# Patient Record
Sex: Female | Born: 1940
Health system: Southern US, Community
[De-identification: ages and names within clinical notes are randomized; demographics above are authoritative.]

## PROBLEM LIST (undated history)

## (undated) DIAGNOSIS — E7800 Pure hypercholesterolemia, unspecified: Secondary | ICD-10-CM

## (undated) DIAGNOSIS — J449 Chronic obstructive pulmonary disease, unspecified: Secondary | ICD-10-CM

## (undated) DIAGNOSIS — T451X5A Adverse effect of antineoplastic and immunosuppressive drugs, initial encounter: Secondary | ICD-10-CM

## (undated) DIAGNOSIS — C539 Malignant neoplasm of cervix uteri, unspecified: Secondary | ICD-10-CM

## (undated) DIAGNOSIS — M1990 Unspecified osteoarthritis, unspecified site: Secondary | ICD-10-CM

## (undated) DIAGNOSIS — C259 Malignant neoplasm of pancreas, unspecified: Secondary | ICD-10-CM

## (undated) DIAGNOSIS — K439 Ventral hernia without obstruction or gangrene: Secondary | ICD-10-CM

## (undated) DIAGNOSIS — D6481 Anemia due to antineoplastic chemotherapy: Secondary | ICD-10-CM

## (undated) DIAGNOSIS — K219 Gastro-esophageal reflux disease without esophagitis: Secondary | ICD-10-CM

## (undated) DIAGNOSIS — I719 Aortic aneurysm of unspecified site, without rupture: Secondary | ICD-10-CM

## (undated) DIAGNOSIS — E538 Deficiency of other specified B group vitamins: Secondary | ICD-10-CM

## (undated) DIAGNOSIS — D649 Anemia, unspecified: Secondary | ICD-10-CM

## (undated) DIAGNOSIS — I10 Essential (primary) hypertension: Secondary | ICD-10-CM

## (undated) DIAGNOSIS — C7800 Secondary malignant neoplasm of unspecified lung: Secondary | ICD-10-CM

## (undated) DIAGNOSIS — Z7189 Other specified counseling: Secondary | ICD-10-CM

## (undated) HISTORY — DX: Malignant neoplasm of pancreas, unspecified: C25.9

## (undated) HISTORY — DX: Anemia due to antineoplastic chemotherapy: D64.81

## (undated) HISTORY — DX: Aortic aneurysm of unspecified site, without rupture: I71.9

## (undated) HISTORY — DX: Other specified counseling: Z71.89

## (undated) HISTORY — PX: THYROID SURGERY: SHX805

## (undated) HISTORY — DX: Deficiency of other specified B group vitamins: E53.8

## (undated) HISTORY — PX: CARDIAC CATHETERIZATION: SHX172

## (undated) HISTORY — DX: Ventral hernia without obstruction or gangrene: K43.9

## (undated) HISTORY — DX: Chronic obstructive pulmonary disease, unspecified: J44.9

## (undated) HISTORY — DX: Secondary malignant neoplasm of unspecified lung: C78.00

## (undated) HISTORY — DX: Adverse effect of antineoplastic and immunosuppressive drugs, initial encounter: T45.1X5A

## (undated) HISTORY — PX: SKIN CANCER EXCISION: SHX779

---
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
## 1961-02-21 DIAGNOSIS — C539 Malignant neoplasm of cervix uteri, unspecified: Secondary | ICD-10-CM

## 1961-02-21 HISTORY — DX: Malignant neoplasm of cervix uteri, unspecified: C53.9

## 1961-02-21 HISTORY — PX: VAGINAL HYSTERECTOMY: SUR661

## 1986-02-21 HISTORY — PX: BREAST CYST EXCISION: SHX579

## 2005-02-21 HISTORY — PX: BUNIONECTOMY: SHX129

## 2005-12-05 ENCOUNTER — Ambulatory Visit: Payer: Self-pay | Admitting: Cardiology

## 2007-02-22 HISTORY — PX: CATARACT EXTRACTION W/ INTRAOCULAR LENS  IMPLANT, BILATERAL: SHX1307

## 2008-08-21 DIAGNOSIS — C259 Malignant neoplasm of pancreas, unspecified: Secondary | ICD-10-CM

## 2008-08-21 DIAGNOSIS — C801 Malignant (primary) neoplasm, unspecified: Secondary | ICD-10-CM

## 2008-08-21 HISTORY — PX: CHOLECYSTECTOMY: SHX55

## 2008-08-21 HISTORY — PX: WHIPPLE PROCEDURE: SHX2667

## 2008-08-21 HISTORY — DX: Malignant neoplasm of pancreas, unspecified: C25.9

## 2008-10-28 ENCOUNTER — Ambulatory Visit (HOSPITAL_COMMUNITY): Admission: RE | Admit: 2008-10-28 | Discharge: 2008-10-28 | Payer: Self-pay | Admitting: Internal Medicine

## 2009-07-08 ENCOUNTER — Ambulatory Visit (HOSPITAL_COMMUNITY): Admission: RE | Admit: 2009-07-08 | Discharge: 2009-07-08 | Payer: Self-pay | Admitting: Internal Medicine

## 2009-12-03 ENCOUNTER — Ambulatory Visit (HOSPITAL_COMMUNITY): Admission: RE | Admit: 2009-12-03 | Discharge: 2009-12-03 | Payer: Self-pay | Admitting: Internal Medicine

## 2009-12-14 ENCOUNTER — Ambulatory Visit: Payer: Self-pay | Admitting: Thoracic Surgery

## 2009-12-24 ENCOUNTER — Inpatient Hospital Stay (HOSPITAL_COMMUNITY): Admission: RE | Admit: 2009-12-24 | Discharge: 2009-12-29 | Payer: Self-pay | Admitting: Thoracic Surgery

## 2009-12-24 ENCOUNTER — Encounter: Payer: Self-pay | Admitting: Thoracic Surgery

## 2009-12-24 ENCOUNTER — Ambulatory Visit: Payer: Self-pay | Admitting: Thoracic Surgery

## 2010-01-06 ENCOUNTER — Ambulatory Visit: Payer: Self-pay | Admitting: Thoracic Surgery

## 2010-01-06 ENCOUNTER — Encounter: Admission: RE | Admit: 2010-01-06 | Discharge: 2010-01-06 | Payer: Self-pay | Admitting: Thoracic Surgery

## 2010-01-27 ENCOUNTER — Ambulatory Visit: Payer: Self-pay | Admitting: Thoracic Surgery

## 2010-03-09 ENCOUNTER — Ambulatory Visit
Admission: RE | Admit: 2010-03-09 | Discharge: 2010-03-09 | Payer: Self-pay | Source: Home / Self Care | Attending: Thoracic Surgery | Admitting: Thoracic Surgery

## 2010-03-09 ENCOUNTER — Encounter
Admission: RE | Admit: 2010-03-09 | Discharge: 2010-03-09 | Payer: Self-pay | Source: Home / Self Care | Attending: Thoracic Surgery | Admitting: Thoracic Surgery

## 2010-03-13 ENCOUNTER — Encounter: Payer: Self-pay | Admitting: Thoracic Surgery

## 2010-03-14 ENCOUNTER — Encounter: Payer: Self-pay | Admitting: Thoracic Surgery

## 2010-05-04 LAB — BASIC METABOLIC PANEL
BUN: 16 mg/dL (ref 6–23)
BUN: 8 mg/dL (ref 6–23)
CO2: 27 mEq/L (ref 19–32)
CO2: 29 mEq/L (ref 19–32)
Chloride: 101 mEq/L (ref 96–112)
Chloride: 102 mEq/L (ref 96–112)
Chloride: 99 mEq/L (ref 96–112)
Creatinine, Ser: 0.82 mg/dL (ref 0.4–1.2)
Creatinine, Ser: 0.89 mg/dL (ref 0.4–1.2)
GFR calc Af Amer: >60 mL/min (ref >60)
Glucose, Bld: 113 mg/dL — ABNORMAL HIGH (ref 70–99)
Potassium: 3.6 mEq/L (ref 3.5–5.1)
Potassium: 3.6 mEq/L (ref 3.5–5.1)
Potassium: 4.2 mEq/L (ref 3.5–5.1)

## 2010-05-04 LAB — TYPE AND SCREEN
ABO/RH(D): A POS
Antibody Screen: NEGATIVE

## 2010-05-04 LAB — GLUCOSE, CAPILLARY
Glucose-Capillary: 119 mg/dL — ABNORMAL HIGH (ref 70–99)
Glucose-Capillary: 126 mg/dL — ABNORMAL HIGH (ref 70–99)
Glucose-Capillary: 132 mg/dL — ABNORMAL HIGH (ref 70–99)
Glucose-Capillary: 132 mg/dL — ABNORMAL HIGH (ref 70–99)
Glucose-Capillary: 132 mg/dL — ABNORMAL HIGH (ref 70–99)
Glucose-Capillary: 133 mg/dL — ABNORMAL HIGH (ref 70–99)
Glucose-Capillary: 144 mg/dL — ABNORMAL HIGH (ref 70–99)
Glucose-Capillary: 147 mg/dL — ABNORMAL HIGH (ref 70–99)
Glucose-Capillary: 163 mg/dL — ABNORMAL HIGH (ref 70–99)
Glucose-Capillary: 191 mg/dL — ABNORMAL HIGH (ref 70–99)
Glucose-Capillary: 195 mg/dL — ABNORMAL HIGH (ref 70–99)

## 2010-05-04 LAB — CBC
HCT: 26.8 % — ABNORMAL LOW (ref 36.0–46.0)
HCT: 28.1 % — ABNORMAL LOW (ref 36.0–46.0)
HCT: 30.2 % — ABNORMAL LOW (ref 36.0–46.0)
Hemoglobin: 10.2 g/dL — ABNORMAL LOW (ref 12.0–15.0)
Hemoglobin: 9.3 g/dL — ABNORMAL LOW (ref 12.0–15.0)
MCH: 29.7 pg (ref 26.0–34.0)
MCH: 29.8 pg (ref 26.0–34.0)
MCH: 29.8 pg (ref 26.0–34.0)
MCH: 30.8 pg (ref 26.0–34.0)
MCHC: 33.1 g/dL (ref 30.0–36.0)
MCHC: 34 g/dL (ref 30.0–36.0)
MCV: 88 fL (ref 78.0–100.0)
MCV: 89.3 fL (ref 78.0–100.0)
MCV: 89.8 fL (ref 78.0–100.0)
MCV: 90.1 fL (ref 78.0–100.0)
Platelets: 103 10*3/uL — ABNORMAL LOW (ref 150–400)
Platelets: 130 10*3/uL — ABNORMAL LOW (ref 150–400)
Platelets: 91 10*3/uL — ABNORMAL LOW (ref 150–400)
RBC: 3.12 MIL/uL — ABNORMAL LOW (ref 3.87–5.11)
RBC: 3.43 MIL/uL — ABNORMAL LOW (ref 3.87–5.11)
RDW: 13.5 % (ref 11.5–15.5)
RDW: 13.6 % (ref 11.5–15.5)
RDW: 14.1 % (ref 11.5–15.5)
RDW: 14.2 % (ref 11.5–15.5)
WBC: 11.5 10*3/uL — ABNORMAL HIGH (ref 4.0–10.5)
WBC: 12 10*3/uL — ABNORMAL HIGH (ref 4.0–10.5)
WBC: 8.7 10*3/uL (ref 4.0–10.5)

## 2010-05-04 LAB — COMPREHENSIVE METABOLIC PANEL
ALT: 11 U/L (ref 0–35)
AST: 17 U/L (ref 0–37)
Albumin: 3.9 g/dL (ref 3.5–5.2)
BUN: 19 mg/dL (ref 6–23)
Calcium: 8.8 mg/dL (ref 8.4–10.5)
Calcium: 9.3 mg/dL (ref 8.4–10.5)
Chloride: 109 mEq/L (ref 96–112)
Creatinine, Ser: 0.95 mg/dL (ref 0.4–1.2)
Creatinine, Ser: 1.21 mg/dL — ABNORMAL HIGH (ref 0.4–1.2)
GFR calc Af Amer: 53 mL/min — ABNORMAL LOW (ref >60)
GFR calc Af Amer: >60 mL/min (ref >60)
Sodium: 134 mEq/L — ABNORMAL LOW (ref 135–145)
Total Bilirubin: 0.5 mg/dL (ref 0.3–1.2)
Total Protein: 5.7 g/dL — ABNORMAL LOW (ref 6.0–8.3)

## 2010-05-04 LAB — BLOOD GAS, ARTERIAL
Bicarbonate: 24.8 mEq/L — ABNORMAL HIGH (ref 20.0–24.0)
Patient temperature: 98.6
TCO2: 25.9 mmol/L (ref 0–100)
pH, Arterial: 7.437 — ABNORMAL HIGH (ref 7.350–7.400)

## 2010-05-04 LAB — POCT I-STAT 3, ART BLOOD GAS (G3+)
Acid-base deficit: 1 mmol/L (ref 0.0–2.0)
Bicarbonate: 23.6 mEq/L (ref 20.0–24.0)
O2 Saturation: 88 %
TCO2: 25 mmol/L (ref 0–100)
pO2, Arterial: 56 mmHg — ABNORMAL LOW (ref 80.0–100.0)

## 2010-05-04 LAB — URINALYSIS, ROUTINE W REFLEX MICROSCOPIC
Glucose, UA: NEGATIVE mg/dL
Ketones, ur: NEGATIVE mg/dL
Protein, ur: NEGATIVE mg/dL
Urobilinogen, UA: 1 mg/dL (ref 0.0–1.0)

## 2010-05-04 LAB — MRSA PCR SCREENING: MRSA by PCR: NEGATIVE

## 2010-05-04 LAB — URINE MICROSCOPIC-ADD ON

## 2010-05-04 LAB — PROTIME-INR: INR: 1.03 (ref 0.00–1.49)

## 2010-05-04 LAB — SURGICAL PCR SCREEN: Staphylococcus aureus: POSITIVE — AB

## 2010-05-04 LAB — APTT: aPTT: 28 seconds (ref 24–37)

## 2010-05-06 LAB — GLUCOSE, CAPILLARY: Glucose-Capillary: 106 mg/dL — ABNORMAL HIGH (ref 70–99)

## 2010-05-26 ENCOUNTER — Ambulatory Visit (INDEPENDENT_AMBULATORY_CARE_PROVIDER_SITE_OTHER): Payer: Medicare Other | Admitting: Thoracic Surgery

## 2010-05-26 DIAGNOSIS — D381 Neoplasm of uncertain behavior of trachea, bronchus and lung: Secondary | ICD-10-CM

## 2010-05-27 NOTE — Letter (Signed)
May 26, 2010  Boris M. Ubaldo Glassing, M.D. [address] [place], [place] [address]  Re:  [name] [name], [name] [name]              DOB:  October 28, 1940  Dear Dr. Ubaldo Glassing,  I saw [name] back in the office today and reviewed her CT scan. There is no evidence of recurrence of her metastatic pancreatic cancer to her right lung, where we had removed 3 nodules; however, on the left side the left lower lobe nodule despite Xeloda and Gemzar chemotherapy has increased from 1 cm to 2.4 cm.  There is another small nodule in the left lower lobe that is still 8 mm that is unchanged.  I plan to proceed with a left VATS and wedge resection of the left lower lobe lesion and we will also take the stable nodule out at the time.  I explained the situation to her and she agrees to the surgery.  We will get pulmonary function tests on her prior to her surgery.  Her blood pressure is 148/83, pulse 74, respirations 18, sats were 96%.  Ines Bloomer, M.D. Electronically Signed  DPB/MEDQ  D:  05/26/2010  T:  05/27/2010  Job:  [id number]

## 2010-05-28 LAB — GLUCOSE, CAPILLARY: Glucose-Capillary: 115 mg/dL — ABNORMAL HIGH (ref 70–99)

## 2010-06-01 ENCOUNTER — Ambulatory Visit (HOSPITAL_COMMUNITY)
Admission: RE | Admit: 2010-06-01 | Discharge: 2010-06-01 | Disposition: A | Discharge status: Home / Self Care | Payer: Medicare Other | Source: Ambulatory Visit | Attending: Thoracic Surgery | Admitting: Thoracic Surgery

## 2010-06-01 ENCOUNTER — Other Ambulatory Visit: Payer: Self-pay | Admitting: Thoracic Surgery

## 2010-06-01 ENCOUNTER — Encounter (HOSPITAL_COMMUNITY)
Admission: RE | Admit: 2010-06-01 | Discharge: 2010-06-01 | Disposition: A | Discharge status: Home / Self Care | Payer: Medicare Other | Source: Ambulatory Visit | Attending: Thoracic Surgery | Admitting: Thoracic Surgery

## 2010-06-01 DIAGNOSIS — R911 Solitary pulmonary nodule: Secondary | ICD-10-CM

## 2010-06-01 DIAGNOSIS — J984 Other disorders of lung: Secondary | ICD-10-CM | POA: Insufficient documentation

## 2010-06-01 LAB — BLOOD GAS, ARTERIAL
Drawn by: 181601
FIO2: 0.21 %
O2 Saturation: 97.1 %
Patient temperature: 98.6
pH, Arterial: 7.423 — ABNORMAL HIGH (ref 7.350–7.400)
pO2, Arterial: 104 mmHg — ABNORMAL HIGH (ref 80.0–100.0)

## 2010-06-01 LAB — URINALYSIS, ROUTINE W REFLEX MICROSCOPIC
Bilirubin Urine: NEGATIVE
Glucose, UA: NEGATIVE mg/dL
Ketones, ur: NEGATIVE mg/dL
Protein, ur: NEGATIVE mg/dL

## 2010-06-01 LAB — COMPREHENSIVE METABOLIC PANEL
ALT: 15 U/L (ref 0–35)
AST: 22 U/L (ref 0–37)
Alkaline Phosphatase: 102 U/L (ref 39–117)
CO2: 22 mEq/L (ref 19–32)
Calcium: 9.4 mg/dL (ref 8.4–10.5)
Chloride: 109 mEq/L (ref 96–112)
GFR calc Af Amer: >60 mL/min (ref >60)
GFR calc non Af Amer: >60 mL/min (ref >60)
Potassium: 4.1 mEq/L (ref 3.5–5.1)
Sodium: 140 mEq/L (ref 135–145)

## 2010-06-01 LAB — TYPE AND SCREEN: Antibody Screen: NEGATIVE

## 2010-06-01 LAB — PROTIME-INR
INR: 1.07 (ref 0.00–1.49)
Prothrombin Time: 14.1 seconds (ref 11.6–15.2)

## 2010-06-01 LAB — CBC
MCH: 32.1 pg (ref 26.0–34.0)
MCHC: 33.4 g/dL (ref 30.0–36.0)
Platelets: 121 10*3/uL — ABNORMAL LOW (ref 150–400)
RDW: 12.9 % (ref 11.5–15.5)

## 2010-06-01 LAB — URINE MICROSCOPIC-ADD ON

## 2010-06-03 ENCOUNTER — Inpatient Hospital Stay (HOSPITAL_COMMUNITY)
Admission: RE | Admit: 2010-06-03 | Discharge: 2010-06-07 | DRG: 164 | Disposition: A | Discharge status: Home / Self Care | Payer: Medicare Other | Source: Ambulatory Visit | Attending: Thoracic Surgery | Admitting: Thoracic Surgery

## 2010-06-03 ENCOUNTER — Other Ambulatory Visit: Payer: Self-pay | Admitting: Thoracic Surgery

## 2010-06-03 ENCOUNTER — Inpatient Hospital Stay (HOSPITAL_COMMUNITY): Payer: Medicare Other

## 2010-06-03 DIAGNOSIS — C259 Malignant neoplasm of pancreas, unspecified: Secondary | ICD-10-CM | POA: Diagnosis present

## 2010-06-03 DIAGNOSIS — C7800 Secondary malignant neoplasm of unspecified lung: Principal | ICD-10-CM | POA: Diagnosis present

## 2010-06-03 DIAGNOSIS — J449 Chronic obstructive pulmonary disease, unspecified: Secondary | ICD-10-CM | POA: Diagnosis present

## 2010-06-03 DIAGNOSIS — I10 Essential (primary) hypertension: Secondary | ICD-10-CM | POA: Diagnosis present

## 2010-06-03 DIAGNOSIS — J4489 Other specified chronic obstructive pulmonary disease: Secondary | ICD-10-CM | POA: Diagnosis present

## 2010-06-03 DIAGNOSIS — E785 Hyperlipidemia, unspecified: Secondary | ICD-10-CM | POA: Diagnosis present

## 2010-06-03 DIAGNOSIS — Z87891 Personal history of nicotine dependence: Secondary | ICD-10-CM

## 2010-06-03 DIAGNOSIS — D381 Neoplasm of uncertain behavior of trachea, bronchus and lung: Secondary | ICD-10-CM

## 2010-06-03 DIAGNOSIS — Z7982 Long term (current) use of aspirin: Secondary | ICD-10-CM

## 2010-06-03 DIAGNOSIS — D696 Thrombocytopenia, unspecified: Secondary | ICD-10-CM | POA: Diagnosis present

## 2010-06-03 LAB — GLUCOSE, CAPILLARY: Glucose-Capillary: 161 mg/dL — ABNORMAL HIGH (ref 70–99)

## 2010-06-04 ENCOUNTER — Inpatient Hospital Stay (HOSPITAL_COMMUNITY): Payer: Medicare Other

## 2010-06-04 LAB — CBC
MCV: 95.6 fL (ref 78.0–100.0)
Platelets: 91 10*3/uL — ABNORMAL LOW (ref 150–400)
RBC: 3.16 MIL/uL — ABNORMAL LOW (ref 3.87–5.11)
WBC: 8.1 10*3/uL (ref 4.0–10.5)

## 2010-06-04 LAB — GLUCOSE, CAPILLARY
Glucose-Capillary: 152 mg/dL — ABNORMAL HIGH (ref 70–99)
Glucose-Capillary: 124 mg/dL — ABNORMAL HIGH (ref 70–99)
Glucose-Capillary: 194 mg/dL — ABNORMAL HIGH (ref 70–99)

## 2010-06-04 LAB — POCT I-STAT 3, ART BLOOD GAS (G3+)
Bicarbonate: 27.5 mEq/L — ABNORMAL HIGH (ref 20.0–24.0)
TCO2: 29 mmol/L (ref 0–100)
pCO2 arterial: 43.1 mmHg (ref 35.0–45.0)
pH, Arterial: 7.413 — ABNORMAL HIGH (ref 7.350–7.400)

## 2010-06-04 LAB — BASIC METABOLIC PANEL
BUN: 10 mg/dL (ref 6–23)
Chloride: 108 mEq/L (ref 96–112)
Potassium: 3.4 mEq/L — ABNORMAL LOW (ref 3.5–5.1)
Sodium: 137 mEq/L (ref 135–145)

## 2010-06-05 ENCOUNTER — Inpatient Hospital Stay (HOSPITAL_COMMUNITY): Payer: Medicare Other

## 2010-06-05 LAB — COMPREHENSIVE METABOLIC PANEL
CO2: 28 mEq/L (ref 19–32)
Calcium: 8.9 mg/dL (ref 8.4–10.5)
Chloride: 105 mEq/L (ref 96–112)
Creatinine, Ser: 0.97 mg/dL (ref 0.4–1.2)
GFR calc non Af Amer: 56 mL/min — ABNORMAL LOW (ref >60)
Glucose, Bld: 118 mg/dL — ABNORMAL HIGH (ref 70–99)
Total Bilirubin: 0.5 mg/dL (ref 0.3–1.2)

## 2010-06-05 LAB — CBC
HCT: 28.4 % — ABNORMAL LOW (ref 36.0–46.0)
Hemoglobin: 9.3 g/dL — ABNORMAL LOW (ref 12.0–15.0)
MCH: 31.6 pg (ref 26.0–34.0)
MCHC: 32.7 g/dL (ref 30.0–36.0)
MCV: 96.6 fL (ref 78.0–100.0)
RBC: 2.94 MIL/uL — ABNORMAL LOW (ref 3.87–5.11)

## 2010-06-05 LAB — GLUCOSE, CAPILLARY: Glucose-Capillary: 137 mg/dL — ABNORMAL HIGH (ref 70–99)

## 2010-06-06 ENCOUNTER — Inpatient Hospital Stay (HOSPITAL_COMMUNITY): Payer: Medicare Other

## 2010-06-06 LAB — GLUCOSE, CAPILLARY
Glucose-Capillary: 183 mg/dL — ABNORMAL HIGH (ref 70–99)
Glucose-Capillary: 153 mg/dL — ABNORMAL HIGH (ref 70–99)

## 2010-06-07 ENCOUNTER — Inpatient Hospital Stay (HOSPITAL_COMMUNITY): Payer: Medicare Other

## 2010-06-07 LAB — GLUCOSE, CAPILLARY
Glucose-Capillary: 107 mg/dL — ABNORMAL HIGH (ref 70–99)
Glucose-Capillary: 139 mg/dL — ABNORMAL HIGH (ref 70–99)

## 2010-06-09 NOTE — Discharge Summary (Signed)
NAME:  [name] [name], [name] [name] NO.:  [id number]  MEDICAL RECORD NO.:  [id number]           PATIENT TYPE:  I  LOCATION:  3303                         FACILITY:  [facility]  PHYSICIAN:  Ines Bloomer, M.D. DATE OF BIRTH:  [birth year]  DATE OF ADMISSION:  06/03/2010 DATE OF DISCHARGE:  06/07/2010                              DISCHARGE SUMMARY   FINAL DIAGNOSES: 1. Enlarging left lower lobe lesion. 2. History of metastatic adenocarcinoma from the pancreas.  SECONDARY DIAGNOSES: 1. History of adenocarcinoma of the right upper and right lower lobes     consistent with metastatic adenocarcinoma. 2. History of pancreatic cancer status post Whipple procedure done by     Dr. Eustace Moore approximately November 2010. 3. History of small abdominal aortic aneurysm. 4. Ventral hernia status post Whipple. 5. Hyperlipidemia. 6. Chronic obstructive pulmonary disease. 7. Hypertension. 8. History of tobacco abuse. 9. Status post right VATS with right upper lobe and right lower lobe     wedge resection.  IN-HOSPITAL OPERATIONS AND PROCEDURES:  Left video-assisted thoracoscopic surgery with left mini thoracotomy, wedge resection left lower lobe x4.  THE PATIENT'S HISTORY AND PHYSICAL AND HOSPITAL COURSE:  The patient is a 70 year old female who had a Whipple procedure done a year and a half ago and found to have multiple nodules in the lung with 8 to 10 mm in the right lower lobe and left lower lobe.  In October 2011, she underwent right VATS and wedge resection 3 nodules in the right side that turned out to be metastatic adenocarcinoma.  The patient was placed on chemotherapy by Dr. Derald Macleod including Xeloda.  Her CA-19 was normal. Unfortunately the left lower lobe lesion had progressed up to about 2 cm and there is another small stable lesion 6 to 8 mm in the lateral segment of the left lower lobe.  The patient had been seen and evaluated by Dr. Edwyna Shell and Dr. Edwyna Shell  discussed the patient undergoing a left VATS with resection of these nodules.  He discussed risks and benefits with the patient.  The patient acknowledged understanding and agreed to proceed.  Surgery was scheduled for June 03, 2010.  For further details of the patient's past medical history and physical exam, please see dictated H and P.  The patient was taken to the operating room on June 03, 2010, where she underwent left video-assisted thoracoscopic surgery with left mini thoracotomy, wedge resection left lower lobe x4.  This came back positive for adenocarcinoma consistent with metastatic features.  The patient tolerated this procedure well and was transferred to the intensive care unit in stable condition.  She was able to be extubated following surgery.  Postextubation she was noted to be alert and oriented x4.  Neuro intact.  The patient was noted to be hemodynamically stable postoperatively.  During the patient's postoperative course, daily chest x-rays were obtained.  The patient had no air leak from chest tube.  One chest tube was able to be discontinued postop day #2. Remaining chest tube placed to water seal.  This showed no air leaks. Chest x-ray showed tiny left apical pneumothorax.  Remaining chest tube  was discontinued postop day #3.  Following x-ray PA and lateral on June 07, 2010, showed stable tiny left apical pneumothorax with persistent atelectasis.  During this time, the patient was encouraged to use her incentive spirometer.  She is saturating greater than 90% on room air at rest and with ambulation into the low 80s.  It is felt due to desaturation with ambulation, the patient will require home oxygen. This was set up prior to discharge.  The patient will require 2 L oxygen with ambulation.  She is to continue using her incentive spirometer. Home health nurse was also set up.  During this time, the patient has remained afebrile.  She has remained in normal  sinus rhythm.  Blood pressure stable.  All incisions clean, dry, and intact and healing well. She is tolerating diet well.  No nausea, vomiting noted.  All incisions are clean, dry, and intact and healing well.  Most recent lab work shows sodium of 139, potassium 4.0, chloride of 105, bicarbonate 20, BUN 9, creatinine 0.97, glucose 118.  White blood cell count 8.4, hemoglobin 9.3, hematocrit 20.4, platelet count 92.  The patient is felt to be stable and ready for discharge to home today with oxygen.  FOLLOWUP APPOINTMENTS:  A followup appointment will be arranged with Dr. Edwyna Shell for 1 week.  Our office will contact the patient with this information.  She will need to obtain PA and lateral chest x-ray 45 minutes prior to this appointment.  ACTIVITY:  The patient instructed no driving until released to do so, no lifting over 10 pounds.  She is told to ambulate 3-4 times per day, progress as tolerated, and continue her breathing exercises.  INCISIONAL CARE:  The patient is told to shower washing her incisions using soap and water.  She is to contact the office if she develops any drainage or opening from any of her incision sites.  DIET:  The patient educated on diet to be low-fat, low-salt.  DISCHARGE MEDICATIONS: 1. Percocet 5/325 one to two tablets q.4-6 h. p.r.n. pain. 2. Norvasc 5 mg daily. 3. Enteric-coated aspirin 81 mg daily. 4. Losartan 50 mg daily. 5. Metoprolol XL 50 mg half tablet daily. 6. Prilosec 20 mg b.i.d. 7. Simvastatin 20 mg daily at night. 8. Stool softener daily p.r.n.     Sol Blazing, PA   ______________________________ Ines Bloomer, M.D.    KMD/MEDQ  D:  06/07/2010  T:  06/08/2010  Job:  [id number]  Electronically Signed by Cameron Proud PA on 06/08/2010 01:20:53 PM Electronically Signed by Jovita Gamma M.D. on 06/09/2010 02:15:02 PM

## 2010-06-09 NOTE — H&P (Signed)
NAME:  LOUKISHA, GUNNERSON NO.:  [id number]  MEDICAL RECORD NO.:  [id number]           PATIENT TYPE:  O  LOCATION:  XRAY                         FACILITY:  [facility]  PHYSICIAN:  Ines Bloomer, M.D. DATE OF BIRTH:  08/15/1940  DATE OF ADMISSION:  06/01/2010 DATE OF DISCHARGE:  06/01/2010                             HISTORY & PHYSICAL   CHIEF COMPLAINT:  Enlarging left lower lobe lesion.  HISTORY OF PRESENT ILLNESS:  This 70 year old Caucasian female had a Whipple procedure done in a year and a half ago, was found to have multiple nodules in the lung with 8-10 mm in the right lower lobe and left lower lobe nodule.  In October 2011, she underwent a right VATS and resection of 3 nodules in the right side that turned out to be metastatic adenocarcinoma.  She was placed on chemotherapy by Dr. Derald Macleod including Xeloda and unfortunately her CA-19 was normal. Unfortunately the left lower lobe lesion had progressed up to about 2 cm and there is another small stable lesion of 6-8 mm in the lateral segment of the left lower lobe.  She is being admitted for resection of the left lower lobe lesion.  PAST MEDICAL HISTORY:  Significant for abdominal wall hernia, small aortic aneurysm.  Her pulmonary function tests showed an FVC of 3.05 with a FEV-1 of 2.3 or 95% predicted and a corrected diffusion capacity of 48%.  Her medications include Prilosec, Toprol, Zocor, Norvasc, stool softener, losartan, aspirin.  She has no allergies.  She also has a dyslipidemia, chronic obstructive pulmonary disease, and hypertension.  FAMILY HISTORY:  Positive for pancreatic cancer.  Brother died of pancreatic cancer.  SOCIAL HISTORY:  She has no children.  She quit smoking in 1996.  Does not drink alcohol on a regular basis.  REVIEW OF SYSTEMS:  She is 5 feet 5 inches, 154 pounds, her weight has been stable.  CARDIAC:  There is no angina or atrial fibrillation. PULMONARY:  There is no  hemoptysis.  PULMONARY:  There is no hemoptysis or bronchitis.  GI:  See history of present illness.  GU:  No kidney disease, dysuria, or frequent urination.  VASCULAR:  No claudication, DVT, or TIAs.  NEUROLOGICAL:  No dizziness, headaches, blackouts, or seizures.  MUSCULOSKELETAL:  Arthritis.  PSYCHIATRIC:  No depression or nervousness.  ENT:  No change in eyesight or hearing.  HEMATOLOGICAL: No problems with bleeding, clotting disorders, or anemia.  PHYSICAL EXAMINATION:  GENERAL:  She is a well-developed Caucasian female in no acute distress. VITAL SIGNS:  Blood pressure is 148/83, pulse 74, respirations 20, sats were 96%. HEAD:  Atraumatic. EYES:  Pupils equal and reactive to light and accommodation. Extraocular movements normal. EARS:  Tympanic membranes are cracked. NOSE:  There is no septal deviation. THROAT:  Without lesion.  Uvula is in midline. NECK:  Supple without thyromegaly.  No supraclavicular or axillary adenopathy. CHEST:  Clear to auscultation and percussion. HEART:  Regular sinus rhythm.  No murmurs. ABDOMEN:  Soft.  There is no hepatosplenomegaly. EXTREMITIES:  Pulse is 2+.  There is no clubbing or edema. NEUROLOGICAL:  She is oriented x3.  Sensory and motor intact.  Cranial nerves intact.  IMPRESSION:  Status post Whipple disease versus pancreatic cancer, metastatic adenocarcinoma of the pancreas to the right status post resection, enlarging left lower lobe lesion, hypercholesterolemia, chronic obstructive pulmonary disease, hypertension.  PLAN:  Left VATS, resection of 2 left lower lobe nodules.     Ines Bloomer, M.D.     DPB/MEDQ  D:  06/01/2010  T:  06/02/2010  Job:  [id number]  Electronically Signed by Jovita Gamma M.D. on 06/09/2010 02:14:51 PM

## 2010-06-09 NOTE — Op Note (Signed)
  NAME:  [name] [name], [name] [name] NO.:  [id number]  MEDICAL RECORD NO.:  [id number]           PATIENT TYPE:  LOCATION:                                 FACILITY:  PHYSICIAN:  Ines Bloomer, M.D. DATE OF BIRTH:  05/31/1940  DATE OF PROCEDURE: DATE OF DISCHARGE:                              OPERATIVE REPORT   PREOPERATIVE DIAGNOSIS:  Enlarging left lower lobe lesion, history of metastatic adenocarcinoma from the pancreas.  POSTOPERATIVE DIAGNOSIS:  Enlarging left lower lobe lesion, history of metastatic adenocarcinoma from the pancreas.  OPERATION PERFORMED:  Left video-assisted thoracoscopic surgery, resection of left lower lobe lesion.  After general anesthesia, the patient was turned to the left lateral thoracotomy position.  Dual-lumen tube was inserted and the left lung was deflated.  Two trocar sites were made in the anterior and posterior axillary line at the seventh intercostal space and the midaxillary lineat the sixth intercostal space.  Two trocars were inserted inferiorly and a 0-degree scope was inserted.  The lesion was seen in the posterolateral segment of the left lower lobe, and we grasped it through the anterior trocar site with a lung clamp and then came in posteriorly with Covidien stapler and resected the lesion.  The patient had two other small lesions and one other small lesion in the lung and we palpated those and they appeared to be intrapulmonary lymph nodes but we resected both of those again grasping with [facility] lung clamp and then resected it with the Covidien 45 stapler.  The margin unfortunately came back positive or a questionable positive, so we took a secondary margin with Covidien stapler another 1.5 to 2 cm.  A 24 chest tube was brought through the anterior trocar site and sutured in place with 0 silk.  A 28 right-angle was brought through the posterior trocar sites and sutured in place with 0 silk.  A Marcaine block was then used in  the usual fashion.  A single On-Q was inserted in the usual fashion.  The chest was closed with two pericostal drilling through the seventh rib and passed around the sixth rib with #1 Vicryl in the muscle layer and Dermabond for the skin.  The patient was returned to recovery room in stable condition.     Ines Bloomer, M.D.     DPB/MEDQ  D:  06/03/2010  T:  06/03/2010  Job:  [id number]  Electronically Signed by Jovita Gamma M.D. on 06/09/2010 02:14:54 PM

## 2010-06-11 DIAGNOSIS — C3490 Malignant neoplasm of unspecified part of unspecified bronchus or lung: Secondary | ICD-10-CM | POA: Insufficient documentation

## 2010-06-14 ENCOUNTER — Other Ambulatory Visit: Payer: Self-pay | Admitting: Thoracic Surgery

## 2010-06-14 DIAGNOSIS — D381 Neoplasm of uncertain behavior of trachea, bronchus and lung: Secondary | ICD-10-CM

## 2010-06-15 ENCOUNTER — Ambulatory Visit (INDEPENDENT_AMBULATORY_CARE_PROVIDER_SITE_OTHER): Payer: Self-pay | Admitting: Thoracic Surgery

## 2010-06-15 ENCOUNTER — Ambulatory Visit
Admission: RE | Admit: 2010-06-15 | Discharge: 2010-06-15 | Disposition: A | Discharge status: Home / Self Care | Payer: Medicare Other | Source: Ambulatory Visit | Attending: Thoracic Surgery | Admitting: Thoracic Surgery

## 2010-06-15 DIAGNOSIS — C3490 Malignant neoplasm of unspecified part of unspecified bronchus or lung: Secondary | ICD-10-CM

## 2010-06-15 DIAGNOSIS — D381 Neoplasm of uncertain behavior of trachea, bronchus and lung: Secondary | ICD-10-CM

## 2010-06-16 NOTE — Assessment & Plan Note (Signed)
OFFICE VISIT  PARILEE, HALLY DOB:  1940/10/21                                        June 15, 2010 CHART #:  [id number]  The patient came today.  Her blood pressure is 150/83, pulse 70, respirations 18, sats were 93%.  She is doing well overall.  Her incisions are well healed, removed her chest tube sutures.  The pathology on the left side was questionable, lung cancer and they are doing some extra studies and I do not have the final path report and they want to compare this with their previous studies at [facility].  We told her that she can start driving next week and gradually increase her activities and I will see her back again in 3 weeks with a chest x-ray.  Ines Bloomer, M.D. Electronically Signed  DPB/MEDQ  D:  06/15/2010  T:  06/16/2010  Job:  [id number]

## 2010-07-05 ENCOUNTER — Other Ambulatory Visit: Payer: Self-pay | Admitting: Thoracic Surgery

## 2010-07-05 DIAGNOSIS — C3430 Malignant neoplasm of lower lobe, unspecified bronchus or lung: Secondary | ICD-10-CM

## 2010-07-06 ENCOUNTER — Ambulatory Visit (INDEPENDENT_AMBULATORY_CARE_PROVIDER_SITE_OTHER): Payer: Self-pay | Admitting: Thoracic Surgery

## 2010-07-06 ENCOUNTER — Ambulatory Visit
Admission: RE | Admit: 2010-07-06 | Discharge: 2010-07-06 | Disposition: A | Discharge status: Home / Self Care | Payer: Medicare Other | Source: Ambulatory Visit | Attending: Thoracic Surgery | Admitting: Thoracic Surgery

## 2010-07-06 DIAGNOSIS — C3490 Malignant neoplasm of unspecified part of unspecified bronchus or lung: Secondary | ICD-10-CM

## 2010-07-06 DIAGNOSIS — C3430 Malignant neoplasm of lower lobe, unspecified bronchus or lung: Secondary | ICD-10-CM

## 2010-07-06 NOTE — Letter (Signed)
December 15, 2009   Normand Sloop, MD  [address]  [place], [place]  [address]   Re:  [name] [name], [name] [name]              DOB:  1940/09/02   Dear Dr. Derald Macleod:   This 70 year old Caucasian female was referred here because of changes  on her CT scan.  She has a significant history of having pancreatic  cancer after developing jaundice and underwent Whipple procedure by Dr.  Eustace Moore approximately 1 year ago.  Recent CT scan showed  multiple small nodules in her lungs, largest being about 8 mm in the  right lower lobe and the left lower lobe showed some cavitation.  PET  scan was positive from these lesions, shows no evidence of any other  recurrence of her cancer.  She is referred here for possible biopsy  because of her positive PET scan.  Her CA19-9 is normal and her liver  function tests were also normal.  There is no other area of uptake other  than in her PET scan.  On November 26, 2009, there was also a question of  some small liver metastasis.   PAST MEDICAL HISTORY:  She has abdominal wall hernia secondary to her  surgery and a small abdominal aortic aneurysm.  These lesions are too  small to be biopsied by interventional radiology.   MEDICATIONS:  Prilosec 40 mg a day, Toprol 50 mg half tablet daily,  Zocor, and Norvasc 5 mg a day.   ALLERGIES:  She has no allergies.   She has dyslipidemia, hypercholesteremia, chronic obstructive pulmonary  disease, and hypertension.   FAMILY HISTORY:  Positive for pancreatic cancer.  Her brother died of  pancreatic cancer.   SOCIAL HISTORY:  She is married, has no children.  Comes in today with  her sister.  Quit smoking in 1996.  Does not drink alcohol.   REVIEW OF SYSTEMS:  She is 154 pounds.  She is 5 feet 5 inches.  GENERAL:  Her weight is stable.  CARDIAC:  No angina or atrial fibrillation.  PULMONARY:  No hemoptysis, bronchitis.  GI:  See history of present illness.  GU:  No kidney disease, dysuria or frequent  urination.  VASCULAR:  No claudication, DVT or TIAs.  NEUROLOGICAL:  No dizziness, headaches, blackouts.  MUSCULOSKELETAL:  No arthritis.  PSYCHIATRIC:  No depression or nervousness.  EYE/ENT:  No change in eyesight or hearing.  HEMATOLOGICAL:  No problems with bleeding, clotting disorders or anemia.   PHYSICAL EXAMINATION:  She is a well-developed Caucasian female in no  acute distress.  Her blood pressure is 190/95, pulse 74, respirations  18, saturations were 98%.  Head, Eyes, Ears, Nose and Throat:  Unremarkable.  Neck:  Supple without thyromegaly.  There is no  supraclavicular or axillary adenopathy.  Chest:  Clear to auscultation  and percussion.  Heart:  Regular sinus rhythm.  Abdomen:  There is a  ventral hernia and surgical scars.  Bowel sounds are normal.  Extremities:  Pulses are 2+.  There is no clubbing or edema.  Neurological:  She is oriented x3.  Sensory and motor intact.  Cranial  nerves are intact.   I feel that these lesions are very small and the only way to retain that  is with a VATS biopsy.  Plan to do a right VATS on her on November 11 at  [facility].  Discussed the risk and benefits of the procedure and she  agrees to  procedure.  We will inform you of her operative findings   Ines Bloomer, M.D.  Electronically Signed   DPB/MEDQ  D:  12/15/2009  T:  12/16/2009  Job:  [id number]

## 2010-07-06 NOTE — Assessment & Plan Note (Signed)
OFFICE VISIT   [name], [name] [name]  DOB:  01/22/1941                                        March 09, 2010  CHART #:  [id number]   The patient is undergoing chemotherapy for metastatic adenocarcinoma of  the colon.  Her VATS incisions are well healed.  Her chest x-ray is  stable.  Her blood pressure was 154/76, pulse 69, respirations 16, sats  were 97%.  I released to full activity.  She looks great.  We will be  happy to see her again if she has any future problems.   Ines Bloomer, M.D.  Electronically Signed   DPB/MEDQ  D:  03/09/2010  T:  03/10/2010  Job:  [id number]

## 2010-07-06 NOTE — Letter (Signed)
January 06, 2010   [name] [name]. Darovsky, M.D.  [address] [place], [place] [address]   Re:  [name], [name]              DOB:  14-Jan-1941   Dear Dr. Ubaldo Glassing:   The patient came to the office today.  Her blood pressure was 144/79,  pulse 80, respirations 18, saturations were 96%.  Her chest x-ray showed  resolution of her effusions.  Her incisions are well healed.  Removed  her chest tube sutures and told to gradually increase her activities.  I  will see her back in 3 weeks with a chest x-ray and she will see Dr.  Ubaldo Glassing in the near future.   Ines Bloomer, M.D.  Electronically Signed   DPB/MEDQ  D:  01/06/2010  T:  01/06/2010  Job:  [id number]   cc:   Kirstie Peri, MD

## 2010-07-07 NOTE — Assessment & Plan Note (Signed)
OFFICE VISIT  [name] [name], [name] [name] DOB:  1940/08/22                                        Jul 06, 2010 CHART #:  [id number]  The patient returns today.  Her blood pressure is 154/81, pulse 88, respirations 20 and sats were 98%.  We stopped her oxygen.  We will have that picked up.  Her incisions are well-healed.  Her chest x-ray showed further improvement.  We planned just a decrease in her effusion.  We planned to see her back again in 2 months with a chest x-ray and then put her on a regular followup.  Ines Bloomer, M.D. Electronically Signed  DPB/MEDQ  D:  07/06/2010  T:  07/07/2010  Job:  [id number]  cc:   Lebron Conners. Darovsky, M.D.

## 2010-09-07 ENCOUNTER — Other Ambulatory Visit: Payer: Self-pay | Admitting: Thoracic Surgery

## 2010-09-07 ENCOUNTER — Ambulatory Visit
Admission: RE | Admit: 2010-09-07 | Discharge: 2010-09-07 | Disposition: A | Discharge status: Home / Self Care | Payer: Medicare Other | Source: Ambulatory Visit | Attending: Thoracic Surgery | Admitting: Thoracic Surgery

## 2010-09-07 ENCOUNTER — Ambulatory Visit (INDEPENDENT_AMBULATORY_CARE_PROVIDER_SITE_OTHER): Payer: Medicare Other | Admitting: Thoracic Surgery

## 2010-09-07 DIAGNOSIS — C3430 Malignant neoplasm of lower lobe, unspecified bronchus or lung: Secondary | ICD-10-CM

## 2010-09-07 DIAGNOSIS — C3490 Malignant neoplasm of unspecified part of unspecified bronchus or lung: Secondary | ICD-10-CM

## 2010-09-07 NOTE — Letter (Signed)
September 07, 2010  Boris Judie Petit. Darovsky, MD [address] [place], [place]  [address].  Re:  [name] [name], [name] [name]              DOB:  [birth year]  The patient comes in today and her chest x-ray shows resolution of pleural effusion, just postoperative changes.  She is doing well overall and is back to full activities.  Apparently she will be seen in a couple of weeks and then hopefully will get a followup CT scan in 2-3 months. I will release her back CU, but we happy to see her at anytime.  Her blood pressure is 140/82, pulse 84, respirations 18, and sats were 98% on room air.  Ines Bloomer, M.D. Electronically Signed  DPB/MEDQ  D:  09/07/2010  T:  09/07/2010  Job:  [id number]

## 2010-12-29 ENCOUNTER — Other Ambulatory Visit (HOSPITAL_COMMUNITY): Payer: Self-pay | Admitting: Internal Medicine

## 2010-12-29 DIAGNOSIS — C259 Malignant neoplasm of pancreas, unspecified: Secondary | ICD-10-CM

## 2011-02-01 ENCOUNTER — Encounter (HOSPITAL_COMMUNITY): Payer: Self-pay

## 2011-02-01 ENCOUNTER — Encounter (HOSPITAL_COMMUNITY)
Admission: RE | Admit: 2011-02-01 | Discharge: 2011-02-01 | Disposition: A | Discharge status: Home / Self Care | Payer: Medicare Other | Source: Ambulatory Visit | Attending: Internal Medicine | Admitting: Internal Medicine

## 2011-02-01 DIAGNOSIS — C259 Malignant neoplasm of pancreas, unspecified: Secondary | ICD-10-CM | POA: Insufficient documentation

## 2011-02-01 DIAGNOSIS — J984 Other disorders of lung: Secondary | ICD-10-CM | POA: Insufficient documentation

## 2011-02-01 DIAGNOSIS — J9819 Other pulmonary collapse: Secondary | ICD-10-CM | POA: Insufficient documentation

## 2011-02-01 LAB — GLUCOSE, CAPILLARY: Glucose-Capillary: 111 mg/dL — ABNORMAL HIGH (ref 70–99)

## 2011-02-01 MED ORDER — FLUDEOXYGLUCOSE F - 18 (FDG) INJECTION
18.0000 | Freq: Once | INTRAVENOUS | Status: AC | PRN
  Administered 2011-02-01: 18 via INTRAVENOUS

## 2011-02-25 ENCOUNTER — Other Ambulatory Visit (HOSPITAL_COMMUNITY): Payer: Self-pay | Admitting: Internal Medicine

## 2011-02-25 DIAGNOSIS — C259 Malignant neoplasm of pancreas, unspecified: Secondary | ICD-10-CM

## 2011-02-25 DIAGNOSIS — C787 Secondary malignant neoplasm of liver and intrahepatic bile duct: Secondary | ICD-10-CM

## 2011-02-25 DIAGNOSIS — C3490 Malignant neoplasm of unspecified part of unspecified bronchus or lung: Secondary | ICD-10-CM

## 2011-03-01 ENCOUNTER — Ambulatory Visit
Admission: RE | Admit: 2011-03-01 | Discharge: 2011-03-01 | Disposition: A | Discharge status: Home / Self Care | Payer: Medicare Other | Source: Ambulatory Visit | Attending: Internal Medicine | Admitting: Internal Medicine

## 2011-03-01 DIAGNOSIS — C259 Malignant neoplasm of pancreas, unspecified: Secondary | ICD-10-CM

## 2011-03-01 DIAGNOSIS — C3490 Malignant neoplasm of unspecified part of unspecified bronchus or lung: Secondary | ICD-10-CM

## 2011-03-01 HISTORY — DX: Malignant neoplasm of cervix uteri, unspecified: C53.9

## 2011-03-01 HISTORY — DX: Essential (primary) hypertension: I10

## 2011-03-01 HISTORY — DX: Pure hypercholesterolemia, unspecified: E78.00

## 2011-03-03 ENCOUNTER — Other Ambulatory Visit (HOSPITAL_COMMUNITY): Payer: Self-pay | Admitting: Interventional Radiology

## 2011-03-03 DIAGNOSIS — K769 Liver disease, unspecified: Secondary | ICD-10-CM

## 2011-03-04 ENCOUNTER — Other Ambulatory Visit: Payer: Self-pay | Admitting: Radiology

## 2011-03-04 ENCOUNTER — Encounter (HOSPITAL_COMMUNITY): Payer: Self-pay

## 2011-03-08 ENCOUNTER — Ambulatory Visit (HOSPITAL_COMMUNITY)
Admission: RE | Admit: 2011-03-08 | Discharge: 2011-03-08 | Disposition: A | Discharge status: Home / Self Care | Payer: Medicare Other | Source: Ambulatory Visit | Attending: Interventional Radiology | Admitting: Interventional Radiology

## 2011-03-08 ENCOUNTER — Encounter (HOSPITAL_COMMUNITY): Payer: Self-pay

## 2011-03-08 DIAGNOSIS — I10 Essential (primary) hypertension: Secondary | ICD-10-CM | POA: Insufficient documentation

## 2011-03-08 DIAGNOSIS — Z7982 Long term (current) use of aspirin: Secondary | ICD-10-CM | POA: Insufficient documentation

## 2011-03-08 DIAGNOSIS — Z79899 Other long term (current) drug therapy: Secondary | ICD-10-CM | POA: Insufficient documentation

## 2011-03-08 DIAGNOSIS — Z90710 Acquired absence of both cervix and uterus: Secondary | ICD-10-CM | POA: Insufficient documentation

## 2011-03-08 DIAGNOSIS — Z8541 Personal history of malignant neoplasm of cervix uteri: Secondary | ICD-10-CM | POA: Insufficient documentation

## 2011-03-08 DIAGNOSIS — K7689 Other specified diseases of liver: Secondary | ICD-10-CM | POA: Insufficient documentation

## 2011-03-08 DIAGNOSIS — E7800 Pure hypercholesterolemia, unspecified: Secondary | ICD-10-CM | POA: Insufficient documentation

## 2011-03-08 DIAGNOSIS — Z8509 Personal history of malignant neoplasm of other digestive organs: Secondary | ICD-10-CM | POA: Insufficient documentation

## 2011-03-08 DIAGNOSIS — Z9089 Acquired absence of other organs: Secondary | ICD-10-CM | POA: Insufficient documentation

## 2011-03-08 DIAGNOSIS — K769 Liver disease, unspecified: Secondary | ICD-10-CM

## 2011-03-08 LAB — CBC
MCH: 30.2 pg (ref 26.0–34.0)
MCHC: 33.4 g/dL (ref 30.0–36.0)
MCV: 90.5 fL (ref 78.0–100.0)
Platelets: 106 10*3/uL — ABNORMAL LOW (ref 150–400)
RBC: 3.77 MIL/uL — ABNORMAL LOW (ref 3.87–5.11)
RDW: 13.7 % (ref 11.5–15.5)

## 2011-03-08 LAB — PROTIME-INR: Prothrombin Time: 14.5 seconds (ref 11.6–15.2)

## 2011-03-08 MED ORDER — FENTANYL CITRATE 0.05 MG/ML IJ SOLN
INTRAMUSCULAR | Status: AC
  Filled 2011-03-08: qty 6

## 2011-03-08 MED ORDER — MIDAZOLAM HCL 2 MG/2ML IJ SOLN
INTRAMUSCULAR | Status: AC
  Filled 2011-03-08: qty 6

## 2011-03-08 MED ORDER — FENTANYL CITRATE 0.05 MG/ML IJ SOLN
INTRAMUSCULAR | Status: AC | PRN
  Administered 2011-03-08: 100 ug via INTRAVENOUS
  Administered 2011-03-08: 50 ug via INTRAVENOUS

## 2011-03-08 MED ORDER — HEPARIN SOD (PORK) LOCK FLUSH 100 UNIT/ML IV SOLN
INTRAVENOUS | Status: AC
  Administered 2011-03-08: 500 [IU]
  Filled 2011-03-08: qty 5

## 2011-03-08 MED ORDER — HEPARIN SOD (PORK) LOCK FLUSH 100 UNIT/ML IV SOLN
500.0000 [IU] | INTRAVENOUS | Status: AC | PRN
  Administered 2011-03-08: 500 [IU]

## 2011-03-08 MED ORDER — SODIUM CHLORIDE 0.9 % IV SOLN
INTRAVENOUS | Status: DC
  Administered 2011-03-08: 500 mL via INTRAVENOUS

## 2011-03-08 MED ORDER — MIDAZOLAM HCL 5 MG/5ML IJ SOLN
INTRAMUSCULAR | Status: AC | PRN
  Administered 2011-03-08: 2 mg via INTRAVENOUS
  Administered 2011-03-08: 1 mg via INTRAVENOUS

## 2011-03-08 NOTE — Procedures (Signed)
Procedure:  CT guided liver biopsy Findings:  Inferior right lobe liver lesion core bx.  18G cores x 3 via 17G needle. Plan:  3 hr recovery

## 2011-03-08 NOTE — H&P (Signed)
[name] [name] is an 71 y.o. female.   Chief Complaint: Liver lesion HPI: Hx of Pancreatic ca; lung ca; cervical ca Scheduled for liver lesion biopsy today  Past Medical History  Diagnosis Date  . Cancer July 2010    Pancreatic adenocarcinoma  . Cervical cancer 1963  . Emphysema   . Hypertension   . Hypercholesterolemia     Past Surgical History  Procedure Date  . Vaginal hysterectomy 1963    Cervical Cancer  . Tonsillectomy 1943  . Whipple procedure July 2010    pancreatic adenocarcinoma  . Cholecystectomy July 2010    done w/ Whipple procedure  . Breast surgery 1988    Right breast abcess  . Bunionectomy 2007  . Eye surgery 2009    Bilateral cataract    History reviewed. No pertinent family history. Social History:  reports that she quit smoking about 15 years ago. Her smoking use included Cigarettes. She has a 30 pack-year smoking history. She has never used smokeless tobacco. She reports that she does not drink alcohol or use illicit drugs.  Allergies: No Known Allergies  Medications Prior to Admission  Medication Sig Dispense Refill  . aspirin 81 MG tablet Take 81 mg by mouth daily before breakfast.       . losartan (COZAAR) 100 MG tablet Take 100 mg by mouth daily before breakfast.       . metoprolol succinate (TOPROL-XL) 50 MG 24 hr tablet Take 25 mg by mouth daily before breakfast. Take with or immediately following a meal.      . naproxen sodium (ANAPROX) 220 MG tablet Take 220 mg by mouth 2 (two) times daily as needed. For pain.      Marland Kitchen omeprazole (PRILOSEC) 20 MG capsule Take 20 mg by mouth 2 (two) times daily.       . simvastatin (ZOCOR) 20 MG tablet Take 20 mg by mouth every evening.       Medications Prior to Admission  Medication Dose Route Frequency Provider Last Rate Last Dose  . 0.9 %  sodium chloride infusion   Intravenous Continuous Robet Leu, PA        No results found for this or any previous visit (from the past 48 hour(s)). No results  found.  Review of Systems  Constitutional: Negative for fever and chills.  Respiratory: Negative for cough.   Cardiovascular: Negative for chest pain.  Gastrointestinal: Negative for nausea, vomiting and abdominal pain.  Neurological: Negative for headaches.    There were no vitals taken for this visit. Physical Exam  Constitutional: She is oriented to person, place, and time. She appears well-developed and well-nourished.  HENT:  Head: Normocephalic and atraumatic.  Eyes: EOM are normal.  Neck: Normal range of motion. Neck supple.  Cardiovascular: Normal rate, regular rhythm and normal heart sounds.   No murmur heard. Respiratory: Effort normal and breath sounds normal. She has no wheezes.  GI: Soft. Bowel sounds are normal.  Musculoskeletal: Normal range of motion.  Neurological: She is alert and oriented to person, place, and time.  Skin: Skin is warm and dry.     Assessment/Plan Pt with hx of pancreatic ca; lung ca; cervical ca; now with liver lesions. Scheduled for liver lesion biopsy Aware of procedure benefits and risks and agreeable to proceed. Consent signed.  Seabron Iannello A 03/08/2011, 8:33 AM

## 2011-03-08 NOTE — ED Notes (Signed)
Samples being collected

## 2011-03-08 NOTE — Progress Notes (Signed)
Ambulated to BR with assist and voided without problem

## 2011-03-08 NOTE — H&P (Signed)
Agree 

## 2011-03-11 ENCOUNTER — Ambulatory Visit (HOSPITAL_COMMUNITY): Payer: Medicare Other

## 2011-03-11 ENCOUNTER — Other Ambulatory Visit (HOSPITAL_COMMUNITY): Payer: Medicare Other

## 2011-03-15 ENCOUNTER — Other Ambulatory Visit (HOSPITAL_COMMUNITY): Payer: Medicare Other

## 2011-03-17 ENCOUNTER — Encounter: Payer: Self-pay | Admitting: *Deleted

## 2011-03-17 DIAGNOSIS — I10 Essential (primary) hypertension: Secondary | ICD-10-CM | POA: Insufficient documentation

## 2011-03-17 DIAGNOSIS — J439 Emphysema, unspecified: Secondary | ICD-10-CM | POA: Insufficient documentation

## 2011-03-17 DIAGNOSIS — C539 Malignant neoplasm of cervix uteri, unspecified: Secondary | ICD-10-CM | POA: Insufficient documentation

## 2011-03-17 NOTE — Progress Notes (Signed)
New consult  Lung  Cancer for possible stereotatic radiotherapy  Hx Metastatic  Pancreatic cancer,Cervical Cancer age 71,   Chemotherapy regimen,Gemzar 08/2008-06/2009 Folfax  For 5 cycles begun 11/2009 Recently  Widowed, brother deceased pancreatic cancer,no children   Allergies: NKDA

## 2011-03-18 ENCOUNTER — Ambulatory Visit
Admission: RE | Admit: 2011-03-18 | Discharge: 2011-03-18 | Disposition: A | Discharge status: Home / Self Care | Payer: Medicare Other | Source: Ambulatory Visit | Attending: Radiation Oncology | Admitting: Radiation Oncology

## 2011-03-18 ENCOUNTER — Encounter: Payer: Self-pay | Admitting: Radiation Oncology

## 2011-03-18 VITALS — BP 188/78 | HR 60 | Temp 98.1°F | Resp 20 | Ht 65.0 in | Wt 158.1 lb

## 2011-03-18 DIAGNOSIS — C3490 Malignant neoplasm of unspecified part of unspecified bronchus or lung: Secondary | ICD-10-CM

## 2011-03-18 DIAGNOSIS — Z510 Encounter for antineoplastic radiation therapy: Secondary | ICD-10-CM | POA: Insufficient documentation

## 2011-03-18 DIAGNOSIS — Z79899 Other long term (current) drug therapy: Secondary | ICD-10-CM | POA: Insufficient documentation

## 2011-03-18 DIAGNOSIS — C3410 Malignant neoplasm of upper lobe, unspecified bronchus or lung: Secondary | ICD-10-CM | POA: Insufficient documentation

## 2011-03-18 DIAGNOSIS — Z7982 Long term (current) use of aspirin: Secondary | ICD-10-CM | POA: Insufficient documentation

## 2011-03-18 NOTE — Progress Notes (Signed)
Please see the Nurse Progress Note in the MD Initial Consult Encounter for this patient. 

## 2011-03-18 NOTE — Progress Notes (Signed)
Met with patient to discuss RO billing.  Rad Tx: [id number] SRS Robot  Attending Rad: Dr. Kathrynn Running  Dx: 162.9 Lung, NOs

## 2011-03-20 ENCOUNTER — Encounter: Payer: Self-pay | Admitting: Radiation Oncology

## 2011-03-20 NOTE — Progress Notes (Signed)
Radiation Oncology         [phone] ________________________________  Name: [name] [name] MRN: [id number]  Date: 03/20/2011  DOB: [birth year]       Follow-Up Visit Note  CC: [name] Peri, MD, MD  [name] Reading, MD  Diagnosis:   71 year old woman with a clinical stage IA non-small cell carcinoma left upper lobe lung-stage I  Narrative . . . . . . . . The patient returns today to coordinate further therapy. I met with her in consultation and the [facility] cancer [facility] he needn't a few weeks ago. At that time, we talked to her about the treatment of the PET positive left upper lung nodule suspected to represent a new early stage primary lung cancer. The patient also had a background history of pancreatic cancer and cervical cancer. She's also had a early stage lung cancer. At that time the dose speaking with her, she had a larger liver lesion which was of concern for potential malignancy. She underwent CT-guided biopsy of the liver which revealed benign tissue suggestive of a liver abscess. Accordingly, this did not require any further intervention at that time. She has kindly been referred back for treatment of the left upper lung nodule with stereotactic body radiotherapy.                         ALLERGIES:   has no known allergies.  Meds. . . . . . . . . . . . Current Outpatient Prescriptions  Medication Sig Dispense Refill  . hydrochlorothiazide (HYDRODIURIL) 25 MG tablet Take 25 mg by mouth daily.      Marland Kitchen losartan (COZAAR) 100 MG tablet Take 100 mg by mouth daily before breakfast.       . metoprolol succinate (TOPROL-XL) 50 MG 24 hr tablet Take 25 mg by mouth daily before breakfast. Take with or immediately following a meal.      . naproxen sodium (ANAPROX) 220 MG tablet Take 220 mg by mouth 2 (two) times daily as needed. For pain.      Marland Kitchen omeprazole (PRILOSEC) 20 MG capsule Take 20 mg by mouth 2 (two) times daily.       . simvastatin (ZOCOR) 20 MG tablet Take 20 mg by mouth  every evening.      Marland Kitchen aspirin 81 MG tablet Take 81 mg by mouth daily before breakfast.         Physical Findings. . .  height is 5\' 5"  (1.651 m) and weight is 158 lb 1.6 oz (71.714 kg). Her oral temperature is 98.1 F (36.7 C). Her blood pressure is 188/78 and her pulse is 60. Her respiration is 20 and oxygen saturation is 98%. .  The patient is in no acute distress today. She is alert and oriented. Head is normocephalic and atraumatic. Extraocular muscles are intact. Motor strength is 5/5 nipple discharge. Respiratory effort is unremarkable. Speech is fluent articulate. Gait is unremarkable. Mood is appropriate.  Lab Findings . . . . .  Lab Results  Component Value Date   WBC 6.7 03/08/2011   HGB 11.4* 03/08/2011   HCT 34.1* 03/08/2011   MCV 90.5 03/08/2011   PLT 106* 03/08/2011    X-Ray Findings. . . . Ct Biopsy  03/08/2011  *RADIOLOGY REPORT*  Clinical Data: History of pancreatic carcinoma.  New hypermetabolic lesion in the inferior right lobe of the liver.  The patient presents for biopsy for tissue confirmation of malignancy prior to potential percutaneous ablation  of the liver.  CT GUIDED CORE BIOPSY OF LIVER  Sedation:   3.0 mg IV Versed;  150 mcg IV Fentanyl  Total Moderate Sedation Time: 20 minutes.  Procedure:  The procedure risks, benefits, and alternatives were explained to the patient.  Questions regarding the procedure were encouraged and answered.  The patient understands and consents to the procedure.  The right abdominal wall was prepped with betadine in a sterile fashion, and a sterile drape was applied covering the operative field.  A sterile gown and sterile gloves were used for the procedure.  Local anesthesia was provided with 1% Lidocaine.  CT was performed in a supine position.  Under CT guidance, a 17 gauge needle was advanced to the level of a right hepatic lesion. After confirming needle position, coaxial core biopsy samples were obtained with an 18 gauge device.  Three  separate core samples were submitted in formalin.  Complications: None  Findings: Relatively low attenuation lesion corresponding to the increased metabolic activity by PET scan was localized in the inferior right lobe.  Solid core tissue samples were obtained.  IMPRESSION: CT guided  core biopsy performed of a lesion in the right lobe of the liver.  Original Report Authenticated By: Reola Calkins, M.D.   [facility]  03/07/2011  *RADIOLOGY REPORT*  NEW PATIENT OFFICE VISIT - LEVEL III [phone]):  03/01/2011  [name] [name], M.D. [facility] [address] [place], [place]  [address]  RE:  [name] [name] (DOB: [birth year])  Dear Jeb Levering:  Thank you for sending Mrs. [name] [name] for consultation regarding possible use of percutaneous ablation to treat a liver lesion.  The patient is a 71 year old female with a history of pancreatic adenocarcinoma diagnosed in July of 2010.  This was initially treated with a Whipple procedure.  The patient received Gemcitabine through May of 2011.  FOLFOX chemotherapy was begun October of 2011.  Metastatic pulmonary nodules of the right upper lobe and right lower lobe were resected by Dr. Dewayne Shorter on 12/24/09 with pathology demonstrating adenocarcinoma consistent with metastatic pancreatic adenocarcinoma.  She had additional resection on 06/03/10 of a left lower lobe nodule by Dr. Edwyna Shell which was thought to represent a primary lung adenocarcinoma.  PET scan on 02/01/11  demonstrated increased metabolic activity in a left upper lobe pulmonary nodule and in the right lobe of the liver.  The patient denies any current abdominal complaints.  Past Medical History:     1.    Cervical carcinoma at the age of 48.  Status post       hysterectomy. 2.    History of hypertension.  The patient is followed by Dr. Sherryll Burger. 3.    History of elevated cholesterol. 4.    History of gastroesophageal reflux. 5.    History of emphysema. 6.    Prior right breast abscess.   Medications:  Omeprazole 20 mg twice daily, Toprol 25 mg half pill daily, Zocor 20 mg daily, Losartan 100 mg daily, aspirin 81 mg daily.  Allergies:  No known drug allergies.  Social History:  The patient is widowed.  Her husband recently died on 02/03/01 after complications related to redo CABG.  She lives in [place] and has no children.  She is retired and previously worked at [employer].  She denies alcohol use.  She stopped smoking in 1998.  Family History:  Hypertension, diabetes and lung disease in multiple family members.  Review of Systems:  General:  5'5" in height.  160 pounds in weight.  Vision:  Prior bilateral cataract removal. Gastrointestinal:  No nausea, vomiting or diarrhea.  Genitourinary: No urinary symptoms.  Cardiovascular:  No chest pain or palpitations.  Musculoskeletal:  No muscle or joint pain. Neurologic:  No neurologic symptoms.  Respiratory:  No cough or shortness of breath.  Exam:  Vital signs:  Blood pressure 191/96, pulse 63, respirations 14, temperature 98.2, oxygen saturation 99 percent on room air. Chest:  Clear to auscultation bilaterally.  Heart:  Regular rate and rhythm.  Abdomen:  Soft and nontender.  No distention. Extremities:  No edema.  Labs:  BUN 9, creatinine 0.97, total bilirubin 0.5, alkaline phosphatase 85, AST 24, ALT 14, total protein 5.4, albumin 2.9, WBC 8.4, hemoglobin 11.3, hematocrit 33.8, platelet count 121.  Imaging:  Prior imaging was independently reviewed.  Assessment:  I met with Mrs. Vosler and her sister.  We reviewed imaging findings.  The recent PET scan demonstrates a focal area of hypermetabolic activity in the inferior right lobe of the liver. The corresponding unenhanced CT images demonstrate a fairly geographic area of low attenuation in this region.  Although this is concerning for metastatic disease given PET activity, this is an unusual appearing lesion and I have recommended percutaneous biopsy of the lesion prior to determining whether ablation  would be appropriate.  It does not appear that the patient has any other metastatic lesions in the liver.  Details of percutaneous thermal ablation were discussed with the patient including risks.  She was given additional literature to read.  If this is an isolated metastatic lesion in the liver, it would be amenable to percutaneous microwave ablation.  This would involve general anesthesia and overnight observation at [facility].  After discussion, the patient would like to proceed with biopsy of the lesion.  Given that it is very visible by unenhanced CT, I have recommended CT guidance.  This will be scheduled soon as an outpatient.  I will forward biopsy results to you.  If positive for metastatic malignancy, we will begin the scheduling process for percutaneous ablation of the liver.  Thank you again for sending Mrs. Ditter for consultation and allowing me to participate in her care.  Sincerely,  Irish Lack, M.D. Lavone NianKirstie [name], M.D.      Dewayne Shorter, M.D.  Original Report Authenticated By: Reola Calkins, M.D.    Impression . . . . . . . The patient has a solitary nodule of left upper lung which is positive on PET CT. She has a history of early stage lung cancer. Her case was reviewed with thoracic surgery. She is not felt to be an ideal candidate for surgical intervention at this time given previous lung surgeries. She also not felt to be a candidate for CT-guided biopsy given a potential risk for lung injury including pneumothorax. In this setting, she is a eligible for stereotactic body radiotherapy with curative intent.  Plan . . . . . . . . . . . . today, I spent time with the patient reviewing the findings and workup thus far. We also discussed the role of radiation therapy management of early stage non-small cell lung cancer. We discussed the details related to stereotactic body radiotherapy including the body positioning, the percentage and radiation delivery, and followup  care. The patient would like to proceed with stereotactic body radiotherapy today and provided informed written consent. We'll move towards the CT-based treatment planning today and proceed with radiation treatment during  the next week or so.  _____________________________________  Artist Pais. Kathrynn Running, M.D.

## 2011-03-20 NOTE — Progress Notes (Signed)
  Radiation Oncology         ([phone]) [phone] ________________________________  Name: RACHAL DVORSKY MRN: [id number]  Date: 03/20/2011  DOB: 04-26-1940  Stereotactic body radiotherapy SIMULATION AND TREATMENT PLANNING NOTE  DIAGNOSIS:  71 year old woman with clinical stage IA non-small cell carcinoma left upper lobe lung  NARRATIVE:  The patient was brought to the CT Simulation planning suite.  Identity was confirmed.  All relevant records and images related to the planned course of therapy were reviewed.  The patient freely provided informed written consent to proceed with treatment after reviewing the details related to the planned course of therapy. The consent form was witnessed and verified by the simulation staff.  Then, the patient was set-up in a stable reproducible  supine position for radiation therapy.  CT images were obtained.  Surface markings were placed.  The CT images were loaded into the planning software.  A 4-dimensional CT study set was obtained to account for respiratory excursion. Using cine motion of the CT study set as well as a maximum intensity projection (MIP) study set, the target and avoidance structures were contoured.  Treatment planning then occurred.  The radiation prescription was entered and confirmed.  A total of 1 complex treatment device was fabricated. This was a body fix immobilization below with a compression panel on the upper abdomen to help provide some stabilization of respiratory excursion. I have requested : 3D Simulation  I have requested a DVH of the following structures: Heart, lungs, chest wall, great vessels, trachea, esophagus, and target.   PLAN:  The patient will receive 54 Gy in 3 fractions.  ________________________________  Artist Pais Kathrynn Running, M.D.

## 2011-03-31 ENCOUNTER — Ambulatory Visit
Admission: RE | Admit: 2011-03-31 | Discharge: 2011-03-31 | Disposition: A | Discharge status: Home / Self Care | Payer: Medicare Other | Source: Ambulatory Visit | Attending: Radiation Oncology | Admitting: Radiation Oncology

## 2011-03-31 DIAGNOSIS — C3490 Malignant neoplasm of unspecified part of unspecified bronchus or lung: Secondary | ICD-10-CM

## 2011-04-04 ENCOUNTER — Ambulatory Visit
Admission: RE | Admit: 2011-04-04 | Discharge: 2011-04-04 | Disposition: A | Discharge status: Home / Self Care | Payer: Medicare Other | Source: Ambulatory Visit | Attending: Radiation Oncology | Admitting: Radiation Oncology

## 2011-04-04 DIAGNOSIS — C3490 Malignant neoplasm of unspecified part of unspecified bronchus or lung: Secondary | ICD-10-CM

## 2011-04-07 ENCOUNTER — Ambulatory Visit
Admission: RE | Admit: 2011-04-07 | Discharge: 2011-04-07 | Disposition: A | Discharge status: Home / Self Care | Payer: Medicare Other | Source: Ambulatory Visit | Attending: Radiation Oncology | Admitting: Radiation Oncology

## 2011-04-07 ENCOUNTER — Encounter: Payer: Self-pay | Admitting: Radiation Oncology

## 2011-04-07 ENCOUNTER — Ambulatory Visit: Payer: Medicare Other

## 2011-04-07 VITALS — BP 133/77 | HR 64 | Resp 18 | Wt 160.9 lb

## 2011-04-07 DIAGNOSIS — C3490 Malignant neoplasm of unspecified part of unspecified bronchus or lung: Secondary | ICD-10-CM

## 2011-04-07 NOTE — Progress Notes (Signed)
  Radiation Oncology         [phone] ________________________________  Name: [name] [name] MRN: [id number]  Date: 04/07/2011  DOB: [birth year]  Weekly Radiation Therapy Management  Current Dose: 54 Gy     Planned Dose:  54 Gy  Narrative . . . . . . . . The patient presents for routine under treatment assessment.                                                      The patient is without complaint.                                 Set-up films were reviewed.                                 The chart was checked. Physical Findings. . . Weight essentially stable.  No significant changes. Impression . . . . . . . The patient is  tolerating radiation. Plan . . . . . . . . . . . Marland Kitchen complete treatment as planned.  ________________________________  Artist Pais. Kathrynn Running, M.D.

## 2011-04-07 NOTE — Progress Notes (Signed)
Patient presents to the clinic today accompanied by a family member for an under treat visit with Dr. Kathrynn Running. Today is this patient's final treatment. Provided patient with an appointment card to return in one month for follow up. Encouraged patient to contact our staff with needs. Patient is alert and oriented to person, place, and time. No distress noted. Steady gait noted. Pleasant affect noted. Patient denies pain at this time. Patient denies nausea, vomiting, headache, or diarrhea. Reported all findings to Dr. Kathrynn Running.

## 2011-04-08 ENCOUNTER — Ambulatory Visit: Payer: Medicare Other

## 2011-04-12 ENCOUNTER — Encounter: Payer: Self-pay | Admitting: Radiation Oncology

## 2011-04-12 NOTE — Progress Notes (Signed)
   Department of Radiation Oncology  Phone:  [phone] Fax:        [phone]   ________________________________  Stereotactic Body Radiotherapy Treatment Procedure Note   Name: [name] [name] MRN: [id number]  Date: 04/12/2011 DOB: March 31, 1940   SPECIAL TREATMENT PROCEDURE   NARRATIVE: [name] [name] was brought to the TrueBeam stereotactic radiation treatment machine and placed supine on the CT couch. The patient was set up for stereotactic body radiotherapy on the body fix pillow.   SIMULATION VERIFICATION: The patient underwent cone beam CT imaging. These were carefully aligned and repeated to confirm treatment position.   SPECIAL TREATMENT PROCEDURE: [name] [name] received stereotactic body radiotherapy Left upper lung target using 2 Rapid Arc VMAT Beams to a prescription dose of 18 Gy for her 2nd fraction.   STEREOTACTIC TREATMENT MANAGEMENT: Following delivery, the patient was evaluated clinically. The patient tolerated  treatment without significant acute effects, and was discharged to home in stable condition.   PLAN: Continue treatment as planned.  ________________________________  Artist Pais. Kathrynn Running, M.D.

## 2011-04-12 NOTE — Progress Notes (Signed)
   Department of Radiation Oncology  Phone:  [phone] Fax:        [phone]  Stereotactic Body Radiotherapy Treatment Procedure Note   Name: [name] [name]       MRN: [id number]  Date: 04/07/2011                  DOB: 04-28-1940   SPECIAL TREATMENT PROCEDURE   NARRATIVE: [name] [name] was brought to the TrueBeam stereotactic radiation treatment machine and placed supine on the CT couch. The patient was set up for stereotactic body radiotherapy on the body fix pillow.   SIMULATION VERIFICATION: The patient underwent cone beam CT imaging. These were carefully aligned and repeated to confirm treatment position.   SPECIAL TREATMENT PROCEDURE: [name] [name] received stereotactic body radiotherapy Left upper lung target using 2 Rapid Arc VMAT Beams to a prescription dose of 18 Gy.   STEREOTACTIC TREATMENT MANAGEMENT: Following delivery, the patient was evaluated clinically. The patient tolerated treatment without significant acute effects, and was discharged to home in stable condition.   PLAN: Complete treatment as planned.  ________________________________  Artist Pais. Kathrynn Running, M.D.

## 2011-04-12 NOTE — Progress Notes (Signed)
  Radiation Oncology         [phone] ________________________________  Name: DEEANNE [name] MRN: [id number]  Date: 04/12/2011  DOB: Jul 22, 1940  End of Treatment Note  Diagnosis:   71 year old woman with clinical stage IA non-small cell carcinoma left upper lobe lung  Indication for treatment:  Curative       Radiation treatment dates:   2/7, 11, and 14/2013  Site/dose:   54 gray in 3 fractions of 18 gray   Beams/energy:    stereotactic body radiotherapy was delivered using rapid ARC with 6 megavolt photons delivered in the flattening filter free beam mode. The 80% isodose line was prescribed the patient was set up daily with cone beam CT imaging.  Narrative: The patient tolerated radiation treatment relatively well.   She experienced no acute toxicities during radiation.  Plan: The patient has completed radiation treatment. The patient will return to radiation oncology clinic for routine followup in one month. I advised them to call or return sooner if they have any questions or concerns related to their recovery or treatment. ________________________________  Artist Pais. Kathrynn Running, M.D.

## 2011-04-12 NOTE — Progress Notes (Signed)
  Radiation Oncology         ([phone]) [phone] ________________________________  Stereotactic Body Radiotherapy Treatment Procedure Note  Name: [name] [name] MRN: [id number]  Date: 04/12/2011  DOB: November 12, 1940  SPECIAL TREATMENT PROCEDURE  NARRATIVE:  [name] [name] was brought to the TrueBeam stereotactic radiation treatment machine and placed supine on the CT couch. The patient was set up for stereotactic body radiotherapy on the body fix pillow.  SIMULATION VERIFICATION:  The patient underwent cone beam CT imaging.  These were carefully aligned and repeated to confirm treatment position.  SPECIAL TREATMENT PROCEDURE: [name] [name] received stereotactic body radiotherapy Left upper lung target using 2 Rapid Arc VMAT Beams to a prescription dose of 18 Gy.    STEREOTACTIC TREATMENT MANAGEMENT:  Following delivery, the patient was evaluated clinically. The patient tolerated treatment without significant acute effects, and was discharged to home in stable condition.    PLAN: Continue treatment as planned.  ________________________________  Artist Pais. Kathrynn Running, M.D.

## 2011-05-05 ENCOUNTER — Ambulatory Visit: Payer: Medicare Other | Admitting: Radiation Oncology

## 2011-07-06 ENCOUNTER — Encounter: Payer: Medicare Other | Admitting: Internal Medicine

## 2011-07-12 NOTE — Progress Notes (Signed)
Encounter addended by: Wasif Simonich Bruner Starlee Corralejo, RN on: 07/12/2011 11:08 AM<BR>     Documentation filed: Charges VN

## 2011-07-19 ENCOUNTER — Encounter: Payer: Medicare Other | Admitting: Internal Medicine

## 2011-07-19 DIAGNOSIS — C3490 Malignant neoplasm of unspecified part of unspecified bronchus or lung: Secondary | ICD-10-CM

## 2011-07-19 DIAGNOSIS — C259 Malignant neoplasm of pancreas, unspecified: Secondary | ICD-10-CM

## 2011-08-24 DIAGNOSIS — Z452 Encounter for adjustment and management of vascular access device: Secondary | ICD-10-CM

## 2011-08-24 DIAGNOSIS — C259 Malignant neoplasm of pancreas, unspecified: Secondary | ICD-10-CM

## 2011-09-08 ENCOUNTER — Encounter: Payer: Medicare Other | Admitting: Internal Medicine

## 2011-09-08 DIAGNOSIS — G893 Neoplasm related pain (acute) (chronic): Secondary | ICD-10-CM

## 2011-09-08 DIAGNOSIS — C259 Malignant neoplasm of pancreas, unspecified: Secondary | ICD-10-CM

## 2011-09-08 DIAGNOSIS — C3490 Malignant neoplasm of unspecified part of unspecified bronchus or lung: Secondary | ICD-10-CM

## 2011-09-13 ENCOUNTER — Encounter: Payer: Medicare Other | Admitting: Internal Medicine

## 2011-09-13 DIAGNOSIS — R079 Chest pain, unspecified: Secondary | ICD-10-CM

## 2011-09-13 DIAGNOSIS — C259 Malignant neoplasm of pancreas, unspecified: Secondary | ICD-10-CM

## 2011-09-13 DIAGNOSIS — C3490 Malignant neoplasm of unspecified part of unspecified bronchus or lung: Secondary | ICD-10-CM

## 2011-10-05 ENCOUNTER — Encounter: Payer: Medicare Other | Admitting: Internal Medicine

## 2011-10-05 ENCOUNTER — Other Ambulatory Visit (HOSPITAL_COMMUNITY): Payer: Self-pay | Admitting: Internal Medicine

## 2011-10-05 DIAGNOSIS — C3490 Malignant neoplasm of unspecified part of unspecified bronchus or lung: Secondary | ICD-10-CM

## 2011-10-05 DIAGNOSIS — C259 Malignant neoplasm of pancreas, unspecified: Secondary | ICD-10-CM

## 2011-11-16 DIAGNOSIS — C259 Malignant neoplasm of pancreas, unspecified: Secondary | ICD-10-CM

## 2011-11-16 DIAGNOSIS — Z452 Encounter for adjustment and management of vascular access device: Secondary | ICD-10-CM

## 2011-12-28 DIAGNOSIS — C259 Malignant neoplasm of pancreas, unspecified: Secondary | ICD-10-CM

## 2011-12-28 DIAGNOSIS — C3430 Malignant neoplasm of lower lobe, unspecified bronchus or lung: Secondary | ICD-10-CM

## 2011-12-28 DIAGNOSIS — Z452 Encounter for adjustment and management of vascular access device: Secondary | ICD-10-CM

## 2012-02-06 ENCOUNTER — Encounter (HOSPITAL_COMMUNITY)
Admission: RE | Admit: 2012-02-06 | Discharge: 2012-02-06 | Disposition: A | Discharge status: Home / Self Care | Payer: Medicare Other | Source: Ambulatory Visit | Attending: Internal Medicine | Admitting: Internal Medicine

## 2012-02-06 DIAGNOSIS — N289 Disorder of kidney and ureter, unspecified: Secondary | ICD-10-CM | POA: Insufficient documentation

## 2012-02-06 DIAGNOSIS — I517 Cardiomegaly: Secondary | ICD-10-CM | POA: Insufficient documentation

## 2012-02-06 DIAGNOSIS — D1779 Benign lipomatous neoplasm of other sites: Secondary | ICD-10-CM | POA: Insufficient documentation

## 2012-02-06 DIAGNOSIS — C7800 Secondary malignant neoplasm of unspecified lung: Secondary | ICD-10-CM | POA: Insufficient documentation

## 2012-02-06 DIAGNOSIS — Z4789 Encounter for other orthopedic aftercare: Secondary | ICD-10-CM | POA: Insufficient documentation

## 2012-02-06 DIAGNOSIS — J438 Other emphysema: Secondary | ICD-10-CM | POA: Insufficient documentation

## 2012-02-06 DIAGNOSIS — I2510 Atherosclerotic heart disease of native coronary artery without angina pectoris: Secondary | ICD-10-CM | POA: Insufficient documentation

## 2012-02-06 DIAGNOSIS — C3490 Malignant neoplasm of unspecified part of unspecified bronchus or lung: Secondary | ICD-10-CM

## 2012-02-06 DIAGNOSIS — I714 Abdominal aortic aneurysm, without rupture, unspecified: Secondary | ICD-10-CM | POA: Insufficient documentation

## 2012-02-06 DIAGNOSIS — C259 Malignant neoplasm of pancreas, unspecified: Secondary | ICD-10-CM | POA: Insufficient documentation

## 2012-02-06 DIAGNOSIS — R918 Other nonspecific abnormal finding of lung field: Secondary | ICD-10-CM | POA: Insufficient documentation

## 2012-02-06 DIAGNOSIS — Z90710 Acquired absence of both cervix and uterus: Secondary | ICD-10-CM | POA: Insufficient documentation

## 2012-02-06 DIAGNOSIS — M899 Disorder of bone, unspecified: Secondary | ICD-10-CM | POA: Insufficient documentation

## 2012-02-06 MED ORDER — FLUDEOXYGLUCOSE F - 18 (FDG) INJECTION
19.6000 | Freq: Once | INTRAVENOUS | Status: AC | PRN
  Administered 2012-02-06: 19.6 via INTRAVENOUS

## 2012-02-07 DIAGNOSIS — K219 Gastro-esophageal reflux disease without esophagitis: Secondary | ICD-10-CM

## 2012-02-07 DIAGNOSIS — C3490 Malignant neoplasm of unspecified part of unspecified bronchus or lung: Secondary | ICD-10-CM

## 2012-02-07 DIAGNOSIS — C259 Malignant neoplasm of pancreas, unspecified: Secondary | ICD-10-CM

## 2012-02-07 DIAGNOSIS — I10 Essential (primary) hypertension: Secondary | ICD-10-CM

## 2012-04-03 DIAGNOSIS — C259 Malignant neoplasm of pancreas, unspecified: Secondary | ICD-10-CM

## 2012-04-03 DIAGNOSIS — Z452 Encounter for adjustment and management of vascular access device: Secondary | ICD-10-CM

## 2012-06-05 ENCOUNTER — Encounter: Payer: Medicare Other | Admitting: Internal Medicine

## 2012-06-05 DIAGNOSIS — C3490 Malignant neoplasm of unspecified part of unspecified bronchus or lung: Secondary | ICD-10-CM

## 2012-06-05 DIAGNOSIS — C259 Malignant neoplasm of pancreas, unspecified: Secondary | ICD-10-CM

## 2012-06-05 DIAGNOSIS — R918 Other nonspecific abnormal finding of lung field: Secondary | ICD-10-CM

## 2012-06-25 DIAGNOSIS — C3490 Malignant neoplasm of unspecified part of unspecified bronchus or lung: Secondary | ICD-10-CM

## 2012-06-25 DIAGNOSIS — J329 Chronic sinusitis, unspecified: Secondary | ICD-10-CM

## 2012-06-25 DIAGNOSIS — R42 Dizziness and giddiness: Secondary | ICD-10-CM

## 2012-07-17 DIAGNOSIS — C259 Malignant neoplasm of pancreas, unspecified: Secondary | ICD-10-CM

## 2012-07-17 DIAGNOSIS — Z452 Encounter for adjustment and management of vascular access device: Secondary | ICD-10-CM

## 2012-08-28 DIAGNOSIS — Z452 Encounter for adjustment and management of vascular access device: Secondary | ICD-10-CM

## 2012-08-28 DIAGNOSIS — C3490 Malignant neoplasm of unspecified part of unspecified bronchus or lung: Secondary | ICD-10-CM

## 2012-08-30 DIAGNOSIS — C259 Malignant neoplasm of pancreas, unspecified: Secondary | ICD-10-CM

## 2012-08-30 DIAGNOSIS — C3490 Malignant neoplasm of unspecified part of unspecified bronchus or lung: Secondary | ICD-10-CM

## 2012-09-03 DIAGNOSIS — C3490 Malignant neoplasm of unspecified part of unspecified bronchus or lung: Secondary | ICD-10-CM

## 2012-10-11 DIAGNOSIS — C259 Malignant neoplasm of pancreas, unspecified: Secondary | ICD-10-CM

## 2012-10-23 ENCOUNTER — Other Ambulatory Visit (HOSPITAL_COMMUNITY): Payer: Self-pay | Admitting: Hematology and Oncology

## 2012-10-23 DIAGNOSIS — C3490 Malignant neoplasm of unspecified part of unspecified bronchus or lung: Secondary | ICD-10-CM

## 2012-10-23 DIAGNOSIS — C259 Malignant neoplasm of pancreas, unspecified: Secondary | ICD-10-CM

## 2012-10-23 DIAGNOSIS — R42 Dizziness and giddiness: Secondary | ICD-10-CM

## 2012-10-30 ENCOUNTER — Encounter (HOSPITAL_COMMUNITY)
Admission: RE | Admit: 2012-10-30 | Discharge: 2012-10-30 | Disposition: A | Discharge status: Home / Self Care | Payer: Medicare Other | Source: Ambulatory Visit | Attending: Hematology and Oncology | Admitting: Hematology and Oncology

## 2012-10-30 DIAGNOSIS — C3490 Malignant neoplasm of unspecified part of unspecified bronchus or lung: Secondary | ICD-10-CM | POA: Insufficient documentation

## 2012-10-30 MED ORDER — FLUDEOXYGLUCOSE F - 18 (FDG) INJECTION
16.0000 | Freq: Once | INTRAVENOUS | Status: AC | PRN
  Administered 2012-10-30: 16 via INTRAVENOUS

## 2012-11-13 DIAGNOSIS — C7800 Secondary malignant neoplasm of unspecified lung: Secondary | ICD-10-CM

## 2012-11-13 DIAGNOSIS — C259 Malignant neoplasm of pancreas, unspecified: Secondary | ICD-10-CM

## 2012-11-21 ENCOUNTER — Other Ambulatory Visit (HOSPITAL_COMMUNITY): Payer: Self-pay | Admitting: Interventional Radiology

## 2012-11-21 DIAGNOSIS — C259 Malignant neoplasm of pancreas, unspecified: Secondary | ICD-10-CM

## 2012-11-21 DIAGNOSIS — D696 Thrombocytopenia, unspecified: Secondary | ICD-10-CM

## 2012-11-22 ENCOUNTER — Ambulatory Visit (HOSPITAL_COMMUNITY)
Admission: RE | Admit: 2012-11-22 | Discharge: 2012-11-22 | Disposition: A | Discharge status: Home / Self Care | Payer: Medicare Other | Source: Ambulatory Visit | Attending: Interventional Radiology | Admitting: Interventional Radiology

## 2012-11-22 DIAGNOSIS — C3490 Malignant neoplasm of unspecified part of unspecified bronchus or lung: Secondary | ICD-10-CM | POA: Insufficient documentation

## 2012-11-22 DIAGNOSIS — Z538 Procedure and treatment not carried out for other reasons: Secondary | ICD-10-CM | POA: Insufficient documentation

## 2012-11-22 MED ORDER — ALTEPLASE 100 MG IV SOLR
2.0000 mg | Freq: Once | INTRAVENOUS | Status: DC
  Filled 2012-11-22: qty 2

## 2012-11-22 MED ORDER — SODIUM CHLORIDE 0.9 % IJ SOLN
10.0000 mL | INTRAMUSCULAR | Status: DC | PRN
  Administered 2012-11-22: 30 mL

## 2012-11-22 MED ORDER — SODIUM CHLORIDE 0.9 % IV SOLN
5.0000 mg | Freq: Once | INTRAVENOUS | Status: AC
  Administered 2012-11-22: 5 mg via INTRAVENOUS
  Filled 2012-11-22: qty 5

## 2012-11-22 MED ORDER — HEPARIN SOD (PORK) LOCK FLUSH 100 UNIT/ML IV SOLN
500.0000 [IU] | Freq: Once | INTRAVENOUS | Status: DC
  Filled 2012-11-22: qty 5

## 2012-11-22 MED ORDER — HEPARIN SOD (PORK) LOCK FLUSH 100 UNIT/ML IV SOLN
INTRAVENOUS | Status: AC
  Filled 2012-11-22: qty 5

## 2012-11-22 NOTE — Progress Notes (Signed)
DR HOSS NOTIFIED OF 2 IV THERAPY NURSES HERE AND PAC WITH GOOD BLOOD RETURN AND FLUSHES EASILY PER IV THERAPY NURSES; ALSO NOTIFIED OF SITE BEING RED AND NO C/O TENDERNESS AT SITE; NO NEW ORDERS

## 2012-11-22 NOTE — Progress Notes (Signed)
PAC FLUSHED WITH NSS AND HEPARIN 100U/ML X AND HUBER NEEDLE D/CED INTACT SITE PINK

## 2012-11-22 NOTE — Progress Notes (Signed)
NOTIFIED DR Fredia Sorrow THAT CLIENT HERE FOR TPA INFUSION AND SITE RED AND NO C/O OF TENDERNESS; NO NEW ORDERES NOTED

## 2012-11-27 DIAGNOSIS — C259 Malignant neoplasm of pancreas, unspecified: Secondary | ICD-10-CM

## 2012-12-10 DIAGNOSIS — Z5111 Encounter for antineoplastic chemotherapy: Secondary | ICD-10-CM

## 2012-12-10 DIAGNOSIS — C259 Malignant neoplasm of pancreas, unspecified: Secondary | ICD-10-CM

## 2012-12-17 DIAGNOSIS — R110 Nausea: Secondary | ICD-10-CM

## 2012-12-17 DIAGNOSIS — Z23 Encounter for immunization: Secondary | ICD-10-CM

## 2012-12-17 DIAGNOSIS — C259 Malignant neoplasm of pancreas, unspecified: Secondary | ICD-10-CM

## 2012-12-17 DIAGNOSIS — R911 Solitary pulmonary nodule: Secondary | ICD-10-CM

## 2012-12-17 DIAGNOSIS — C7800 Secondary malignant neoplasm of unspecified lung: Secondary | ICD-10-CM

## 2012-12-24 DIAGNOSIS — C259 Malignant neoplasm of pancreas, unspecified: Secondary | ICD-10-CM

## 2012-12-24 DIAGNOSIS — Z5111 Encounter for antineoplastic chemotherapy: Secondary | ICD-10-CM

## 2013-01-07 DIAGNOSIS — T451X5A Adverse effect of antineoplastic and immunosuppressive drugs, initial encounter: Secondary | ICD-10-CM

## 2013-01-07 DIAGNOSIS — C7800 Secondary malignant neoplasm of unspecified lung: Secondary | ICD-10-CM

## 2013-01-07 DIAGNOSIS — D6959 Other secondary thrombocytopenia: Secondary | ICD-10-CM

## 2013-01-07 DIAGNOSIS — C801 Malignant (primary) neoplasm, unspecified: Secondary | ICD-10-CM

## 2013-01-07 DIAGNOSIS — E876 Hypokalemia: Secondary | ICD-10-CM

## 2013-01-07 DIAGNOSIS — C7889 Secondary malignant neoplasm of other digestive organs: Secondary | ICD-10-CM

## 2013-01-07 DIAGNOSIS — D6481 Anemia due to antineoplastic chemotherapy: Secondary | ICD-10-CM

## 2013-01-14 DIAGNOSIS — Z5111 Encounter for antineoplastic chemotherapy: Secondary | ICD-10-CM

## 2013-01-14 DIAGNOSIS — C259 Malignant neoplasm of pancreas, unspecified: Secondary | ICD-10-CM

## 2013-01-28 DIAGNOSIS — D72819 Decreased white blood cell count, unspecified: Secondary | ICD-10-CM

## 2013-01-28 DIAGNOSIS — C259 Malignant neoplasm of pancreas, unspecified: Secondary | ICD-10-CM

## 2013-01-28 DIAGNOSIS — C3490 Malignant neoplasm of unspecified part of unspecified bronchus or lung: Secondary | ICD-10-CM

## 2013-01-28 DIAGNOSIS — D696 Thrombocytopenia, unspecified: Secondary | ICD-10-CM

## 2013-01-28 DIAGNOSIS — Z5111 Encounter for antineoplastic chemotherapy: Secondary | ICD-10-CM

## 2013-01-29 DIAGNOSIS — D702 Other drug-induced agranulocytosis: Secondary | ICD-10-CM

## 2013-01-29 DIAGNOSIS — T451X5A Adverse effect of antineoplastic and immunosuppressive drugs, initial encounter: Secondary | ICD-10-CM

## 2013-01-30 DIAGNOSIS — D709 Neutropenia, unspecified: Secondary | ICD-10-CM

## 2013-01-31 DIAGNOSIS — D709 Neutropenia, unspecified: Secondary | ICD-10-CM

## 2013-06-20 ENCOUNTER — Other Ambulatory Visit (HOSPITAL_COMMUNITY): Payer: Self-pay | Admitting: Internal Medicine

## 2013-06-20 DIAGNOSIS — C259 Malignant neoplasm of pancreas, unspecified: Secondary | ICD-10-CM

## 2013-07-04 ENCOUNTER — Other Ambulatory Visit (HOSPITAL_COMMUNITY): Payer: Self-pay | Admitting: Internal Medicine

## 2013-07-04 DIAGNOSIS — C3490 Malignant neoplasm of unspecified part of unspecified bronchus or lung: Secondary | ICD-10-CM

## 2013-07-04 DIAGNOSIS — C259 Malignant neoplasm of pancreas, unspecified: Secondary | ICD-10-CM

## 2013-07-05 ENCOUNTER — Encounter (HOSPITAL_COMMUNITY): Payer: Self-pay

## 2013-07-05 ENCOUNTER — Ambulatory Visit (HOSPITAL_COMMUNITY)
Admission: RE | Admit: 2013-07-05 | Discharge: 2013-07-05 | Disposition: A | Discharge status: Home / Self Care | Payer: Medicare Other | Source: Ambulatory Visit | Attending: Internal Medicine | Admitting: Internal Medicine

## 2013-07-05 DIAGNOSIS — C3490 Malignant neoplasm of unspecified part of unspecified bronchus or lung: Secondary | ICD-10-CM

## 2013-07-05 DIAGNOSIS — C259 Malignant neoplasm of pancreas, unspecified: Secondary | ICD-10-CM | POA: Insufficient documentation

## 2013-07-05 DIAGNOSIS — I700 Atherosclerosis of aorta: Secondary | ICD-10-CM | POA: Insufficient documentation

## 2013-07-05 DIAGNOSIS — K439 Ventral hernia without obstruction or gangrene: Secondary | ICD-10-CM | POA: Insufficient documentation

## 2013-07-05 LAB — GLUCOSE, CAPILLARY: Glucose-Capillary: 84 mg/dL (ref 70–99)

## 2013-07-05 MED ORDER — FLUDEOXYGLUCOSE F - 18 (FDG) INJECTION
11.0000 | Freq: Once | INTRAVENOUS | Status: AC | PRN
  Administered 2013-07-05: 11 via INTRAVENOUS

## 2014-01-09 ENCOUNTER — Other Ambulatory Visit (HOSPITAL_COMMUNITY): Payer: Self-pay | Admitting: Internal Medicine

## 2014-01-09 DIAGNOSIS — Z8507 Personal history of malignant neoplasm of pancreas: Secondary | ICD-10-CM

## 2014-03-03 ENCOUNTER — Ambulatory Visit (HOSPITAL_COMMUNITY): Payer: Medicare Other

## 2014-03-31 DIAGNOSIS — Z95828 Presence of other vascular implants and grafts: Secondary | ICD-10-CM | POA: Diagnosis not present

## 2014-04-03 ENCOUNTER — Ambulatory Visit (HOSPITAL_COMMUNITY)
Admission: RE | Admit: 2014-04-03 | Discharge: 2014-04-03 | Disposition: A | Discharge status: Home / Self Care | Payer: Medicare Other | Source: Ambulatory Visit | Attending: Internal Medicine | Admitting: Internal Medicine

## 2014-04-03 DIAGNOSIS — I517 Cardiomegaly: Secondary | ICD-10-CM | POA: Insufficient documentation

## 2014-04-03 DIAGNOSIS — I77811 Abdominal aortic ectasia: Secondary | ICD-10-CM | POA: Diagnosis not present

## 2014-04-03 DIAGNOSIS — J432 Centrilobular emphysema: Secondary | ICD-10-CM | POA: Diagnosis not present

## 2014-04-03 DIAGNOSIS — Z8507 Personal history of malignant neoplasm of pancreas: Secondary | ICD-10-CM | POA: Diagnosis not present

## 2014-04-03 DIAGNOSIS — K439 Ventral hernia without obstruction or gangrene: Secondary | ICD-10-CM | POA: Insufficient documentation

## 2014-04-03 DIAGNOSIS — C3490 Malignant neoplasm of unspecified part of unspecified bronchus or lung: Secondary | ICD-10-CM | POA: Diagnosis not present

## 2014-04-03 DIAGNOSIS — E041 Nontoxic single thyroid nodule: Secondary | ICD-10-CM | POA: Insufficient documentation

## 2014-04-03 DIAGNOSIS — R938 Abnormal findings on diagnostic imaging of other specified body structures: Secondary | ICD-10-CM | POA: Insufficient documentation

## 2014-04-03 DIAGNOSIS — Z90411 Acquired partial absence of pancreas: Secondary | ICD-10-CM | POA: Diagnosis not present

## 2014-04-03 DIAGNOSIS — C259 Malignant neoplasm of pancreas, unspecified: Secondary | ICD-10-CM | POA: Diagnosis not present

## 2014-04-03 LAB — GLUCOSE, CAPILLARY: GLUCOSE-CAPILLARY: 106 mg/dL — AB (ref 70–99)

## 2014-04-03 MED ORDER — FLUDEOXYGLUCOSE F - 18 (FDG) INJECTION
7.0000 | Freq: Once | INTRAVENOUS | Status: AC | PRN
  Administered 2014-04-03: 7 via INTRAVENOUS

## 2014-04-04 DIAGNOSIS — N182 Chronic kidney disease, stage 2 (mild): Secondary | ICD-10-CM | POA: Diagnosis not present

## 2014-04-04 DIAGNOSIS — Z95828 Presence of other vascular implants and grafts: Secondary | ICD-10-CM | POA: Diagnosis not present

## 2014-04-04 DIAGNOSIS — C3490 Malignant neoplasm of unspecified part of unspecified bronchus or lung: Secondary | ICD-10-CM | POA: Diagnosis not present

## 2014-04-04 DIAGNOSIS — K903 Pancreatic steatorrhea: Secondary | ICD-10-CM | POA: Diagnosis not present

## 2014-04-04 DIAGNOSIS — D631 Anemia in chronic kidney disease: Secondary | ICD-10-CM | POA: Diagnosis not present

## 2014-04-04 DIAGNOSIS — C258 Malignant neoplasm of overlapping sites of pancreas: Secondary | ICD-10-CM | POA: Diagnosis not present

## 2014-04-04 DIAGNOSIS — R978 Other abnormal tumor markers: Secondary | ICD-10-CM | POA: Diagnosis not present

## 2014-05-12 DIAGNOSIS — Z95828 Presence of other vascular implants and grafts: Secondary | ICD-10-CM | POA: Diagnosis not present

## 2014-05-14 DIAGNOSIS — E780 Pure hypercholesterolemia: Secondary | ICD-10-CM | POA: Diagnosis not present

## 2014-05-14 DIAGNOSIS — Z1389 Encounter for screening for other disorder: Secondary | ICD-10-CM | POA: Diagnosis not present

## 2014-05-14 DIAGNOSIS — R5383 Other fatigue: Secondary | ICD-10-CM | POA: Diagnosis not present

## 2014-05-14 DIAGNOSIS — Z1211 Encounter for screening for malignant neoplasm of colon: Secondary | ICD-10-CM | POA: Diagnosis not present

## 2014-05-14 DIAGNOSIS — Z418 Encounter for other procedures for purposes other than remedying health state: Secondary | ICD-10-CM | POA: Diagnosis not present

## 2014-05-14 DIAGNOSIS — Z6824 Body mass index (BMI) 24.0-24.9, adult: Secondary | ICD-10-CM | POA: Diagnosis not present

## 2014-05-14 DIAGNOSIS — Z0000 Encounter for general adult medical examination without abnormal findings: Secondary | ICD-10-CM | POA: Diagnosis not present

## 2014-05-14 DIAGNOSIS — Z87891 Personal history of nicotine dependence: Secondary | ICD-10-CM | POA: Diagnosis not present

## 2014-06-10 DIAGNOSIS — W19XXXA Unspecified fall, initial encounter: Secondary | ICD-10-CM | POA: Diagnosis not present

## 2014-06-10 DIAGNOSIS — Z418 Encounter for other procedures for purposes other than remedying health state: Secondary | ICD-10-CM | POA: Diagnosis not present

## 2014-06-10 DIAGNOSIS — I10 Essential (primary) hypertension: Secondary | ICD-10-CM | POA: Diagnosis not present

## 2014-06-10 DIAGNOSIS — Z6824 Body mass index (BMI) 24.0-24.9, adult: Secondary | ICD-10-CM | POA: Diagnosis not present

## 2014-06-10 DIAGNOSIS — S51812A Laceration without foreign body of left forearm, initial encounter: Secondary | ICD-10-CM | POA: Diagnosis not present

## 2014-06-17 DIAGNOSIS — Z87891 Personal history of nicotine dependence: Secondary | ICD-10-CM | POA: Diagnosis not present

## 2014-06-17 DIAGNOSIS — L03119 Cellulitis of unspecified part of limb: Secondary | ICD-10-CM | POA: Diagnosis not present

## 2014-06-17 DIAGNOSIS — Z6824 Body mass index (BMI) 24.0-24.9, adult: Secondary | ICD-10-CM | POA: Diagnosis not present

## 2014-06-17 DIAGNOSIS — I10 Essential (primary) hypertension: Secondary | ICD-10-CM | POA: Diagnosis not present

## 2014-06-25 DIAGNOSIS — L570 Actinic keratosis: Secondary | ICD-10-CM | POA: Diagnosis not present

## 2014-06-25 DIAGNOSIS — L821 Other seborrheic keratosis: Secondary | ICD-10-CM | POA: Diagnosis not present

## 2014-07-02 DIAGNOSIS — C3490 Malignant neoplasm of unspecified part of unspecified bronchus or lung: Secondary | ICD-10-CM | POA: Diagnosis not present

## 2014-07-02 DIAGNOSIS — D631 Anemia in chronic kidney disease: Secondary | ICD-10-CM | POA: Diagnosis not present

## 2014-07-02 DIAGNOSIS — I714 Abdominal aortic aneurysm, without rupture: Secondary | ICD-10-CM | POA: Diagnosis not present

## 2014-07-02 DIAGNOSIS — C7800 Secondary malignant neoplasm of unspecified lung: Secondary | ICD-10-CM | POA: Diagnosis not present

## 2014-07-02 DIAGNOSIS — K903 Pancreatic steatorrhea: Secondary | ICD-10-CM | POA: Diagnosis not present

## 2014-07-02 DIAGNOSIS — N182 Chronic kidney disease, stage 2 (mild): Secondary | ICD-10-CM | POA: Diagnosis not present

## 2014-07-02 DIAGNOSIS — C258 Malignant neoplasm of overlapping sites of pancreas: Secondary | ICD-10-CM | POA: Diagnosis not present

## 2014-07-02 DIAGNOSIS — I700 Atherosclerosis of aorta: Secondary | ICD-10-CM | POA: Diagnosis not present

## 2014-07-04 ENCOUNTER — Other Ambulatory Visit (HOSPITAL_COMMUNITY): Payer: Self-pay | Admitting: Internal Medicine

## 2014-07-04 DIAGNOSIS — D638 Anemia in other chronic diseases classified elsewhere: Secondary | ICD-10-CM | POA: Diagnosis not present

## 2014-07-04 DIAGNOSIS — C7800 Secondary malignant neoplasm of unspecified lung: Principal | ICD-10-CM

## 2014-07-04 DIAGNOSIS — Z95828 Presence of other vascular implants and grafts: Secondary | ICD-10-CM | POA: Diagnosis not present

## 2014-07-04 DIAGNOSIS — C3490 Malignant neoplasm of unspecified part of unspecified bronchus or lung: Secondary | ICD-10-CM | POA: Diagnosis not present

## 2014-07-04 DIAGNOSIS — C259 Malignant neoplasm of pancreas, unspecified: Secondary | ICD-10-CM | POA: Diagnosis not present

## 2014-07-08 DIAGNOSIS — E2839 Other primary ovarian failure: Secondary | ICD-10-CM | POA: Diagnosis not present

## 2014-07-15 ENCOUNTER — Ambulatory Visit (HOSPITAL_COMMUNITY)
Admission: RE | Admit: 2014-07-15 | Discharge: 2014-07-15 | Disposition: A | Discharge status: Home / Self Care | Payer: Medicare Other | Source: Ambulatory Visit | Attending: Internal Medicine | Admitting: Internal Medicine

## 2014-07-15 DIAGNOSIS — I714 Abdominal aortic aneurysm, without rupture: Secondary | ICD-10-CM | POA: Insufficient documentation

## 2014-07-15 DIAGNOSIS — J439 Emphysema, unspecified: Secondary | ICD-10-CM | POA: Diagnosis not present

## 2014-07-15 DIAGNOSIS — I700 Atherosclerosis of aorta: Secondary | ICD-10-CM | POA: Insufficient documentation

## 2014-07-15 DIAGNOSIS — C7800 Secondary malignant neoplasm of unspecified lung: Secondary | ICD-10-CM | POA: Insufficient documentation

## 2014-07-15 DIAGNOSIS — C259 Malignant neoplasm of pancreas, unspecified: Secondary | ICD-10-CM | POA: Insufficient documentation

## 2014-07-15 LAB — GLUCOSE, CAPILLARY: GLUCOSE-CAPILLARY: 94 mg/dL (ref 65–99)

## 2014-07-15 MED ORDER — FLUDEOXYGLUCOSE F - 18 (FDG) INJECTION
7.9900 | Freq: Once | INTRAVENOUS | Status: AC | PRN
  Administered 2014-07-15: 7.99 via INTRAVENOUS

## 2014-07-23 DIAGNOSIS — C3490 Malignant neoplasm of unspecified part of unspecified bronchus or lung: Secondary | ICD-10-CM | POA: Diagnosis not present

## 2014-07-23 DIAGNOSIS — R978 Other abnormal tumor markers: Secondary | ICD-10-CM | POA: Diagnosis not present

## 2014-07-23 DIAGNOSIS — C7800 Secondary malignant neoplasm of unspecified lung: Secondary | ICD-10-CM | POA: Diagnosis not present

## 2014-07-23 DIAGNOSIS — C259 Malignant neoplasm of pancreas, unspecified: Secondary | ICD-10-CM | POA: Diagnosis not present

## 2014-07-23 DIAGNOSIS — D519 Vitamin B12 deficiency anemia, unspecified: Secondary | ICD-10-CM | POA: Diagnosis not present

## 2014-07-23 DIAGNOSIS — N189 Chronic kidney disease, unspecified: Secondary | ICD-10-CM | POA: Diagnosis not present

## 2014-07-23 DIAGNOSIS — C250 Malignant neoplasm of head of pancreas: Secondary | ICD-10-CM | POA: Diagnosis not present

## 2014-07-23 DIAGNOSIS — D631 Anemia in chronic kidney disease: Secondary | ICD-10-CM | POA: Diagnosis not present

## 2014-07-30 DIAGNOSIS — C259 Malignant neoplasm of pancreas, unspecified: Secondary | ICD-10-CM | POA: Diagnosis not present

## 2014-07-30 DIAGNOSIS — R112 Nausea with vomiting, unspecified: Secondary | ICD-10-CM | POA: Diagnosis not present

## 2014-07-30 DIAGNOSIS — D701 Agranulocytosis secondary to cancer chemotherapy: Secondary | ICD-10-CM | POA: Diagnosis not present

## 2014-07-30 DIAGNOSIS — C7800 Secondary malignant neoplasm of unspecified lung: Secondary | ICD-10-CM | POA: Diagnosis not present

## 2014-08-13 DIAGNOSIS — D701 Agranulocytosis secondary to cancer chemotherapy: Secondary | ICD-10-CM | POA: Diagnosis not present

## 2014-08-13 DIAGNOSIS — R112 Nausea with vomiting, unspecified: Secondary | ICD-10-CM | POA: Diagnosis not present

## 2014-08-13 DIAGNOSIS — T451X5A Adverse effect of antineoplastic and immunosuppressive drugs, initial encounter: Secondary | ICD-10-CM | POA: Diagnosis not present

## 2014-08-13 DIAGNOSIS — N182 Chronic kidney disease, stage 2 (mild): Secondary | ICD-10-CM | POA: Diagnosis not present

## 2014-08-13 DIAGNOSIS — C7800 Secondary malignant neoplasm of unspecified lung: Secondary | ICD-10-CM | POA: Diagnosis not present

## 2014-08-13 DIAGNOSIS — R978 Other abnormal tumor markers: Secondary | ICD-10-CM | POA: Diagnosis not present

## 2014-08-13 DIAGNOSIS — C259 Malignant neoplasm of pancreas, unspecified: Secondary | ICD-10-CM | POA: Diagnosis not present

## 2014-08-13 DIAGNOSIS — D519 Vitamin B12 deficiency anemia, unspecified: Secondary | ICD-10-CM | POA: Diagnosis not present

## 2014-08-14 DIAGNOSIS — D701 Agranulocytosis secondary to cancer chemotherapy: Secondary | ICD-10-CM | POA: Diagnosis not present

## 2014-08-14 DIAGNOSIS — C259 Malignant neoplasm of pancreas, unspecified: Secondary | ICD-10-CM | POA: Diagnosis not present

## 2014-08-14 DIAGNOSIS — C7800 Secondary malignant neoplasm of unspecified lung: Secondary | ICD-10-CM | POA: Diagnosis not present

## 2014-08-27 DIAGNOSIS — D701 Agranulocytosis secondary to cancer chemotherapy: Secondary | ICD-10-CM | POA: Diagnosis not present

## 2014-08-27 DIAGNOSIS — C3492 Malignant neoplasm of unspecified part of left bronchus or lung: Secondary | ICD-10-CM | POA: Diagnosis not present

## 2014-08-27 DIAGNOSIS — C3490 Malignant neoplasm of unspecified part of unspecified bronchus or lung: Secondary | ICD-10-CM | POA: Diagnosis not present

## 2014-08-27 DIAGNOSIS — C7800 Secondary malignant neoplasm of unspecified lung: Secondary | ICD-10-CM | POA: Diagnosis not present

## 2014-08-27 DIAGNOSIS — C259 Malignant neoplasm of pancreas, unspecified: Secondary | ICD-10-CM | POA: Diagnosis not present

## 2014-08-27 DIAGNOSIS — R112 Nausea with vomiting, unspecified: Secondary | ICD-10-CM | POA: Diagnosis not present

## 2014-09-10 DIAGNOSIS — R112 Nausea with vomiting, unspecified: Secondary | ICD-10-CM | POA: Diagnosis not present

## 2014-09-10 DIAGNOSIS — C3492 Malignant neoplasm of unspecified part of left bronchus or lung: Secondary | ICD-10-CM | POA: Diagnosis not present

## 2014-09-10 DIAGNOSIS — C7800 Secondary malignant neoplasm of unspecified lung: Secondary | ICD-10-CM | POA: Diagnosis not present

## 2014-09-10 DIAGNOSIS — C259 Malignant neoplasm of pancreas, unspecified: Secondary | ICD-10-CM | POA: Diagnosis not present

## 2014-09-10 DIAGNOSIS — C250 Malignant neoplasm of head of pancreas: Secondary | ICD-10-CM | POA: Diagnosis not present

## 2014-09-11 DIAGNOSIS — C259 Malignant neoplasm of pancreas, unspecified: Secondary | ICD-10-CM | POA: Diagnosis not present

## 2014-09-11 DIAGNOSIS — D701 Agranulocytosis secondary to cancer chemotherapy: Secondary | ICD-10-CM | POA: Diagnosis not present

## 2014-09-11 DIAGNOSIS — C7800 Secondary malignant neoplasm of unspecified lung: Secondary | ICD-10-CM | POA: Diagnosis not present

## 2014-09-17 DIAGNOSIS — C259 Malignant neoplasm of pancreas, unspecified: Secondary | ICD-10-CM | POA: Diagnosis not present

## 2014-09-17 DIAGNOSIS — C7800 Secondary malignant neoplasm of unspecified lung: Secondary | ICD-10-CM | POA: Diagnosis not present

## 2014-09-17 DIAGNOSIS — I10 Essential (primary) hypertension: Secondary | ICD-10-CM | POA: Diagnosis not present

## 2014-09-17 DIAGNOSIS — C3492 Malignant neoplasm of unspecified part of left bronchus or lung: Secondary | ICD-10-CM | POA: Diagnosis not present

## 2014-09-17 DIAGNOSIS — E538 Deficiency of other specified B group vitamins: Secondary | ICD-10-CM | POA: Diagnosis not present

## 2014-09-17 DIAGNOSIS — R5383 Other fatigue: Secondary | ICD-10-CM | POA: Diagnosis not present

## 2014-09-17 DIAGNOSIS — T451X5A Adverse effect of antineoplastic and immunosuppressive drugs, initial encounter: Secondary | ICD-10-CM | POA: Diagnosis not present

## 2014-09-17 DIAGNOSIS — D701 Agranulocytosis secondary to cancer chemotherapy: Secondary | ICD-10-CM | POA: Diagnosis not present

## 2014-09-24 DIAGNOSIS — R0602 Shortness of breath: Secondary | ICD-10-CM | POA: Diagnosis not present

## 2014-09-24 DIAGNOSIS — C3492 Malignant neoplasm of unspecified part of left bronchus or lung: Secondary | ICD-10-CM | POA: Diagnosis not present

## 2014-09-24 DIAGNOSIS — C3490 Malignant neoplasm of unspecified part of unspecified bronchus or lung: Secondary | ICD-10-CM | POA: Diagnosis not present

## 2014-09-24 DIAGNOSIS — D638 Anemia in other chronic diseases classified elsewhere: Secondary | ICD-10-CM | POA: Diagnosis not present

## 2014-09-24 DIAGNOSIS — C7800 Secondary malignant neoplasm of unspecified lung: Secondary | ICD-10-CM | POA: Diagnosis not present

## 2014-09-24 DIAGNOSIS — C259 Malignant neoplasm of pancreas, unspecified: Secondary | ICD-10-CM | POA: Diagnosis not present

## 2014-09-24 DIAGNOSIS — R600 Localized edema: Secondary | ICD-10-CM | POA: Diagnosis not present

## 2014-09-24 DIAGNOSIS — Z95828 Presence of other vascular implants and grafts: Secondary | ICD-10-CM | POA: Diagnosis not present

## 2014-09-25 DIAGNOSIS — C3492 Malignant neoplasm of unspecified part of left bronchus or lung: Secondary | ICD-10-CM | POA: Diagnosis not present

## 2014-09-25 DIAGNOSIS — C259 Malignant neoplasm of pancreas, unspecified: Secondary | ICD-10-CM | POA: Diagnosis not present

## 2014-09-25 DIAGNOSIS — C7800 Secondary malignant neoplasm of unspecified lung: Secondary | ICD-10-CM | POA: Diagnosis not present

## 2014-09-25 DIAGNOSIS — R600 Localized edema: Secondary | ICD-10-CM | POA: Diagnosis not present

## 2014-09-25 DIAGNOSIS — Z95828 Presence of other vascular implants and grafts: Secondary | ICD-10-CM | POA: Diagnosis not present

## 2014-09-25 DIAGNOSIS — S2231XA Fracture of one rib, right side, initial encounter for closed fracture: Secondary | ICD-10-CM | POA: Diagnosis not present

## 2014-09-25 DIAGNOSIS — R0602 Shortness of breath: Secondary | ICD-10-CM | POA: Diagnosis not present

## 2014-10-08 DIAGNOSIS — C3492 Malignant neoplasm of unspecified part of left bronchus or lung: Secondary | ICD-10-CM | POA: Diagnosis not present

## 2014-10-08 DIAGNOSIS — C7800 Secondary malignant neoplasm of unspecified lung: Secondary | ICD-10-CM | POA: Diagnosis not present

## 2014-10-08 DIAGNOSIS — C259 Malignant neoplasm of pancreas, unspecified: Secondary | ICD-10-CM | POA: Diagnosis not present

## 2014-10-08 DIAGNOSIS — N182 Chronic kidney disease, stage 2 (mild): Secondary | ICD-10-CM | POA: Diagnosis not present

## 2014-10-08 DIAGNOSIS — Z95828 Presence of other vascular implants and grafts: Secondary | ICD-10-CM | POA: Diagnosis not present

## 2014-10-22 DIAGNOSIS — D519 Vitamin B12 deficiency anemia, unspecified: Secondary | ICD-10-CM | POA: Diagnosis not present

## 2014-10-22 DIAGNOSIS — E538 Deficiency of other specified B group vitamins: Secondary | ICD-10-CM | POA: Diagnosis not present

## 2014-10-22 DIAGNOSIS — N182 Chronic kidney disease, stage 2 (mild): Secondary | ICD-10-CM | POA: Diagnosis not present

## 2014-10-22 DIAGNOSIS — C259 Malignant neoplasm of pancreas, unspecified: Secondary | ICD-10-CM | POA: Diagnosis not present

## 2014-11-13 DIAGNOSIS — C259 Malignant neoplasm of pancreas, unspecified: Secondary | ICD-10-CM | POA: Diagnosis not present

## 2014-11-13 DIAGNOSIS — C7800 Secondary malignant neoplasm of unspecified lung: Secondary | ICD-10-CM | POA: Diagnosis not present

## 2014-11-13 DIAGNOSIS — C3492 Malignant neoplasm of unspecified part of left bronchus or lung: Secondary | ICD-10-CM | POA: Diagnosis not present

## 2014-11-19 DIAGNOSIS — C7800 Secondary malignant neoplasm of unspecified lung: Secondary | ICD-10-CM | POA: Diagnosis not present

## 2014-11-19 DIAGNOSIS — Z95828 Presence of other vascular implants and grafts: Secondary | ICD-10-CM | POA: Diagnosis not present

## 2014-11-19 DIAGNOSIS — D630 Anemia in neoplastic disease: Secondary | ICD-10-CM | POA: Diagnosis not present

## 2014-11-19 DIAGNOSIS — L049 Acute lymphadenitis, unspecified: Secondary | ICD-10-CM | POA: Diagnosis not present

## 2014-11-19 DIAGNOSIS — C3492 Malignant neoplasm of unspecified part of left bronchus or lung: Secondary | ICD-10-CM | POA: Diagnosis not present

## 2014-11-19 DIAGNOSIS — E538 Deficiency of other specified B group vitamins: Secondary | ICD-10-CM | POA: Diagnosis not present

## 2014-11-19 DIAGNOSIS — C259 Malignant neoplasm of pancreas, unspecified: Secondary | ICD-10-CM | POA: Diagnosis not present

## 2014-11-23 DIAGNOSIS — H8110 Benign paroxysmal vertigo, unspecified ear: Secondary | ICD-10-CM | POA: Diagnosis not present

## 2014-11-23 DIAGNOSIS — Z87891 Personal history of nicotine dependence: Secondary | ICD-10-CM | POA: Diagnosis not present

## 2014-11-23 DIAGNOSIS — Z7982 Long term (current) use of aspirin: Secondary | ICD-10-CM | POA: Diagnosis not present

## 2014-11-23 DIAGNOSIS — I10 Essential (primary) hypertension: Secondary | ICD-10-CM | POA: Diagnosis not present

## 2014-11-23 DIAGNOSIS — R112 Nausea with vomiting, unspecified: Secondary | ICD-10-CM | POA: Diagnosis not present

## 2014-11-23 DIAGNOSIS — R51 Headache: Secondary | ICD-10-CM | POA: Diagnosis not present

## 2014-11-23 DIAGNOSIS — R197 Diarrhea, unspecified: Secondary | ICD-10-CM | POA: Diagnosis not present

## 2014-11-23 DIAGNOSIS — R42 Dizziness and giddiness: Secondary | ICD-10-CM | POA: Diagnosis not present

## 2014-11-23 DIAGNOSIS — Z79899 Other long term (current) drug therapy: Secondary | ICD-10-CM | POA: Diagnosis not present

## 2014-11-23 DIAGNOSIS — J439 Emphysema, unspecified: Secondary | ICD-10-CM | POA: Diagnosis not present

## 2014-11-27 DIAGNOSIS — C7800 Secondary malignant neoplasm of unspecified lung: Secondary | ICD-10-CM | POA: Diagnosis not present

## 2014-11-27 DIAGNOSIS — R269 Unspecified abnormalities of gait and mobility: Secondary | ICD-10-CM | POA: Diagnosis not present

## 2014-11-27 DIAGNOSIS — C259 Malignant neoplasm of pancreas, unspecified: Secondary | ICD-10-CM | POA: Diagnosis not present

## 2014-11-27 DIAGNOSIS — C3490 Malignant neoplasm of unspecified part of unspecified bronchus or lung: Secondary | ICD-10-CM | POA: Diagnosis not present

## 2014-11-27 DIAGNOSIS — R0602 Shortness of breath: Secondary | ICD-10-CM | POA: Diagnosis not present

## 2014-11-27 DIAGNOSIS — Z95828 Presence of other vascular implants and grafts: Secondary | ICD-10-CM | POA: Diagnosis not present

## 2014-11-27 DIAGNOSIS — C3492 Malignant neoplasm of unspecified part of left bronchus or lung: Secondary | ICD-10-CM | POA: Diagnosis not present

## 2014-11-27 DIAGNOSIS — G44209 Tension-type headache, unspecified, not intractable: Secondary | ICD-10-CM | POA: Diagnosis not present

## 2014-11-27 DIAGNOSIS — G3281 Cerebellar ataxia in diseases classified elsewhere: Secondary | ICD-10-CM | POA: Diagnosis not present

## 2014-11-28 DIAGNOSIS — G08 Intracranial and intraspinal phlebitis and thrombophlebitis: Secondary | ICD-10-CM | POA: Diagnosis not present

## 2014-11-28 DIAGNOSIS — G3281 Cerebellar ataxia in diseases classified elsewhere: Secondary | ICD-10-CM | POA: Diagnosis not present

## 2014-11-28 DIAGNOSIS — C3490 Malignant neoplasm of unspecified part of unspecified bronchus or lung: Secondary | ICD-10-CM | POA: Diagnosis not present

## 2014-11-28 DIAGNOSIS — R51 Headache: Secondary | ICD-10-CM | POA: Diagnosis not present

## 2014-11-28 DIAGNOSIS — Z8507 Personal history of malignant neoplasm of pancreas: Secondary | ICD-10-CM | POA: Diagnosis not present

## 2014-11-28 DIAGNOSIS — I82C11 Acute embolism and thrombosis of right internal jugular vein: Secondary | ICD-10-CM | POA: Diagnosis not present

## 2014-12-22 DIAGNOSIS — C259 Malignant neoplasm of pancreas, unspecified: Secondary | ICD-10-CM | POA: Diagnosis not present

## 2014-12-22 DIAGNOSIS — K439 Ventral hernia without obstruction or gangrene: Secondary | ICD-10-CM | POA: Diagnosis not present

## 2014-12-22 DIAGNOSIS — E538 Deficiency of other specified B group vitamins: Secondary | ICD-10-CM | POA: Diagnosis not present

## 2014-12-22 DIAGNOSIS — D630 Anemia in neoplastic disease: Secondary | ICD-10-CM | POA: Diagnosis not present

## 2014-12-22 DIAGNOSIS — C7800 Secondary malignant neoplasm of unspecified lung: Secondary | ICD-10-CM | POA: Diagnosis not present

## 2014-12-22 DIAGNOSIS — Z9889 Other specified postprocedural states: Secondary | ICD-10-CM | POA: Diagnosis not present

## 2014-12-22 DIAGNOSIS — C3492 Malignant neoplasm of unspecified part of left bronchus or lung: Secondary | ICD-10-CM | POA: Diagnosis not present

## 2014-12-22 DIAGNOSIS — K769 Liver disease, unspecified: Secondary | ICD-10-CM | POA: Diagnosis not present

## 2014-12-22 DIAGNOSIS — I2510 Atherosclerotic heart disease of native coronary artery without angina pectoris: Secondary | ICD-10-CM | POA: Diagnosis not present

## 2014-12-22 DIAGNOSIS — I714 Abdominal aortic aneurysm, without rupture: Secondary | ICD-10-CM | POA: Diagnosis not present

## 2014-12-22 DIAGNOSIS — I82C11 Acute embolism and thrombosis of right internal jugular vein: Secondary | ICD-10-CM | POA: Diagnosis not present

## 2014-12-22 DIAGNOSIS — Z95828 Presence of other vascular implants and grafts: Secondary | ICD-10-CM | POA: Diagnosis not present

## 2014-12-24 DIAGNOSIS — Z5181 Encounter for therapeutic drug level monitoring: Secondary | ICD-10-CM | POA: Diagnosis not present

## 2014-12-24 DIAGNOSIS — E538 Deficiency of other specified B group vitamins: Secondary | ICD-10-CM | POA: Diagnosis not present

## 2014-12-24 DIAGNOSIS — R978 Other abnormal tumor markers: Secondary | ICD-10-CM | POA: Diagnosis not present

## 2014-12-24 DIAGNOSIS — R0602 Shortness of breath: Secondary | ICD-10-CM | POA: Diagnosis not present

## 2014-12-24 DIAGNOSIS — I82C11 Acute embolism and thrombosis of right internal jugular vein: Secondary | ICD-10-CM | POA: Diagnosis not present

## 2014-12-24 DIAGNOSIS — C259 Malignant neoplasm of pancreas, unspecified: Secondary | ICD-10-CM | POA: Diagnosis not present

## 2014-12-24 DIAGNOSIS — C3492 Malignant neoplasm of unspecified part of left bronchus or lung: Secondary | ICD-10-CM | POA: Diagnosis not present

## 2014-12-24 DIAGNOSIS — C7800 Secondary malignant neoplasm of unspecified lung: Secondary | ICD-10-CM | POA: Diagnosis not present

## 2014-12-24 DIAGNOSIS — D519 Vitamin B12 deficiency anemia, unspecified: Secondary | ICD-10-CM | POA: Diagnosis not present

## 2014-12-24 DIAGNOSIS — N182 Chronic kidney disease, stage 2 (mild): Secondary | ICD-10-CM | POA: Diagnosis not present

## 2014-12-24 DIAGNOSIS — G08 Intracranial and intraspinal phlebitis and thrombophlebitis: Secondary | ICD-10-CM | POA: Diagnosis not present

## 2014-12-24 DIAGNOSIS — Z7901 Long term (current) use of anticoagulants: Secondary | ICD-10-CM | POA: Diagnosis not present

## 2014-12-24 DIAGNOSIS — R5383 Other fatigue: Secondary | ICD-10-CM | POA: Diagnosis not present

## 2014-12-24 DIAGNOSIS — Z95828 Presence of other vascular implants and grafts: Secondary | ICD-10-CM | POA: Diagnosis not present

## 2015-01-27 DIAGNOSIS — Z23 Encounter for immunization: Secondary | ICD-10-CM | POA: Diagnosis not present

## 2015-02-11 DIAGNOSIS — Z5181 Encounter for therapeutic drug level monitoring: Secondary | ICD-10-CM | POA: Diagnosis not present

## 2015-02-11 DIAGNOSIS — Z95828 Presence of other vascular implants and grafts: Secondary | ICD-10-CM | POA: Diagnosis not present

## 2015-02-11 DIAGNOSIS — Z7901 Long term (current) use of anticoagulants: Secondary | ICD-10-CM | POA: Diagnosis not present

## 2015-02-11 DIAGNOSIS — R978 Other abnormal tumor markers: Secondary | ICD-10-CM | POA: Diagnosis not present

## 2015-02-11 DIAGNOSIS — G08 Intracranial and intraspinal phlebitis and thrombophlebitis: Secondary | ICD-10-CM | POA: Diagnosis not present

## 2015-02-11 DIAGNOSIS — C7800 Secondary malignant neoplasm of unspecified lung: Secondary | ICD-10-CM | POA: Diagnosis not present

## 2015-02-11 DIAGNOSIS — C259 Malignant neoplasm of pancreas, unspecified: Secondary | ICD-10-CM | POA: Diagnosis not present

## 2015-02-11 DIAGNOSIS — C3492 Malignant neoplasm of unspecified part of left bronchus or lung: Secondary | ICD-10-CM | POA: Diagnosis not present

## 2015-02-19 DIAGNOSIS — H43391 Other vitreous opacities, right eye: Secondary | ICD-10-CM | POA: Diagnosis not present

## 2015-02-19 DIAGNOSIS — Z961 Presence of intraocular lens: Secondary | ICD-10-CM | POA: Diagnosis not present

## 2015-02-24 DIAGNOSIS — Z7901 Long term (current) use of anticoagulants: Secondary | ICD-10-CM | POA: Diagnosis not present

## 2015-02-24 DIAGNOSIS — I82C21 Chronic embolism and thrombosis of right internal jugular vein: Secondary | ICD-10-CM | POA: Diagnosis not present

## 2015-02-24 DIAGNOSIS — C7800 Secondary malignant neoplasm of unspecified lung: Secondary | ICD-10-CM | POA: Diagnosis not present

## 2015-02-24 DIAGNOSIS — I714 Abdominal aortic aneurysm, without rupture: Secondary | ICD-10-CM | POA: Diagnosis not present

## 2015-02-24 DIAGNOSIS — C259 Malignant neoplasm of pancreas, unspecified: Secondary | ICD-10-CM | POA: Diagnosis not present

## 2015-02-24 DIAGNOSIS — Z95828 Presence of other vascular implants and grafts: Secondary | ICD-10-CM | POA: Diagnosis not present

## 2015-02-25 DIAGNOSIS — C7800 Secondary malignant neoplasm of unspecified lung: Secondary | ICD-10-CM | POA: Diagnosis not present

## 2015-02-25 DIAGNOSIS — C3492 Malignant neoplasm of unspecified part of left bronchus or lung: Secondary | ICD-10-CM | POA: Diagnosis not present

## 2015-02-25 DIAGNOSIS — R978 Other abnormal tumor markers: Secondary | ICD-10-CM | POA: Diagnosis not present

## 2015-02-25 DIAGNOSIS — C259 Malignant neoplasm of pancreas, unspecified: Secondary | ICD-10-CM | POA: Diagnosis not present

## 2015-02-25 DIAGNOSIS — R1013 Epigastric pain: Secondary | ICD-10-CM | POA: Diagnosis not present

## 2015-02-27 DIAGNOSIS — Z6823 Body mass index (BMI) 23.0-23.9, adult: Secondary | ICD-10-CM | POA: Diagnosis not present

## 2015-02-27 DIAGNOSIS — J0100 Acute maxillary sinusitis, unspecified: Secondary | ICD-10-CM | POA: Diagnosis not present

## 2015-02-27 DIAGNOSIS — C3490 Malignant neoplasm of unspecified part of unspecified bronchus or lung: Secondary | ICD-10-CM | POA: Diagnosis not present

## 2015-02-27 DIAGNOSIS — I10 Essential (primary) hypertension: Secondary | ICD-10-CM | POA: Diagnosis not present

## 2015-03-31 DIAGNOSIS — C259 Malignant neoplasm of pancreas, unspecified: Secondary | ICD-10-CM | POA: Diagnosis not present

## 2015-03-31 DIAGNOSIS — C7800 Secondary malignant neoplasm of unspecified lung: Secondary | ICD-10-CM | POA: Diagnosis not present

## 2015-04-03 DIAGNOSIS — Z95828 Presence of other vascular implants and grafts: Secondary | ICD-10-CM | POA: Diagnosis not present

## 2015-04-03 DIAGNOSIS — R978 Other abnormal tumor markers: Secondary | ICD-10-CM | POA: Diagnosis not present

## 2015-04-03 DIAGNOSIS — C3492 Malignant neoplasm of unspecified part of left bronchus or lung: Secondary | ICD-10-CM | POA: Diagnosis not present

## 2015-04-03 DIAGNOSIS — Z7901 Long term (current) use of anticoagulants: Secondary | ICD-10-CM | POA: Diagnosis not present

## 2015-04-03 DIAGNOSIS — Z5181 Encounter for therapeutic drug level monitoring: Secondary | ICD-10-CM | POA: Diagnosis not present

## 2015-04-03 DIAGNOSIS — C259 Malignant neoplasm of pancreas, unspecified: Secondary | ICD-10-CM | POA: Diagnosis not present

## 2015-04-03 DIAGNOSIS — C7800 Secondary malignant neoplasm of unspecified lung: Secondary | ICD-10-CM | POA: Diagnosis not present

## 2015-04-06 ENCOUNTER — Emergency Department (HOSPITAL_COMMUNITY): Payer: Medicare Other

## 2015-04-06 ENCOUNTER — Inpatient Hospital Stay (HOSPITAL_COMMUNITY)
Admission: EM | Admit: 2015-04-06 | Discharge: 2015-04-28 | DRG: 164 | Disposition: A | Discharge status: Home / Self Care | Payer: Medicare Other | Attending: Cardiothoracic Surgery | Admitting: Cardiothoracic Surgery

## 2015-04-06 ENCOUNTER — Inpatient Hospital Stay (HOSPITAL_COMMUNITY): Payer: Medicare Other

## 2015-04-06 DIAGNOSIS — Z8541 Personal history of malignant neoplasm of cervix uteri: Secondary | ICD-10-CM | POA: Diagnosis not present

## 2015-04-06 DIAGNOSIS — Z888 Allergy status to other drugs, medicaments and biological substances status: Secondary | ICD-10-CM

## 2015-04-06 DIAGNOSIS — Z79899 Other long term (current) drug therapy: Secondary | ICD-10-CM | POA: Diagnosis not present

## 2015-04-06 DIAGNOSIS — Z9842 Cataract extraction status, left eye: Secondary | ICD-10-CM

## 2015-04-06 DIAGNOSIS — Z8041 Family history of malignant neoplasm of ovary: Secondary | ICD-10-CM

## 2015-04-06 DIAGNOSIS — I719 Aortic aneurysm of unspecified site, without rupture: Secondary | ICD-10-CM | POA: Diagnosis present

## 2015-04-06 DIAGNOSIS — Z8059 Family history of malignant neoplasm of other urinary tract organ: Secondary | ICD-10-CM | POA: Diagnosis not present

## 2015-04-06 DIAGNOSIS — J95812 Postprocedural air leak: Secondary | ICD-10-CM | POA: Diagnosis not present

## 2015-04-06 DIAGNOSIS — Z800 Family history of malignant neoplasm of digestive organs: Secondary | ICD-10-CM

## 2015-04-06 DIAGNOSIS — C384 Malignant neoplasm of pleura: Secondary | ICD-10-CM | POA: Diagnosis not present

## 2015-04-06 DIAGNOSIS — C7802 Secondary malignant neoplasm of left lung: Secondary | ICD-10-CM | POA: Diagnosis present

## 2015-04-06 DIAGNOSIS — I4891 Unspecified atrial fibrillation: Secondary | ICD-10-CM | POA: Diagnosis not present

## 2015-04-06 DIAGNOSIS — R918 Other nonspecific abnormal finding of lung field: Secondary | ICD-10-CM | POA: Diagnosis not present

## 2015-04-06 DIAGNOSIS — C3490 Malignant neoplasm of unspecified part of unspecified bronchus or lung: Secondary | ICD-10-CM | POA: Diagnosis not present

## 2015-04-06 DIAGNOSIS — J9811 Atelectasis: Secondary | ICD-10-CM | POA: Diagnosis not present

## 2015-04-06 DIAGNOSIS — R112 Nausea with vomiting, unspecified: Secondary | ICD-10-CM | POA: Diagnosis not present

## 2015-04-06 DIAGNOSIS — E7800 Pure hypercholesterolemia, unspecified: Secondary | ICD-10-CM | POA: Diagnosis present

## 2015-04-06 DIAGNOSIS — J910 Malignant pleural effusion: Secondary | ICD-10-CM | POA: Diagnosis not present

## 2015-04-06 DIAGNOSIS — Z8507 Personal history of malignant neoplasm of pancreas: Secondary | ICD-10-CM

## 2015-04-06 DIAGNOSIS — Z4682 Encounter for fitting and adjustment of non-vascular catheter: Secondary | ICD-10-CM | POA: Diagnosis not present

## 2015-04-06 DIAGNOSIS — Z9841 Cataract extraction status, right eye: Secondary | ICD-10-CM | POA: Diagnosis not present

## 2015-04-06 DIAGNOSIS — R05 Cough: Secondary | ICD-10-CM | POA: Diagnosis not present

## 2015-04-06 DIAGNOSIS — J9383 Other pneumothorax: Principal | ICD-10-CM | POA: Diagnosis present

## 2015-04-06 DIAGNOSIS — Z886 Allergy status to analgesic agent status: Secondary | ICD-10-CM | POA: Diagnosis not present

## 2015-04-06 DIAGNOSIS — D649 Anemia, unspecified: Secondary | ICD-10-CM | POA: Diagnosis present

## 2015-04-06 DIAGNOSIS — K219 Gastro-esophageal reflux disease without esophagitis: Secondary | ICD-10-CM | POA: Diagnosis not present

## 2015-04-06 DIAGNOSIS — I471 Supraventricular tachycardia: Secondary | ICD-10-CM | POA: Diagnosis not present

## 2015-04-06 DIAGNOSIS — C7801 Secondary malignant neoplasm of right lung: Secondary | ICD-10-CM | POA: Diagnosis present

## 2015-04-06 DIAGNOSIS — Z9689 Presence of other specified functional implants: Secondary | ICD-10-CM

## 2015-04-06 DIAGNOSIS — J939 Pneumothorax, unspecified: Secondary | ICD-10-CM | POA: Diagnosis not present

## 2015-04-06 DIAGNOSIS — D696 Thrombocytopenia, unspecified: Secondary | ICD-10-CM | POA: Diagnosis present

## 2015-04-06 DIAGNOSIS — I10 Essential (primary) hypertension: Secondary | ICD-10-CM | POA: Diagnosis present

## 2015-04-06 DIAGNOSIS — J449 Chronic obstructive pulmonary disease, unspecified: Secondary | ICD-10-CM | POA: Diagnosis present

## 2015-04-06 DIAGNOSIS — Z923 Personal history of irradiation: Secondary | ICD-10-CM

## 2015-04-06 DIAGNOSIS — J942 Hemothorax: Secondary | ICD-10-CM | POA: Diagnosis not present

## 2015-04-06 DIAGNOSIS — M8448XA Pathological fracture, other site, initial encounter for fracture: Secondary | ICD-10-CM | POA: Diagnosis not present

## 2015-04-06 DIAGNOSIS — R079 Chest pain, unspecified: Secondary | ICD-10-CM | POA: Diagnosis not present

## 2015-04-06 DIAGNOSIS — J90 Pleural effusion, not elsewhere classified: Secondary | ICD-10-CM | POA: Diagnosis not present

## 2015-04-06 DIAGNOSIS — D6859 Other primary thrombophilia: Secondary | ICD-10-CM | POA: Diagnosis present

## 2015-04-06 DIAGNOSIS — Z90710 Acquired absence of both cervix and uterus: Secondary | ICD-10-CM | POA: Diagnosis not present

## 2015-04-06 DIAGNOSIS — Z7982 Long term (current) use of aspirin: Secondary | ICD-10-CM

## 2015-04-06 DIAGNOSIS — R0600 Dyspnea, unspecified: Secondary | ICD-10-CM

## 2015-04-06 DIAGNOSIS — Z978 Presence of other specified devices: Secondary | ICD-10-CM | POA: Diagnosis not present

## 2015-04-06 DIAGNOSIS — Z7901 Long term (current) use of anticoagulants: Secondary | ICD-10-CM | POA: Diagnosis not present

## 2015-04-06 DIAGNOSIS — J9382 Other air leak: Secondary | ICD-10-CM | POA: Diagnosis not present

## 2015-04-06 DIAGNOSIS — R0602 Shortness of breath: Secondary | ICD-10-CM | POA: Diagnosis not present

## 2015-04-06 DIAGNOSIS — J439 Emphysema, unspecified: Secondary | ICD-10-CM

## 2015-04-06 DIAGNOSIS — J9311 Primary spontaneous pneumothorax: Secondary | ICD-10-CM | POA: Diagnosis not present

## 2015-04-06 DIAGNOSIS — Z87891 Personal history of nicotine dependence: Secondary | ICD-10-CM | POA: Diagnosis not present

## 2015-04-06 DIAGNOSIS — C7800 Secondary malignant neoplasm of unspecified lung: Secondary | ICD-10-CM | POA: Diagnosis not present

## 2015-04-06 LAB — CBC WITH DIFFERENTIAL/PLATELET
BASOS ABS: 0 10*3/uL (ref 0.0–0.1)
Basophils Relative: 0 %
EOS ABS: 0.1 10*3/uL (ref 0.0–0.7)
EOS PCT: 1 %
HCT: 33.8 % — ABNORMAL LOW (ref 36.0–46.0)
Hemoglobin: 11.3 g/dL — ABNORMAL LOW (ref 12.0–15.0)
Lymphocytes Relative: 24 %
Lymphs Abs: 1.9 10*3/uL (ref 0.7–4.0)
MCH: 31.2 pg (ref 26.0–34.0)
MCHC: 33.4 g/dL (ref 30.0–36.0)
MCV: 93.4 fL (ref 78.0–100.0)
MONO ABS: 0.8 10*3/uL (ref 0.1–1.0)
Monocytes Relative: 10 %
Neutro Abs: 5.1 10*3/uL (ref 1.7–7.7)
Neutrophils Relative %: 65 %
PLATELETS: 122 10*3/uL — AB (ref 150–400)
RBC: 3.62 MIL/uL — AB (ref 3.87–5.11)
RDW: 13.4 % (ref 11.5–15.5)
WBC: 7.9 10*3/uL (ref 4.0–10.5)

## 2015-04-06 LAB — BASIC METABOLIC PANEL
ANION GAP: 12 (ref 5–15)
BUN: 10 mg/dL (ref 6–20)
CALCIUM: 9.7 mg/dL (ref 8.9–10.3)
CO2: 23 mmol/L (ref 22–32)
Chloride: 106 mmol/L (ref 101–111)
Creatinine, Ser: 1.13 mg/dL — ABNORMAL HIGH (ref 0.44–1.00)
GFR calc Af Amer: 53 mL/min — ABNORMAL LOW (ref >60)
GFR, EST NON AFRICAN AMERICAN: 46 mL/min — AB (ref >60)
GLUCOSE: 110 mg/dL — AB (ref 65–99)
POTASSIUM: 3.6 mmol/L (ref 3.5–5.1)
SODIUM: 141 mmol/L (ref 135–145)

## 2015-04-06 LAB — PROTIME-INR
INR: 1.11 (ref 0.00–1.49)
PROTHROMBIN TIME: 14.5 s (ref 11.6–15.2)

## 2015-04-06 MED ORDER — ACETAMINOPHEN 325 MG PO TABS
650.0000 mg | ORAL_TABLET | Freq: Four times a day (QID) | ORAL | Status: DC | PRN
  Administered 2015-04-09 – 2015-04-18 (×9): 650 mg via ORAL
  Filled 2015-04-06 (×9): qty 2

## 2015-04-06 MED ORDER — ONDANSETRON HCL 4 MG PO TABS
4.0000 mg | ORAL_TABLET | Freq: Four times a day (QID) | ORAL | Status: DC | PRN
  Administered 2015-04-12: 4 mg via ORAL
  Filled 2015-04-06 (×2): qty 1

## 2015-04-06 MED ORDER — METOPROLOL TARTRATE 25 MG PO TABS
25.0000 mg | ORAL_TABLET | Freq: Every morning | ORAL | Status: DC
  Administered 2015-04-07 – 2015-04-28 (×19): 25 mg via ORAL
  Filled 2015-04-06 (×21): qty 1

## 2015-04-06 MED ORDER — ASPIRIN EC 81 MG PO TBEC
81.0000 mg | DELAYED_RELEASE_TABLET | Freq: Every day | ORAL | Status: DC
Start: 2015-04-07 — End: 2015-04-09
  Filled 2015-04-06 (×2): qty 1

## 2015-04-06 MED ORDER — ALBUTEROL SULFATE (2.5 MG/3ML) 0.083% IN NEBU: 2.5000 mg | INHALATION_SOLUTION | RESPIRATORY_TRACT | Status: DC | PRN

## 2015-04-06 MED ORDER — HYDROMORPHONE HCL 1 MG/ML IJ SOLN: 1.0000 mg | INTRAMUSCULAR | Status: DC | PRN

## 2015-04-06 MED ORDER — ACETAMINOPHEN 650 MG RE SUPP: 650.0000 mg | Freq: Four times a day (QID) | RECTAL | Status: DC | PRN

## 2015-04-06 MED ORDER — ENOXAPARIN SODIUM 40 MG/0.4ML ~~LOC~~ SOLN: 40.0000 mg | SUBCUTANEOUS | Status: DC

## 2015-04-06 MED ORDER — DOCUSATE SODIUM 100 MG PO CAPS
100.0000 mg | ORAL_CAPSULE | Freq: Two times a day (BID) | ORAL | Status: DC
  Administered 2015-04-07 – 2015-04-16 (×12): 100 mg via ORAL
  Filled 2015-04-06 (×23): qty 1

## 2015-04-06 MED ORDER — DEXTROSE-NACL 5-0.45 % IV SOLN
INTRAVENOUS | Status: DC
  Administered 2015-04-06 – 2015-04-19 (×5): via INTRAVENOUS

## 2015-04-06 MED ORDER — ONDANSETRON HCL 4 MG/2ML IJ SOLN
4.0000 mg | Freq: Four times a day (QID) | INTRAMUSCULAR | Status: DC | PRN
  Administered 2015-04-15: 4 mg via INTRAVENOUS
  Filled 2015-04-06: qty 2

## 2015-04-06 MED ORDER — GUAIFENESIN ER 600 MG PO TB12
600.0000 mg | ORAL_TABLET | Freq: Two times a day (BID) | ORAL | Status: DC
  Administered 2015-04-07 – 2015-04-19 (×24): 600 mg via ORAL
  Filled 2015-04-06 (×26): qty 1

## 2015-04-06 MED ORDER — ALUM & MAG HYDROXIDE-SIMETH 200-200-20 MG/5ML PO SUSP
30.0000 mL | Freq: Four times a day (QID) | ORAL | Status: DC | PRN
  Administered 2015-04-07 – 2015-04-20 (×3): 30 mL via ORAL
  Filled 2015-04-06 (×3): qty 30

## 2015-04-06 MED ORDER — PANTOPRAZOLE SODIUM 40 MG PO TBEC
40.0000 mg | DELAYED_RELEASE_TABLET | Freq: Every day | ORAL | Status: DC
  Administered 2015-04-07: 40 mg via ORAL
  Filled 2015-04-06: qty 1

## 2015-04-06 MED ORDER — HYDROCODONE-ACETAMINOPHEN 5-325 MG PO TABS
1.0000 | ORAL_TABLET | ORAL | Status: DC | PRN
  Administered 2015-04-07: 1 via ORAL
  Administered 2015-04-07 – 2015-04-12 (×2): 2 via ORAL
  Filled 2015-04-06: qty 1
  Filled 2015-04-06: qty 2
  Filled 2015-04-06: qty 1
  Filled 2015-04-06: qty 2

## 2015-04-06 MED ORDER — HYDROCHLOROTHIAZIDE 12.5 MG PO CAPS
12.5000 mg | ORAL_CAPSULE | Freq: Every day | ORAL | Status: DC
  Administered 2015-04-07 – 2015-04-19 (×13): 12.5 mg via ORAL
  Filled 2015-04-06 (×14): qty 1

## 2015-04-06 MED ORDER — FENTANYL CITRATE (PF) 100 MCG/2ML IJ SOLN
50.0000 ug | Freq: Once | INTRAMUSCULAR | Status: AC
  Administered 2015-04-06: 50 ug via INTRAVENOUS
  Filled 2015-04-06: qty 2

## 2015-04-06 MED ORDER — ENOXAPARIN SODIUM 40 MG/0.4ML ~~LOC~~ SOLN
40.0000 mg | SUBCUTANEOUS | Status: DC
  Administered 2015-04-07 – 2015-04-08 (×2): 40 mg via SUBCUTANEOUS
  Filled 2015-04-06 (×2): qty 0.4

## 2015-04-06 MED ORDER — LIDOCAINE-EPINEPHRINE (PF) 2 %-1:200000 IJ SOLN
20.0000 mL | Freq: Once | INTRAMUSCULAR | Status: AC
  Administered 2015-04-06: 20 mL
  Filled 2015-04-06: qty 20

## 2015-04-06 MED ORDER — MIDAZOLAM HCL 2 MG/2ML IJ SOLN
2.0000 mg | Freq: Once | INTRAMUSCULAR | Status: AC
  Administered 2015-04-06: 2 mg via INTRAVENOUS
  Filled 2015-04-06: qty 2

## 2015-04-06 MED ORDER — LOSARTAN POTASSIUM 50 MG PO TABS
100.0000 mg | ORAL_TABLET | Freq: Every day | ORAL | Status: DC
  Administered 2015-04-07 – 2015-04-14 (×8): 100 mg via ORAL
  Filled 2015-04-06 (×9): qty 2

## 2015-04-06 MED ORDER — SORBITOL 70 % SOLN
30.0000 mL | Freq: Every day | Status: DC | PRN
  Filled 2015-04-06: qty 30

## 2015-04-06 MED ORDER — MAGNESIUM HYDROXIDE 400 MG/5ML PO SUSP: 30.0000 mL | Freq: Every day | ORAL | Status: DC | PRN

## 2015-04-06 MED ORDER — VITAMIN D 1000 UNITS PO TABS
1000.0000 [IU] | ORAL_TABLET | Freq: Every day | ORAL | Status: DC
  Administered 2015-04-07 – 2015-04-28 (×21): 1000 [IU] via ORAL
  Filled 2015-04-06 (×21): qty 1

## 2015-04-06 MED ORDER — PANCRELIPASE (LIP-PROT-AMYL) 12000-38000 UNITS PO CPEP
36000.0000 [IU] | ORAL_CAPSULE | Freq: Every day | ORAL | Status: DC
  Administered 2015-04-07 – 2015-04-28 (×19): 36000 [IU] via ORAL
  Filled 2015-04-06 (×22): qty 3

## 2015-04-06 NOTE — ED Notes (Signed)
Pt coming from [facility] with c/o CP, and shortness of breath. Pt had CTA done today, found pneumothorax on the left side. Pt needs chest tube placed. Pt on Lovenox for 4 months. Dr. Lawson Fiscal made aware. Dr. Tomi Bamberger accepting physician. VSS, Pt A&Ox4, respirations equal and unlabored, skin warm and dry

## 2015-04-06 NOTE — Op Note (Signed)
Procedure-placement of left 20 French chest tube for spontaneous pneumothorax  Surgeon-Levonne Carreras Prescott Gum M.D.  Anesthesia-local 1% lidocaine with IV conscious monitored sedation  Pre-and postoperative diagnosis 60% spontaneous left pneumothorax, history of metastatic pancreatic cancer to the lung  Procedure After informed consent and a proper timeout the left chest was prepped and draped as a sterile field. One percent lidocaine was infiltrated in the anterior axillary line at the fifth interspace. A small incision was made. Lidocaine was infiltrated further to the intercostal space. Using a small hemostat the left pleural space was entered and immediately air under pressure exited. A 20 French chest tube was positioned and directed to the apex, connected to a Pleur-evac underwater seal system, and secured to the skin with a silk suture. Sterile dressing was applied. Portable chest x-ray is pending.

## 2015-04-06 NOTE — ED Provider Notes (Signed)
CSN: [id number]     Arrival date & time 04/06/15  2122 History   First MD Initiated Contact with Patient 04/06/15 2129     Chief Complaint  Patient presents with  . Chest Pain  . Shortness of Breath     (Consider location/radiation/quality/duration/timing/severity/associated sxs/prior Treatment) HPI Comments: Patient transferred from outside hospital with hydropneumothorax. Patient with shortness of breath that onset this morning. She has a history of pancreatic cancer status post resection with metastases to her lungs. He saw her PCP today for shortness of breath that onset this morning. X-ray showed a left-sided pneumothorax. She then had a CT angiogram of her chest which showed hydropneumothorax with gas component. She was transferred for chest tube placement since she is anticoagulated and is known to the thoracic surgeon here. She does not wear oxygen at home but is comfortable on 2 L. She denies any chest pain, abdominal pain, nausea or vomiting. No fever or cough. No leg pain or leg swelling.  Patient is a 75 y.o. female presenting with shortness of breath. The history is provided by the patient and the EMS personnel.  Shortness of Breath Associated symptoms: no abdominal pain, no chest pain, no fever and no headaches     Past Medical History  Diagnosis Date  . Cancer [facility]) July 2010    Pancreatic adenocarcinoma  . Cervical cancer ([place]) 1963  . Emphysema   . Hypertension   . Hypercholesterolemia   . COPD (chronic obstructive pulmonary disease) ([place])   . Abdominal wall hernia   . Aortic aneurysm ([facility])     small aortic aneurysm  . Cataract 2010    b/l surgery   Past Surgical History  Procedure Laterality Date  . Vaginal hysterectomy  1963    Cervical Cancer  . Tonsillectomy  1943  . Whipple procedure  July 2010    pancreatic adenocarcinoma  . Cholecystectomy  July 2010    done w/ Whipple procedure  . Breast surgery  1988    Right breast abcess  . Bunionectomy  2007   . Eye surgery  2009    Bilateral cataract   Family History  Problem Relation Age of Onset  . Pancreatic cancer Brother   . Colon cancer Sister 69    12/2006 dx surgery and chemotherapy  . Ovarian cancer Sister 25    Ovarian ca,  surgery and chemotherapy   Social History  Substance Use Topics  . Smoking status: Former Smoker -- 1.00 packs/day for 30 years    Types: Cigarettes    Quit date: 02/29/1996  . Smokeless tobacco: Never Used  . Alcohol Use: No   OB History    No data available     Review of Systems  Constitutional: Negative for fever, appetite change and fatigue.  HENT: Negative for congestion.   Eyes: Negative for visual disturbance.  Respiratory: Positive for shortness of breath.   Cardiovascular: Negative for chest pain and leg swelling.  Gastrointestinal: Negative for nausea and abdominal pain.  Genitourinary: Negative for dysuria, hematuria, vaginal bleeding and vaginal discharge.  Musculoskeletal: Negative for myalgias and arthralgias.  Neurological: Negative for dizziness, light-headedness and headaches.  A complete 10 system review of systems was obtained and all systems are negative except as noted in the HPI and PMH.      Allergies  Oxaliplatin and Aspirin  Home Medications   Prior to Admission medications   Medication Sig Start Date End Date Taking? Authorizing Provider  acetaminophen (TYLENOL) 325 MG tablet Take 650  mg by mouth every 6 (six) hours as needed for moderate pain.    Yes Historical Provider, MD  enoxaparin (LOVENOX) 100 MG/ML injection Inject 100 mg into the skin daily. 02/26/15 05/27/15 Yes Historical Provider, MD  hydrochlorothiazide (HYDRODIURIL) 25 MG tablet Take 12.5 mg by mouth daily.    Yes Historical Provider, MD  lipase/protease/amylase (CREON) 36000 UNITS CPEP capsule Take 36,000 Units by mouth 3 (three) times daily with meals.   Yes Historical Provider, MD  losartan (COZAAR) 100 MG tablet Take 100 mg by mouth daily before  breakfast.    Yes Historical Provider, MD  metoprolol succinate (TOPROL-XL) 50 MG 24 hr tablet Take 25 mg by mouth daily before breakfast. Take with or immediately following a meal.   Yes Historical Provider, MD  omeprazole (PRILOSEC) 20 MG capsule Take 20 mg by mouth daily.    Yes Historical Provider, MD  amoxicillin-clavulanate (AUGMENTIN) 875-125 MG tablet Take 1 tablet by mouth 2 (two) times daily. 02/27/15   Historical Provider, MD   BP 147/55 mmHg  Pulse 61  Temp(Src) 98.1 F (36.7 C) (Oral)  Resp 18  Ht '5\' 4"'  (1.626 m)  Wt 134 lb 9.6 oz (61.054 kg)  BMI 23.09 kg/m2  SpO2 99% Physical Exam  Constitutional: She is oriented to person, place, and time. She appears well-developed and well-nourished. No distress.  HENT:  Head: Normocephalic and atraumatic.  Mouth/Throat: Oropharynx is clear and moist. No oropharyngeal exudate.  Eyes: Conjunctivae and EOM are normal. Pupils are equal, round, and reactive to light.  Neck: Normal range of motion. Neck supple.  No meningismus.  Cardiovascular: Normal rate, regular rhythm, normal heart sounds and intact distal pulses.   No murmur heard. Pulmonary/Chest: Effort normal. She exhibits no tenderness.  Decreased breath sounds on left  Abdominal: Soft. There is no tenderness. There is no rebound and no guarding.  Musculoskeletal: Normal range of motion. She exhibits no edema or tenderness.  Neurological: She is alert and oriented to person, place, and time. No cranial nerve deficit. She exhibits normal muscle tone. Coordination normal.  No ataxia on finger to nose bilaterally. No pronator drift. 5/5 strength throughout. CN 2-12 intact.Equal grip strength. Sensation intact.   Skin: Skin is warm.  Psychiatric: She has a normal mood and affect. Her behavior is normal.  Nursing note and vitals reviewed.   ED Course  Procedures (including critical care time) Labs Review Labs Reviewed  CBC WITH DIFFERENTIAL/PLATELET - Abnormal; Notable for the  following:    RBC 3.62 (*)    Hemoglobin 11.3 (*)    HCT 33.8 (*)    Platelets 122 (*)    All other components within normal limits  BASIC METABOLIC PANEL - Abnormal; Notable for the following:    Glucose, Bld 110 (*)    Creatinine, Ser 1.13 (*)    GFR calc non Af Amer 46 (*)    GFR calc Af Amer 53 (*)    All other components within normal limits  BASIC METABOLIC PANEL - Abnormal; Notable for the following:    Glucose, Bld 131 (*)    Creatinine, Ser 1.15 (*)    GFR calc non Af Amer 45 (*)    GFR calc Af Amer 52 (*)    All other components within normal limits  CBC - Abnormal; Notable for the following:    RBC 3.64 (*)    Hemoglobin 10.7 (*)    HCT 33.7 (*)    Platelets 101 (*)    All other components  within normal limits  PROTIME-INR    Imaging Review Portable Chest 1 View  04/07/2015  CLINICAL DATA:  Chest tubes in place. EXAM: PORTABLE CHEST 1 VIEW COMPARISON:  April 06, 2015. FINDINGS: Stable cardiomediastinal silhouette. Right internal jugular Port-A-Cath is unchanged in position. Left-sided chest tube is unchanged in position. Minimal left apical pneumothorax is noted. Postsurgical changes are noted in the right upper lobe. Stable left basilar opacity is noted concerning for atelectasis or infiltrate with a minimal associated pleural effusion. Atherosclerosis of thoracic aorta is noted. IMPRESSION: Left-sided chest tube is noted with no change seen involving minimal left apical pneumothorax. Stable left basilar opacity is noted concerning for atelectasis or infiltrate with minimal associated pleural effusion. Electronically Signed   By: Marijo Conception, M.D.   On: 04/07/2015 08:19   Dg Chest Port 1 View  04/06/2015  CLINICAL DATA:  75 year old female status post chest tube placement EXAM: PORTABLE CHEST 1 VIEW COMPARISON:  Prior chest x-ray obtained earlier today at 17:42 p.m. FINDINGS: Interval placement of a left-sided chest tube with near-total interval resolution of the  left pneumothorax. There is a tiny residual left apical pneumothorax. Stable cardiac and mediastinal contours. Aortic atherosclerosis. Right IJ approach single-lumen power injectable port catheter. Catheter tip overlies the mid SVC. Cavitary pulmonary nodule in the lingula better demonstrated on recent CT imaging. Background of emphysema with scattered bronchitic change and pleural parenchymal scarring. No acute osseous abnormality. IMPRESSION: 1. Near-total interval resolution of pneumothorax following chest tube placement. 2. Tiny residual left apical pneumothorax. Electronically Signed   By: Jacqulynn Cadet M.D.   On: 04/06/2015 23:24   I have personally reviewed and evaluated these images and lab results as part of my medical decision-making.   EKG Interpretation None      MDM   Final diagnoses:  Chest tube in place  Pneumothorax   Transfer from OSH with pneumothorax for thoracic surgery evaluation, previous VATS.  She is breathing well on 2L, no distress. Denies chest pain. Decreased breath sounds on L.  Dr. Prescott Gum met patient on arrival and inserted L sided chest tube without complication.  She will be admitted to thoracic surgery service for chest tube management and observation.    Ezequiel Essex, MD 04/07/15 [phone]

## 2015-04-06 NOTE — Consult Note (Addendum)
[address]       [place] [address]             [phone]        [name] [name] Medical Record #[id number] Date of Birth: October 05, 1940   Referring: Dr. Manuella Ghazi , Dr. Woodroe Chen Primary Care: Monico Blitz, MD  Chief Complaint:    Chief Complaint  Patient presents with  . Chest Pain  . Shortness of Breath   patient examined, most recent CT scan of chest and chest x-ray personally reviewed  History of Present Illness:     Very nice 75 year old Caucasian female reformed smoker referred from outside hospital with a 50-70 percent left spontaneous pneumothorax. No previous history of pneumothorax. No recent history of trauma or vigorous coughing. The patient has a history of metastatic pancreatic cancer with several pulmonary metastases bilaterally on her last PET scan performed last year. She is status post right VATS for resection of pulmonary metastases 6 years ago by Dr. Arlyce Dice and status post left VATS for resection of pulmonary metastases 5 years ago by Dr. Arlyce Dice. 3 years ago the patient had stereotactic body radiation therapy to a left upper lobe metastatic nodule. The patient has not had any chemotherapy for over 6 months.  The patient has a known hypercoagulable state and has had intracerebral thrombosis for which she is on Lovenox 100 mg once daily.  The patient was transferred to this facility for thoracic surgical evaluation and therapy of a large spontaneous pneumothorax. She is symptomatic with shortness of breath on any activity. The symptoms started when she woke up this morning.   Current Activity/ Functional Status: Despite having metastatic pancreatic cancer the patient's functional status is excellent. She lives alone and denies difficulty with her her ADLs.   Zubrod Score: At the time of surgery this patient's most appropriate activity status/level should be described as: '[]'$     0    Normal activity, no symptoms '[]'$     1    Restricted in  physical strenuous activity but ambulatory, able to do out light work '[x]'$     2    Ambulatory and capable of self care, unable to do work activities, up and about                 more than 50%  Of the time                            '[]'$     3    Only limited self care, in bed greater than 50% of waking hours '[]'$     4    Completely disabled, no self care, confined to bed or chair '[]'$     5    Moribund  Past Medical History  Diagnosis Date  . Cancer July 2010    Pancreatic adenocarcinoma  . Cervical cancer 1963  . Emphysema   . Hypertension   . Hypercholesterolemia   . COPD (chronic obstructive pulmonary disease)   . Abdominal wall hernia   . Aortic aneurysm     small aortic aneurysm  . Cataract 2010    b/l surgery    Past Surgical History  Procedure Laterality Date  . Vaginal hysterectomy  1963    Cervical Cancer  . Tonsillectomy  1943  . Whipple procedure  July 2010    pancreatic adenocarcinoma  . Cholecystectomy  July 2010    done w/ Whipple procedure  .  Breast surgery  1988    Right breast abcess  . Bunionectomy  2007  . Eye surgery  2009    Bilateral cataract    History  Smoking status  . Former Smoker -- 1.00 packs/day for 30 years  . Types: Cigarettes  . Quit date: 02/29/1996  Smokeless tobacco  . Never Used   History  Alcohol Use No    Social History   Social History  . Marital Status: Widowed    Spouse Name: N/A  . Number of Children: 0  . Years of Education: N/A   Occupational History  .     Social History Main Topics  . Smoking status: Former Smoker -- 1.00 packs/day for 30 years    Types: Cigarettes    Quit date: 02/29/1996  . Smokeless tobacco: Never Used  . Alcohol Use: No  . Drug Use: No  . Sexual Activity: No   Other Topics Concern  . Not on file   Social History Narrative  . No narrative on file    Allergies  Allergen Reactions  . Oxaliplatin Anaphylaxis  . Aspirin Other (See Comments)    Burns stomach    Current  Facility-Administered Medications  Medication Dose Route Frequency Provider Last Rate Last Dose  . acetaminophen (TYLENOL) tablet 650 mg  650 mg Oral Q6H PRN Ivin Poot, MD       Or  . acetaminophen (TYLENOL) suppository 650 mg  650 mg Rectal Q6H PRN Ivin Poot, MD      . albuterol (PROVENTIL) (2.5 MG/3ML) 0.083% nebulizer solution 2.5 mg  2.5 mg Nebulization Q2H PRN Ivin Poot, MD      . alum & mag hydroxide-simeth (MAALOX/MYLANTA) 200-200-20 MG/5ML suspension 30 mL  30 mL Oral Q6H PRN Ivin Poot, MD      . Derrill Memo ON 04/07/2015] aspirin EC tablet 81 mg  81 mg Oral Daily Ivin Poot, MD      . Derrill Memo ON 04/07/2015] cholecalciferol (VITAMIN D) tablet 1,000 Units  1,000 Units Oral Daily Ivin Poot, MD      . dextrose 5 %-0.45 % sodium chloride infusion   Intravenous Continuous Ivin Poot, MD      . docusate sodium (COLACE) capsule 100 mg  100 mg Oral BID Ivin Poot, MD      . Derrill Memo ON 04/07/2015] enoxaparin (LOVENOX) injection 40 mg  40 mg Subcutaneous Q24H Ivin Poot, MD      . guaiFENesin [facility]) 12 hr tablet 600 mg  600 mg Oral BID Ivin Poot, MD      . Derrill Memo ON 04/07/2015] hydrochlorothiazide (MICROZIDE) capsule 12.5 mg  12.5 mg Oral Daily Ivin Poot, MD      . HYDROcodone-acetaminophen (NORCO/VICODIN) 5-325 MG per tablet 1-2 tablet  1-2 tablet Oral Q4H PRN Ivin Poot, MD      . HYDROmorphone (DILAUDID) injection 1 mg  1 mg Intravenous Q3H PRN Ivin Poot, MD      . Derrill Memo ON 04/07/2015] lipase/protease/amylase (CREON) capsule 36,000 Units  36,000 Units Oral q morning - 10a Ivin Poot, MD      . Derrill Memo ON 04/07/2015] losartan (COZAAR) tablet 100 mg  100 mg Oral Daily Ivin Poot, MD      . magnesium hydroxide (MILK OF MAGNESIA) suspension 30 mL  30 mL Oral Daily PRN Ivin Poot, MD      . Derrill Memo ON 04/07/2015] metoprolol tartrate (LOPRESSOR) tablet 25 mg  25  mg Oral q morning - 10a Ivin Poot, MD      . ondansetron  [facility]) tablet 4 mg  4 mg Oral Q6H PRN Ivin Poot, MD       Or  . ondansetron [facility]) injection 4 mg  4 mg Intravenous Q6H PRN Ivin Poot, MD      . Derrill Memo ON 04/07/2015] pantoprazole (PROTONIX) EC tablet 40 mg  40 mg Oral Daily Ivin Poot, MD      . sorbitol 70 % solution 30 mL  30 mL Oral Daily PRN Ivin Poot, MD       Current Outpatient Prescriptions  Medication Sig Dispense Refill  . acetaminophen (TYLENOL) 325 MG tablet Take 650 mg by mouth every 6 (six) hours as needed.    . hydrochlorothiazide (HYDRODIURIL) 25 MG tablet Take 12.5 mg by mouth daily.     Marland Kitchen losartan (COZAAR) 100 MG tablet Take 100 mg by mouth daily before breakfast.     . metoprolol succinate (TOPROL-XL) 50 MG 24 hr tablet Take 25 mg by mouth daily before breakfast. Take with or immediately following a meal.    . omeprazole (PRILOSEC) 20 MG capsule Take 20 mg by mouth daily.     Marland Kitchen aspirin 81 MG tablet Take 81 mg by mouth daily before breakfast.     . naproxen sodium (ANAPROX) 220 MG tablet Take 220 mg by mouth 2 (two) times daily as needed. For pain.    . simvastatin (ZOCOR) 20 MG tablet Take 20 mg by mouth every evening.       (Not in a hospital admission)  Family History  Problem Relation Age of Onset  . Pancreatic cancer Brother   . Colon cancer Sister 68    12/2006 dx surgery and chemotherapy  . Ovarian cancer Sister 77    Ovarian ca,  surgery and chemotherapy     Review of Systems:       Cardiac Review of Systems: Y or N  Chest Pain [  yes left side  ]  Resting SOB [   no] Exertional SOB  [place]  ]  Orthopnea [no  ]   Pedal Edema [ no  ]    Palpitations [no  ] Syncope  [ no ]   Presyncope [no   ]  General Review of Systems: [Y] = yes [  ]=no Constitional: recent weight change [  ]; anorexia [  ]; fatigue [  ]; nausea [  ]; night sweats [  ]; fever [  ]; or chills [  ]                                                               Dental: poor dentition[  ]; Last Dentist visit: Every 6  months  Eye : blurred vision [  ]; diplopia [   ]; vision changes [  ];  Amaurosis fugax[  ]; Resp: cough [  ];  wheezing[  ];  hemoptysis[  ]; shortness of breath[yes  ]; paroxysmal nocturnal dyspnea[  ]; dyspnea on exertion[yes  ]; or orthopnea[  ];  GI:  gallstones[  ], vomiting[  ];  dysphagia[  ]; melena[  ];  hematochezia [  ]; heartburn[  ];   Hx of  Colonoscopy[  ];  GU: kidney stones [  ]; hematuria[  ];   dysuria [  ];  nocturia[  ];  history of     obstruction [  ]; urinary frequency [  ]             Skin: rash, swelling[  ];, hair loss[  ];  peripheral edema[  ];  or itching[  ]; Musculosketetal: myalgias[  ];  joint swelling[  ];  joint erythema[  ];  joint pain[  ];  back pain[  ];  Heme/Lymph: bruising[  ];  bleeding[  ];  anemia[  ];  Neuro: TIA[  ];  headaches[ occasional ];  stroke[  ];  vertigo[  ];  seizures[  ];   paresthesias[  ];  difficulty walking[  ];  Psych:depression[  ]; anxiety[  ];  Endocrine: diabetes[  ];  thyroid dysfunction[  ];  Immunizations: Flu [  ]; Pneumococcal[  ];  Other:  Physical Exam:   Heart rate 80 Blood pressure 155/95 Oxygen saturation 98% on 2 L nasal cannula      General: Very pleasant and attractive 88 -year-old Caucasian female slightly anxious HEENT: Normocephalic pupils equal , dentition adequate Neck: Supple without JVD, adenopathy, or bruit. No crepitus. Chest: Diminished breath sounds on the left, no rhonchi, no tenderness             or deformity. Surgical scars on both sides of thorax. Cardiovascular: Regular rate and rhythm, no murmur, no gallop, peripheral pulses             palpable in all extremities Abdomen:  Soft, nontender, no palpable mass or organomegaly Extremities: Warm, well-perfused, no clubbing cyanosis, mild pedal edema  without  tenderness, mild venous stasis changes of the legs             Rectal/GU: Deferred Neuro: Grossly non--focal and symmetrical throughout Skin: Clean and dry without rash or  ulceration     Diagnostic Studies & Laboratory data:     Recent Radiology Findings:   Chest x-ray shows left 60% pneumothorax  I have independently reviewed the above radiologic studies.  Recent Lab Findings: Lab Results  Component Value Date   WBC 7.9 04/06/2015   HGB 11.3* 04/06/2015   HCT 33.8* 04/06/2015   PLT 122* 04/06/2015   GLUCOSE 110* 04/06/2015   ALT 14 06/05/2010   AST 24 06/05/2010   NA 141 04/06/2015   K 3.6 04/06/2015   CL 106 04/06/2015   CREATININE 1.13* 04/06/2015   BUN 10 04/06/2015   CO2 23 04/06/2015   INR 1.11 04/06/2015      Assessment / Plan:     Left spontaneous pneumothorax, symptomatic Plan placement of left chest tube and admission for chest tube drainage. Plan for chest tube insertion and plan for admission discussed with patient and family and they understand and agree.      '@ME1'$ @ 04/06/2015 10:48 PM

## 2015-04-07 ENCOUNTER — Inpatient Hospital Stay (HOSPITAL_COMMUNITY): Payer: Medicare Other

## 2015-04-07 ENCOUNTER — Encounter (HOSPITAL_COMMUNITY): Payer: Self-pay | Admitting: *Deleted

## 2015-04-07 LAB — CBC
HCT: 33.7 % — ABNORMAL LOW (ref 36.0–46.0)
Hemoglobin: 10.7 g/dL — ABNORMAL LOW (ref 12.0–15.0)
MCH: 29.4 pg (ref 26.0–34.0)
MCHC: 31.8 g/dL (ref 30.0–36.0)
MCV: 92.6 fL (ref 78.0–100.0)
Platelets: 101 10*3/uL — ABNORMAL LOW (ref 150–400)
RBC: 3.64 MIL/uL — ABNORMAL LOW (ref 3.87–5.11)
RDW: 13.5 % (ref 11.5–15.5)
WBC: 7.2 10*3/uL (ref 4.0–10.5)

## 2015-04-07 LAB — BASIC METABOLIC PANEL
Anion gap: 11 (ref 5–15)
BUN: 11 mg/dL (ref 6–20)
CO2: 23 mmol/L (ref 22–32)
Calcium: 9.3 mg/dL (ref 8.9–10.3)
Chloride: 105 mmol/L (ref 101–111)
Creatinine, Ser: 1.15 mg/dL — ABNORMAL HIGH (ref 0.44–1.00)
GFR calc Af Amer: 52 mL/min — ABNORMAL LOW (ref >60)
GFR calc non Af Amer: 45 mL/min — ABNORMAL LOW (ref >60)
Glucose, Bld: 131 mg/dL — ABNORMAL HIGH (ref 65–99)
Potassium: 4 mmol/L (ref 3.5–5.1)
Sodium: 139 mmol/L (ref 135–145)

## 2015-04-07 MED ORDER — FENTANYL CITRATE (PF) 100 MCG/2ML IJ SOLN
25.0000 ug | INTRAMUSCULAR | Status: DC | PRN
Start: 2015-04-07 — End: 2015-04-20
  Administered 2015-04-12 (×2): 25 ug via INTRAVENOUS
  Filled 2015-04-07 (×2): qty 2

## 2015-04-07 MED ORDER — ENSURE ENLIVE PO LIQD
237.0000 mL | Freq: Two times a day (BID) | ORAL | Status: DC
  Administered 2015-04-07 – 2015-04-28 (×26): 237 mL via ORAL

## 2015-04-07 MED ORDER — BOOST / RESOURCE BREEZE PO LIQD
237.0000 mL | Freq: Three times a day (TID) | ORAL | Status: DC
  Administered 2015-04-07 – 2015-04-08 (×2): 1 via ORAL
  Administered 2015-04-08: 0.9875 via ORAL
  Administered 2015-04-09: 1 via ORAL
  Administered 2015-04-09: 237 mL via ORAL
  Administered 2015-04-09 – 2015-04-28 (×33): 1 via ORAL

## 2015-04-07 NOTE — Progress Notes (Signed)
Utilization review completed. Rabon Scholle, RN, BSN. 

## 2015-04-07 NOTE — Progress Notes (Addendum)
      [address]       [place] [address]             [phone]            Subjective: Patient with nausea and vomited water after taking pain pill on an empty stomach. Feels better after some crackers.  Objective: Vital signs in last 24 hours: Temp:  [98.1 F (36.7 C)-98.5 F (36.9 C)] 98.1 F (36.7 C) (02/14 0610) Pulse Rate:  [61-63] 61 (02/14 0610) Cardiac Rhythm:  [-] Normal sinus rhythm (02/14 0100) Resp:  [18-20] 18 (02/14 0610) BP: (154-164)/(67-68) 154/68 mmHg (02/14 0610) SpO2:  [99 %-100 %] 99 % (02/14 0610) FiO2 (%):  [0 %] 0 % (02/13 2205) Weight:  [134 lb 9.6 oz (61.054 kg)] 134 lb 9.6 oz (61.054 kg) (02/14 0500)       Intake/Output from previous day: 02/13 0701 - 02/14 0700 In: -  Out: 400 [Urine:400]   Physical Exam:  Cardiovascular: RRR Pulmonary: Coarse on left Abdomen: Soft, non tender, bowel sounds present. Chest Tube: to suction, + 2 air leak  Lab Results: CBC: Recent Labs  04/06/15 2136 04/07/15 0514  WBC 7.9 7.2  HGB 11.3* 10.7*  HCT 33.8* 33.7*  PLT 122* 101*   BMET:  Recent Labs  04/06/15 2136 04/07/15 0514  NA 141 139  K 3.6 4.0  CL 106 105  CO2 23 23  GLUCOSE 110* 131*  BUN 10 11  CREATININE 1.13* 1.15*  CALCIUM 9.7 9.3    PT/INR:  Recent Labs  04/06/15 2136  LABPROT 14.5  INR 1.11   ABG:  INR: Will add last result for INR, ABG once components are confirmed Will add last 4 CBG results once components are confirmed  Assessment/Plan:  1. CV - SR in the 60's, On Cozaar 100 mg daily and Lopressor 25 mg daily (with parameters). 2.  Pulmonary - Chest tube with scant output. Chest tube is to suction. There is a +2 air leak. CXR this am appears stable. Chest tube to suction for now. 3. Anemia-H and H 10.7 and 33.7 4. Thrombocytopenia-platelets 101,000  ZIMMERMAN,DONIELLE MPA-C 04/07/2015,7:49 AM  patient examined and medical record reviewed,agree with above note. Will probably instill  talc into chest tube when air leak resolves before pulling tube Tharon Aquas Trigt III 04/07/2015

## 2015-04-07 NOTE — Progress Notes (Signed)
Patient requesting medication for indigestion, given Maalox as ordered as needed. Will continue to monitor patient.Shine Mikes, Bettina Gavia RN

## 2015-04-08 ENCOUNTER — Inpatient Hospital Stay (HOSPITAL_COMMUNITY): Payer: Medicare Other

## 2015-04-08 MED ORDER — ENOXAPARIN SODIUM 100 MG/ML ~~LOC~~ SOLN
90.0000 mg | SUBCUTANEOUS | Status: AC
  Administered 2015-04-09 – 2015-04-19 (×11): 90 mg via SUBCUTANEOUS
  Filled 2015-04-08 (×10): qty 1

## 2015-04-08 MED ORDER — NON FORMULARY: 20.0000 mg | Freq: Every morning | Status: DC

## 2015-04-08 MED ORDER — OMEPRAZOLE 20 MG PO CPDR
20.0000 mg | DELAYED_RELEASE_CAPSULE | Freq: Every day | ORAL | Status: DC
  Administered 2015-04-08 – 2015-04-19 (×12): 20 mg via ORAL
  Filled 2015-04-08 (×15): qty 1

## 2015-04-08 NOTE — Progress Notes (Addendum)
      [address]       [place] [address]             [phone]            Subjective: Patient requesting she be able to take Omeprazole as Protonix (hospital substitute)  does not work.  Objective: Vital signs in last 24 hours: Temp:  [98.2 F (36.8 C)-98.3 F (36.8 C)] 98.2 F (36.8 C) (02/15 0527) Pulse Rate:  [59-70] 62 (02/15 0527) Cardiac Rhythm:  [-] Other (Comment) (02/15 0700) Resp:  [18] 18 (02/15 0527) BP: (132-147)/(55-68) 147/68 mmHg (02/15 0527) SpO2:  [98 %-100 %] 99 % (02/15 0527) Weight:  [135 lb 9.3 oz (61.5 kg)] 135 lb 9.3 oz (61.5 kg) (02/15 0527)      Intake/Output from previous day: 02/14 0701 - 02/15 0700 In: 968.3 [P.O.:240; I.V.:728.3] Out: 70 [Chest Tube:50]   Physical Exam:  Cardiovascular: RRR Pulmonary: Coarse on left Abdomen: Soft, non tender, bowel sounds present. Chest Tube: to suction, + 2-3 air leak  Lab Results: CBC:  Recent Labs  04/06/15 2136 04/07/15 0514  WBC 7.9 7.2  HGB 11.3* 10.7*  HCT 33.8* 33.7*  PLT 122* 101*   BMET:   Recent Labs  04/06/15 2136 04/07/15 0514  NA 141 139  K 3.6 4.0  CL 106 105  CO2 23 23  GLUCOSE 110* 131*  BUN 10 11  CREATININE 1.13* 1.15*  CALCIUM 9.7 9.3    PT/INR:   Recent Labs  04/06/15 2136  LABPROT 14.5  INR 1.11   ABG:  INR: Will add last result for INR, ABG once components are confirmed Will add last 4 CBG results once components are confirmed  Assessment/Plan:  1. CV - SR in the 60's, On HCTZ 12.5 mg daily, Cozaar 100 mg daily, and Lopressor 25 mg daily (with parameters). 2.  Pulmonary - Chest tube with scant output. Chest tube is to suction. There is a +2-3 air leak. CXR this am appears stable (questionable trace left apical pneumothorax). Chest tube to suction for now. Will put TALC once air leak improves.  3. Anemia-H and H 10.7 and 33.7 4. Thrombocytopenia-platelets 101,000 5. Omeprazole as taken prior to  admission  ZIMMERMAN,DONIELLE MPA-C 04/08/2015,8:31 AM  Lung up with persistent air leak-- reduce suction to 10 then water seal if lung stays up  patient examined and medical record reviewed,agree with above note. Tharon Aquas Trigt III 04/08/2015

## 2015-04-09 ENCOUNTER — Inpatient Hospital Stay (HOSPITAL_COMMUNITY): Payer: Medicare Other

## 2015-04-09 NOTE — Plan of Care (Signed)
Problem: Pain Managment: Goal: General experience of comfort will improve Outcome: Completed/Met Date Met:  04/09/15 Patient continues to deny pain. Notes she's just sore at her chest tube site

## 2015-04-09 NOTE — Care Management Important Message (Signed)
Important Message  Patient Details  Name: [name] [name] MRN: [id number] Date of Birth: March 15, 1940   Medicare Important Message Given:  Yes    Nathen May 04/09/2015, 12:28 PM

## 2015-04-09 NOTE — Progress Notes (Addendum)
      [address]       [place] [address]             [phone]            Subjective: Patient without complaints this am.  Objective: Vital signs in last 24 hours: Temp:  [97.6 F (36.4 C)-98.4 F (36.9 C)] 97.6 F (36.4 C) (02/16 0416) Pulse Rate:  [60-86] 62 (02/16 0416) Cardiac Rhythm:  [-] Normal sinus rhythm;Bundle branch block (02/15 2015) Resp:  [16-18] 16 (02/16 0416) BP: (113-140)/(54-59) 140/59 mmHg (02/16 0416) SpO2:  [97 %-99 %] 97 % (02/16 0416) Weight:  [137 lb 2 oz (62.2 kg)] 137 lb 2 oz (62.2 kg) (02/16 0416)      Intake/Output from previous day: 02/15 0701 - 02/16 0700 In: 480 [P.O.:480] Out: 90 [Chest Tube:90]   Physical Exam:  Cardiovascular: RRR Pulmonary: Slightly diminished at bases but otherwise clear. Abdomen: Soft, non tender, bowel sounds present. Chest Tube: to 10 cm suction, + 1 air leak  Lab Results: CBC:  Recent Labs  04/06/15 2136 04/07/15 0514  WBC 7.9 7.2  HGB 11.3* 10.7*  HCT 33.8* 33.7*  PLT 122* 101*   BMET:   Recent Labs  04/06/15 2136 04/07/15 0514  NA 141 139  K 3.6 4.0  CL 106 105  CO2 23 23  GLUCOSE 110* 131*  BUN 10 11  CREATININE 1.13* 1.15*  CALCIUM 9.7 9.3    PT/INR:   Recent Labs  04/06/15 2136  LABPROT 14.5  INR 1.11   ABG:  INR: Will add last result for INR, ABG once components are confirmed Will add last 4 CBG results once components are confirmed  Assessment/Plan:  1. CV - SR in the 60's, On HCTZ 12.5 mg daily, Cozaar 100 mg daily, and Lopressor 25 mg daily (with parameters). 2.  Pulmonary - Chest tube with scant output. Chest tube is to 10 cm suction. There is a +1 air leak. CXR this am appears stable (no pneumothorax). Place chest tube to water seal. Will put TALC once air leak improves, possibly in am. 3. Anemia-last H and H 10.7 and 33.7 4. Thrombocytopenia-last platelets 101,000 5. Patient has not take ecasa in a long time and does not want to. I  discontinued it. Will give SCDs for DVT prophylaxis  ZIMMERMAN,DONIELLE MPA-C 04/09/2015,7:41 AM

## 2015-04-10 ENCOUNTER — Inpatient Hospital Stay (HOSPITAL_COMMUNITY): Payer: Medicare Other

## 2015-04-10 NOTE — Progress Notes (Signed)
Utilization review completed.  

## 2015-04-10 NOTE — Plan of Care (Signed)
Problem: Skin Integrity: Goal: Risk for impaired skin integrity will decrease Outcome: Progressing No signs of skin breakdown  Problem: Activity: Goal: Risk for activity intolerance will decrease Outcome: Progressing Ambulated in hall today, encouraged to ambulate 2-3 x daily  Problem: Nutrition: Goal: Adequate nutrition will be maintained Outcome: Progressing Taking ensure and boost along with meals. Encouraged PO intake for increased calories.

## 2015-04-10 NOTE — Progress Notes (Addendum)
[address]       [place] [address]             [phone]          Subjective: conts with air leak, short episodes of MAT  Objective: Vital signs in last 24 hours: Temp:  [97.2 F (36.2 C)-98.1 F (36.7 C)] 98 F (36.7 C) (02/17 0557) Pulse Rate:  [60-79] 60 (02/17 0557) Cardiac Rhythm:  [-] Normal sinus rhythm (02/16 1900) Resp:  [16-20] 18 (02/17 0557) BP: (110-124)/(47-59) 122/56 mmHg (02/17 0557) SpO2:  [97 %-100 %] 97 % (02/17 0557) Weight:  [137 lb 9.1 oz (62.4 kg)] 137 lb 9.1 oz (62.4 kg) (02/17 0557)  Hemodynamic parameters for last 24 hours:    Intake/Output from previous day: 02/16 0701 - 02/17 0700 In: 54 [P.O.:597] Out: -  Intake/Output this shift:    General appearance: alert, cooperative and no distress Heart: regular rate and rhythm Lungs: fairly clear throughout Abdomen: benign Extremities: PAS on  Lab Results: No results for input(s): WBC, HGB, HCT, PLT in the last 72 hours. BMET: No results for input(s): NA, K, CL, CO2, GLUCOSE, BUN, CREATININE, CALCIUM in the last 72 hours.  PT/INR: No results for input(s): LABPROT, INR in the last 72 hours. ABG    Component Value Date/Time   PHART 7.413* 06/04/2010 0345   HCO3 27.5* 06/04/2010 0345   TCO2 29 06/04/2010 0345   ACIDBASEDEF 0.6 06/01/2010 0930   O2SAT 93.0 06/04/2010 0345   CBG (last 3)  No results for input(s): GLUCAP in the last 72 hours.  Meds Scheduled Meds: . cholecalciferol  1,000 Units Oral Daily  . docusate sodium  100 mg Oral BID  . enoxaparin (LOVENOX) injection  90 mg Subcutaneous Q24H  . feeding supplement  237 mL Oral TID WC  . feeding supplement (ENSURE ENLIVE)  237 mL Oral BID BM  . guaiFENesin  600 mg Oral BID  . hydrochlorothiazide  12.5 mg Oral Daily  . lipase/protease/amylase  36,000 Units Oral Q breakfast  . losartan  100 mg Oral Daily  . metoprolol tartrate  25 mg Oral q morning - 10a  . omeprazole  20 mg Oral Daily   Continuous  Infusions: . dextrose 5 % and 0.45% NaCl 30 mL/hr at 04/09/15 1342   PRN Meds:.acetaminophen **OR** acetaminophen, albuterol, alum & mag hydroxide-simeth, fentaNYL (SUBLIMAZE) injection, HYDROcodone-acetaminophen, magnesium hydroxide, ondansetron **OR** ondansetron (ZOFRAN) IV, sorbitol  Xrays Dg Chest Port 1 View  04/09/2015  CLINICAL DATA:  Follow-up left pneumothorax. EXAM: PORTABLE CHEST 1 VIEW COMPARISON:  April 08, 2015 FINDINGS: The left chest tube is in stable position. No definitive left-sided pneumothorax identified. The right Port-A-Cath is unchanged. Blunting of the left costophrenic angle is stable. Postsurgical changes again seen in the right lung base. A tubular opacity in left mid lung is unchanged as well. Mild patchy opacity in the left base is stable. The cardiomediastinal silhouette is unchanged. No other interval changes. IMPRESSION: Left chest tube with no pneumothorax identified. Otherwise, stable exam. Electronically Signed   By: Dorise Bullion III M.D   On: 04/09/2015 07:39    Assessment/Plan: S/P left chest tube, + large air on H2O seal , CXR is pretty stable- may need further intervention, poss TALC  Patient did not tolerate water seal with recurrence of pneumothorax There is a large air leak with cough We'll plan talc pleurodesis through Pleurx catheter as the patient has metastatic pancreatic cancer, not a candidate for  VATS  patient examined and medical record reviewed,agree with above note. Tharon Aquas Trigt III 04/10/2015     LOS: 4 days    GOLD,WAYNE E 04/10/2015

## 2015-04-11 ENCOUNTER — Inpatient Hospital Stay (HOSPITAL_COMMUNITY): Payer: Medicare Other

## 2015-04-11 NOTE — Progress Notes (Signed)
Pt ambulated 500 ft.Pt tolerared well, Will continue to monitor closely.  Raliegh Ip RN

## 2015-04-11 NOTE — Progress Notes (Addendum)
      [address]       [place] [address]             [phone]      Subjective:  No complaints.  States feels pretty good  Objective: Vital signs in last 24 hours: Temp:  [98.2 F (36.8 C)-98.3 F (36.8 C)] 98.3 F (36.8 C) (02/18 0507) Pulse Rate:  [64-80] 64 (02/18 0507) Cardiac Rhythm:  [-] Normal sinus rhythm (02/17 2105) Resp:  [18] 18 (02/18 0507) BP: (98-126)/(44-66) 126/46 mmHg (02/18 0507) SpO2:  [94 %-99 %] 94 % (02/18 0507) Weight:  [136 lb 3.2 oz (61.78 kg)] 136 lb 3.2 oz (61.78 kg) (02/18 0343)  Intake/Output from previous day: 02/17 0701 - 02/18 0700 In: 360 [P.O.:360] Out: 450 [Urine:400; Chest Tube:50]  General appearance: alert, cooperative and no distress Heart: regular rate and rhythm Lungs: clear to auscultation bilaterally Abdomen: soft, non-tender; bowel sounds normal; no masses,  no organomegaly Wound: clean and dry  Lab Results: No results for input(s): WBC, HGB, HCT, PLT in the last 72 hours. BMET: No results for input(s): NA, K, CL, CO2, GLUCOSE, BUN, CREATININE, CALCIUM in the last 72 hours.  PT/INR: No results for input(s): LABPROT, INR in the last 72 hours. ABG    Component Value Date/Time   PHART 7.413* 06/04/2010 0345   HCO3 27.5* 06/04/2010 0345   TCO2 29 06/04/2010 0345   ACIDBASEDEF 0.6 06/01/2010 0930   O2SAT 93.0 06/04/2010 0345   CBG (last 3)  No results for input(s): GLUCAP in the last 72 hours.  Assessment/Plan:  1. Chest tube- placed back on suction yesterday, CXR with improvement of pneumothorax, air leak remains present at 1-2+ with cough.... Patient states she did not receive talc yesterday 2. CV- NSR, HTN controlled- continue lopressor, cozaar 3. Dispo- patient stable, pneumothorax resolved, leave chest tube on suction will discuss talc pleurodesis with Dr. Prescott Gum   LOS: 5 days    Ellwood Handler 04/11/2015  Proceed with talc pleurodesis due to persistent air leak patient examined and  medical record reviewed,agree with above note. Tharon Aquas Trigt III 04/12/2015

## 2015-04-12 ENCOUNTER — Inpatient Hospital Stay (HOSPITAL_COMMUNITY): Payer: Medicare Other

## 2015-04-12 MED ORDER — SODIUM CHLORIDE 0.9 % IV BOLUS (SEPSIS)
500.0000 mL | INTRAVENOUS | Status: AC
  Administered 2015-04-12: 500 mL via INTRAVENOUS

## 2015-04-12 MED ORDER — TALC 5 G PL SUSR
3.0000 g | Freq: Once | INTRAPLEURAL | Status: AC
  Administered 2015-04-12: 3 g via INTRAPLEURAL
  Filled 2015-04-12: qty 3

## 2015-04-12 NOTE — Progress Notes (Addendum)
      [address]       [place] [address]             [phone]      Subjective:  [name] [name] has no new complaints.  Discussed Talc procedure with patient to be done this morning.  Objective: Vital signs in last 24 hours: Temp:  [97.8 F (36.6 C)-98.4 F (36.9 C)] 97.9 F (36.6 C) (02/19 0444) Pulse Rate:  [63-85] 67 (02/19 0444) Cardiac Rhythm:  [-] Normal sinus rhythm (02/18 2200) Resp:  [18-20] 18 (02/19 0444) BP: (99-139)/(43-67) 138/54 mmHg (02/19 0444) SpO2:  [96 %-99 %] 97 % (02/19 0444) Weight:  [135 lb 12.9 oz (61.6 kg)] 135 lb 12.9 oz (61.6 kg) (02/19 0444)  Intake/Output from previous day: 02/18 0701 - 02/19 0700 In: 480 [P.O.:480] Out: -   General appearance: alert, cooperative and no distress Heart: regular rate and rhythm Lungs: clear to auscultation bilaterally Abdomen: soft, non-tender; bowel sounds normal; no masses,  no organomegaly Wound: clean and dry  Lab Results: No results for input(s): WBC, HGB, HCT, PLT in the last 72 hours. BMET: No results for input(s): NA, K, CL, CO2, GLUCOSE, BUN, CREATININE, CALCIUM in the last 72 hours.  PT/INR: No results for input(s): LABPROT, INR in the last 72 hours. ABG    Component Value Date/Time   PHART 7.413* 06/04/2010 0345   HCO3 27.5* 06/04/2010 0345   TCO2 29 06/04/2010 0345   ACIDBASEDEF 0.6 06/01/2010 0930   O2SAT 93.0 06/04/2010 0345   CBG (last 3)  No results for input(s): GLUCAP in the last 72 hours.  Assessment/Plan:  1. Chest tube- remains on suction, continues to have air leak- will place Talc today 2. CV- remains hemodynamically stable 3. Dispo- continued air leak, will place TALC today, then decrease suction to 10, repeat CXR in AM    LOS: 6 days    [name] [name] 04/12/2015  Patient stable after left talc injection Remain on 10 cm H2O suction Chest x-ray in a.m.patient examined and medical record reviewed,agree with above note. Tharon Aquas Trigt  III 04/12/2015

## 2015-04-13 ENCOUNTER — Inpatient Hospital Stay (HOSPITAL_COMMUNITY): Payer: Medicare Other

## 2015-04-13 NOTE — Progress Notes (Signed)
Utilization review completed.  

## 2015-04-13 NOTE — Care Management Important Message (Signed)
Important Message  Patient Details  Name: [name] [name] MRN: [id number] Date of Birth: September 18, 1940   Medicare Important Message Given:  Yes    Loann Quill 04/13/2015, 11:08 AM

## 2015-04-13 NOTE — Progress Notes (Addendum)
      [address]       [place] [address]             [phone]      Subjective:  Ms. Westbay states she had a good bit of pain last night, especially in her back.  Objective: Vital signs in last 24 hours: Temp:  [97.8 F (36.6 C)-98.3 F (36.8 C)] 97.8 F (36.6 C) (02/20 0532) Pulse Rate:  [66-100] 70 (02/20 0532) Cardiac Rhythm:  [-] Normal sinus rhythm (02/20 0700) Resp:  [18] 18 (02/20 0009) BP: (72-123)/(33-45) 107/45 mmHg (02/20 0532) SpO2:  [94 %-99 %] 97 % (02/20 0532) Weight:  [147 lb 0.8 oz (66.7 kg)] 147 lb 0.8 oz (66.7 kg) (02/20 0532)  Intake/Output from previous day: 02/19 0701 - 02/20 0700 In: 480 [P.O.:480] Out: -   General appearance: alert, cooperative and no distress Heart: regular rate and rhythm Lungs: diminished breath sounds bibasilar Abdomen: soft, non-tender; bowel sounds normal; no masses,  no organomegaly Wound: clean and dr  Lab Results: No results for input(s): WBC, HGB, HCT, PLT in the last 72 hours. BMET: No results for input(s): NA, K, CL, CO2, GLUCOSE, BUN, CREATININE, CALCIUM in the last 72 hours.  PT/INR: No results for input(s): LABPROT, INR in the last 72 hours. ABG    Component Value Date/Time   PHART 7.413* 06/04/2010 0345   HCO3 27.5* 06/04/2010 0345   TCO2 29 06/04/2010 0345   ACIDBASEDEF 0.6 06/01/2010 0930   O2SAT 93.0 06/04/2010 0345   CBG (last 3)  No results for input(s): GLUCAP in the last 72 hours.  Assessment/Plan:  1. Chest tube- talc pleurodesis done yesterday, currently on 10 of suction with 4-5+ air leak with cough- leave on suction today 2. Pulm- CXR is pending, wean oxygen as tolerated 3. CV- remains hemodynamically stable 4. Dispo- patient stable, will review CXR once completed, leave chest tube to suction today   LOS: 7 days    BARRETT, ERIN 04/13/2015  CXR today without pntx Cont suction until tomorrow patient examined and medical record reviewed,agree with above  note. Tharon Aquas Trigt III 04/13/2015

## 2015-04-14 ENCOUNTER — Inpatient Hospital Stay (HOSPITAL_COMMUNITY): Payer: Medicare Other

## 2015-04-14 MED ORDER — SODIUM CHLORIDE 0.9% FLUSH
10.0000 mL | INTRAVENOUS | Status: DC | PRN
  Administered 2015-04-17: 10 mL
  Filled 2015-04-14: qty 40

## 2015-04-14 MED ORDER — FUROSEMIDE 40 MG PO TABS
40.0000 mg | ORAL_TABLET | Freq: Every day | ORAL | Status: DC
  Administered 2015-04-14 – 2015-04-19 (×6): 40 mg via ORAL
  Filled 2015-04-14 (×6): qty 1

## 2015-04-14 MED ORDER — LOSARTAN POTASSIUM 25 MG PO TABS
25.0000 mg | ORAL_TABLET | Freq: Every day | ORAL | Status: DC
Start: 2015-04-15 — End: 2015-04-20
  Administered 2015-04-15 – 2015-04-19 (×5): 25 mg via ORAL
  Filled 2015-04-14 (×5): qty 1

## 2015-04-14 MED ORDER — HEPARIN SOD (PORK) LOCK FLUSH 100 UNIT/ML IV SOLN
500.0000 [IU] | INTRAVENOUS | Status: AC | PRN
  Administered 2015-04-14: 500 [IU]

## 2015-04-14 NOTE — Progress Notes (Addendum)
      [address]       [place] [address]             [phone]      Subjective:  [name] [name] has a cough this morning.  She states its mild but hasn't been able to get anything up.  Objective: Vital signs in last 24 hours: Temp:  [98.2 F (36.8 C)-99 F (37.2 C)] 99 F (37.2 C) (02/21 0440) Pulse Rate:  [80-92] 84 (02/21 0440) Cardiac Rhythm:  [-] Normal sinus rhythm (02/20 1900) Resp:  [18] 18 (02/21 0440) BP: (99-116)/(32-46) 110/44 mmHg (02/21 0440) SpO2:  [94 %-98 %] 94 % (02/21 0440) Weight:  [136 lb 0.4 oz (61.7 kg)] 136 lb 0.4 oz (61.7 kg) (02/21 0440)  Intake/Output from previous day: 02/20 0701 - 02/21 0700 In: 480 [P.O.:480] Out: 1232 [Urine:1100; Stool:2; Chest Tube:130] Intake/Output this shift: Total I/O In: -  Out: 200 [Chest Tube:200]  General appearance: alert, cooperative and no distress Heart: regular rate and rhythm Lungs: diminished breath sounds bibasilar Abdomen: soft, non-tender; bowel sounds normal; no masses,  no organomegaly Wound: clean and dry  Lab Results: No results for input(s): WBC, HGB, HCT, PLT in the last 72 hours. BMET: No results for input(s): NA, K, CL, CO2, GLUCOSE, BUN, CREATININE, CALCIUM in the last 72 hours.  PT/INR: No results for input(s): LABPROT, INR in the last 72 hours. ABG    Component Value Date/Time   PHART 7.413* 06/04/2010 0345   HCO3 27.5* 06/04/2010 0345   TCO2 29 06/04/2010 0345   ACIDBASEDEF 0.6 06/01/2010 0930   O2SAT 93.0 06/04/2010 0345   CBG (last 3)  No results for input(s): GLUCAP in the last 72 hours.  Assessment/Plan:  1. Chest tube- constant air leak currently, 5+ air leak with cough- however CXR shows full expansion of lung- could place patient to water seal to see if she can tolerate, will discuss with staff 2. Pulm- wean oxygen as tolerated, continue IS 3. CV-remains hemodynamically stable 4. Dispo- patient stable, no pneumothorax on CXR, continued air leak, will  discuss possible trial on water seal with staff   LOS: 8 days    Ellwood Handler 04/14/2015  Water seal trial resulted in pneumothorax in patient became symptomatic with shortness of breath. Patient placed back on suction. Continue current care P. Lucianne Lei trigt M.D.

## 2015-04-15 ENCOUNTER — Inpatient Hospital Stay (HOSPITAL_COMMUNITY): Payer: Medicare Other

## 2015-04-15 LAB — CBC
HCT: 29.2 % — ABNORMAL LOW (ref 36.0–46.0)
Hemoglobin: 9.6 g/dL — ABNORMAL LOW (ref 12.0–15.0)
MCH: 31 pg (ref 26.0–34.0)
MCHC: 32.9 g/dL (ref 30.0–36.0)
MCV: 94.2 fL (ref 78.0–100.0)
Platelets: 130 10*3/uL — ABNORMAL LOW (ref 150–400)
RBC: 3.1 MIL/uL — ABNORMAL LOW (ref 3.87–5.11)
RDW: 13.2 % (ref 11.5–15.5)
WBC: 8.9 10*3/uL (ref 4.0–10.5)

## 2015-04-15 LAB — BASIC METABOLIC PANEL WITH GFR
Anion gap: 8 (ref 5–15)
BUN: 41 mg/dL — ABNORMAL HIGH (ref 6–20)
CO2: 28 mmol/L (ref 22–32)
Calcium: 9.3 mg/dL (ref 8.9–10.3)
Chloride: 99 mmol/L — ABNORMAL LOW (ref 101–111)
Creatinine, Ser: 1.27 mg/dL — ABNORMAL HIGH (ref 0.44–1.00)
GFR calc Af Amer: 46 mL/min — ABNORMAL LOW
GFR calc non Af Amer: 40 mL/min — ABNORMAL LOW
Glucose, Bld: 137 mg/dL — ABNORMAL HIGH (ref 65–99)
Potassium: 3.8 mmol/L (ref 3.5–5.1)
Sodium: 135 mmol/L (ref 135–145)

## 2015-04-15 NOTE — Progress Notes (Addendum)
      [address]       [place] [address]             [phone]      Subjective:  Ms. Takacs is feeling better today.  She is not quite as short of breath.  Objective: Vital signs in last 24 hours: Temp:  [97.6 F (36.4 C)-98 F (36.7 C)] 98 F (36.7 C) (02/22 0658) Pulse Rate:  [65-82] 75 (02/22 0658) Cardiac Rhythm:  [-] Normal sinus rhythm (02/21 2000) Resp:  [18] 18 (02/22 0658) BP: (92-114)/(38-47) 114/47 mmHg (02/22 0658) SpO2:  [96 %-98 %] 98 % (02/22 0658) Weight:  [138 lb 3.7 oz (62.7 kg)] 138 lb 3.7 oz (62.7 kg) (02/22 0658)  Intake/Output from previous day: 02/21 0701 - 02/22 0700 In: 970 [P.O.:970] Out: 2280 [Urine:1500; Chest Tube:780]  General appearance: alert, cooperative and no distress Heart: regular rate and rhythm Lungs: diminished breath sounds LLL Abdomen: soft, non-tender; bowel sounds normal; no masses,  no organomegaly Wound: clean and dry  Lab Results:  Recent Labs  04/15/15 0515  WBC 8.9  HGB 9.6*  HCT 29.2*  PLT 130*   BMET:  Recent Labs  04/15/15 0515  NA 135  K 3.8  CL 99*  CO2 28  GLUCOSE 137*  BUN 41*  CREATININE 1.27*  CALCIUM 9.3    PT/INR: No results for input(s): LABPROT, INR in the last 72 hours. ABG    Component Value Date/Time   PHART 7.413* 06/04/2010 0345   HCO3 27.5* 06/04/2010 0345   TCO2 29 06/04/2010 0345   ACIDBASEDEF 0.6 06/01/2010 0930   O2SAT 93.0 06/04/2010 0345   CBG (last 3)  No results for input(s): GLUCAP in the last 72 hours.  Assessment/Plan:  1. Chest tube-did not tolerate trial on water seal yesterday, chest tube placed back on suction... Today large air leak remains, CXR shows improvement of pneumothorax to about 10%... Leave chest tube on suction today 2. Pulm- wean oxygen as tolerated, continue IS 3. CV- remains hemodynamically stable 4. Dispo- worsening pneumothorax on water seal yesterday, leave chest tube on suction, continue current care   LOS: 9 days      Ellwood Handler 04/15/2015  We'll continue chest tube to suction after talc pleurodesis The patient would be a poor candidate for VATS to correct airleak because of COPD and metastatic pancreatic cancer  Treatment goal will  be to get the patient to water seal so she could be followed as an outpatient with a chest tube  patient examined and medical record reviewed,agree with above note. Tharon Aquas Trigt III 04/15/2015  And her son

## 2015-04-16 NOTE — Progress Notes (Signed)
Utilization review completed.  

## 2015-04-16 NOTE — Care Management Note (Signed)
Case Management Note Marvetta Gibbons RN, BSN Unit 2W-Case Manager [phone]  Patient Details  Name: [name] [name] MRN: [id number] Date of Birth: 1940-08-25  Subjective/Objective:   Pt admitted with pntx- with chest tube placement                 Action/Plan: PTA pt lived at home - anticipate return home- may need [facility] if has to go home with chest tube- CM to follow  Expected Discharge Date:                  Expected Discharge Plan:  [place]  In-House Referral:     Discharge planning Services  CM Consult  Post Acute Care Choice:    Choice offered to:     DME Arranged:    DME Agency:     HH Arranged:    [place] Agency:     Status of Service:  In process, will continue to follow  Medicare Important Message Given:  Yes Date Medicare IM Given:    Medicare IM give by:    Date Additional Medicare IM Given:    Additional Medicare Important Message give by:     If discussed at [place] of Stay Meetings, dates discussed:  04/14/15 04/16/15  Additional Comments:  Dawayne Patricia, RN 04/16/2015, 1:34 PM

## 2015-04-16 NOTE — Progress Notes (Addendum)
[address]       [place] [address]             [phone]          Subjective: Some nausea last pm  Objective: Vital signs in last 24 hours: Temp:  [97.6 F (36.4 C)-99.2 F (37.3 C)] 99.2 F (37.3 C) (02/23 0514) Pulse Rate:  [71-83] 71 (02/23 0514) Cardiac Rhythm:  [-] Normal sinus rhythm (02/22 1905) Resp:  [17-18] 18 (02/23 0514) BP: (100-120)/(38-47) 100/38 mmHg (02/23 0514) SpO2:  [92 %-99 %] 92 % (02/23 0514) Weight:  [134 lb 14.7 oz (61.2 kg)] 134 lb 14.7 oz (61.2 kg) (02/23 0514)  Hemodynamic parameters for last 24 hours:    Intake/Output from previous day: 02/22 0701 - 02/23 0700 In: 720 [P.O.:720] Out: 440 [Chest Tube:440] Intake/Output this shift:    General appearance: alert, cooperative and no distress Heart: regular rate and rhythm Lungs: fair air exchange Abdomen: soft, non-tender Extremities: no edema Wound: dressing intact  Lab Results:  Recent Labs  04/15/15 0515  WBC 8.9  HGB 9.6*  HCT 29.2*  PLT 130*   BMET:  Recent Labs  04/15/15 0515  NA 135  K 3.8  CL 99*  CO2 28  GLUCOSE 137*  BUN 41*  CREATININE 1.27*  CALCIUM 9.3    PT/INR: No results for input(s): LABPROT, INR in the last 72 hours. ABG    Component Value Date/Time   PHART 7.413* 06/04/2010 0345   HCO3 27.5* 06/04/2010 0345   TCO2 29 06/04/2010 0345   ACIDBASEDEF 0.6 06/01/2010 0930   O2SAT 93.0 06/04/2010 0345   CBG (last 3)  No results for input(s): GLUCAP in the last 72 hours.  Meds Scheduled Meds: . cholecalciferol  1,000 Units Oral Daily  . docusate sodium  100 mg Oral BID  . enoxaparin (LOVENOX) injection  90 mg Subcutaneous Q24H  . feeding supplement  237 mL Oral TID WC  . feeding supplement (ENSURE ENLIVE)  237 mL Oral BID BM  . furosemide  40 mg Oral Daily  . guaiFENesin  600 mg Oral BID  . hydrochlorothiazide  12.5 mg Oral Daily  . lipase/protease/amylase  36,000 Units Oral Q breakfast  . losartan  25 mg Oral Daily    . metoprolol tartrate  25 mg Oral q morning - 10a  . omeprazole  20 mg Oral Daily   Continuous Infusions: . dextrose 5 % and 0.45% NaCl 30 mL/hr at 04/09/15 1342   PRN Meds:.acetaminophen **OR** acetaminophen, albuterol, alum & mag hydroxide-simeth, fentaNYL (SUBLIMAZE) injection, HYDROcodone-acetaminophen, magnesium hydroxide, ondansetron **OR** ondansetron (ZOFRAN) IV, sodium chloride flush, sorbitol  Xrays Dg Chest Port 1 View  04/15/2015  CLINICAL DATA:  Follow-up pneumothorax with chest tube treatment EXAM: PORTABLE CHEST 1 VIEW COMPARISON:  Portable chest x-ray of April 14, 2015 FINDINGS: The lungs are well-expanded. An approximately 10% left apical pneumothorax persists which has decreased since yesterday's study. The left-sided chest tube tip projects over the lateral aspect of the fourth rib. A small left pleural effusion with basilar atelectasis persists. The right lung is well-expanded. The interstitial markings are coarse but have improved. There is no infiltrate or pneumothorax or pleural effusion on the right. The Port-A-Cath appliance tip projects over the junction of the middle and distal thirds of the SVC. The cardiac silhouette and pulmonary vascularity are normal. IMPRESSION: Interval decrease in the volume of the left-sided pleural effusion such that it now amounts to 10% or less.  There is persistent but improving left basilar atelectasis and probable small left pleural effusion. Electronically Signed   By: David  Martinique M.D.   On: 04/15/2015 07:57   Dg Chest Port 1 View  04/14/2015  CLINICAL DATA:  75 year old female with increase shortness of Breath. Current history of Cancer. Left pneumothorax being treated with chest tube. Initial encounter. EXAM: PORTABLE CHEST 1 VIEW COMPARISON:  0712 hours today and earlier. FINDINGS: Portable AP semi upright view at 1339 hours. Left chest tube remains in place and appears stable. Residual left pneumothorax has mildly increased in size  since this morning, up to moderate. Continued confluent opacity at the left lung base. Stable cardiac size and mediastinal contours. Stable right lung ventilation. IMPRESSION: Stable left chest tube but mildly increased size of the left pneumothorax since this morning (now overall moderate). No new cardiopulmonary abnormality. Electronically Signed   By: Genevie Ann M.D.   On: 04/14/2015 13:50    Assessment/Plan:  1 large air leak persists, conts current rx, ? Candidate for IBV    LOS: 10 days    GOLD,WAYNE E 04/16/2015  Will evaluate for OBV as she is high risk for redo VATS patient examined and medical record reviewed,agree with above note. Tharon Aquas Trigt III 04/17/2015

## 2015-04-16 NOTE — Progress Notes (Signed)
Turned oxygen from 3L to 2L [place], stats are currently at 100%. Patient has no complaints. Will continue to monitor and wean more later on today if tolerated. Cyndia Bent

## 2015-04-17 NOTE — Progress Notes (Addendum)
      [address]       [place] [address]             [phone]      Procedure(s) (LRB): VIDEO ASSISTED THORACOSCOPY (Left) STAPLING OF BLEBS (Left)  Subjective:  Ms. Hosie continues to have no complaints.   Objective: Vital signs in last 24 hours: Temp:  [98.1 F (36.7 C)-98.8 F (37.1 C)] 98.1 F (36.7 C) (02/24 0530) Pulse Rate:  [70-73] 73 (02/24 0530) Cardiac Rhythm:  [-] Normal sinus rhythm (02/23 2020) Resp:  [19] 19 (02/24 0530) BP: (101-115)/(42-47) 115/47 mmHg (02/24 0530) SpO2:  [92 %-99 %] 92 % (02/24 0530)  Intake/Output from previous day: 02/23 0701 - 02/24 0700 In: 440 [P.O.:440] Out: -   General appearance: alert, cooperative and no distress Heart: regular rate and rhythm Lungs: fair air exchange Abdomen: soft, non-tender; bowel sounds normal; no masses,  no organomegaly Wound: clean and dry  Lab Results:  Recent Labs  04/15/15 0515  WBC 8.9  HGB 9.6*  HCT 29.2*  PLT 130*   BMET:  Recent Labs  04/15/15 0515  NA 135  K 3.8  CL 99*  CO2 28  GLUCOSE 137*  BUN 41*  CREATININE 1.27*  CALCIUM 9.3    PT/INR: No results for input(s): LABPROT, INR in the last 72 hours. ABG    Component Value Date/Time   PHART 7.413* 06/04/2010 0345   HCO3 27.5* 06/04/2010 0345   TCO2 29 06/04/2010 0345   ACIDBASEDEF 0.6 06/01/2010 0930   O2SAT 93.0 06/04/2010 0345   CBG (last 3)  No results for input(s): GLUCAP in the last 72 hours.  Assessment/Plan: S/P Procedure(s) (LRB): VIDEO ASSISTED THORACOSCOPY (Left) STAPLING OF BLEBS (Left)  1. Chest tube- continues to have large air leak, possibly candidate for IBV, leave chest tube on suction for now   LOS: 11 days    BARRETT, ERIN 04/17/2015  Discussed intrabronchial valve therapy for her large airleak with colleagues. I believe this would just be temporizing at best and would not significantly impact her severe airleak which could be either from metastatic pancreatic  cancer metastasis or a ruptured bleb from her COPD. We'll proceed with left VATS and closure of airleak with talc pleurodesis in the OR Monday.

## 2015-04-17 NOTE — Care Management Important Message (Signed)
Important Message  Patient Details  Name: [name] [name] MRN: [id number] Date of Birth: January 26, 1941   Medicare Important Message Given:  Yes    [name] Abena 04/17/2015, 12:00 PM

## 2015-04-18 ENCOUNTER — Inpatient Hospital Stay (HOSPITAL_COMMUNITY): Payer: Medicare Other

## 2015-04-18 DIAGNOSIS — J9311 Primary spontaneous pneumothorax: Secondary | ICD-10-CM

## 2015-04-18 NOTE — Progress Notes (Addendum)
[address]       ,[place] [address]             [phone]        Procedure(s) (LRB): VIDEO ASSISTED THORACOSCOPY (Left) STAPLING OF BLEBS (Left) Subjective: Feels about the same  Objective: Vital signs in last 24 hours: Temp:  [97.7 F (36.5 C)-98.3 F (36.8 C)] 98 F (36.7 C) (02/25 0414) Pulse Rate:  [70-86] 76 (02/25 0414) Cardiac Rhythm:  [-] Normal sinus rhythm (02/24 1955) Resp:  [16-18] 18 (02/25 0414) BP: (99-125)/(41-53) 114/41 mmHg (02/25 0414) SpO2:  [92 %-99 %] 95 % (02/25 0414)  Hemodynamic parameters for last 24 hours:    Intake/Output from previous day: 02/24 0701 - 02/25 0700 In: 240 [P.O.:240] Out: 150 [Chest Tube:150] Intake/Output this shift:    General appearance: alert, cooperative and no distress Heart: regular rate and rhythm Lungs: minor crackles  Lab Results: No results for input(s): WBC, HGB, HCT, PLT in the last 72 hours. BMET: No results for input(s): NA, K, CL, CO2, GLUCOSE, BUN, CREATININE, CALCIUM in the last 72 hours.  PT/INR: No results for input(s): LABPROT, INR in the last 72 hours. ABG    Component Value Date/Time   PHART 7.413* 06/04/2010 0345   HCO3 27.5* 06/04/2010 0345   TCO2 29 06/04/2010 0345   ACIDBASEDEF 0.6 06/01/2010 0930   O2SAT 93.0 06/04/2010 0345   CBG (last 3)  No results for input(s): GLUCAP in the last 72 hours.  Meds Scheduled Meds: . cholecalciferol  1,000 Units Oral Daily  . docusate sodium  100 mg Oral BID  . enoxaparin (LOVENOX) injection  90 mg Subcutaneous Q24H  . feeding supplement  237 mL Oral TID WC  . feeding supplement (ENSURE ENLIVE)  237 mL Oral BID BM  . furosemide  40 mg Oral Daily  . guaiFENesin  600 mg Oral BID  . hydrochlorothiazide  12.5 mg Oral Daily  . lipase/protease/amylase  36,000 Units Oral Q breakfast  . losartan  25 mg Oral Daily  . metoprolol tartrate  25 mg Oral q morning - 10a  . omeprazole  20 mg Oral Daily   Continuous Infusions: .  dextrose 5 % and 0.45% NaCl 30 mL/hr at 04/09/15 1342   PRN Meds:.acetaminophen **OR** acetaminophen, albuterol, alum & mag hydroxide-simeth, fentaNYL (SUBLIMAZE) injection, HYDROcodone-acetaminophen, magnesium hydroxide, ondansetron **OR** ondansetron (ZOFRAN) IV, sodium chloride flush, sorbitol  Xrays Dg Chest Port 1 View  04/18/2015  CLINICAL DATA:  Chest tube in place EXAM: PORTABLE CHEST 1 VIEW COMPARISON:  April 15, 2015 FINDINGS: A left chest tube is in stable position. The left apical pneumothorax measures 5 mm, smaller in the interval. Effusion an opacity remains in the left lung base, mildly worsened in the interval. Mild increased opacity in the right lateral lung base could be atelectasis. No change in the cardiomediastinal silhouette. IMPRESSION: 1. The left apical pneumothorax is slightly smaller in the interval. The left chest tube remains in place. 2. Effusion and opacity in the left lung base is mildly worsened in the interval. Mild increased opacity in the lateral right lung base as well. Electronically Signed   By: Dorise Bullion III M.D   On: 04/18/2015 07:36    Assessment/Plan: S/P Procedure(s) (LRB): VIDEO ASSISTED THORACOSCOPY (Left) STAPLING OF BLEBS (Left)  1 large air leak persisits, conts current management with paln for surgery as outlined   LOS: 12 days    GOLD,WAYNE E 04/18/2015  I have seen and  examined the patient and agree with the assessment and plan as outlined.  Rexene Alberts, MD 04/18/2015 12:26 PM

## 2015-04-19 LAB — BLOOD GAS, ARTERIAL
Acid-base deficit: 0.2 mmol/L (ref 0.0–2.0)
Bicarbonate: 23.1 mEq/L (ref 20.0–24.0)
Drawn by: 31207
FIO2: 0.21
O2 Saturation: 90.8 %
Patient temperature: 98.6
TCO2: 24.2 mmol/L (ref 0–100)
pCO2 arterial: 32.9 mmHg — ABNORMAL LOW (ref 35.0–45.0)
pH, Arterial: 7.462 — ABNORMAL HIGH (ref 7.350–7.450)
pO2, Arterial: 63.8 mmHg — ABNORMAL LOW (ref 80.0–100.0)

## 2015-04-19 LAB — URINALYSIS, ROUTINE W REFLEX MICROSCOPIC
Bilirubin Urine: NEGATIVE
Glucose, UA: NEGATIVE mg/dL
Hgb urine dipstick: NEGATIVE
Ketones, ur: NEGATIVE mg/dL
Nitrite: NEGATIVE
Protein, ur: NEGATIVE mg/dL
Specific Gravity, Urine: 1.013 (ref 1.005–1.030)
pH: 5 (ref 5.0–8.0)

## 2015-04-19 LAB — URINE MICROSCOPIC-ADD ON

## 2015-04-19 LAB — PROTIME-INR
INR: 1.09 (ref 0.00–1.49)
Prothrombin Time: 14.3 seconds (ref 11.6–15.2)

## 2015-04-19 LAB — MRSA PCR SCREENING: MRSA by PCR: NEGATIVE

## 2015-04-19 LAB — PREPARE RBC (CROSSMATCH)

## 2015-04-19 LAB — APTT: aPTT: 27 seconds (ref 24–37)

## 2015-04-19 MED ORDER — DEXTROSE 5 % IV SOLN
1.5000 g | Freq: Once | INTRAVENOUS | Status: DC
  Filled 2015-04-19: qty 1.5

## 2015-04-19 MED ORDER — DEXTROSE 5 % IV SOLN
1.5000 g | INTRAVENOUS | Status: DC
  Filled 2015-04-19: qty 1.5

## 2015-04-19 NOTE — Progress Notes (Signed)
Pt Ramey turned off for ABG then back on post ABG draw. SATs did not drop on RA. RN aware.

## 2015-04-19 NOTE — Progress Notes (Signed)
Assumed care from [facility], [place].

## 2015-04-19 NOTE — Anesthesia Preprocedure Evaluation (Addendum)
Anesthesia Evaluation  Patient identified by MRN, date of birth, ID band Patient awake    Reviewed: Allergy & Precautions, NPO status , Patient's Chart, lab work & pertinent test results  History of Anesthesia Complications Negative for: history of anesthetic complications  Airway Mallampati: II   Neck ROM: Full    Dental  (+) Dental Advisory Given, Partial Lower, Partial Upper   Pulmonary COPD, former smoker,    breath sounds clear to auscultation       Cardiovascular hypertension, Pt. on medications and Pt. on home beta blockers + Peripheral Vascular Disease   Rhythm:Regular Rate:Normal     Neuro/Psych negative neurological ROS     GI/Hepatic negative GI ROS, Neg liver ROS,   Endo/Other  negative endocrine ROS  Renal/GU CRFRenal disease     Musculoskeletal   Abdominal   Peds  Hematology  (+) anemia ,   Anesthesia Other Findings   Reproductive/Obstetrics                           Lab Results  Component Value Date   WBC 8.9 04/15/2015   HGB 9.6* 04/15/2015   HCT 29.2* 04/15/2015   MCV 94.2 04/15/2015   PLT 130* 04/15/2015   Lab Results  Component Value Date   CREATININE 1.27* 04/15/2015   BUN 41* 04/15/2015   NA 135 04/15/2015   K 3.8 04/15/2015   CL 99* 04/15/2015   CO2 28 04/15/2015    Anesthesia Physical Anesthesia Plan  ASA: III  Anesthesia Plan: General   Post-op Pain Management:    Induction: Intravenous  Airway Management Planned: Double Lumen EBT  Additional Equipment: Arterial line  Intra-op Plan:   Post-operative Plan: Extubation in OR  Informed Consent: I have reviewed the patients History and Physical, chart, labs and discussed the procedure including the risks, benefits and alternatives for the proposed anesthesia with the patient or authorized representative who has indicated his/her understanding and acceptance.   Dental advisory given  Plan  Discussed with: CRNA  Anesthesia Plan Comments:        Anesthesia Quick Evaluation

## 2015-04-19 NOTE — Progress Notes (Addendum)
[address]       [place] [address]             [phone]        Procedure(s) (LRB): VIDEO ASSISTED THORACOSCOPY (Left) STAPLING OF BLEBS (Left) Subjective: Remains in good spirits  Objective: Vital signs in last 24 hours: Temp:  [97.5 F (36.4 C)-98.1 F (36.7 C)] 97.5 F (36.4 C) (02/26 0431) Pulse Rate:  [70-77] 72 (02/26 0431) Cardiac Rhythm:  [-] Normal sinus rhythm (02/25 1900) Resp:  [18] 18 (02/26 0431) BP: (95-105)/(35-48) 103/48 mmHg (02/26 0431) SpO2:  [96 %-97 %] 96 % (02/26 0431) Weight:  [139 lb 11.2 oz (63.368 kg)] 139 lb 11.2 oz (63.368 kg) (02/26 0431)  Hemodynamic parameters for last 24 hours:    Intake/Output from previous day: 02/25 0701 - 02/26 0700 In: 240 [P.O.:240] Out: 200 [Chest Tube:200] Intake/Output this shift:    General appearance: alert, cooperative and no distress Heart: regular rate and rhythm Lungs: fair air exchange throughout  Lab Results: No results for input(s): WBC, HGB, HCT, PLT in the last 72 hours. BMET: No results for input(s): NA, K, CL, CO2, GLUCOSE, BUN, CREATININE, CALCIUM in the last 72 hours.  PT/INR: No results for input(s): LABPROT, INR in the last 72 hours. ABG    Component Value Date/Time   PHART 7.413* 06/04/2010 0345   HCO3 27.5* 06/04/2010 0345   TCO2 29 06/04/2010 0345   ACIDBASEDEF 0.6 06/01/2010 0930   O2SAT 93.0 06/04/2010 0345   CBG (last 3)  No results for input(s): GLUCAP in the last 72 hours.  Meds Scheduled Meds: . cholecalciferol  1,000 Units Oral Daily  . docusate sodium  100 mg Oral BID  . enoxaparin (LOVENOX) injection  90 mg Subcutaneous Q24H  . feeding supplement  237 mL Oral TID WC  . feeding supplement (ENSURE ENLIVE)  237 mL Oral BID BM  . furosemide  40 mg Oral Daily  . guaiFENesin  600 mg Oral BID  . hydrochlorothiazide  12.5 mg Oral Daily  . lipase/protease/amylase  36,000 Units Oral Q breakfast  . losartan  25 mg Oral Daily  . metoprolol  tartrate  25 mg Oral q morning - 10a  . omeprazole  20 mg Oral Daily   Continuous Infusions: . dextrose 5 % and 0.45% NaCl 30 mL/hr at 04/18/15 1024   PRN Meds:.acetaminophen **OR** acetaminophen, albuterol, alum & mag hydroxide-simeth, fentaNYL (SUBLIMAZE) injection, HYDROcodone-acetaminophen, magnesium hydroxide, ondansetron **OR** ondansetron (ZOFRAN) IV, sodium chloride flush, sorbitol  Xrays Dg Chest Port 1 View  04/18/2015  CLINICAL DATA:  Chest tube in place EXAM: PORTABLE CHEST 1 VIEW COMPARISON:  April 15, 2015 FINDINGS: A left chest tube is in stable position. The left apical pneumothorax measures 5 mm, smaller in the interval. Effusion an opacity remains in the left lung base, mildly worsened in the interval. Mild increased opacity in the right lateral lung base could be atelectasis. No change in the cardiomediastinal silhouette. IMPRESSION: 1. The left apical pneumothorax is slightly smaller in the interval. The left chest tube remains in place. 2. Effusion and opacity in the left lung base is mildly worsened in the interval. Mild increased opacity in the lateral right lung base as well. Electronically Signed   By: Dorise Bullion III M.D   On: 04/18/2015 07:36    Assessment/Plan:  1 Air leak remains large, clinically she is stable and tolerating on 2 liters [place]- plan is for surgery in am unless something  changes in status      LOS: 13 days    GOLD,WAYNE E 04/19/2015  I have seen and examined the patient and agree with the assessment and plan as outlined.  Rexene Alberts, MD 04/19/2015 10:13 AM

## 2015-04-20 ENCOUNTER — Inpatient Hospital Stay (HOSPITAL_COMMUNITY): Payer: Medicare Other

## 2015-04-20 ENCOUNTER — Inpatient Hospital Stay (HOSPITAL_COMMUNITY): Payer: Medicare Other | Admitting: Certified Registered Nurse Anesthetist

## 2015-04-20 ENCOUNTER — Encounter (HOSPITAL_COMMUNITY)
Admission: EM | Disposition: A | Discharge status: Home / Self Care | Payer: Self-pay | Source: Home / Self Care | Attending: Cardiothoracic Surgery

## 2015-04-20 HISTORY — PX: STAPLING OF BLEBS: SHX6429

## 2015-04-20 HISTORY — PX: VIDEO ASSISTED THORACOSCOPY: SHX5073

## 2015-04-20 LAB — POCT I-STAT 3, ART BLOOD GAS (G3+)
Acid-Base Excess: 2 mmol/L (ref 0.0–2.0)
Bicarbonate: 26.7 mEq/L — ABNORMAL HIGH (ref 20.0–24.0)
O2 Saturation: 96 %
Patient temperature: 98.6
TCO2: 28 mmol/L (ref 0–100)
pCO2 arterial: 42.3 mmHg (ref 35.0–45.0)
pH, Arterial: 7.408 (ref 7.350–7.450)
pO2, Arterial: 81 mmHg (ref 80.0–100.0)

## 2015-04-20 LAB — POCT I-STAT 4, (NA,K, GLUC, HGB,HCT)
Glucose, Bld: 150 mg/dL — ABNORMAL HIGH (ref 65–99)
HCT: 29 % — ABNORMAL LOW (ref 36.0–46.0)
Hemoglobin: 9.9 g/dL — ABNORMAL LOW (ref 12.0–15.0)
Potassium: 4 mmol/L (ref 3.5–5.1)
Sodium: 135 mmol/L (ref 135–145)

## 2015-04-20 SURGERY — VIDEO ASSISTED THORACOSCOPY
Anesthesia: General | Site: Chest | Laterality: Left

## 2015-04-20 MED ORDER — PHENYLEPHRINE HCL 10 MG/ML IJ SOLN
INTRAMUSCULAR | Status: DC | PRN
  Administered 2015-04-20: 40 ug via INTRAVENOUS

## 2015-04-20 MED ORDER — ACETAMINOPHEN 500 MG PO TABS
1000.0000 mg | ORAL_TABLET | Freq: Four times a day (QID) | ORAL | Status: AC
  Administered 2015-04-20 – 2015-04-25 (×15): 1000 mg via ORAL
  Filled 2015-04-20 (×17): qty 2

## 2015-04-20 MED ORDER — DIPHENHYDRAMINE HCL 50 MG/ML IJ SOLN: 12.5000 mg | Freq: Four times a day (QID) | INTRAMUSCULAR | Status: DC | PRN

## 2015-04-20 MED ORDER — TALC 5 G PL SUSR
5.0000 g | Freq: Once | INTRAPLEURAL | Status: AC
  Administered 2015-04-20: 5 g via INTRAPLEURAL
  Filled 2015-04-20: qty 5

## 2015-04-20 MED ORDER — HEMOSTATIC AGENTS (NO CHARGE) OPTIME
TOPICAL | Status: DC | PRN
  Administered 2015-04-20: 1 via TOPICAL

## 2015-04-20 MED ORDER — ONDANSETRON HCL 4 MG/2ML IJ SOLN
INTRAMUSCULAR | Status: DC | PRN
  Administered 2015-04-20: 4 mg via INTRAVENOUS

## 2015-04-20 MED ORDER — HEMOSTATIC AGENTS (NO CHARGE) OPTIME
TOPICAL | Status: DC | PRN
  Administered 2015-04-20 (×3): 1 via TOPICAL

## 2015-04-20 MED ORDER — DEXTROSE-NACL 5-0.9 % IV SOLN
INTRAVENOUS | Status: DC
  Administered 2015-04-20: 100 mL via INTRAVENOUS
  Administered 2015-04-21: 01:00:00 via INTRAVENOUS

## 2015-04-20 MED ORDER — PROPOFOL 10 MG/ML IV BOLUS
INTRAVENOUS | Status: DC | PRN
  Administered 2015-04-20 (×2): 20 mg via INTRAVENOUS
  Administered 2015-04-20: 150 mg via INTRAVENOUS

## 2015-04-20 MED ORDER — FENTANYL CITRATE (PF) 250 MCG/5ML IJ SOLN
INTRAMUSCULAR | Status: AC
  Filled 2015-04-20: qty 5

## 2015-04-20 MED ORDER — PROMETHAZINE HCL 25 MG/ML IJ SOLN
INTRAMUSCULAR | Status: AC
  Filled 2015-04-20: qty 1

## 2015-04-20 MED ORDER — SUGAMMADEX SODIUM 200 MG/2ML IV SOLN
INTRAVENOUS | Status: AC
  Filled 2015-04-20: qty 2

## 2015-04-20 MED ORDER — PHENYLEPHRINE HCL 10 MG/ML IJ SOLN
10.0000 mg | INTRAVENOUS | Status: DC | PRN
  Administered 2015-04-20: 15 ug/min via INTRAVENOUS

## 2015-04-20 MED ORDER — DEXTROSE 5 % IV SOLN: 100.0000 g | INTRAVENOUS | Status: DC | PRN

## 2015-04-20 MED ORDER — SODIUM CHLORIDE 0.9% FLUSH: 9.0000 mL | INTRAVENOUS | Status: DC | PRN

## 2015-04-20 MED ORDER — OMEPRAZOLE 20 MG PO CPDR
20.0000 mg | DELAYED_RELEASE_CAPSULE | Freq: Two times a day (BID) | ORAL | Status: DC
  Administered 2015-04-20 – 2015-04-28 (×16): 20 mg via ORAL
  Filled 2015-04-20 (×19): qty 1

## 2015-04-20 MED ORDER — ONDANSETRON HCL 4 MG/2ML IJ SOLN: 4.0000 mg | Freq: Four times a day (QID) | INTRAMUSCULAR | Status: DC | PRN

## 2015-04-20 MED ORDER — LACTATED RINGERS IV SOLN
INTRAVENOUS | Status: DC | PRN
  Administered 2015-04-20: 07:00:00 via INTRAVENOUS

## 2015-04-20 MED ORDER — OXYCODONE HCL 5 MG PO TABS: 5.0000 mg | ORAL_TABLET | ORAL | Status: DC | PRN

## 2015-04-20 MED ORDER — PROPOFOL 10 MG/ML IV BOLUS
INTRAVENOUS | Status: AC
  Filled 2015-04-20: qty 20

## 2015-04-20 MED ORDER — 0.9 % SODIUM CHLORIDE (POUR BTL) OPTIME
TOPICAL | Status: DC | PRN
  Administered 2015-04-20: 7000 mL

## 2015-04-20 MED ORDER — FENTANYL CITRATE (PF) 100 MCG/2ML IJ SOLN
INTRAMUSCULAR | Status: DC | PRN
  Administered 2015-04-20: 50 ug via INTRAVENOUS
  Administered 2015-04-20: 100 ug via INTRAVENOUS
  Administered 2015-04-20 (×5): 50 ug via INTRAVENOUS

## 2015-04-20 MED ORDER — ACETAMINOPHEN 160 MG/5ML PO SOLN: 1000.0000 mg | Freq: Four times a day (QID) | ORAL | Status: AC

## 2015-04-20 MED ORDER — TRAMADOL HCL 50 MG PO TABS: 50.0000 mg | ORAL_TABLET | Freq: Four times a day (QID) | ORAL | Status: DC | PRN

## 2015-04-20 MED ORDER — SENNOSIDES-DOCUSATE SODIUM 8.6-50 MG PO TABS
1.0000 | ORAL_TABLET | Freq: Every day | ORAL | Status: DC
  Administered 2015-04-20 – 2015-04-25 (×3): 1 via ORAL
  Filled 2015-04-20 (×5): qty 1

## 2015-04-20 MED ORDER — NALOXONE HCL 0.4 MG/ML IJ SOLN: 0.4000 mg | INTRAMUSCULAR | Status: DC | PRN

## 2015-04-20 MED ORDER — MIDAZOLAM HCL 2 MG/2ML IJ SOLN
INTRAMUSCULAR | Status: AC
  Filled 2015-04-20: qty 2

## 2015-04-20 MED ORDER — DIPHENHYDRAMINE HCL 12.5 MG/5ML PO ELIX: 12.5000 mg | ORAL_SOLUTION | Freq: Four times a day (QID) | ORAL | Status: DC | PRN

## 2015-04-20 MED ORDER — PROMETHAZINE HCL 25 MG/ML IJ SOLN
6.2500 mg | INTRAMUSCULAR | Status: DC | PRN
  Administered 2015-04-20: 6.25 mg via INTRAVENOUS

## 2015-04-20 MED ORDER — LIDOCAINE HCL (CARDIAC) 20 MG/ML IV SOLN
INTRAVENOUS | Status: DC | PRN
  Administered 2015-04-20: 60 mg via INTRAVENOUS

## 2015-04-20 MED ORDER — MIDAZOLAM HCL 5 MG/5ML IJ SOLN
INTRAMUSCULAR | Status: DC | PRN
  Administered 2015-04-20: 0.5 mg via INTRAVENOUS

## 2015-04-20 MED ORDER — ESMOLOL HCL 100 MG/10ML IV SOLN
INTRAVENOUS | Status: DC | PRN
  Administered 2015-04-20 (×3): 10 mg via INTRAVENOUS

## 2015-04-20 MED ORDER — FENTANYL 40 MCG/ML IV SOLN
INTRAVENOUS | Status: DC
  Administered 2015-04-20: 12:00:00 via INTRAVENOUS
  Administered 2015-04-20: 150 ug via INTRAVENOUS
  Administered 2015-04-20: 105 ug via INTRAVENOUS
  Administered 2015-04-21: 15 ug via INTRAVENOUS
  Administered 2015-04-21: 30 ug via INTRAVENOUS
  Administered 2015-04-21: 75 ug via INTRAVENOUS
  Administered 2015-04-22: 90 ug via INTRAVENOUS
  Administered 2015-04-22: 30 ug via INTRAVENOUS
  Administered 2015-04-23: 15 ug via INTRAVENOUS
  Administered 2015-04-23: 45 ug via INTRAVENOUS
  Administered 2015-04-23: 75 ug via INTRAVENOUS
  Administered 2015-04-23: 30 ug via INTRAVENOUS
  Administered 2015-04-24: 15 ug via INTRAVENOUS
  Administered 2015-04-24 (×3): 1 ug via INTRAVENOUS
  Administered 2015-04-25: 30 ug via INTRAVENOUS
  Administered 2015-04-25: 15 ug via INTRAVENOUS
  Administered 2015-04-25: 45 ug via INTRAVENOUS
  Administered 2015-04-25: 15 ug via INTRAVENOUS
  Administered 2015-04-26 (×2): 30 ug via INTRAVENOUS
  Administered 2015-04-26: 09:00:00 via INTRAVENOUS
  Administered 2015-04-26 (×2): 15 ug via INTRAVENOUS
  Administered 2015-04-27: 30 ug via INTRAVENOUS
  Filled 2015-04-20: qty 25

## 2015-04-20 MED ORDER — POTASSIUM CHLORIDE 10 MEQ/50ML IV SOLN: 10.0000 meq | Freq: Every day | INTRAVENOUS | Status: DC | PRN

## 2015-04-20 MED ORDER — SODIUM CHLORIDE 0.9 % IR SOLN
Status: DC | PRN
  Administered 2015-04-20: 500 mL

## 2015-04-20 MED ORDER — FENTANYL 40 MCG/ML IV SOLN
INTRAVENOUS | Status: AC
  Filled 2015-04-20: qty 25

## 2015-04-20 MED ORDER — SUGAMMADEX SODIUM 200 MG/2ML IV SOLN
INTRAVENOUS | Status: DC | PRN
  Administered 2015-04-20: 126.8 mg via INTRAVENOUS

## 2015-04-20 MED ORDER — CEFUROXIME SODIUM 1.5 G IJ SOLR
1.5000 g | Freq: Two times a day (BID) | INTRAMUSCULAR | Status: AC
  Administered 2015-04-20 – 2015-04-21 (×2): 1.5 g via INTRAVENOUS
  Filled 2015-04-20 (×2): qty 1.5

## 2015-04-20 MED ORDER — LACTATED RINGERS IV SOLN
INTRAVENOUS | Status: DC | PRN
  Administered 2015-04-20: 08:00:00 via INTRAVENOUS

## 2015-04-20 MED ORDER — HYDROMORPHONE HCL 1 MG/ML IJ SOLN
INTRAMUSCULAR | Status: AC
  Filled 2015-04-20: qty 1

## 2015-04-20 MED ORDER — BISACODYL 5 MG PO TBEC
10.0000 mg | DELAYED_RELEASE_TABLET | Freq: Every day | ORAL | Status: DC
  Administered 2015-04-23: 10 mg via ORAL
  Filled 2015-04-20 (×5): qty 2

## 2015-04-20 MED ORDER — ROCURONIUM BROMIDE 100 MG/10ML IV SOLN
INTRAVENOUS | Status: DC | PRN
  Administered 2015-04-20: 5 mg via INTRAVENOUS
  Administered 2015-04-20: 40 mg via INTRAVENOUS
  Administered 2015-04-20 (×5): 10 mg via INTRAVENOUS

## 2015-04-20 MED ORDER — HYDROMORPHONE HCL 1 MG/ML IJ SOLN
0.2500 mg | INTRAMUSCULAR | Status: DC | PRN
  Administered 2015-04-20: 0.5 mg via INTRAVENOUS

## 2015-04-20 SURGICAL SUPPLY — 78 items
ADH SKN CLS APL DERMABOND .7 (GAUZE/BANDAGES/DRESSINGS)
APL SRG 7X2 LUM MLBL SLNT (VASCULAR PRODUCTS) ×6
APPLICATOR TIP COSEAL (VASCULAR PRODUCTS) ×12 IMPLANT
BAG DECANTER FOR FLEXI CONT (MISCELLANEOUS) IMPLANT
BLADE SURG 11 STRL SS (BLADE) ×5 IMPLANT
CANISTER SUCTION 2500CC (MISCELLANEOUS) ×3 IMPLANT
CATH KIT ON Q 5IN SLV (PAIN MANAGEMENT) IMPLANT
CATH ROBINSON RED A/P 22FR (CATHETERS) ×4 IMPLANT
CATH THORACIC 28FR (CATHETERS) IMPLANT
CATH THORACIC 36FR (CATHETERS) IMPLANT
CATH THORACIC 36FR RT ANG (CATHETERS) IMPLANT
CLIP TI MEDIUM 24 (CLIP) ×2 IMPLANT
CONT SPEC 4OZ CLIKSEAL STRL BL (MISCELLANEOUS) ×24 IMPLANT
COVER SURGICAL LIGHT HANDLE (MISCELLANEOUS) ×6 IMPLANT
DERMABOND ADVANCED (GAUZE/BANDAGES/DRESSINGS)
DERMABOND ADVANCED .7 DNX12 (GAUZE/BANDAGES/DRESSINGS) IMPLANT
DRAPE LAPAROSCOPIC ABDOMINAL (DRAPES) ×3 IMPLANT
DRAPE WARM FLUID 44X44 (DRAPE) ×3 IMPLANT
ELECT BLADE 4.0 EZ CLEAN MEGAD (MISCELLANEOUS) ×3
ELECT REM PT RETURN 9FT ADLT (ELECTROSURGICAL) ×3
ELECTRODE BLDE 4.0 EZ CLN MEGD (MISCELLANEOUS) IMPLANT
ELECTRODE REM PT RTRN 9FT ADLT (ELECTROSURGICAL) ×1 IMPLANT
GAUZE SPONGE 4X4 12PLY STRL (GAUZE/BANDAGES/DRESSINGS) ×3 IMPLANT
GAUZE XEROFORM 1X8 LF (GAUZE/BANDAGES/DRESSINGS) ×2 IMPLANT
GLOVE BIO SURGEON STRL SZ 6.5 (GLOVE) ×2 IMPLANT
GLOVE BIO SURGEON STRL SZ7.5 (GLOVE) ×12 IMPLANT
GLOVE BIO SURGEONS STRL SZ 6.5 (GLOVE) ×1
GOWN STRL REUS W/ TWL LRG LVL3 (GOWN DISPOSABLE) ×3 IMPLANT
GOWN STRL REUS W/TWL LRG LVL3 (GOWN DISPOSABLE) ×9
KIT BASIN OR (CUSTOM PROCEDURE TRAY) ×3 IMPLANT
KIT ROOM TURNOVER OR (KITS) ×3 IMPLANT
KIT SUCTION CATH 14FR (SUCTIONS) ×5 IMPLANT
NS IRRIG 1000ML POUR BTL (IV SOLUTION) ×16 IMPLANT
PACK CHEST (CUSTOM PROCEDURE TRAY) ×3 IMPLANT
PAD ARMBOARD 7.5X6 YLW CONV (MISCELLANEOUS) ×6 IMPLANT
PASSER SUT SWANSON 36MM LOOP (INSTRUMENTS) ×3 IMPLANT
PATCH CORMATRIX 4CMX7CM (Prosthesis & Implant Heart) ×2 IMPLANT
RELOAD GOLD ECHELON 45 (STAPLE) ×8 IMPLANT
SEALANT PROGEL (MISCELLANEOUS) ×2 IMPLANT
SEALANT SURG COSEAL 4ML (VASCULAR PRODUCTS) IMPLANT
SEALANT SURG COSEAL 8ML (VASCULAR PRODUCTS) ×2 IMPLANT
SOLUTION ANTI FOG 6CC (MISCELLANEOUS) ×3 IMPLANT
SPONGE GAUZE 4X4 12PLY STER LF (GAUZE/BANDAGES/DRESSINGS) ×2 IMPLANT
SPONGE LAP 4X18 X RAY DECT (DISPOSABLE) ×3 IMPLANT
SPONGE TONSIL 1 RF SGL (DISPOSABLE) ×6 IMPLANT
STAPLER ECHELON POWERED (MISCELLANEOUS) ×3 IMPLANT
SUT CHROMIC 3 0 SH 27 (SUTURE) ×20 IMPLANT
SUT ETHILON 3 0 PS 1 (SUTURE) ×2 IMPLANT
SUT PROLENE 3 0 SH DA (SUTURE) IMPLANT
SUT PROLENE 4 0 RB 1 (SUTURE)
SUT PROLENE 4 0 SH DA (SUTURE) ×18 IMPLANT
SUT PROLENE 4-0 RB1 .5 CRCL 36 (SUTURE) IMPLANT
SUT SILK  1 MH (SUTURE) ×4
SUT SILK 1 MH (SUTURE) ×2 IMPLANT
SUT SILK 2 0SH CR/8 30 (SUTURE) ×2 IMPLANT
SUT SILK 3 0SH CR/8 30 (SUTURE) IMPLANT
SUT VIC AB 1 CTX 18 (SUTURE) ×6 IMPLANT
SUT VIC AB 1 CTX 36 (SUTURE) ×3
SUT VIC AB 1 CTX36XBRD ANBCTR (SUTURE) IMPLANT
SUT VIC AB 2 TP1 27 (SUTURE) ×2 IMPLANT
SUT VIC AB 2-0 CT2 18 VCP726D (SUTURE) IMPLANT
SUT VIC AB 2-0 CTX 36 (SUTURE) ×6 IMPLANT
SUT VIC AB 3-0 SH 18 (SUTURE) IMPLANT
SUT VIC AB 3-0 SH 8-18 (SUTURE) ×2 IMPLANT
SUT VIC AB 3-0 X1 27 (SUTURE) ×2 IMPLANT
SUT VICRYL 0 UR6 27IN ABS (SUTURE) IMPLANT
SUT VICRYL 2 TP 1 (SUTURE) IMPLANT
SWAB COLLECTION DEVICE MRSA (MISCELLANEOUS) IMPLANT
SYR BULB IRRIGATION 50ML (SYRINGE) ×2 IMPLANT
SYSTEM SAHARA CHEST DRAIN ATS (WOUND CARE) ×3 IMPLANT
TAPE CLOTH SURG 4X10 WHT LF (GAUZE/BANDAGES/DRESSINGS) ×3 IMPLANT
TIP APPLICATOR SPRAY EXTEND 16 (VASCULAR PRODUCTS) ×3 IMPLANT
TOWEL OR 17X24 6PK STRL BLUE (TOWEL DISPOSABLE) ×3 IMPLANT
TOWEL OR 17X26 10 PK STRL BLUE (TOWEL DISPOSABLE) ×6 IMPLANT
TRAP SPECIMEN MUCOUS 40CC (MISCELLANEOUS) IMPLANT
TRAY FOLEY CATH 16FRSI W/METER (SET/KITS/TRAYS/PACK) ×3 IMPLANT
TUBE ANAEROBIC SPECIMEN COL (MISCELLANEOUS) IMPLANT
WATER STERILE IRR 1000ML POUR (IV SOLUTION) ×6 IMPLANT

## 2015-04-20 NOTE — Progress Notes (Signed)
Went with Patient to O.R. On monitor and  O2 2 L/m N.C. With no complaints. Patient hooked up back to suction 20 cm wall suction.and 02 place on patient when she arrive to O.R. And place on monitor.

## 2015-04-20 NOTE — Progress Notes (Signed)
Patient ID: [name] [name], female   DOB: 1940/05/09, 75 y.o.   MRN: [id number]   SICU Evening Rounds:   Hemodynamically stable   Pain under control  Urine output good  CT output low, no air leak  CBC    Component Value Date/Time   WBC 8.9 04/15/2015 0515   RBC 3.10* 04/15/2015 0515   HGB 9.9* 04/20/2015 0948   HCT 29.0* 04/20/2015 0948   PLT 130* 04/15/2015 0515   MCV 94.2 04/15/2015 0515   MCH 31.0 04/15/2015 0515   MCHC 32.9 04/15/2015 0515   RDW 13.2 04/15/2015 0515   LYMPHSABS 1.9 04/06/2015 2136   MONOABS 0.8 04/06/2015 2136   EOSABS 0.1 04/06/2015 2136   BASOSABS 0.0 04/06/2015 2136     BMET    Component Value Date/Time   NA 135 04/20/2015 0948   K 4.0 04/20/2015 0948   CL 99* 04/15/2015 0515   CO2 28 04/15/2015 0515   GLUCOSE 150* 04/20/2015 0948   BUN 41* 04/15/2015 0515   CREATININE 1.27* 04/15/2015 0515   CALCIUM 9.3 04/15/2015 0515   GFRNONAA 40* 04/15/2015 0515   GFRAA 46* 04/15/2015 0515     A/P:  Stable postop course. Continue current plans

## 2015-04-20 NOTE — Brief Op Note (Signed)
       [address]       [place] [address]             [phone]     04/06/2015 - 04/20/2015  94:85 AM  PATIENT:  [name] [name]  75 y.o. female  PRE-OPERATIVE DIAGNOSIS:  PERSISTANT AIR LEAK  POST-OPERATIVE DIAGNOSIS:  PERSISTANT AIR LEAK  PROCEDURE:  Procedure(s): VIDEO ASSISTED THORACOSCOPY(LEFT) STAPLING OF BLEBS/LUL WEDGE RESECTIONS  SURGEON:  Surgeon(s): Ivin Poot, MD  PHYSICIAN ASSISTANT: Jahni Paul PA-C  ANESTHESIA:   general  SPECIMEN:  Source of Specimen:  LUL WEDGE RESECTIONS  DISPOSITION OF SPECIMEN:  Pathology  DRAINS: 70 F Chest Tube(s) in the LEFT HEMITHORAX   PATIENT CONDITION:  PACU - hemodynamically stable.  PRE-OPERATIVE WEIGHT: 46EV  COMPLICATIONS: NO KNOWN

## 2015-04-20 NOTE — Progress Notes (Signed)
Sister [name] [name] has all patient belongings includingall jewelry,  Glasses, and teeth

## 2015-04-20 NOTE — Anesthesia Procedure Notes (Signed)
Procedure Name: Intubation Date/Time: 04/20/2015 7:48 AM Performed by: Tressia Miners LEFFEW Pre-anesthesia Checklist: Patient identified, Patient being monitored, Timeout performed, Emergency Drugs available and Suction available Patient Re-evaluated:Patient Re-evaluated prior to inductionOxygen Delivery Method: Circle System Utilized Preoxygenation: Pre-oxygenation with 100% oxygen Intubation Type: IV induction Ventilation: Mask ventilation without difficulty and Oral airway inserted - appropriate to patient size Laryngoscope Size: Mac and 3 Grade View: Grade I Tube type: Oral Endobronchial tube: Double lumen EBT and Left and 37 Fr Number of attempts: 1 Airway Equipment and Method: Stylet Placement Confirmation: ETT inserted through vocal cords under direct vision,  positive ETCO2 and breath sounds checked- equal and bilateral Tube secured with: Tape Dental Injury: Teeth and Oropharynx as per pre-operative assessment

## 2015-04-20 NOTE — Transfer of Care (Signed)
Immediate Anesthesia Transfer of Care Note  Patient: [name] [name]  Procedure(s) Performed: Procedure(s): VIDEO ASSISTED THORACOSCOPY (Left) STAPLING OF BLEBS (Left)  Patient Location: PACU  Anesthesia Type:General  Level of Consciousness: awake, alert , patient cooperative and responds to stimulation  Airway & Oxygen Therapy: Patient Spontanous Breathing and Patient connected to nasal cannula oxygen  Post-op Assessment: Report given to RN, Post -op Vital signs reviewed and stable and Patient moving all extremities X 4  Post vital signs: Reviewed and stable  Last Vitals:  Filed Vitals:   04/19/15 1456 04/19/15 2245  BP: 107/57 90/45  Pulse: 95 70  Temp: 36.7 C 36.6 C  Resp: 17 18    Complications: No apparent anesthesia complications

## 2015-04-20 NOTE — Progress Notes (Signed)
Plan left VATS and bleb stapling, pleurodesis on A [place] The patient was examined and preop studies reviewed. There has been no change from the prior exam and the patient is ready for surgery.

## 2015-04-20 NOTE — Anesthesia Postprocedure Evaluation (Signed)
Anesthesia Post Note  Patient: [name] [name]  Procedure(s) Performed: Procedure(s) (LRB): VIDEO ASSISTED THORACOSCOPY (Left) STAPLING OF BLEBS (Left)  Patient location during evaluation: PACU Anesthesia Type: General Level of consciousness: awake and alert Pain management: pain level controlled Vital Signs Assessment: post-procedure vital signs reviewed and stable Respiratory status: spontaneous breathing Cardiovascular status: blood pressure returned to baseline Anesthetic complications: no    Last Vitals:  Filed Vitals:   04/20/15 1220 04/20/15 1230  BP:  140/60  Pulse:  79  Temp:    Resp: 23 21    Last Pain:  Filed Vitals:   04/20/15 1232  PainSc: Asleep                 Tiajuana Amass

## 2015-04-20 NOTE — Progress Notes (Signed)
Report call to Anesth.

## 2015-04-20 NOTE — Op Note (Signed)
NAME:  [name] [name], [name] [name] NO.:  [id number]  MEDICAL RECORD NO.:  [id number]  LOCATION:  2S09C                        FACILITY:  [facility]  PHYSICIAN:  Ivin Poot, M.D.  DATE OF BIRTH:  Jul 06, 1940  DATE OF PROCEDURE:  04/20/2015 DATE OF DISCHARGE:                              OPERATIVE REPORT   OPERATION: 1. Left VATS, mini-thoracotomy with stapling and over-sewing of blebs. 2. Talc pleurodesis.  SURGEON:  Ivin Poot, MD  ASSISTANT:  John Giovanni, PA-C.  PREOPERATIVE DIAGNOSES:  Large spontaneous pneumothorax with large persistent air leak, not improved with persistent chest tube drainage.  POSTOPERATIVE DIAGNOSES:  Large spontaneous pneumothorax with large persistent air leak, not improved with persistent chest tube drainage.  ANESTHESIA:  General by Soledad Gerlach, MD  CLINICAL NOTE:  The patient is a very nice 75 year old, Caucasian female, reformed smoker with COPD who also has metastatic pancreatic cancer with bilateral pulmonary artery METS, currently not receiving chemotherapy.  She developed a sudden shortness of breath and was found by x-ray to have a large pneumothorax.  She was transferred to this hospital and I placed a chest tube in the emergency department.  I reviewed her preoperative chest CT scan which showed emphysema with bullous disease as well as pulmonary METS from advanced age, pancreatic cancer.  Because of the chest tube, therapy did not resolve the air leak which remained very heavy and in fact, we were unable to remove suction from her Pleur-Evac without the lung collapsing and her becoming very symptomatic.  For that reason, surgical treatment for stapling of blebs and oversewing of blebs with pleurodesis was recommended.  I discussed the procedure in detail with the patient including the use of general anesthesia, the location of the surgical incisions, and the expected postoperative recovery.  I reviewed with her  the risks of the surgery including the risks of prolonged air leak, infection, ventilator dependence, infection, and death.  She understood these points and agreed to proceed with surgery under what I felt was an informed consent.  OPERATIVE FINDINGS: 1. Severe scarring of the pleural space from her previous left VATS     and pulmonary section as well as previous radiation therapy and her     previous COPD. 2. Several areas of blebs or possible metastatic disease were removed     with thin wedge resections.  One definite ruptured bleb was     oversewn and covered with biologic adhesive. 3. Blood transfusion was not required for this operation.  OPERATIVE PROCEDURE:  The patient was brought to the operating room, placed supine on the operating table where general anesthesia was induced under invasive hemodynamic monitoring.  A double-lumen endotracheal tube was positioned by the Anesthesia team.  The patient was turned to expose left side up.  The left chest tube previously placed was then removed.  The left chest was prepped and draped as a sterile field.  A proper time-out was performed.  I made a small incision in her previous lateral thoracotomy incision and entered the pleural space without difficulty.  However, the lung was extremely and densely adherent to the chest wall above the area on the incision.  We  put the scope in the chest and could not visualize the lung very well because of numerous previous dense thick adhesions.  I mobilized the lung as much as I could do safely without resulting more damage to the lung.  In the upper lobe, there were several areas of blebs or scarred area possibly involved with tumor, which I shaved off with the Endo-GIA 45 mm stapling loads.  These were covered with a fine layer of CoSeal.  After performing these wedge resections, we filled the left chest with warm saline and then the Anesthesia team ventilated the left lung. There were still  severe air leaks.  Because I felt it was not safe to mobilize the lung more superiorly through this incision, I made a small incision 2 interspaces above and there was an open space fortunately to further visualize the lung with the VATS camera.  Using the 2 incisions, I was then able to safely take down the lung off the chest wall to examine the entire upper lobe.  I found an area of severe leak from a bleb and this was in an area which could not be stable, but I oversewed it with some figure-of-eight chromic and covered with a biologic seal.  We then put water back in the chest, back the left lung, and there was significant improvement in air leak.  There still was some leak from the area of dissection on the upper lobe which I then oversewed with some chromic and as well as used some pledgeted Prolene sutures to reapproximate the pulmonary parenchyma and then covered this with biologic cohesive called ProGEL.  We then placed a red rubber Robinson catheter into the pleural space. We cut side holes, covered the 2 incisions and insufflated the chest with talc, 4-5 g.  Through this small incision for the red rubber Robinson catheter, a 40- French chest tube was positioned to the apex of the left chest and secured to the skin.  Next, I placed pericostal sutures of #2 Vicryl to reapproximate the ribs for the 2 incisions.  Before these were tied, the lung was re-expanded under direct vision and filled the space nicely.  The sutures were tied.  We then closed these 2 incisions using interrupted #1 Vicryl for the muscle layers and a running 2-0 Vicryl for the subcutaneous layer, and a running 3-0 Vicryl for the skin and subcuticular closure.  The previous chest tube site was debrided sharply, irrigated and closed in layers using interrupted 3-0 Vicryl for the subcutaneous layer and interrupted nylon for the skin.  The chest tube was then connected to the Pleur-Evac underwater seal  drainage.  Sterile dressings were applied.  The patient was then turned supine.  She was extubated by the Anesthesia team.  Assessment of her Pleur-Evac on suction after extubation showed minimal if any air leak.  The patient was then transferred to the ICU.     Ivin Poot, M.D.     PV/MEDQ  D:  04/20/2015  T:  04/20/2015  Job:  [id number]

## 2015-04-21 ENCOUNTER — Inpatient Hospital Stay (HOSPITAL_COMMUNITY): Payer: Medicare Other

## 2015-04-21 ENCOUNTER — Encounter (HOSPITAL_COMMUNITY): Payer: Self-pay | Admitting: Cardiothoracic Surgery

## 2015-04-21 LAB — BASIC METABOLIC PANEL
Anion gap: 11 (ref 5–15)
BUN: 19 mg/dL (ref 6–20)
CALCIUM: 8.8 mg/dL — AB (ref 8.9–10.3)
CHLORIDE: 100 mmol/L — AB (ref 101–111)
CO2: 24 mmol/L (ref 22–32)
Creatinine, Ser: 1.09 mg/dL — ABNORMAL HIGH (ref 0.44–1.00)
GFR calc Af Amer: 56 mL/min — ABNORMAL LOW (ref >60)
GFR calc non Af Amer: 48 mL/min — ABNORMAL LOW (ref >60)
GLUCOSE: 196 mg/dL — AB (ref 65–99)
Potassium: 4.2 mmol/L (ref 3.5–5.1)
Sodium: 135 mmol/L (ref 135–145)

## 2015-04-21 LAB — POCT I-STAT 3, ART BLOOD GAS (G3+)
Acid-Base Excess: 3 mmol/L — ABNORMAL HIGH (ref 0.0–2.0)
Bicarbonate: 27.9 mEq/L — ABNORMAL HIGH (ref 20.0–24.0)
O2 Saturation: 98 %
Patient temperature: 98.5
TCO2: 29 mmol/L (ref 0–100)
pCO2 arterial: 43.8 mmHg (ref 35.0–45.0)
pH, Arterial: 7.412 (ref 7.350–7.450)
pO2, Arterial: 107 mmHg — ABNORMAL HIGH (ref 80.0–100.0)

## 2015-04-21 LAB — CBC
HEMATOCRIT: 31.7 % — AB (ref 36.0–46.0)
HEMOGLOBIN: 10.1 g/dL — AB (ref 12.0–15.0)
MCH: 29.5 pg (ref 26.0–34.0)
MCHC: 31.9 g/dL (ref 30.0–36.0)
MCV: 92.7 fL (ref 78.0–100.0)
Platelets: 224 10*3/uL (ref 150–400)
RBC: 3.42 MIL/uL — ABNORMAL LOW (ref 3.87–5.11)
RDW: 12.7 % (ref 11.5–15.5)
WBC: 20.9 10*3/uL — AB (ref 4.0–10.5)

## 2015-04-21 MED ORDER — DEXTROSE-NACL 5-0.9 % IV SOLN: INTRAVENOUS | Status: DC

## 2015-04-21 MED ORDER — BISACODYL 5 MG PO TBEC: 5.0000 mg | DELAYED_RELEASE_TABLET | Freq: Every day | ORAL | Status: DC | PRN

## 2015-04-21 MED ORDER — DEXTROSE 5 % IV SOLN
1.5000 g | Freq: Two times a day (BID) | INTRAVENOUS | Status: DC
  Filled 2015-04-21 (×2): qty 1.5

## 2015-04-21 MED ORDER — FUROSEMIDE 10 MG/ML IJ SOLN
20.0000 mg | Freq: Every day | INTRAMUSCULAR | Status: AC
  Administered 2015-04-21 – 2015-04-22 (×2): 20 mg via INTRAVENOUS
  Filled 2015-04-21 (×2): qty 2

## 2015-04-21 MED ORDER — ENOXAPARIN SODIUM 40 MG/0.4ML ~~LOC~~ SOLN
40.0000 mg | SUBCUTANEOUS | Status: DC
  Administered 2015-04-21 – 2015-04-24 (×4): 40 mg via SUBCUTANEOUS
  Filled 2015-04-21 (×4): qty 0.4

## 2015-04-21 MED ORDER — DOCUSATE SODIUM 100 MG PO CAPS: 100.0000 mg | ORAL_CAPSULE | Freq: Two times a day (BID) | ORAL | Status: DC | PRN

## 2015-04-21 MED ORDER — DEXTROSE 5 % IV SOLN
1.5000 g | Freq: Two times a day (BID) | INTRAVENOUS | Status: AC
  Administered 2015-04-22 – 2015-04-26 (×10): 1.5 g via INTRAVENOUS
  Filled 2015-04-21 (×13): qty 1.5

## 2015-04-21 NOTE — Progress Notes (Signed)
1 Day Post-Op Procedure(s) (LRB): VIDEO ASSISTED THORACOSCOPY (Left) STAPLING OF BLEBS (Left) Subjective: Pain better this am after L VATS, bleb stapling and talc pleurodesis OOB to chair No air leak Will tx to stepdown  Objective: Vital signs in last 24 hours: Temp:  [97.8 F (36.6 C)-99.5 F (37.5 C)] 98.6 F (37 C) (02/28 0809) Pulse Rate:  [72-100] 99 (02/28 0800) Cardiac Rhythm:  [-] Normal sinus rhythm (02/28 0400) Resp:  [16-25] 21 (02/28 0800) BP: (119-149)/(48-71) 128/58 mmHg (02/28 0800) SpO2:  [94 %-99 %] 95 % (02/28 0800) Arterial Line BP: (87-193)/(47-72) 165/49 mmHg (02/28 0800) FiO2 (%):  [2 %] 2 % (02/27 1600) Weight:  [135 lb (61.236 kg)] 135 lb (61.236 kg) (02/28 0500)  Hemodynamic parameters for last 24 hours:  stable  Intake/Output from previous day: 02/27 0701 - 02/28 0700 In: 2735 [P.O.:150; I.V.:2535; IV Piggyback:50] Out: 9021 [Urine:1230; Blood:150; Chest Tube:290] Intake/Output this shift: Total I/O In: 150 [I.V.:100; IV Piggyback:50] Out: 75 [Urine:75]  Slight decreased BS L base nsr  Lab Results:  Recent Labs  04/20/15 0948 04/21/15 0420  WBC  --  20.9*  HGB 9.9* 10.1*  HCT 29.0* 31.7*  PLT  --  224   BMET:  Recent Labs  04/20/15 0948 04/21/15 0420  NA 135 135  K 4.0 4.2  CL  --  100*  CO2  --  24  GLUCOSE 150* 196*  BUN  --  19  CREATININE  --  1.09*  CALCIUM  --  8.8*    PT/INR:  Recent Labs  04/19/15 2005  LABPROT 14.3  INR 1.09   ABG    Component Value Date/Time   PHART 7.412 04/21/2015 0447   HCO3 27.9* 04/21/2015 0447   TCO2 29 04/21/2015 0447   ACIDBASEDEF 0.2 04/19/2015 1856   O2SAT 98.0 04/21/2015 0447   CBG (last 3)  No results for input(s): GLUCAP in the last 72 hours.  Assessment/Plan: S/P Procedure(s) (LRB): VIDEO ASSISTED THORACOSCOPY (Left) STAPLING OF BLEBS (Left) Mobilize Diuresis Plan for transfer to step-down: see transfer orders chest tube to suction   LOS: 15 days    Tharon Aquas Trigt III 04/21/2015

## 2015-04-21 NOTE — Progress Notes (Signed)
      [address]       [place] [address]             [phone]       Resting comfortably  BP 106/50 mmHg  Pulse 79  Temp(Src) 98.8 F (37.1 C) (Oral)  Resp 24  Ht '5\' 4"'$  (1.626 m)  Wt 135 lb (61.236 kg)  BMI 23.16 kg/m2  SpO2 92%   Intake/Output Summary (Last 24 hours) at 04/21/15 1732 Last data filed at 04/21/15 1500  Gross per 24 hour  Intake   1910 ml  Output   2155 ml  Net   -245 ml    Stable day  Continue current care  Micheala Morissette C. Roxan Hockey, MD [facility] [phone]

## 2015-04-21 NOTE — Progress Notes (Signed)
Patient slept most of the night well, dangled on the edge of the bed and sat up on a chair this morning. Patient has no pain or complaints. Will continue to monitor.

## 2015-04-21 NOTE — Progress Notes (Signed)
Pt transferred to 3S11 with belongings. Report given to receiving RN and all questions answered. VSS during transfer. Receiving RN at bedside.  Pt assisted to chair in new room. Family at bedside.

## 2015-04-21 NOTE — Care Management Note (Signed)
Case Management Note Marvetta Gibbons RN, BSN Unit 2W-Case Manager [phone]  Patient Details  Name: [name] [name] MRN: [id number] Date of Birth: 11-19-1940  Subjective/Objective:   Pt admitted with pntx- with chest tube placement  1 day s/p VATS with bleb stapling and pleurodesis, CT remains to suction               Action/Plan:  PTA pt lived at home alone, sister will stay with pt post discharge if needed - anticipate return home- may need [place] if has to go home with chest tube- CM to follow  Expected Discharge Date:                  Expected Discharge Plan:  [place]  In-House Referral:     Discharge planning Services  CM Consult  Post Acute Care Choice:    Choice offered to:     DME Arranged:    DME Agency:     HH Arranged:    [place] Agency:     Status of Service:  In process, will continue to follow  Medicare Important Message Given:  Yes Date Medicare IM Given:    Medicare IM give by:    Date Additional Medicare IM Given:    Additional Medicare Important Message give by:     If discussed at [place] of Stay Meetings, dates discussed:  04/14/15 04/16/15  04/21/15  Additional Comments:  Maryclare Labrador, RN 04/21/2015, 10:59 AM Case Management Note  Patient Details  Name: [name] [name] MRN: [id number] Date of Birth: [birth year]  Subjective/Objective:                    Action/Plan:   Expected Discharge Date:                  Expected Discharge Plan:  [place]  In-House Referral:     Discharge planning Services  CM Consult  Post Acute Care Choice:    Choice offered to:     DME Arranged:    DME Agency:     HH Arranged:    [place] Agency:     Status of Service:  In process, will continue to follow  Medicare Important Message Given:  Yes Date Medicare IM Given:    Medicare IM give by:    Date Additional Medicare IM Given:    Additional Medicare Important Message give by:     If discussed at [place] of  Stay Meetings, dates discussed:    Additional Comments:  Maryclare Labrador, RN 04/21/2015, 10:59 AM

## 2015-04-21 NOTE — Progress Notes (Signed)
UR Completed. Vinh Sachs, RN, BSN.  [phone] 

## 2015-04-21 NOTE — Care Management Important Message (Signed)
Important Message  Patient Details  Name: [name] [name] MRN: [id number] Date of Birth: 1940/03/19   Medicare Important Message Given:  Yes    Nathen May 04/21/2015, 3:24 PM

## 2015-04-22 ENCOUNTER — Inpatient Hospital Stay (HOSPITAL_COMMUNITY): Payer: Medicare Other

## 2015-04-22 LAB — GLUCOSE, CAPILLARY
Glucose-Capillary: 145 mg/dL — ABNORMAL HIGH (ref 65–99)
Glucose-Capillary: 192 mg/dL — ABNORMAL HIGH (ref 65–99)

## 2015-04-22 LAB — CBC
HEMATOCRIT: 27.2 % — AB (ref 36.0–46.0)
Hemoglobin: 8.7 g/dL — ABNORMAL LOW (ref 12.0–15.0)
MCH: 29.7 pg (ref 26.0–34.0)
MCHC: 32 g/dL (ref 30.0–36.0)
MCV: 92.8 fL (ref 78.0–100.0)
PLATELETS: 185 10*3/uL (ref 150–400)
RBC: 2.93 MIL/uL — ABNORMAL LOW (ref 3.87–5.11)
RDW: 12.7 % (ref 11.5–15.5)
WBC: 13.3 10*3/uL — AB (ref 4.0–10.5)

## 2015-04-22 LAB — COMPREHENSIVE METABOLIC PANEL
ALBUMIN: 1.8 g/dL — AB (ref 3.5–5.0)
ALT: 19 U/L (ref 14–54)
ANION GAP: 11 (ref 5–15)
AST: 20 U/L (ref 15–41)
Alkaline Phosphatase: 68 U/L (ref 38–126)
BUN: 15 mg/dL (ref 6–20)
CALCIUM: 8.7 mg/dL — AB (ref 8.9–10.3)
CO2: 26 mmol/L (ref 22–32)
Chloride: 99 mmol/L — ABNORMAL LOW (ref 101–111)
Creatinine, Ser: 1.01 mg/dL — ABNORMAL HIGH (ref 0.44–1.00)
GFR calc Af Amer: >60 mL/min (ref >60)
GFR calc non Af Amer: 53 mL/min — ABNORMAL LOW (ref >60)
GLUCOSE: 282 mg/dL — AB (ref 65–99)
Potassium: 3.2 mmol/L — ABNORMAL LOW (ref 3.5–5.1)
Sodium: 136 mmol/L (ref 135–145)
TOTAL PROTEIN: 4.9 g/dL — AB (ref 6.5–8.1)
Total Bilirubin: 0.5 mg/dL (ref 0.3–1.2)

## 2015-04-22 MED ORDER — INSULIN ASPART 100 UNIT/ML ~~LOC~~ SOLN: 0.0000 [IU] | Freq: Every day | SUBCUTANEOUS | Status: DC

## 2015-04-22 MED ORDER — INSULIN ASPART 100 UNIT/ML ~~LOC~~ SOLN
0.0000 [IU] | Freq: Three times a day (TID) | SUBCUTANEOUS | Status: DC
  Administered 2015-04-22 – 2015-04-24 (×5): 2 [IU] via SUBCUTANEOUS
  Administered 2015-04-24: 3 [IU] via SUBCUTANEOUS
  Administered 2015-04-25: 2 [IU] via SUBCUTANEOUS
  Administered 2015-04-26: 3 [IU] via SUBCUTANEOUS
  Administered 2015-04-26: 2 [IU] via SUBCUTANEOUS
  Administered 2015-04-27: 3 [IU] via SUBCUTANEOUS

## 2015-04-22 MED ORDER — AMIODARONE LOAD VIA INFUSION
150.0000 mg | Freq: Once | INTRAVENOUS | Status: AC
  Administered 2015-04-22: 150 mg via INTRAVENOUS
  Filled 2015-04-22: qty 83.34

## 2015-04-22 MED ORDER — AMIODARONE HCL IN DEXTROSE 360-4.14 MG/200ML-% IV SOLN
30.0000 mg/h | INTRAVENOUS | Status: DC
  Administered 2015-04-22 – 2015-04-24 (×4): 30 mg/h via INTRAVENOUS
  Filled 2015-04-22 (×4): qty 200

## 2015-04-22 MED ORDER — POTASSIUM CHLORIDE CRYS ER 20 MEQ PO TBCR
40.0000 meq | EXTENDED_RELEASE_TABLET | Freq: Once | ORAL | Status: AC
  Administered 2015-04-22: 40 meq via ORAL

## 2015-04-22 MED ORDER — INSULIN ASPART 100 UNIT/ML ~~LOC~~ SOLN
3.0000 [IU] | Freq: Three times a day (TID) | SUBCUTANEOUS | Status: DC
  Administered 2015-04-23 – 2015-04-28 (×6): 3 [IU] via SUBCUTANEOUS

## 2015-04-22 MED ORDER — AMIODARONE HCL IN DEXTROSE 360-4.14 MG/200ML-% IV SOLN
INTRAVENOUS | Status: AC
  Administered 2015-04-22: 60 mg/h via INTRAVENOUS
  Filled 2015-04-22: qty 200

## 2015-04-22 MED ORDER — FE FUMARATE-B12-VIT C-FA-IFC PO CAPS
1.0000 | ORAL_CAPSULE | Freq: Two times a day (BID) | ORAL | Status: DC
  Administered 2015-04-22 – 2015-04-28 (×13): 1 via ORAL
  Filled 2015-04-22 (×19): qty 1

## 2015-04-22 MED ORDER — SODIUM CHLORIDE 0.9% FLUSH
10.0000 mL | INTRAVENOUS | Status: DC | PRN
  Administered 2015-04-22 – 2015-04-28 (×2): 10 mL
  Filled 2015-04-22: qty 40

## 2015-04-22 MED ORDER — FE FUMARATE-B12-VIT C-FA-IFC PO CAPS
1.0000 | ORAL_CAPSULE | Freq: Every day | ORAL | Status: DC
  Filled 2015-04-22: qty 1

## 2015-04-22 MED ORDER — POTASSIUM CHLORIDE CRYS ER 20 MEQ PO TBCR
40.0000 meq | EXTENDED_RELEASE_TABLET | Freq: Once | ORAL | Status: AC
  Administered 2015-04-22: 40 meq via ORAL
  Filled 2015-04-22: qty 2

## 2015-04-22 MED ORDER — AMIODARONE HCL IN DEXTROSE 360-4.14 MG/200ML-% IV SOLN
60.0000 mg/h | INTRAVENOUS | Status: AC
  Administered 2015-04-22 (×2): 60 mg/h via INTRAVENOUS
  Filled 2015-04-22: qty 200

## 2015-04-22 MED ORDER — DILTIAZEM HCL 100 MG IV SOLR
5.0000 mg/h | INTRAVENOUS | Status: DC
  Administered 2015-04-22 – 2015-04-23 (×2): 5 mg/h via INTRAVENOUS
  Filled 2015-04-22 (×2): qty 100

## 2015-04-22 NOTE — Progress Notes (Signed)
Donielle PA order to remove foley but Dr. Nils Pyle reassessed and request to keep one more day.

## 2015-04-22 NOTE — Progress Notes (Signed)
Pt began to become tachy '@0230'$  and began having SVT and AFIB. Nurse notified by [facility] that patient had sustaining HR in the 150's. Nurse checked on pt who is asymptomatic with no abnormal symptoms. VSS were taken and BP was 70's over 40's, cycle was repeated twice. Pt was bolus once per standing orders BP elevated to 90's but HR was still high, pt was unable to sustain elevated BP. MD Roxan Hockey notified of pt change in condition and ordered Amiodarone protocol and to start on Amiodarone drip. Pt given Amiodarone bolus and currently on drip. Nurse will continue to monitor.

## 2015-04-22 NOTE — Progress Notes (Addendum)
      [address]       [place] [address]             [phone]       2 Days Post-Op Procedure(s) (LRB): VIDEO ASSISTED THORACOSCOPY (Left) STAPLING OF BLEBS (Left)  Subjective: Patient disappointed that she went into a fib.  Objective: Vital signs in last 24 hours: Temp:  [97.4 F (36.3 C)-99.4 F (37.4 C)] 97.9 F (36.6 C) (03/01 0410) Pulse Rate:  [40-150] 131 (03/01 0630) Cardiac Rhythm:  [-] Atrial fibrillation (03/01 0410) Resp:  [14-28] 20 (03/01 0630) BP: (77-141)/(43-92) 110/70 mmHg (03/01 0630) SpO2:  [91 %-98 %] 97 % (03/01 0630) Arterial Line BP: (160)/(47) 160/47 mmHg (02/28 0900)      Intake/Output from previous day: 02/28 0701 - 03/01 0700 In: 1684.3 [I.V.:1584.3; IV Piggyback:100] Out: 4709 [KHVFM:7340; Chest Tube:410]   Physical Exam:  Cardiovascular: IRRR IRRR Pulmonary: Slightly diminished at left base but otherwise clear. Abdomen: Soft, non tender, bowel sounds present. Chest Tube: to suction, no air leak  Lab Results: CBC:  Recent Labs  04/21/15 0420 04/22/15 0403  WBC 20.9* 13.3*  HGB 10.1* 8.7*  HCT 31.7* 27.2*  PLT 224 185   BMET:   Recent Labs  04/21/15 0420 04/22/15 0403  NA 135 136  K 4.2 3.2*  CL 100* 99*  CO2 24 26  GLUCOSE 196* 282*  BUN 19 15  CREATININE 1.09* 1.01*  CALCIUM 8.8* 8.7*    PT/INR:   Recent Labs  04/19/15 2005  LABPROT 14.3  INR 1.09   ABG:  INR: Will add last result for INR, ABG once components are confirmed Will add last 4 CBG results once components are confirmed  Assessment/Plan:  1. CV - Went into a fib with RVR. On Amiodarone and Cardizem drips and Lopressor 25 mg daily. 2.  Pulmonary - Chest tube with 410 cc of output last 24 hours. Chest tube is to suction. There is no air leak. CXR this am appears stable (no pneumothorax). Will decrease suction. Encourage incentive spirometer 3. Anemia-last H and H this am is down to 8.7 and 27.2. Start Trinsicon 4.  Supplement potassium  ZIMMERMAN,DONIELLE MPA-C 04/22/2015,8:08 AM   Reduce CT suction 10 cm  Cont iv amio, cardizem for postop Afib Leave foley another day Leave Lovenox at 40 daily for now with bloody chest tube drainage Path on wedge resections pending patient examined and medical record reviewed,agree with above note. Tharon Aquas Trigt III 04/22/2015

## 2015-04-23 ENCOUNTER — Inpatient Hospital Stay (HOSPITAL_COMMUNITY): Payer: Medicare Other

## 2015-04-23 ENCOUNTER — Encounter (HOSPITAL_COMMUNITY): Payer: Self-pay

## 2015-04-23 LAB — GLUCOSE, CAPILLARY
Glucose-Capillary: 129 mg/dL — ABNORMAL HIGH (ref 65–99)
Glucose-Capillary: 139 mg/dL — ABNORMAL HIGH (ref 65–99)
Glucose-Capillary: 146 mg/dL — ABNORMAL HIGH (ref 65–99)
Glucose-Capillary: 127 mg/dL — ABNORMAL HIGH (ref 65–99)
Glucose-Capillary: 116 mg/dL — ABNORMAL HIGH (ref 65–99)
Glucose-Capillary: 153 mg/dL — ABNORMAL HIGH (ref 65–99)

## 2015-04-23 LAB — BASIC METABOLIC PANEL
Anion gap: 13 (ref 5–15)
BUN: 12 mg/dL (ref 6–20)
CHLORIDE: 93 mmol/L — AB (ref 101–111)
CO2: 24 mmol/L (ref 22–32)
CREATININE: 0.99 mg/dL (ref 0.44–1.00)
Calcium: 7.9 mg/dL — ABNORMAL LOW (ref 8.9–10.3)
GFR calc Af Amer: >60 mL/min (ref >60)
GFR calc non Af Amer: 54 mL/min — ABNORMAL LOW (ref >60)
GLUCOSE: 482 mg/dL — AB (ref 65–99)
Potassium: 3.3 mmol/L — ABNORMAL LOW (ref 3.5–5.1)
Sodium: 130 mmol/L — ABNORMAL LOW (ref 135–145)

## 2015-04-23 LAB — TYPE AND SCREEN
ABO/RH(D): A POS
Antibody Screen: NEGATIVE
Unit division: 0
Unit division: 0

## 2015-04-23 MED ORDER — DEXTROSE 5 % IV SOLN
1.5000 g | INTRAVENOUS | Status: DC | PRN
  Administered 2015-04-20: 1.5 g via INTRAVENOUS

## 2015-04-23 MED ORDER — INSULIN GLARGINE 100 UNIT/ML ~~LOC~~ SOLN
15.0000 [IU] | Freq: Two times a day (BID) | SUBCUTANEOUS | Status: DC
  Administered 2015-04-23 – 2015-04-28 (×11): 15 [IU] via SUBCUTANEOUS
  Filled 2015-04-23 (×12): qty 0.15

## 2015-04-23 MED ORDER — POTASSIUM CHLORIDE CRYS ER 20 MEQ PO TBCR
20.0000 meq | EXTENDED_RELEASE_TABLET | Freq: Three times a day (TID) | ORAL | Status: DC
  Administered 2015-04-23 – 2015-04-27 (×13): 20 meq via ORAL
  Filled 2015-04-23 (×13): qty 1

## 2015-04-23 NOTE — Addendum Note (Signed)
Addendum  created 04/23/15 0953 by Glynda Jaeger, CRNA   Modules edited: Anesthesia Medication Administration

## 2015-04-23 NOTE — Progress Notes (Addendum)
[address]       [place] [address]             [phone]     3 Days Post-Op Procedure(s) (LRB): VIDEO ASSISTED THORACOSCOPY (Left) STAPLING OF BLEBS (Left)  Total Length of Stay:  LOS: 17 days   Subjective: No air leak, feeling better, now in sinus rhythm  Objective: Vital signs in last 24 hours: Temp:  [97.3 F (36.3 C)-99.1 F (37.3 C)] 97.3 F (36.3 C) (03/02 0735) Pulse Rate:  [71-123] 71 (03/02 0413) Cardiac Rhythm:  [-] Normal sinus rhythm (03/02 0700) Resp:  [11-25] 19 (03/02 0413) BP: (103-123)/(46-64) 123/57 mmHg (03/02 0413) SpO2:  [94 %-99 %] 97 % (03/02 0413)  Filed Weights   04/19/15 0431 04/20/15 0523 04/21/15 0500  Weight: 139 lb 11.2 oz (63.368 kg) 134 lb 11.2 oz (61.1 kg) 135 lb (61.236 kg)    Weight change:    Hemodynamic parameters for last 24 hours:    Intake/Output from previous day: 03/01 0701 - 03/02 0700 In: 2806.3 [P.O.:960; I.V.:1796.3; IV Piggyback:50] Out: 2256 [Urine:1875; Stool:1; Chest Tube:380]  Intake/Output this shift:    Current Meds: Scheduled Meds: . acetaminophen  1,000 mg Oral 4 times per day   Or  . acetaminophen (TYLENOL) oral liquid 160 mg/5 mL  1,000 mg Oral 4 times per day  . bisacodyl  10 mg Oral Daily  . cefUROXime (ZINACEF)  IV  1.5 g Intravenous Q12H  . cholecalciferol  1,000 Units Oral Daily  . enoxaparin (LOVENOX) injection  40 mg Subcutaneous Q24H  . feeding supplement  237 mL Oral TID WC  . feeding supplement (ENSURE ENLIVE)  237 mL Oral BID BM  . fentaNYL   Intravenous 6 times per day  . ferrous [id number] C-folic acid  1 capsule Oral BID  . insulin aspart  0-15 Units Subcutaneous TID WC  . insulin aspart  0-5 Units Subcutaneous QHS  . insulin aspart  3 Units Subcutaneous TID WC  . insulin glargine  15 Units Subcutaneous BID  . lipase/protease/amylase  36,000 Units Oral Q breakfast  . metoprolol tartrate  25 mg Oral q morning - 10a  . omeprazole  20 mg Oral BID  AC  . potassium chloride  20 mEq Oral TID  . senna-docusate  1 tablet Oral QHS   Continuous Infusions: . amiodarone 30 mg/hr (04/23/15 0313)  . dextrose 5 % and 0.9% NaCl 40 mL/hr at 04/22/15 0600   PRN Meds:.albuterol, alum & mag hydroxide-simeth, bisacodyl, diphenhydrAMINE **OR** diphenhydrAMINE, docusate sodium, naloxone **AND** sodium chloride flush, ondansetron (ZOFRAN) IV, oxyCODONE, sodium chloride flush, sorbitol, traMADol  General appearance: alert, cooperative and no distress Heart: regular rate and rhythm Lungs: clear to auscultation bilaterally Abdomen: benign Extremities: warrm, well perfused Wound: dressings with minor drainage  Lab Results: CBC: Recent Labs  04/21/15 0420 04/22/15 0403  WBC 20.9* 13.3*  HGB 10.1* 8.7*  HCT 31.7* 27.2*  PLT 224 185   BMET:  Recent Labs  04/22/15 0403 04/23/15 0427  NA 136 130*  K 3.2* 3.3*  CL 99* 93*  CO2 26 24  GLUCOSE 282* 482*  BUN 15 12  CREATININE 1.01* 0.99  CALCIUM 8.7* 7.9*    PT/INR: No results for input(s): LABPROT, INR in the last 72 hours. Radiology: No results found.   Assessment/Plan: S/P Procedure(s) (LRB): VIDEO ASSISTED THORACOSCOPY (Left) STAPLING OF BLEBS (Left)  1 stable 2 d/c cardizem, plan to change amio to po tomorrow 3 replace  K+  4 CT to H20 seal 5 leukocytosis improved 6 H/H stable 7 final path pending   GOLD,WAYNE E 04/23/2015 8:10 AM  Heart rhythm stable nsr Chest tube to water seal Path shows no evidence of pancreatic cancer nut with atypical mesothelial cells- slides to be sent out for consult Overall patient appears better patient examined and medical record reviewed,agree with above note. Tharon Aquas Trigt III 04/23/2015

## 2015-04-24 ENCOUNTER — Inpatient Hospital Stay (HOSPITAL_COMMUNITY): Payer: Medicare Other

## 2015-04-24 LAB — GLUCOSE, CAPILLARY
Glucose-Capillary: 99 mg/dL (ref 65–99)
Glucose-Capillary: 196 mg/dL — ABNORMAL HIGH (ref 65–99)
Glucose-Capillary: 144 mg/dL — ABNORMAL HIGH (ref 65–99)
Glucose-Capillary: 154 mg/dL — ABNORMAL HIGH (ref 65–99)

## 2015-04-24 LAB — CBC
HCT: 25.4 % — ABNORMAL LOW (ref 36.0–46.0)
Hemoglobin: 8.1 g/dL — ABNORMAL LOW (ref 12.0–15.0)
MCH: 29.3 pg (ref 26.0–34.0)
MCHC: 31.9 g/dL (ref 30.0–36.0)
MCV: 92 fL (ref 78.0–100.0)
Platelets: 185 10*3/uL (ref 150–400)
RBC: 2.76 MIL/uL — ABNORMAL LOW (ref 3.87–5.11)
RDW: 12.8 % (ref 11.5–15.5)
WBC: 10.8 10*3/uL — ABNORMAL HIGH (ref 4.0–10.5)

## 2015-04-24 LAB — BASIC METABOLIC PANEL
Anion gap: 10 (ref 5–15)
BUN: 14 mg/dL (ref 6–20)
CO2: 25 mmol/L (ref 22–32)
Calcium: 8.8 mg/dL — ABNORMAL LOW (ref 8.9–10.3)
Chloride: 103 mmol/L (ref 101–111)
Creatinine, Ser: 0.95 mg/dL (ref 0.44–1.00)
GFR calc Af Amer: >60 mL/min (ref >60)
GFR calc non Af Amer: 57 mL/min — ABNORMAL LOW (ref >60)
Glucose, Bld: 89 mg/dL (ref 65–99)
Potassium: 3.7 mmol/L (ref 3.5–5.1)
Sodium: 138 mmol/L (ref 135–145)

## 2015-04-24 MED ORDER — ENOXAPARIN SODIUM 100 MG/ML ~~LOC~~ SOLN
90.0000 mg | SUBCUTANEOUS | Status: DC
  Administered 2015-04-25 – 2015-04-28 (×4): 90 mg via SUBCUTANEOUS
  Filled 2015-04-24 (×5): qty 1

## 2015-04-24 MED ORDER — SODIUM CHLORIDE 0.9 % IV SOLN
INTRAVENOUS | Status: DC
  Administered 2015-04-24: 10 mL/h via INTRAVENOUS

## 2015-04-24 MED ORDER — AMIODARONE HCL 200 MG PO TABS
200.0000 mg | ORAL_TABLET | Freq: Two times a day (BID) | ORAL | Status: DC
  Administered 2015-04-24 – 2015-04-28 (×9): 200 mg via ORAL
  Filled 2015-04-24 (×9): qty 1

## 2015-04-24 NOTE — Care Management Important Message (Signed)
Important Message  Patient Details  Name: [name] [name] MRN: [id number] Date of Birth: [birth year]   Medicare Important Message Given:  Yes    Nathen May 04/24/2015, 11:44 AM

## 2015-04-24 NOTE — Progress Notes (Addendum)
[address]       [place] [address]             [phone]      4 Days Post-Op Procedure(s) (LRB): VIDEO ASSISTED THORACOSCOPY (Left) STAPLING OF BLEBS (Left) Subjective: Maintaining sinus rhythm, Now has fairly large air leak  Objective: Vital signs in last 24 hours: Temp:  [97.5 F (36.4 C)-98.2 F (36.8 C)] 97.8 F (36.6 C) (03/03 0744) Pulse Rate:  [69-86] 71 (03/03 0744) Cardiac Rhythm:  [-] Normal sinus rhythm (03/03 0433) Resp:  [17-21] 19 (03/03 0744) BP: (117-138)/(52-67) 119/60 mmHg (03/03 0744) SpO2:  [94 %-99 %] 95 % (03/03 0744)  Hemodynamic parameters for last 24 hours:    Intake/Output from previous day: 03/02 0701 - 03/03 0700 In: 1740.4 [P.O.:480; I.V.:1160.4; IV Piggyback:100] Out: 1000 [Urine:900; Chest Tube:100] Intake/Output this shift:    General appearance: alert, cooperative and no distress Heart: regular rate and rhythm Lungs: dim in left bade Abdomen: benign Extremities: no edema Wound: dressings dry/intact  Lab Results:  Recent Labs  04/22/15 0403 04/24/15 0430  WBC 13.3* 10.8*  HGB 8.7* 8.1*  HCT 27.2* 25.4*  PLT 185 185   BMET:  Recent Labs  04/23/15 0427 04/24/15 0430  NA 130* 138  K 3.3* 3.7  CL 93* 103  CO2 24 25  GLUCOSE 482* 89  BUN 12 14  CREATININE 0.99 0.95  CALCIUM 7.9* 8.8*    PT/INR: No results for input(s): LABPROT, INR in the last 72 hours. ABG    Component Value Date/Time   PHART 7.412 04/21/2015 0447   HCO3 27.9* 04/21/2015 0447   TCO2 29 04/21/2015 0447   ACIDBASEDEF 0.2 04/19/2015 1856   O2SAT 98.0 04/21/2015 0447   CBG (last 3)   Recent Labs  04/23/15 1232 04/23/15 1713 04/23/15 2135  GLUCAP 127* 116* 153*    Meds Scheduled Meds: . acetaminophen  1,000 mg Oral 4 times per day   Or  . acetaminophen (TYLENOL) oral liquid 160 mg/5 mL  1,000 mg Oral 4 times per day  . bisacodyl  10 mg Oral Daily  . cefUROXime (ZINACEF)  IV  1.5 g Intravenous Q12H  .  cholecalciferol  1,000 Units Oral Daily  . enoxaparin (LOVENOX) injection  40 mg Subcutaneous Q24H  . feeding supplement  237 mL Oral TID WC  . feeding supplement (ENSURE ENLIVE)  237 mL Oral BID BM  . fentaNYL   Intravenous 6 times per day  . ferrous [id number] C-folic acid  1 capsule Oral BID  . insulin aspart  0-15 Units Subcutaneous TID WC  . insulin aspart  0-5 Units Subcutaneous QHS  . insulin aspart  3 Units Subcutaneous TID WC  . insulin glargine  15 Units Subcutaneous BID  . lipase/protease/amylase  36,000 Units Oral Q breakfast  . metoprolol tartrate  25 mg Oral q morning - 10a  . omeprazole  20 mg Oral BID AC  . potassium chloride  20 mEq Oral TID  . senna-docusate  1 tablet Oral QHS   Continuous Infusions: . amiodarone 30 mg/hr (04/24/15 0323)  . dextrose 5 % and 0.9% NaCl 40 mL/hr at 04/22/15 0600   PRN Meds:.albuterol, alum & mag hydroxide-simeth, bisacodyl, diphenhydrAMINE **OR** diphenhydrAMINE, docusate sodium, naloxone **AND** sodium chloride flush, ondansetron (ZOFRAN) IV, oxyCODONE, sodium chloride flush, sorbitol, traMADol  Xrays Dg Chest Port 1 View  04/23/2015  CLINICAL DATA:  Left pneumothorax follow-up ; lung malignancy and cervical malignancy. EXAM: PORTABLE CHEST  1 VIEW COMPARISON:  Portable chest x-ray of April 22, 2015 FINDINGS: The lungs are well-expanded. No recurrent left-sided pneumothorax is observed. The tip of the left chest tube overlies the posterior aspect of the left third rib. There is pleural thickening or loculated pleural fluid peripherally in the left mid and lower lung. Coarse retrocardiac lung markings on the left persists. The right lung is well expanded and grossly clear. The heart is top-normal in size. The pulmonary vascularity is not engorged. The Port-A-Cath appliance tip projects over the midportion of the SVC. IMPRESSION: There has not been significant interval change in the appearance chest since yesterday's study. No recurrent  pneumothorax. Electronically Signed   By: David  Martinique M.D.   On: 04/23/2015 08:12    Assessment/Plan: S/P Procedure(s) (LRB): VIDEO ASSISTED THORACOSCOPY (Left) STAPLING OF BLEBS (Left)  1 cont CT to H2O seal , appears expanded but has air leak 2 convert to po amio 3 labs ok, H/H down a bit more 4 sugars adeq control    LOS: 18 days    GOLD,WAYNE E 04/24/2015  Patient examined and has a new airleak  4 days postop after she  strangled and coughed violently last pm Her lung remains up after pleurodesis Will probably need transition to miniexpress as long as lung remains up and poss DC with HHN next week  patient examined and medical record reviewed,agree with above note. Tharon Aquas Trigt III 04/24/2015

## 2015-04-24 NOTE — Care Management Note (Signed)
Case Management Note  Patient Details  Name: ROGINA SCHIANO MRN: [id number] Date of Birth: [birth year]  Subjective/Objective:       Patient lives at home alone, s/p vats, still has a chest tube to water seal, has a leakage,  NCM will continue to monitor for dc needs.             Action/Plan:   Expected Discharge Date:                  Expected Discharge Plan:  Home/Self Care  In-House Referral:     Discharge planning Services  CM Consult  Post Acute Care Choice:    Choice offered to:     DME Arranged:    DME Agency:     HH Arranged:    HH Agency:     Status of Service:  In process, will continue to follow  Medicare Important Message Given:  Yes Date Medicare IM Given:    Medicare IM give by:    Date Additional Medicare IM Given:    Additional Medicare Important Message give by:     If discussed at [place] of Stay Meetings, dates discussed:    Additional Comments:  Zenon Mayo, RN 04/24/2015, 4:05 PM

## 2015-04-25 ENCOUNTER — Inpatient Hospital Stay (HOSPITAL_COMMUNITY): Payer: Medicare Other

## 2015-04-25 LAB — CBC
HCT: 25.8 % — ABNORMAL LOW (ref 36.0–46.0)
Hemoglobin: 8.2 g/dL — ABNORMAL LOW (ref 12.0–15.0)
MCH: 29.5 pg (ref 26.0–34.0)
MCHC: 31.8 g/dL (ref 30.0–36.0)
MCV: 92.8 fL (ref 78.0–100.0)
Platelets: 207 10*3/uL (ref 150–400)
RBC: 2.78 MIL/uL — ABNORMAL LOW (ref 3.87–5.11)
RDW: 12.9 % (ref 11.5–15.5)
WBC: 10.5 10*3/uL (ref 4.0–10.5)

## 2015-04-25 LAB — GLUCOSE, CAPILLARY
Glucose-Capillary: 87 mg/dL (ref 65–99)
Glucose-Capillary: 140 mg/dL — ABNORMAL HIGH (ref 65–99)
Glucose-Capillary: 137 mg/dL — ABNORMAL HIGH (ref 65–99)
Glucose-Capillary: 153 mg/dL — ABNORMAL HIGH (ref 65–99)

## 2015-04-25 NOTE — Progress Notes (Addendum)
      [address]       [place] [address]             [phone]       5 Days Post-Op Procedure(s) (LRB): VIDEO ASSISTED THORACOSCOPY (Left) STAPLING OF BLEBS (Left)  Subjective: Patient sitting in chair. No specific complaints.  Objective: Vital signs in last 24 hours: Temp:  [97.7 F (36.5 C)-98.5 F (36.9 C)] 98 F (36.7 C) (03/04 0800) Pulse Rate:  [63-82] 68 (03/04 0354) Cardiac Rhythm:  [-] Normal sinus rhythm (03/04 0700) Resp:  [15-25] 20 (03/04 0849) BP: (113-130)/(46-69) 123/55 mmHg (03/04 0800) SpO2:  [91 %-100 %] 96 % (03/04 0849)     Intake/Output from previous day: 03/03 0701 - 03/04 0700 In: 1480.7 [P.O.:1200; I.V.:180.7; IV Piggyback:100] Out: 545 [Urine:425; Chest Tube:120]   Physical Exam:  Cardiovascular: RRR Pulmonary: Slightly diminished at left base but otherwise clear. Abdomen: Soft, non tender, bowel sounds present. Wounds: Clean and dry. No signs of infection. Chest Tube: to water seal, air leak that worsens with cough. Draining pleural fluid around chest tube site.  Lab Results: CBC:  Recent Labs  04/24/15 0430 04/25/15 0359  WBC 10.8* 10.5  HGB 8.1* 8.2*  HCT 25.4* 25.8*  PLT 185 207   BMET:   Recent Labs  04/23/15 0427 04/24/15 0430  NA 130* 138  K 3.3* 3.7  CL 93* 103  CO2 24 25  GLUCOSE 482* 89  BUN 12 14  CREATININE 0.99 0.95  CALCIUM 7.9* 8.8*    PT/INR:  No results for input(s): LABPROT, INR in the last 72 hours. ABG:  INR: Will add last result for INR, ABG once components are confirmed Will add last 4 CBG results once components are confirmed  Assessment/Plan:  1. CV - Previous a  fib with RVR. SR this am. On Amiodarone 200 mg bid and Lopressor 25 mg daily. 2.  Pulmonary - Chest tube with 120 cc of output last 24 hours. Chest tube is to water seal. There is an intermittent air leak that worsens with cough. CXR this am shows patient is rotated to the right, left pneumothorax.  Encourage incentive spirometer. If able to continue chest tube to water seal, may be able to transition to a mini express soon. Check CXR in am 3. Anemia-last H and H this am is stable at 8.2 and 25.8. Continue Trinsicon   ZIMMERMAN,DONIELLE MPA-C 04/25/2015,11:01 AM     Chart reviewed, patient examined, agree with above. Still has air leak with cough. If CXR looks ok in the am may try to convert to mini-express.

## 2015-04-26 ENCOUNTER — Inpatient Hospital Stay (HOSPITAL_COMMUNITY): Payer: Medicare Other

## 2015-04-26 LAB — GLUCOSE, CAPILLARY
Glucose-Capillary: 100 mg/dL — ABNORMAL HIGH (ref 65–99)
Glucose-Capillary: 199 mg/dL — ABNORMAL HIGH (ref 65–99)
Glucose-Capillary: 144 mg/dL — ABNORMAL HIGH (ref 65–99)
Glucose-Capillary: 189 mg/dL — ABNORMAL HIGH (ref 65–99)
Glucose-Capillary: 130 mg/dL — ABNORMAL HIGH (ref 65–99)

## 2015-04-26 NOTE — Progress Notes (Addendum)
      [address]       [place] [address]             [phone]       6 Days Post-Op Procedure(s) (LRB): VIDEO ASSISTED THORACOSCOPY (Left) STAPLING OF BLEBS (Left)  Subjective: Patient sitting in chair. She is draining a fair amount from left chest tube site.  Objective: Vital signs in last 24 hours: Temp:  [97.6 F (36.4 C)-98.3 F (36.8 C)] 97.6 F (36.4 C) (03/05 0708) Pulse Rate:  [69-76] 74 (03/05 0708) Cardiac Rhythm:  [-] Normal sinus rhythm (03/05 0700) Resp:  [20-25] 23 (03/05 0708) BP: (115-129)/(44-54) 127/54 mmHg (03/05 0708) SpO2:  [95 %-100 %] 95 % (03/05 0708)     Intake/Output from previous day: 03/04 0701 - 03/05 0700 In: 250 [P.O.:120; I.V.:130] Out: 25 [Chest Tube:25]   Physical Exam:  Cardiovascular: RRR Pulmonary: Slightly diminished at left base but otherwise clear. Abdomen: Soft, non tender, bowel sounds present. Wounds: Clean and dry. No signs of infection. Chest Tube: to water seal, air leak that worsens with cough. Draining pleural fluid around chest tube site. There is sero sanguinous drainage from left chest tube site  Lab Results: CBC:  Recent Labs  04/24/15 0430 04/25/15 0359  WBC 10.8* 10.5  HGB 8.1* 8.2*  HCT 25.4* 25.8*  PLT 185 207   BMET:   Recent Labs  04/24/15 0430  NA 138  K 3.7  CL 103  CO2 25  GLUCOSE 89  BUN 14  CREATININE 0.95  CALCIUM 8.8*    PT/INR:  No results for input(s): LABPROT, INR in the last 72 hours. ABG:  INR: Will add last result for INR, ABG once components are confirmed Will add last 4 CBG results once components are confirmed  Assessment/Plan:  1. CV - Previous a  fib with RVR. SR this am. On Amiodarone 200 mg bid and Lopressor 25 mg daily. 2.  Pulmonary - Chest tube with 25 cc of output last 24 hours. Chest tube is to water seal. There is an intermittent air leak that worsens with cough. CXR this am is stable. Encourage incentive spirometer.Hope to change  to mini express in am. Check CXR in am 3. Anemia-last H and H this am is stable at 8.2 and 25.8. Continue Trinsicon   ZIMMERMAN,DONIELLE MPA-C 04/26/2015,10:06 AM     Chart reviewed, patient examined, agree with above. CXR stable. There is a small ptx laterally towards the base that is stable. Small air leak persists with cough. Plan mini-express tomorrow.

## 2015-04-27 ENCOUNTER — Inpatient Hospital Stay (HOSPITAL_COMMUNITY): Payer: Medicare Other

## 2015-04-27 LAB — GLUCOSE, CAPILLARY
Glucose-Capillary: 107 mg/dL — ABNORMAL HIGH (ref 65–99)
Glucose-Capillary: 79 mg/dL (ref 65–99)
Glucose-Capillary: 196 mg/dL — ABNORMAL HIGH (ref 65–99)
Glucose-Capillary: 75 mg/dL (ref 65–99)
Glucose-Capillary: 99 mg/dL (ref 65–99)

## 2015-04-27 MED ORDER — FUROSEMIDE 20 MG PO TABS
20.0000 mg | ORAL_TABLET | Freq: Every day | ORAL | Status: DC
  Administered 2015-04-28: 20 mg via ORAL
  Filled 2015-04-27: qty 1

## 2015-04-27 MED ORDER — FUROSEMIDE 10 MG/ML IJ SOLN
20.0000 mg | Freq: Once | INTRAMUSCULAR | Status: AC
  Administered 2015-04-27: 20 mg via INTRAVENOUS
  Filled 2015-04-27: qty 2

## 2015-04-27 MED ORDER — POTASSIUM CHLORIDE CRYS ER 20 MEQ PO TBCR
20.0000 meq | EXTENDED_RELEASE_TABLET | Freq: Every day | ORAL | Status: DC
  Administered 2015-04-28: 20 meq via ORAL
  Filled 2015-04-27: qty 1

## 2015-04-27 NOTE — Care Management Note (Signed)
Case Management Note  Patient Details  Name: AMANDA STEUART MRN: [id number] Date of Birth: 10/03/1940  Subjective/Objective:    NCM spoke with patient, she lives alone but her sister will be with her until she gets better , she will not be alone.  NCM gave patient list of [place] agencies to choose from, she would like to look over list for about 40 minutes and talk to one of her family members who work in snf and then she sill let me know which agency she will choose.  Patient chose [facility], referral made to [place], Soc will begin 24-48 hrs post dc.               Action/Plan:   Expected Discharge Date:                  Expected Discharge Plan:  [place]  In-House Referral:     Discharge planning Services  CM Consult  Post Acute Care Choice:  Home Health Choice offered to:  Patient  DME Arranged:    DME Agency:     HH Arranged:   [place] Agency:   [facility]  Status of Service:  In process, will continue to follow  Medicare Important Message Given:  Yes Date Medicare IM Given:    Medicare IM give by:    Date Additional Medicare IM Given:    Additional Medicare Important Message give by:     If discussed at [place] of Stay Meetings, dates discussed:    Additional Comments:  Zenon Mayo, RN 04/27/2015, 11:20 AM

## 2015-04-27 NOTE — Progress Notes (Addendum)
      [name] [name]       [name] [name],[place] [address]             [phone]      7 Days Post-Op Procedure(s) (LRB): VIDEO ASSISTED THORACOSCOPY (Left) STAPLING OF BLEBS (Left)   Subjective:  [name] [name] has no complaints.  Excited to go home soon.  Objective: Vital signs in last 24 hours: Temp:  [97.6 F (36.4 C)-98.4 F (36.9 C)] 97.6 F (36.4 C) (03/06 0456) Pulse Rate:  [67-70] 70 (03/06 0453) Cardiac Rhythm:  [-] Normal sinus rhythm (03/06 0749) Resp:  [20-25] 24 (03/06 0630) BP: (125-131)/(48-51) 125/48 mmHg (03/06 0453) SpO2:  [97 %-100 %] 97 % (03/06 0630)  Intake/Output from previous day: 03/05 0701 - 03/06 0700 In: 240 [P.O.:240] Out: -   General appearance: alert, cooperative and no distress Heart: regular rate and rhythm Lungs: clear to auscultation bilaterally Abdomen: soft, non-tender; bowel sounds normal; no masses,  no organomegaly Wound: clean and dry  Lab Results:  Recent Labs  04/25/15 0359  WBC 10.5  HGB 8.2*  HCT 25.8*  PLT 207   BMET: No results for input(s): NA, K, CL, CO2, GLUCOSE, BUN, CREATININE, CALCIUM in the last 72 hours.  PT/INR: No results for input(s): LABPROT, INR in the last 72 hours. ABG    Component Value Date/Time   PHART 7.412 04/21/2015 0447   HCO3 27.9* 04/21/2015 0447   TCO2 29 04/21/2015 0447   ACIDBASEDEF 0.2 04/19/2015 1856   O2SAT 98.0 04/21/2015 0447   CBG (last 3)   Recent Labs  04/26/15 2235 04/27/15 0638 04/27/15 0816  GLUCAP 130* 107* 79    Assessment/Plan: S/P Procedure(s) (LRB): VIDEO ASSISTED THORACOSCOPY (Left) STAPLING OF BLEBS (Left)  1. Chest tube- connected MINI Express on water seal, + air leak- will get 2V CXR in AM- if remains stable or improves will plan to discharge home 1-2 days    LOS: 21 days    [name] [name] 04/27/2015  Her sister will be staying with her at home [facility] for chest tube care Daily lasix 20 mg at home Office appt  W/ CXR  Next Wednesday patient  examined and medical record reviewed,agree with above note. Tharon Aquas Trigt III 04/27/2015

## 2015-04-27 NOTE — Discharge Summary (Signed)
Physician Discharge Summary  Patient ID: CLYDENE BURACK MRN: [id number] DOB/AGE: Jul 20, 1940 75 y.o.  Admit date: 04/06/2015 Discharge date: 04/28/2015  Admission Diagnoses:  Patient Active Problem List   Diagnosis Date Noted  . Pneumothorax, left 04/06/2015  . Cervical cancer ([place])   . Emphysema   . Hypertension   . Lung cancer ([place]) 06/11/2010  . Cancer ([place]) 08/21/2008   Discharge Diagnoses:   Patient Active Problem List   Diagnosis Date Noted  . Pneumothorax, left 04/06/2015  . Cervical cancer ([place])   . Emphysema   . Hypertension   . Lung cancer ([place]) 06/11/2010  . Cancer ([place]) 08/21/2008   Discharged Condition: good  History of Present Illness:  Ms. Barrick is a 75 yo white female with known history of metastatic pancreatic cancer with several pulmonary mets.  She has undergone Left VATS in the past for resection of nodule due to radiation changes.  She presented to an OSH with complaints of shortness of breath of acute onset.  CXR was obtained and showed a large pneumothorax on the left.  It was felt chest tube placement would be indicated and she was transferred to [facility] for further care.  Upon arrival to the ED the patient was stable on 2L of oxygen.  Dr. Prescott Gum performed a bedside chest tube insertion.  She tolerated the procedure without difficulty and was admitted for further care.       Hospital Course:   Ms. Bisig was stable throughout her hospital stay.  However, her stay was complicated by persistent air leak in her chest tube.  She was trialed on water seal despite air leak, but ultimately her pneumothorax would increase.  She underwent bedside Talc Pleurodesis without relief of air leak.  It was ultimately decided she would require VATS procedure.  She was taken to the operating room and underwent Left VATS with Mini Thoracotomy on 04/20/2015.  She underwent stapling and over sewing of blebs and repeat Talc pleurodesis.  She tolerated the  procedure without difficulty, was extubated and taken to the PACU in stable condition.  She developed SVT and Atrial Fibrillation post operatively.  She was treated with IV Amiodarone with successful conversion to NSR.  She continued to exhibit an air leak post operatively.  However, her CXR showed minimal pneumothorax and she was transitioned to water seal.  She tolerated this without difficulty with stable appearance of her pneumothorax on CXR.  She has been transitioned to a Mini Express.  Her air leak remains present, but again her CXR in stable in appearance.  Her pain is well controlled.  She has been weaned off oxygen.  She is ambulating without difficulty.  She is felt medically stable for discharge today.    Treatments: surgery:  1. Left VATS, mini-thoracotomy with stapling and over-sewing of blebs. 2. Talc pleurodesis.  Disposition: 01-Home or Self Care   Discharge Medications:     Medication List    STOP taking these medications        amoxicillin-clavulanate 875-125 MG tablet  Commonly known as:  AUGMENTIN     hydrochlorothiazide 25 MG tablet  Commonly known as:  HYDRODIURIL     losartan 100 MG tablet  Commonly known as:  COZAAR      TAKE these medications        acetaminophen 325 MG tablet  Commonly known as:  TYLENOL  Take 650 mg by mouth every 6 (six) hours as needed for moderate pain.  amiodarone 200 MG tablet  Commonly known as:  PACERONE  Take 1 tablet (200 mg total) by mouth 2 (two) times daily.     enoxaparin 100 MG/ML injection  Commonly known as:  LOVENOX  Inject 100 mg into the skin daily.     ferrous [id number] C-folic acid capsule  Commonly known as:  TRINSICON / FOLTRIN  Take 1 capsule by mouth 2 (two) times daily at 10 AM and 5 PM.     furosemide 20 MG tablet  Commonly known as:  LASIX  Take 1 tablet (20 mg total) by mouth daily.     lipase/protease/amylase 36000 UNITS Cpep capsule  Commonly known as:  CREON  Take 36,000 Units  by mouth 3 (three) times daily with meals.     metoprolol succinate 50 MG 24 hr tablet  Commonly known as:  TOPROL-XL  Take 25 mg by mouth daily before breakfast. Take with or immediately following a meal.     omeprazole 20 MG capsule  Commonly known as:  PRILOSEC  Take 20 mg by mouth daily.     potassium chloride SA 20 MEQ tablet  Commonly known as:  K-DUR,KLOR-CON  Take 1 tablet (20 mEq total) by mouth daily.     traMADol 50 MG tablet  Commonly known as:  ULTRAM  Take 1-2 tablets (50-100 mg total) by mouth every 6 (six) hours as needed (mild pain).       Follow-up Information    Follow up with [facility].   Why:  HHRN for chest tube care   Contact information:   [address] [place]  [address] [phone]       Follow up with Ivin Poot III, MD On 05/06/2015.   Specialty:  Cardiothoracic Surgery   Why:  Appointment is at 12:45   Contact information:   [address] [place] [place] [address] [phone]       Follow up with [facility] On 05/06/2015.   Why:  Please get CXR at 12:15   Contact information:   [facility]       Signed: Ellwood Handler 04/28/2015, 8:45 AM

## 2015-04-28 ENCOUNTER — Inpatient Hospital Stay (HOSPITAL_COMMUNITY): Payer: Medicare Other

## 2015-04-28 LAB — GLUCOSE, CAPILLARY: Glucose-Capillary: 85 mg/dL (ref 65–99)

## 2015-04-28 MED ORDER — FUROSEMIDE 20 MG PO TABS: 20.0000 mg | ORAL_TABLET | Freq: Every day | ORAL | Status: DC

## 2015-04-28 MED ORDER — AMIODARONE HCL 200 MG PO TABS: 200.0000 mg | ORAL_TABLET | Freq: Two times a day (BID) | ORAL | Status: DC

## 2015-04-28 MED ORDER — HEPARIN SOD (PORK) LOCK FLUSH 100 UNIT/ML IV SOLN
500.0000 [IU] | INTRAVENOUS | Status: AC | PRN
  Administered 2015-04-28: 500 [IU]

## 2015-04-28 MED ORDER — POTASSIUM CHLORIDE CRYS ER 20 MEQ PO TBCR: 20.0000 meq | EXTENDED_RELEASE_TABLET | Freq: Every day | ORAL | Status: DC

## 2015-04-28 MED ORDER — TRAMADOL HCL 50 MG PO TABS: 50.0000 mg | ORAL_TABLET | Freq: Four times a day (QID) | ORAL | Status: DC | PRN

## 2015-04-28 MED ORDER — FE FUMARATE-B12-VIT C-FA-IFC PO CAPS: 1.0000 | ORAL_CAPSULE | Freq: Two times a day (BID) | ORAL | Status: DC

## 2015-04-28 NOTE — Care Management Important Message (Signed)
Important Message  Patient Details  Name: [name] [name] MRN: [id number] Date of Birth: November 27, 1940   Medicare Important Message Given:  Yes    Loann Quill 04/28/2015, 8:33 AM

## 2015-04-28 NOTE — Progress Notes (Signed)
      [address]       [place] [address]             [phone]      8 Days Post-Op Procedure(s) (LRB): VIDEO ASSISTED THORACOSCOPY (Left) STAPLING OF BLEBS (Left)   Subjective:  [name] [name] continues to feel well.  She is very happy to be going home.   Objective: Vital signs in last 24 hours: Temp:  [97.9 F (36.6 C)-98.3 F (36.8 C)] 98.3 F (36.8 C) (03/07 0800) Pulse Rate:  [62-73] 73 (03/07 0805) Cardiac Rhythm:  [-] Normal sinus rhythm (03/07 0745) Resp:  [18-23] 18 (03/07 0805) BP: (99-135)/(48-92) 135/57 mmHg (03/07 0805) SpO2:  [97 %-99 %] 98 % (03/07 0805)  Intake/Output from previous day: 03/06 0701 - 03/07 0700 In: 240 [P.O.:240] Out: 500 [Urine:500]  General appearance: alert, cooperative and no distress Heart: regular rate and rhythm Lungs: clear to auscultation bilaterally Abdomen: soft, non-tender; bowel sounds normal; no masses,  no organomegaly Wound: clean andd ry  Lab Results: No results for input(s): WBC, HGB, HCT, PLT in the last 72 hours. BMET: No results for input(s): NA, K, CL, CO2, GLUCOSE, BUN, CREATININE, CALCIUM in the last 72 hours.  PT/INR: No results for input(s): LABPROT, INR in the last 72 hours. ABG    Component Value Date/Time   PHART 7.412 04/21/2015 0447   HCO3 27.9* 04/21/2015 0447   TCO2 29 04/21/2015 0447   ACIDBASEDEF 0.2 04/19/2015 1856   O2SAT 98.0 04/21/2015 0447   CBG (last 3)   Recent Labs  04/27/15 1722 04/27/15 2119 04/28/15 0755  GLUCAP 75 99 85    Assessment/Plan: S/P Procedure(s) (LRB): VIDEO ASSISTED THORACOSCOPY (Left) STAPLING OF BLEBS (Left)  1. Chest tube- + air leak with cough, improving- CXR shows improvement of pneumothorax 2. CV- maintaining NSR, continue Amiodarone, lopressor 3. Dispo- patient stable, H/H arranged, will d/c home today   LOS: 22 days    [name] [name] 04/28/2015

## 2015-04-28 NOTE — Discharge Instructions (Signed)
1. No Driving 2. Resume Regular Diet 3. Ambulate three times per day 4. May sponge bath 5. Activity as tolerated   Pneumothorax A pneumothorax, commonly called a collapsed lung, is a condition in which air leaks from a lung and builds up in the space between the lung and the chest wall (pleural space). The air in a pneumothorax is trapped outside the lung and takes up space, preventing the lung from fully expanding. This is a condition that usually occurs suddenly. The buildup of air may be small or large. A small pneumothorax may go away on its own. When a pneumothorax is larger, it will often require medical treatment and hospitalization.  CAUSES  A pneumothorax can sometimes happen quickly with no apparent cause. People with underlying lung problems, particularly COPD or emphysema, are at higher risk of pneumothorax. However, pneumothorax can happen quickly even in people with no prior known lung problems. Trauma, surgery, medical procedures, or injury to the chest wall can also cause a pneumothorax. SIGNS AND SYMPTOMS  Sometimes a pneumothorax will have no symptoms. When symptoms are present, they can include:  Chest pain.  Shortness of breath.  Increased rate of breathing.  Bluish color to your lips or skin (cyanosis). DIAGNOSIS  Pneumothorax is usually diagnosed by a chest X-ray or chest CT scan. Your health care provider will also take a medical history and perform a physical exam to determine why you may have a pneumothorax. TREATMENT  A small pneumothorax may go away on its own without treatment. Extra oxygen can sometimes help a small pneumothorax go away more quickly. For a larger pneumothorax or a pneumothorax that is causing symptoms, a procedure is usually needed to drain the air.In some cases, the health care provider may drain the air using a needle. In other cases, a chest tube may be inserted into the pleural space. A chest tube is a small tube placed between the ribs and  into the pleural space. This removes the extra air and allows the lung to expand back to its normal size. A large pneumothorax will usually require a hospital stay. If there is ongoing air leakage into the pleural space, then the chest tube may need to remain in place for several days until the air leak has healed. In some cases, surgery may be needed.  HOME CARE INSTRUCTIONS   Only take over-the-counter or prescription medicines as directed by your health care provider.  If a cough or pain makes it difficult for you to sleep at night, try sleeping in a semi-upright position in a recliner or by using 2 or 3 pillows.  Rest and limit activity as directed by your health care provider.  If you had a chest tube and it was removed, ask your health care provider when it is okay to remove the dressing. Until your health care provider says you can remove the dressing, do not allow it to get wet.  Do not smoke. Smoking is a risk factor for pneumothorax.  Do not fly in an airplane or scuba dive until your health care provider says it is okay.  Follow up with your health care provider as directed. SEEK IMMEDIATE MEDICAL CARE IF:   You have increasing chest pain or shortness of breath.  You have a cough that is not controlled with suppressants.  You begin coughing up blood.  You have pain that is getting worse or is not controlled with medicines.  You cough up thick, discolored mucus (sputum) that is yellow to  green in color.  You have redness, increasing pain, or discharge at the site where a chest tube had been in place (if your pneumothorax was treated with a chest tube).  The site where your chest tube was located opens up.  You feel air coming out of the site where the chest tube was placed.  You have a fever or persistent symptoms for more than 2-3 days.  You have a fever and your symptoms suddenly get worse. MAKE SURE YOU:   Understand these instructions.  Will watch your  condition.  Will get help right away if you are not doing well or get worse.   This information is not intended to replace advice given to you by your health care provider. Make sure you discuss any questions you have with your health care provider.   Document Released: 02/07/2005 Document Revised: 11/28/2012 Document Reviewed: 09/06/2012 Elsevier Interactive Patient Education 2016 [place] Resection, Care After Refer to this sheet in the next few weeks. These instructions provide you with information on caring for yourself after your procedure. Your health care provider may also give you more specific instructions. Your treatment has been planned according to current medical practices, but problems sometimes occur. Call your health care provider if you have any problems or questions after your procedure. WHAT TO EXPECT AFTER THE PROCEDURE After your procedure, it is typical to have the following:   You may feel pain in your chest and throat.  Patients may sometimes shiver or feel nauseous during recovery. HOME CARE INSTRUCTIONS  You may resume a normal diet and activities as directed by your health care provider.  Do not use any tobacco products, including cigarettes, chewing tobacco, or electronic cigarettes. If you need help quitting, ask your health care provider.  There are many different ways to close and cover an incision, including stitches, skin glue, and adhesive strips. Follow your health care provider's instructions on:  Incision care.  Bandage (dressing) changes and removal.  Incision closure removal.  Take medicines only as directed by your health care provider.  Keep all follow-up visits as directed by your health care provider. This is important.  Try to breathe deeply and cough as directed. Holding a pillow firmly over your ribs may help with discomfort.  If you were given an incentive spirometer in the hospital, continue to use it as directed by your  health care provider.  Walk as directed by your health care provider.  You may take a shower and gently wash the area of your incision with water and soap as directed by your health care provider. Do not use anything else to clean your incision except as directed by your health care provider. Do not take baths, swim, or use a hot tub until your health care provider approves. SEEK MEDICAL CARE IF:  You notice redness, swelling, or increasing pain at the incision site.  You are bleeding at the incision site.  You see pus coming from the incision site.  You notice a bad smell coming from the incision site or bandage.  Your incision breaks open.  You cough up blood or pus, or you develop a cough that produces bad-smelling sputum.  You have pain or swelling in your legs.  You have increasing pain that is not controlled with medicine.  You have trouble managing any of the tubes that have been left in place after surgery.  You have fever or chills. SEEK IMMEDIATE MEDICAL CARE IF:   You have  chest pain or an irregular or rapid heartbeat.  You have dizzy episodes or faint.  You have shortness of breath or difficulty breathing.  You have persistent nausea or vomiting.  You have a rash.   This information is not intended to replace advice given to you by your health care provider. Make sure you discuss any questions you have with your health care provider.   Document Released: 08/27/2004 Document Revised: 02/28/2014 Document Reviewed: 03/29/2013 Elsevier Interactive Patient Education [employer].

## 2015-04-28 NOTE — Care Management Note (Signed)
Case Management Note  Patient Details  Name: [name] [name] MRN: [id number] Date of Birth: 30-Aug-1940  Subjective/Objective: Patient for dc today with mini express chest tube, NCM notified [name] [name] with [facility].                     Action/Plan:   Expected Discharge Date:                  Expected Discharge Plan:  [place]  In-House Referral:     Discharge planning Services  CM Consult  Post Acute Care Choice:  Home Health Choice offered to:  Patient  DME Arranged:    DME Agency:     HH Arranged:  RN [place] Agency:  [facility]  Status of Service:  Completed, signed off  Medicare Important Message Given:  Yes Date Medicare IM Given:    Medicare IM give by:    Date Additional Medicare IM Given:    Additional Medicare Important Message give by:     If discussed at [place] of Stay Meetings, dates discussed:    Additional Comments:  Zenon Mayo, RN 04/28/2015, 10:33 AM

## 2015-04-28 NOTE — Progress Notes (Signed)
Discharge instructions given to patient and family, all questions answered at this time.  Prescriptions reviewed and given to patient, pt. Verbalized understanding.  Pt. VSS with no s/s of distress noted.  Patient stable at discharge.

## 2015-04-29 DIAGNOSIS — Z48813 Encounter for surgical aftercare following surgery on the respiratory system: Secondary | ICD-10-CM | POA: Diagnosis not present

## 2015-04-29 DIAGNOSIS — J439 Emphysema, unspecified: Secondary | ICD-10-CM | POA: Diagnosis not present

## 2015-04-29 DIAGNOSIS — J9383 Other pneumothorax: Secondary | ICD-10-CM | POA: Diagnosis not present

## 2015-04-29 DIAGNOSIS — I10 Essential (primary) hypertension: Secondary | ICD-10-CM | POA: Diagnosis not present

## 2015-04-29 DIAGNOSIS — Z8507 Personal history of malignant neoplasm of pancreas: Secondary | ICD-10-CM | POA: Diagnosis not present

## 2015-04-29 DIAGNOSIS — Z8541 Personal history of malignant neoplasm of cervix uteri: Secondary | ICD-10-CM | POA: Diagnosis not present

## 2015-04-29 DIAGNOSIS — Z85118 Personal history of other malignant neoplasm of bronchus and lung: Secondary | ICD-10-CM | POA: Diagnosis not present

## 2015-04-29 DIAGNOSIS — Z4803 Encounter for change or removal of drains: Secondary | ICD-10-CM | POA: Diagnosis not present

## 2015-04-30 ENCOUNTER — Encounter (HOSPITAL_COMMUNITY): Payer: Self-pay

## 2015-05-01 DIAGNOSIS — I10 Essential (primary) hypertension: Secondary | ICD-10-CM | POA: Diagnosis not present

## 2015-05-01 DIAGNOSIS — J9383 Other pneumothorax: Secondary | ICD-10-CM | POA: Diagnosis not present

## 2015-05-01 DIAGNOSIS — Z8507 Personal history of malignant neoplasm of pancreas: Secondary | ICD-10-CM | POA: Diagnosis not present

## 2015-05-01 DIAGNOSIS — J439 Emphysema, unspecified: Secondary | ICD-10-CM | POA: Diagnosis not present

## 2015-05-01 DIAGNOSIS — Z48813 Encounter for surgical aftercare following surgery on the respiratory system: Secondary | ICD-10-CM | POA: Diagnosis not present

## 2015-05-01 DIAGNOSIS — Z8541 Personal history of malignant neoplasm of cervix uteri: Secondary | ICD-10-CM | POA: Diagnosis not present

## 2015-05-04 DIAGNOSIS — Z8507 Personal history of malignant neoplasm of pancreas: Secondary | ICD-10-CM | POA: Diagnosis not present

## 2015-05-04 DIAGNOSIS — Z48813 Encounter for surgical aftercare following surgery on the respiratory system: Secondary | ICD-10-CM | POA: Diagnosis not present

## 2015-05-04 DIAGNOSIS — J9383 Other pneumothorax: Secondary | ICD-10-CM | POA: Diagnosis not present

## 2015-05-04 DIAGNOSIS — Z8541 Personal history of malignant neoplasm of cervix uteri: Secondary | ICD-10-CM | POA: Diagnosis not present

## 2015-05-04 DIAGNOSIS — I10 Essential (primary) hypertension: Secondary | ICD-10-CM | POA: Diagnosis not present

## 2015-05-04 DIAGNOSIS — J439 Emphysema, unspecified: Secondary | ICD-10-CM | POA: Diagnosis not present

## 2015-05-05 ENCOUNTER — Other Ambulatory Visit: Payer: Self-pay | Admitting: Cardiothoracic Surgery

## 2015-05-05 DIAGNOSIS — J939 Pneumothorax, unspecified: Secondary | ICD-10-CM

## 2015-05-06 ENCOUNTER — Ambulatory Visit: Payer: Self-pay | Admitting: Cardiothoracic Surgery

## 2015-05-06 DIAGNOSIS — J439 Emphysema, unspecified: Secondary | ICD-10-CM | POA: Diagnosis not present

## 2015-05-06 DIAGNOSIS — I10 Essential (primary) hypertension: Secondary | ICD-10-CM | POA: Diagnosis not present

## 2015-05-06 DIAGNOSIS — Z48813 Encounter for surgical aftercare following surgery on the respiratory system: Secondary | ICD-10-CM | POA: Diagnosis not present

## 2015-05-06 DIAGNOSIS — J9383 Other pneumothorax: Secondary | ICD-10-CM | POA: Diagnosis not present

## 2015-05-06 DIAGNOSIS — Z8541 Personal history of malignant neoplasm of cervix uteri: Secondary | ICD-10-CM | POA: Diagnosis not present

## 2015-05-06 DIAGNOSIS — Z8507 Personal history of malignant neoplasm of pancreas: Secondary | ICD-10-CM | POA: Diagnosis not present

## 2015-05-11 DIAGNOSIS — I10 Essential (primary) hypertension: Secondary | ICD-10-CM | POA: Diagnosis not present

## 2015-05-11 DIAGNOSIS — J439 Emphysema, unspecified: Secondary | ICD-10-CM | POA: Diagnosis not present

## 2015-05-11 DIAGNOSIS — J9383 Other pneumothorax: Secondary | ICD-10-CM | POA: Diagnosis not present

## 2015-05-11 DIAGNOSIS — Z8541 Personal history of malignant neoplasm of cervix uteri: Secondary | ICD-10-CM | POA: Diagnosis not present

## 2015-05-11 DIAGNOSIS — Z48813 Encounter for surgical aftercare following surgery on the respiratory system: Secondary | ICD-10-CM | POA: Diagnosis not present

## 2015-05-11 DIAGNOSIS — Z8507 Personal history of malignant neoplasm of pancreas: Secondary | ICD-10-CM | POA: Diagnosis not present

## 2015-05-12 ENCOUNTER — Ambulatory Visit: Payer: Self-pay | Admitting: Cardiothoracic Surgery

## 2015-05-13 ENCOUNTER — Encounter: Payer: Self-pay | Admitting: Cardiothoracic Surgery

## 2015-05-13 ENCOUNTER — Ambulatory Visit
Admission: RE | Admit: 2015-05-13 | Discharge: 2015-05-13 | Disposition: A | Discharge status: Home / Self Care | Payer: Medicare Other | Source: Ambulatory Visit | Attending: Cardiothoracic Surgery | Admitting: Cardiothoracic Surgery

## 2015-05-13 ENCOUNTER — Ambulatory Visit (INDEPENDENT_AMBULATORY_CARE_PROVIDER_SITE_OTHER): Payer: Self-pay | Admitting: Cardiothoracic Surgery

## 2015-05-13 VITALS — BP 135/76 | HR 90 | Resp 16 | Ht 64.5 in | Wt 127.4 lb

## 2015-05-13 DIAGNOSIS — R0602 Shortness of breath: Secondary | ICD-10-CM | POA: Diagnosis not present

## 2015-05-13 DIAGNOSIS — Z09 Encounter for follow-up examination after completed treatment for conditions other than malignant neoplasm: Secondary | ICD-10-CM

## 2015-05-13 DIAGNOSIS — J9382 Other air leak: Secondary | ICD-10-CM

## 2015-05-13 DIAGNOSIS — J939 Pneumothorax, unspecified: Secondary | ICD-10-CM

## 2015-05-13 DIAGNOSIS — C3412 Malignant neoplasm of upper lobe, left bronchus or lung: Secondary | ICD-10-CM

## 2015-05-13 DIAGNOSIS — IMO0002 Reserved for concepts with insufficient information to code with codable children: Secondary | ICD-10-CM

## 2015-05-13 NOTE — Progress Notes (Signed)
PCP is SHAH,ASHISH, MD Referring Provider is  Patient returns for routine followup after left VATS with wedge resection of blebs for persistent spontaneous pneumothorax. Patient has past history of pancreatic cancer status post Whipple and chemotherapy. The pathology on the wedge resections came back positive for weight is a probable pancreatic cancer. The patient had a persistent airleak without pneumothorax and was sent home with her chest tube. Today the chest x-ray shows no pneumothorax, chest tube in good position . The mini express shows the persistence of a small air leak. The chest tube was not removed. The surgical incisions are clean dry the chest tube site is without evidence of infection a clean dressing was placed. Patient is breathing comfortably on room air with O2 saturation 96%.    Past Medical History  Diagnosis Date  . Cancer [facility]) July 2010    Pancreatic adenocarcinoma  . Cervical cancer ([place]) 1963  . Emphysema   . Hypertension   . Hypercholesterolemia   . COPD (chronic obstructive pulmonary disease) ([place])   . Abdominal wall hernia   . Aortic aneurysm ([facility])     small aortic aneurysm  . Cataract 2010    b/l surgery    Past Surgical History  Procedure Laterality Date  . Vaginal hysterectomy  1963    Cervical Cancer  . Tonsillectomy  1943  . Whipple procedure  July 2010    pancreatic adenocarcinoma  . Cholecystectomy  July 2010    done w/ Whipple procedure  . Breast surgery  1988    Right breast abcess  . Bunionectomy  2007  . Eye surgery  2009    Bilateral cataract  . Video assisted thoracoscopy Left 04/20/2015    Procedure: VIDEO ASSISTED THORACOSCOPY;  Surgeon: Ivin Poot, MD;  Location: [facility];  Service: Thoracic;  Laterality: Left;  . Stapling of blebs Left 04/20/2015    Procedure: STAPLING OF BLEBS;  Surgeon: Ivin Poot, MD;  Location: [facility] OR;  Service: Thoracic;  Laterality: Left;    Family History  Problem Relation Age of Onset  .  Pancreatic cancer Brother   . Colon cancer Sister 60    12/2006 dx surgery and chemotherapy  . Ovarian cancer Sister 62    Ovarian ca,  surgery and chemotherapy    Social History Social History  Substance Use Topics  . Smoking status: Former Smoker -- 1.00 packs/day for 30 years    Types: Cigarettes    Quit date: 02/29/1996  . Smokeless tobacco: Never Used  . Alcohol Use: No    Current Outpatient Prescriptions  Medication Sig Dispense Refill  . acetaminophen (TYLENOL) 325 MG tablet Take 650 mg by mouth every 6 (six) hours as needed for moderate pain.     Marland Kitchen amiodarone (PACERONE) 200 MG tablet Take 1 tablet (200 mg total) by mouth 2 (two) times daily. 60 tablet 3  . enoxaparin (LOVENOX) 100 MG/ML injection Inject 100 mg into the skin daily.    . ferrous [id number] C-folic acid (TRINSICON / FOLTRIN) capsule Take 1 capsule by mouth 2 (two) times daily at 10 AM and 5 PM. 60 capsule 0  . furosemide (LASIX) 20 MG tablet Take 1 tablet (20 mg total) by mouth daily. 30 tablet 3  . lipase/protease/amylase (CREON) 36000 UNITS CPEP capsule Take 36,000 Units by mouth 3 (three) times daily with meals.    . metoprolol succinate (TOPROL-XL) 50 MG 24 hr tablet Take 25 mg by mouth daily before breakfast. Take with or immediately following a  meal.    . omeprazole (PRILOSEC) 20 MG capsule Take 20 mg by mouth daily.     . potassium chloride SA (K-DUR,KLOR-CON) 20 MEQ tablet Take 1 tablet (20 mEq total) by mouth daily. 30 tablet 3  . traMADol (ULTRAM) 50 MG tablet Take 1-2 tablets (50-100 mg total) by mouth every 6 (six) hours as needed (mild pain). (Patient not taking: Reported on 05/13/2015) 30 tablet 0   No current facility-administered medications for this visit.    Allergies  Allergen Reactions  . Oxaliplatin Anaphylaxis  . Aspirin Other (See Comments)    Burns stomach    Review of Systems   Poor appetite Not requiring any narcotics for postthoracotomy pain  BP 135/76 mmHg  Pulse  90  Resp 16  Ht 5' 4.5" (1.638 m)  Wt 127 lb 6.4 oz (57.788 kg)  BMI 21.54 kg/m2  SpO2 97% Physical Exam Alert and comfortable Breath sounds clear VATS incision is clean and dry Heart rate regular Minimal serous drainage from chest tube with evidence of positive airleak in mini express indicator  Diagnostic Tests: Chest x-ray today personally reviewed  Impression: Evidence recurrent pancreatic cancer with pulmonary metastases-pathology report will be faxed to patient's oncologist Dr. Woodroe Chen We will leave chest tube in place until airleak resolves Plan: Continue home health nursing services for chest tube care Return in 3 weeks with chest x-ray to assess airleak and removal of chest tube  Len Childs, MD [facility] [phone]

## 2015-05-20 DIAGNOSIS — I10 Essential (primary) hypertension: Secondary | ICD-10-CM | POA: Diagnosis not present

## 2015-05-20 DIAGNOSIS — Z48813 Encounter for surgical aftercare following surgery on the respiratory system: Secondary | ICD-10-CM | POA: Diagnosis not present

## 2015-05-20 DIAGNOSIS — J439 Emphysema, unspecified: Secondary | ICD-10-CM | POA: Diagnosis not present

## 2015-05-20 DIAGNOSIS — Z8507 Personal history of malignant neoplasm of pancreas: Secondary | ICD-10-CM | POA: Diagnosis not present

## 2015-05-20 DIAGNOSIS — Z8541 Personal history of malignant neoplasm of cervix uteri: Secondary | ICD-10-CM | POA: Diagnosis not present

## 2015-05-20 DIAGNOSIS — J9383 Other pneumothorax: Secondary | ICD-10-CM | POA: Diagnosis not present

## 2015-05-26 ENCOUNTER — Telehealth: Payer: Self-pay | Admitting: *Deleted

## 2015-05-26 DIAGNOSIS — J939 Pneumothorax, unspecified: Secondary | ICD-10-CM | POA: Diagnosis not present

## 2015-05-26 DIAGNOSIS — J9382 Other air leak: Secondary | ICD-10-CM | POA: Diagnosis not present

## 2015-05-26 DIAGNOSIS — C259 Malignant neoplasm of pancreas, unspecified: Secondary | ICD-10-CM | POA: Diagnosis not present

## 2015-05-26 DIAGNOSIS — Z9689 Presence of other specified functional implants: Secondary | ICD-10-CM | POA: Diagnosis not present

## 2015-05-26 DIAGNOSIS — R918 Other nonspecific abnormal finding of lung field: Secondary | ICD-10-CM | POA: Diagnosis not present

## 2015-05-26 NOTE — Telephone Encounter (Signed)
[name] [name] had called Dr. Roxy Manns who was on call because her chest tube/mini express had fallen out this morning. He said to call the office at 9 and make arrangements to be seen today.  I called Dr Darcey Nora in the [address] and relayed this occurrence. He ordered a CXR @ [facility] and be notified of the reading. Of note, [name] [name] is feeling well, no respiratory distress. Her sister was told by the [facility] that if this should ever happen, put a glob of vaseline on the hole and cover with a tight dressing which she has done.  The xray was done, no pneumothorax, and results called to Dr. Prescott Gum. He would like to see her in the office this week with a f/u CXR.  I will call [name] [name] with these instructions.

## 2015-05-28 ENCOUNTER — Ambulatory Visit
Admission: RE | Admit: 2015-05-28 | Discharge: 2015-05-28 | Disposition: A | Discharge status: Home / Self Care | Payer: Medicare Other | Source: Ambulatory Visit | Attending: Cardiothoracic Surgery | Admitting: Cardiothoracic Surgery

## 2015-05-28 ENCOUNTER — Ambulatory Visit (INDEPENDENT_AMBULATORY_CARE_PROVIDER_SITE_OTHER): Payer: Self-pay | Admitting: Cardiothoracic Surgery

## 2015-05-28 ENCOUNTER — Other Ambulatory Visit: Payer: Self-pay | Admitting: *Deleted

## 2015-05-28 ENCOUNTER — Encounter: Payer: Self-pay | Admitting: *Deleted

## 2015-05-28 VITALS — BP 181/88 | HR 72 | Resp 16 | Ht 64.5 in | Wt 127.0 lb

## 2015-05-28 DIAGNOSIS — J9811 Atelectasis: Secondary | ICD-10-CM | POA: Diagnosis not present

## 2015-05-28 DIAGNOSIS — C259 Malignant neoplasm of pancreas, unspecified: Secondary | ICD-10-CM

## 2015-05-28 DIAGNOSIS — J9382 Other air leak: Secondary | ICD-10-CM

## 2015-05-28 DIAGNOSIS — IMO0002 Reserved for concepts with insufficient information to code with codable children: Secondary | ICD-10-CM

## 2015-05-28 DIAGNOSIS — C799 Secondary malignant neoplasm of unspecified site: Secondary | ICD-10-CM

## 2015-05-28 DIAGNOSIS — J939 Pneumothorax, unspecified: Secondary | ICD-10-CM

## 2015-05-28 NOTE — Progress Notes (Signed)
PCP is Monico Blitz, MD Referring Provider is Darovsky, Marko Stai, MD  Chief Complaint  Patient presents with  . Routine Post Op    f/u with CXR S/P CT/MINI EXPRESS.....WHICH HAS BECOME DISLODGED ON TUESDAY    HPI:the patient returns for a scheduled followup office visit after left VATS for pneumothorax last month secondary to metastatic pancreatic cancer. After chest tube was placed she had persistent airleak and subsequently underwent VATS with wedge resections of several blebs and what turned out to be metastatic foci of pancreatic cancer. Her leak was minimal injuries converted to a mini expressed and discharged to home. She is been followed in the office with the lung fully expanded but with a persistent small leak from the tube. 72 hours ago the tube became dislodged and home health nurse applied a occlusive dressing. A chest x-ray today shows no pneumothorax or she returns now for further surgical followup with a repeat chest x-ray.  Chest x-ray today shows no pneumothorax. The patient is breathing comfortably. An eschar at the chest tube site is a removed and a Neosporin small dressings applied.she is having no incisional chest pain.  The patient will take the pathology report of her pulmonary wedge resection showing metastatic pancreatic cancer to her oncologist. She should not have chemotherapy until after June 1 to allow healing of her lung incisions.   Past Medical History  Diagnosis Date  . Cancer [facility]) July 2010    Pancreatic adenocarcinoma  . Cervical cancer ([place]) 1963  . Emphysema   . Hypertension   . Hypercholesterolemia   . COPD (chronic obstructive pulmonary disease) ([place])   . Abdominal wall hernia   . Aortic aneurysm ([facility])     small aortic aneurysm  . Cataract 2010    b/l surgery    Past Surgical History  Procedure Laterality Date  . Vaginal hysterectomy  1963    Cervical Cancer  . Tonsillectomy  1943  . Whipple procedure  July 2010    pancreatic adenocarcinoma   . Cholecystectomy  July 2010    done w/ Whipple procedure  . Breast surgery  1988    Right breast abcess  . Bunionectomy  2007  . Eye surgery  2009    Bilateral cataract  . Video assisted thoracoscopy Left 04/20/2015    Procedure: VIDEO ASSISTED THORACOSCOPY;  Surgeon: Ivin Poot, MD;  Location: [place];  Service: Thoracic;  Laterality: Left;  . Stapling of blebs Left 04/20/2015    Procedure: STAPLING OF BLEBS;  Surgeon: Ivin Poot, MD;  Location: [facility] OR;  Service: Thoracic;  Laterality: Left;    Family History  Problem Relation Age of Onset  . Pancreatic cancer Brother   . Colon cancer Sister 31    12/2006 dx surgery and chemotherapy  . Ovarian cancer Sister 102    Ovarian ca,  surgery and chemotherapy    Social History Social History  Substance Use Topics  . Smoking status: Former Smoker -- 1.00 packs/day for 30 years    Types: Cigarettes    Quit date: 02/29/1996  . Smokeless tobacco: Never Used  . Alcohol Use: No    Current Outpatient Prescriptions  Medication Sig Dispense Refill  . acetaminophen (TYLENOL) 325 MG tablet Take 650 mg by mouth every 6 (six) hours as needed for moderate pain.     . ferrous [id number] C-folic acid (TRINSICON / FOLTRIN) capsule Take 1 capsule by mouth 2 (two) times daily at 10 AM and 5 PM. 60 capsule 0  .  furosemide (LASIX) 20 MG tablet Take 1 tablet (20 mg total) by mouth daily. 30 tablet 3  . lipase/protease/amylase (CREON) 36000 UNITS CPEP capsule Take 36,000 Units by mouth 3 (three) times daily with meals.    . metoprolol succinate (TOPROL-XL) 50 MG 24 hr tablet Take 25 mg by mouth daily before breakfast. Take with or immediately following a meal.    . omeprazole (PRILOSEC) 20 MG capsule Take 20 mg by mouth daily.     . potassium chloride SA (K-DUR,KLOR-CON) 20 MEQ tablet Take 1 tablet (20 mEq total) by mouth daily. 30 tablet 3  . amiodarone (PACERONE) 200 MG tablet Take 1 tablet (200 mg total) by mouth 2 (two) times daily.  60 tablet 3   No current facility-administered medications for this visit.    Allergies  Allergen Reactions  . Oxaliplatin Anaphylaxis  . Aspirin Other (See Comments)    Burns stomach    Review of Systems   Getting stronger Appetite improving No postthoracotomy pain Surgical incision healing well No cough  BP 181/88 mmHg  Pulse 72  Resp 16  Ht 5' 4.5" (1.638 m)  Wt 127 lb (57.607 kg)  BMI 21.47 kg/m2  SpO2 98% Physical Exam Alert and pleasant Left VATS incision well-healed Breath sounds clear Heart rhythm regular No peripheral edema Neuro intact  Diagnostic Tests: Chest x-ray performed today personally reviewed and counseled with patient No pneumothorax is noted No significant pleural effusion  Impression: Resolved left pneumothorax secondary to metastatic foci of pancreatic cancer  Plan:patient will see her oncologist and 3-4 weeks. She was provided a pathology report of the pulmonary wedge resection specimens from the left lung.  The patient has a 10% chance of recurrent pneumothorax. She knows to have a chest x-ray performed ASAP if she has recurrent symptoms of shortness of breath and focal chest pain.   Len Childs, MD [facility] [phone]

## 2015-05-29 DIAGNOSIS — Z48813 Encounter for surgical aftercare following surgery on the respiratory system: Secondary | ICD-10-CM | POA: Diagnosis not present

## 2015-05-29 DIAGNOSIS — Z8507 Personal history of malignant neoplasm of pancreas: Secondary | ICD-10-CM | POA: Diagnosis not present

## 2015-05-29 DIAGNOSIS — J9383 Other pneumothorax: Secondary | ICD-10-CM | POA: Diagnosis not present

## 2015-05-29 DIAGNOSIS — Z8541 Personal history of malignant neoplasm of cervix uteri: Secondary | ICD-10-CM | POA: Diagnosis not present

## 2015-05-29 DIAGNOSIS — J439 Emphysema, unspecified: Secondary | ICD-10-CM | POA: Diagnosis not present

## 2015-05-29 DIAGNOSIS — I10 Essential (primary) hypertension: Secondary | ICD-10-CM | POA: Diagnosis not present

## 2015-06-03 ENCOUNTER — Ambulatory Visit: Payer: Medicare Other | Admitting: Cardiothoracic Surgery

## 2015-06-05 DIAGNOSIS — Z48813 Encounter for surgical aftercare following surgery on the respiratory system: Secondary | ICD-10-CM | POA: Diagnosis not present

## 2015-06-05 DIAGNOSIS — Z8541 Personal history of malignant neoplasm of cervix uteri: Secondary | ICD-10-CM | POA: Diagnosis not present

## 2015-06-05 DIAGNOSIS — I10 Essential (primary) hypertension: Secondary | ICD-10-CM | POA: Diagnosis not present

## 2015-06-05 DIAGNOSIS — J9383 Other pneumothorax: Secondary | ICD-10-CM | POA: Diagnosis not present

## 2015-06-05 DIAGNOSIS — J439 Emphysema, unspecified: Secondary | ICD-10-CM | POA: Diagnosis not present

## 2015-06-05 DIAGNOSIS — Z8507 Personal history of malignant neoplasm of pancreas: Secondary | ICD-10-CM | POA: Diagnosis not present

## 2015-06-09 DIAGNOSIS — I77819 Aortic ectasia, unspecified site: Secondary | ICD-10-CM | POA: Diagnosis not present

## 2015-06-09 DIAGNOSIS — I82C11 Acute embolism and thrombosis of right internal jugular vein: Secondary | ICD-10-CM | POA: Diagnosis not present

## 2015-06-09 DIAGNOSIS — Z95828 Presence of other vascular implants and grafts: Secondary | ICD-10-CM | POA: Diagnosis not present

## 2015-06-09 DIAGNOSIS — C7800 Secondary malignant neoplasm of unspecified lung: Secondary | ICD-10-CM | POA: Diagnosis not present

## 2015-06-09 DIAGNOSIS — C259 Malignant neoplasm of pancreas, unspecified: Secondary | ICD-10-CM | POA: Diagnosis not present

## 2015-06-09 DIAGNOSIS — C3492 Malignant neoplasm of unspecified part of left bronchus or lung: Secondary | ICD-10-CM | POA: Diagnosis not present

## 2015-06-10 DIAGNOSIS — I82C11 Acute embolism and thrombosis of right internal jugular vein: Secondary | ICD-10-CM | POA: Diagnosis not present

## 2015-06-10 DIAGNOSIS — R978 Other abnormal tumor markers: Secondary | ICD-10-CM | POA: Diagnosis not present

## 2015-06-10 DIAGNOSIS — R5383 Other fatigue: Secondary | ICD-10-CM | POA: Diagnosis not present

## 2015-06-10 DIAGNOSIS — C3490 Malignant neoplasm of unspecified part of unspecified bronchus or lung: Secondary | ICD-10-CM | POA: Diagnosis not present

## 2015-06-10 DIAGNOSIS — Z7901 Long term (current) use of anticoagulants: Secondary | ICD-10-CM | POA: Diagnosis not present

## 2015-06-10 DIAGNOSIS — G08 Intracranial and intraspinal phlebitis and thrombophlebitis: Secondary | ICD-10-CM | POA: Diagnosis not present

## 2015-06-10 DIAGNOSIS — C251 Malignant neoplasm of body of pancreas: Secondary | ICD-10-CM | POA: Diagnosis not present

## 2015-06-10 DIAGNOSIS — J939 Pneumothorax, unspecified: Secondary | ICD-10-CM | POA: Diagnosis not present

## 2015-06-10 DIAGNOSIS — R0602 Shortness of breath: Secondary | ICD-10-CM | POA: Diagnosis not present

## 2015-06-10 DIAGNOSIS — Z5181 Encounter for therapeutic drug level monitoring: Secondary | ICD-10-CM | POA: Diagnosis not present

## 2015-06-10 DIAGNOSIS — C3492 Malignant neoplasm of unspecified part of left bronchus or lung: Secondary | ICD-10-CM | POA: Diagnosis not present

## 2015-06-23 DIAGNOSIS — Z0000 Encounter for general adult medical examination without abnormal findings: Secondary | ICD-10-CM | POA: Diagnosis not present

## 2015-06-23 DIAGNOSIS — Z299 Encounter for prophylactic measures, unspecified: Secondary | ICD-10-CM | POA: Diagnosis not present

## 2015-06-23 DIAGNOSIS — Z7189 Other specified counseling: Secondary | ICD-10-CM | POA: Diagnosis not present

## 2015-06-23 DIAGNOSIS — Z1211 Encounter for screening for malignant neoplasm of colon: Secondary | ICD-10-CM | POA: Diagnosis not present

## 2015-06-23 DIAGNOSIS — Z6823 Body mass index (BMI) 23.0-23.9, adult: Secondary | ICD-10-CM | POA: Diagnosis not present

## 2015-06-23 DIAGNOSIS — Z1389 Encounter for screening for other disorder: Secondary | ICD-10-CM | POA: Diagnosis not present

## 2015-06-30 ENCOUNTER — Other Ambulatory Visit (HOSPITAL_COMMUNITY)
Admission: RE | Admit: 2015-06-30 | Discharge: 2015-06-30 | Disposition: A | Discharge status: Home / Self Care | Payer: Medicare Other | Source: Ambulatory Visit | Attending: Internal Medicine | Admitting: Internal Medicine

## 2015-06-30 DIAGNOSIS — C3412 Malignant neoplasm of upper lobe, left bronchus or lung: Secondary | ICD-10-CM | POA: Insufficient documentation

## 2015-07-02 DIAGNOSIS — I82C11 Acute embolism and thrombosis of right internal jugular vein: Secondary | ICD-10-CM | POA: Diagnosis not present

## 2015-07-02 DIAGNOSIS — C3492 Malignant neoplasm of unspecified part of left bronchus or lung: Secondary | ICD-10-CM | POA: Diagnosis not present

## 2015-07-02 DIAGNOSIS — J939 Pneumothorax, unspecified: Secondary | ICD-10-CM | POA: Diagnosis not present

## 2015-07-02 DIAGNOSIS — D519 Vitamin B12 deficiency anemia, unspecified: Secondary | ICD-10-CM | POA: Diagnosis not present

## 2015-07-02 DIAGNOSIS — R05 Cough: Secondary | ICD-10-CM | POA: Diagnosis not present

## 2015-07-02 DIAGNOSIS — C3490 Malignant neoplasm of unspecified part of unspecified bronchus or lung: Secondary | ICD-10-CM | POA: Diagnosis not present

## 2015-07-02 DIAGNOSIS — Z7901 Long term (current) use of anticoagulants: Secondary | ICD-10-CM | POA: Diagnosis not present

## 2015-07-02 DIAGNOSIS — C259 Malignant neoplasm of pancreas, unspecified: Secondary | ICD-10-CM | POA: Diagnosis not present

## 2015-07-07 ENCOUNTER — Encounter (HOSPITAL_COMMUNITY): Payer: Self-pay

## 2015-07-21 ENCOUNTER — Encounter (HOSPITAL_COMMUNITY): Payer: Self-pay

## 2015-07-22 DIAGNOSIS — C3492 Malignant neoplasm of unspecified part of left bronchus or lung: Secondary | ICD-10-CM | POA: Diagnosis not present

## 2015-07-22 DIAGNOSIS — C251 Malignant neoplasm of body of pancreas: Secondary | ICD-10-CM | POA: Diagnosis not present

## 2015-07-22 DIAGNOSIS — C259 Malignant neoplasm of pancreas, unspecified: Secondary | ICD-10-CM | POA: Diagnosis not present

## 2015-07-22 DIAGNOSIS — Z95828 Presence of other vascular implants and grafts: Secondary | ICD-10-CM | POA: Diagnosis not present

## 2015-07-22 DIAGNOSIS — Z452 Encounter for adjustment and management of vascular access device: Secondary | ICD-10-CM | POA: Diagnosis not present

## 2015-07-22 DIAGNOSIS — C3490 Malignant neoplasm of unspecified part of unspecified bronchus or lung: Secondary | ICD-10-CM | POA: Diagnosis not present

## 2015-07-22 DIAGNOSIS — C7800 Secondary malignant neoplasm of unspecified lung: Secondary | ICD-10-CM | POA: Diagnosis not present

## 2015-07-29 DIAGNOSIS — C3492 Malignant neoplasm of unspecified part of left bronchus or lung: Secondary | ICD-10-CM | POA: Diagnosis not present

## 2015-07-29 DIAGNOSIS — C7800 Secondary malignant neoplasm of unspecified lung: Secondary | ICD-10-CM | POA: Diagnosis not present

## 2015-07-29 DIAGNOSIS — I714 Abdominal aortic aneurysm, without rupture: Secondary | ICD-10-CM | POA: Diagnosis not present

## 2015-07-29 DIAGNOSIS — Z9889 Other specified postprocedural states: Secondary | ICD-10-CM | POA: Diagnosis not present

## 2015-07-29 DIAGNOSIS — C259 Malignant neoplasm of pancreas, unspecified: Secondary | ICD-10-CM | POA: Diagnosis not present

## 2015-07-29 DIAGNOSIS — R0602 Shortness of breath: Secondary | ICD-10-CM | POA: Diagnosis not present

## 2015-07-31 DIAGNOSIS — I82C11 Acute embolism and thrombosis of right internal jugular vein: Secondary | ICD-10-CM | POA: Diagnosis not present

## 2015-07-31 DIAGNOSIS — G08 Intracranial and intraspinal phlebitis and thrombophlebitis: Secondary | ICD-10-CM | POA: Diagnosis not present

## 2015-07-31 DIAGNOSIS — Z7901 Long term (current) use of anticoagulants: Secondary | ICD-10-CM | POA: Diagnosis not present

## 2015-07-31 DIAGNOSIS — C259 Malignant neoplasm of pancreas, unspecified: Secondary | ICD-10-CM | POA: Diagnosis not present

## 2015-07-31 DIAGNOSIS — J939 Pneumothorax, unspecified: Secondary | ICD-10-CM | POA: Diagnosis not present

## 2015-07-31 DIAGNOSIS — C3490 Malignant neoplasm of unspecified part of unspecified bronchus or lung: Secondary | ICD-10-CM | POA: Diagnosis not present

## 2015-08-10 ENCOUNTER — Ambulatory Visit (HOSPITAL_COMMUNITY): Payer: Medicare Other

## 2015-08-10 ENCOUNTER — Other Ambulatory Visit (HOSPITAL_COMMUNITY)
Admission: RE | Admit: 2015-08-10 | Discharge: 2015-08-10 | Disposition: A | Discharge status: Home / Self Care | Payer: Medicare Other | Source: Ambulatory Visit | Attending: Cardiothoracic Surgery | Admitting: Cardiothoracic Surgery

## 2015-08-10 DIAGNOSIS — C7800 Secondary malignant neoplasm of unspecified lung: Secondary | ICD-10-CM | POA: Insufficient documentation

## 2015-08-10 DIAGNOSIS — C259 Malignant neoplasm of pancreas, unspecified: Secondary | ICD-10-CM | POA: Diagnosis not present

## 2015-08-17 ENCOUNTER — Encounter (HOSPITAL_COMMUNITY): Payer: Self-pay

## 2015-08-17 ENCOUNTER — Encounter (HOSPITAL_COMMUNITY): Payer: Medicare Other | Attending: Hematology & Oncology | Admitting: Hematology & Oncology

## 2015-08-17 VITALS — BP 109/82 | HR 65 | Temp 97.7°F | Resp 18 | Ht 64.0 in | Wt 135.5 lb

## 2015-08-17 DIAGNOSIS — C7802 Secondary malignant neoplasm of left lung: Secondary | ICD-10-CM | POA: Diagnosis not present

## 2015-08-17 DIAGNOSIS — Z7901 Long term (current) use of anticoagulants: Secondary | ICD-10-CM

## 2015-08-17 DIAGNOSIS — Z87891 Personal history of nicotine dependence: Secondary | ICD-10-CM | POA: Diagnosis not present

## 2015-08-17 DIAGNOSIS — Z800 Family history of malignant neoplasm of digestive organs: Secondary | ICD-10-CM

## 2015-08-17 DIAGNOSIS — Z808 Family history of malignant neoplasm of other organs or systems: Secondary | ICD-10-CM | POA: Diagnosis not present

## 2015-08-17 DIAGNOSIS — C259 Malignant neoplasm of pancreas, unspecified: Secondary | ICD-10-CM | POA: Diagnosis not present

## 2015-08-17 DIAGNOSIS — I82C11 Acute embolism and thrombosis of right internal jugular vein: Secondary | ICD-10-CM

## 2015-08-17 DIAGNOSIS — R0600 Dyspnea, unspecified: Secondary | ICD-10-CM | POA: Diagnosis not present

## 2015-08-17 DIAGNOSIS — J910 Malignant pleural effusion: Secondary | ICD-10-CM

## 2015-08-17 DIAGNOSIS — C7800 Secondary malignant neoplasm of unspecified lung: Principal | ICD-10-CM

## 2015-08-17 DIAGNOSIS — Z8041 Family history of malignant neoplasm of ovary: Secondary | ICD-10-CM | POA: Diagnosis not present

## 2015-08-17 DIAGNOSIS — Z8541 Personal history of malignant neoplasm of cervix uteri: Secondary | ICD-10-CM

## 2015-08-17 NOTE — Patient Instructions (Addendum)
[facility] at [facility] Discharge Instructions  RECOMMENDATIONS MADE BY THE CONSULTANT AND ANY TEST RESULTS WILL BE SENT TO YOUR REFERRING PHYSICIAN.  Exam done and seen today by Dr. Whitney Muse Scans were reviewed today. Return to see the doctor in Please call the clinic if you have any questions or concerns  Thank you for choosing [facility] at [facility] to provide your oncology and hematology care.  To afford each patient quality time with our provider, please arrive at least 15 minutes before your scheduled appointment time.   Beginning January 23rd 2017 lab work for the [facility] will be done in the  Main lab at [facility] on 1st floor. If you have a lab appointment with the [facility] please come in thru the  Main Entrance and check in at the main information desk  You need to re-schedule your appointment should you arrive 10 or more minutes late.  We strive to give you quality time with our providers, and arriving late affects you and other patients whose appointments are after yours.  Also, if you no show three or more times for appointments you may be dismissed from the clinic at the providers discretion.     Again, thank you for choosing [facility].  Our hope is that these requests will decrease the amount of time that you wait before being seen by our physicians.       _____________________________________________________________  Should you have questions after your visit to [facility], please contact our office at [phone] between the hours of 8:30 a.m. and 4:30 p.m.  Voicemails left after 4:30 p.m. will not be returned until the following business day.  For prescription refill requests, have your pharmacy contact our office.         Resources For Cancer Patients and their Caregivers ? American Cancer Society: Can assist with transportation, wigs, general needs, runs Look Good Feel  Better.        989-327-9127 ? Cancer Care: Provides financial assistance, online support groups, medication/co-pay assistance.  1-800-813-HOPE 7437250705) ? [place] Assists [place] cancer patients and their families through emotional , educational and financial support.  340-362-2517 ? [place] DSS Where to apply for food stamps, Medicaid and utility assistance. 502-840-0767 ? [facility]: Transportation to medical appointments. 604 063 2654 ? Social Security Administration: May apply for disability if have a Stage IV cancer. (973) 184-9911 7270148591 ? [employer], Disability and Transit Services: Assists with nutrition, care and transit needs. [place] Support Programs: '@10RELATIVEDAYS'$ @ > Cancer Support Group  2nd Tuesday of the month 1pm-2pm, Journey Room  > Creative Journey  3rd Tuesday of the month 1130am-1pm, Journey Room  > Look Good Feel Better  1st Wednesday of the month 10am-12 noon, Journey Room (Call [place] to register [phone])

## 2015-08-17 NOTE — Progress Notes (Signed)
[place]  CONSULT NOTE  Patient Care Team: Monico Blitz, MD as PCP - General  CHIEF COMPLAINTS/PURPOSE OF CONSULTATION:    Pancreatic cancer metastasized to lung [facility])   09/03/2008 Surgery Whipple with Dr. Birdie Sons for T3N1 3/12 LN+ Pancreatic Adenocarcinoma   11/13/2008 - 09/12/2009 Chemotherapy Adjuvant Gemcitabine X 6 months   08/31/2009 Surgery Metastectomy with Dr. Arlyce Dice RUL 2 nodules, RLL (wedge resection X 2)   08/31/2009 Pathology Results Metastatic adenocarcinoma TTF-1 negative   06/03/2010 Surgery Left video-assisted thoracoscopic surgery, resection of left lower lobe lesion. with Dr. Arlyce Dice   06/04/2010 Pathology Results adenocarcinoma positive for cytokeratin 7, focally positive for CDX2, with scattered staining for TTF1 cytokeratin 20. [facility] opinion felt to be lung primary although noted to stain for GI markers, not felt to be pancreatic primary. EGFR and ALK negative   03/31/2011 - 09/04/2011 Radiation Therapy SBRT LUL 18Gy Dr. Tammi Klippel   03/15/2012 - 06/13/2012 Chemotherapy 5-FU based chemotherapy   02/13/2013 - 05/20/2013 Chemotherapy Gemzar Abraxane for metastatic disease (given to cover both primaries at [facility])   07/15/2014 PET scan Mild progression of pulm mets, hypermet mesenteric tumor assoc with  SMA is stable to slightly increased in degree of FDG uptake   07/23/2014 Imaging    07/30/2014 - 09/10/2014 Chemotherapy Gemzar abraxane stopped due to poor tolerance   04/06/2015 Procedure L 20 French chest tube for spontaneous pneumothorax, by Dr. Tharon Aquas Trigt   04/20/2015 Pathology Results High grade carcinoma c/w patient's known pancreatic primary (secondary opinion at [facility]) PDL-1 high expression, preserved major and minor MMR, MSI stable, [facility] ONE testing performed,    04/20/2015 Surgery Left VATS, mini thoracotomy with stapling and oversewing of blebs, Talc pleurodesis. performed secondary to persistent air leak not improved with  chest tube drainage   07/29/2015 Imaging No signif change in appear of multifocal B cavitary pulmonary lesions, postop appearance upper abdomen, no findings of met disease to abd or pelvis,    HISTORY OF PRESENTING ILLNESS:  [name] [name] 75 y.o. female is here because of a history of stage IV pancreatic carcinoma, she is also reported to have a history of stage IV BAC of the lungs. She is here today accompanied by her sister, [name] [name].  Regarding the onset of her pancreatic cancer, she says she started noticing that she would get tired and lay down, which "wasn't me." She was also told that she had jaundice and needed to go see what was going on. She went to the hospital, saw Dr. Laural Golden, and the pancreatic cancer was discovered. She was sent to [facility] to have the Whipple done, then had chemotherapy done after that; "several rounds of chemo." She says she felt pretty good the whole time, other than being allergic to one option. She says "I never got sick from the chemo or anything."  She notes "I guess I've been doing good; I haven't had chemo for, has it been almost two years?" She notes an allergy to oxaliplatin. When she tried to take the oxaliplatin, the patient remarks that her blood pressure shot up, and her heart rate shot up; her throat closed in. Her sister comments that the patient received a shot of benadryl.  After [name] [name] had the pancreatic surgery, she says that the cancer metastasized to both lungs. Dr. Arlyce Dice went in and removed some of the nodules. Her sister says "you had the pancreatic in one cancer, and you had small cell lung cancer in one lung." She  says "he took a piece of the lung out, plus some nodules." Her sister notes "you had that radiation shot one time," with Dr. Tammi Klippel in [place].   [name] [name] says that her sister takes care of her and that she doesn't know what she'd do without her.  Regarding where they left off at [facility], she says "I think he says I  can't do any more therapy, so I think he was thinking I'd do immune therapy." She says "I'm not quite as up to par after the last surgery; I had a collapsed lung in February. It seems like I need to snap back quicker." She says that, overall, she still does most all of her work and keeps on going. She notes that her appetite isn't doing too good right now, with some days better than others.  She says she's noticed a little pain in her back the last couple of days, and that she's also noticed a small cough. The cough has been here for about a week. She denies any fever, denies coughing up anything, and confirms that her breathing is the same. She denies any abdominal pain, nausea, or vomiting.  They were told that the immunotherapy would do nothing for the pancreatic, but it may help the lung cancer.  She knows she has emphysema, and that it hasn't been getting any worse.  Her sister notes "she is very short-winded." [name] [name] says "some days I am worse than others." She says she doesn't have the breath that she used to have, and thinks it's gotten worse since the surgery in February. She says "I feel like, a lot of the surgery, I should snap back a little more." Her sister says "she doesn't always say everything." Her sister [name] [name] notes "we also just know that she should have died years ago with the pancreatic cancer, so we know we have been blessed."  [name] [name]'s son, the patient [name] [name]'s nephew, is her healthcare power of attorney. She confirms that she has her living will and all of that sorted out. She says "we did all of that before my husband got sick."  When asked what she's thinking, she notes "If you don't think we need to do anything right now, then we can do that." She says "I have to trust you to do what needs to be done."  The patient notes that no one has ever reviewed the NCCN guidelines with her.  Her sister [name] [name] says  "We're trusting in you and God." "I wanna keep her." She notes  that Dr. Keturah Barre told her that, if she does not get the immunotherapy approved, and could not take it, that he thought she would just leave well enough alone; that he didn't want to do something that would "put her to bed." Ms. Gotcher notes "I would like to continue living like I'm living if it's not getting any worse," confirming that she currently has a high quality of life. She was reassured that we don't want to deprive her of quality of life.  In the end, she notes "I'm a fighter."  During the physical exam, she says she's lost 2 or 3 pounds since February, but nothing too bad. She says that she sleeps pretty good at night, but wakes up 2 or 3 times per night to get a cup of milk or coffee. Both Ms. Hottenstein and her sister note that they can drink regular caffeinated coffee and then go right back to bed.  She denies any  pain when her abdomen is palpated. She notes that some days she will have a little pain in her right groin area. She notes "some days I will have several, several bowel movements." She took an immodium yesterday to control her bowel movements. She confirms that her hernia is very large, and displays it during her physical exam. She says "as long as my bowels don't get blocked, I'm okay."  MEDICAL HISTORY:  Past Medical History  Diagnosis Date  . Cancer [facility]) July 2010    Pancreatic adenocarcinoma  . Cervical cancer ([place]) 1963  . Emphysema   . Hypertension   . Hypercholesterolemia   . COPD (chronic obstructive pulmonary disease) ([place])   . Abdominal wall hernia   . Aortic aneurysm ([facility])     small aortic aneurysm  . Cataract April 23, 2008    b/l surgery    SURGICAL HISTORY: Past Surgical History  Procedure Laterality Date  . Vaginal hysterectomy  1963    Cervical Cancer  . Tonsillectomy  1941[birth year]04[birth year]  . Whipple procedure  July 2010    pancreatic adenocarcinoma  . Cholecystectomy  July 2010    done w/ Whipple procedure  . Breast surgery  1988    Right breast abcess  . Bunionectomy   04-23-05  . Eye surgery  04-24-2007    Bilateral cataract  . Video assisted thoracoscopy Left 04/20/2015    Procedure: VIDEO ASSISTED THORACOSCOPY;  Surgeon: Ivin Poot, MD;  Location: [place];  Service: Thoracic;  Laterality: Left;  . Stapling of blebs Left 04/20/2015    Procedure: STAPLING OF BLEBS;  Surgeon: Ivin Poot, MD;  Location: [place];  Service: Thoracic;  Laterality: Left;    SOCIAL HISTORY: Social History   Social History  . Marital Status: Widowed    Spouse Name: N/A  . Number of Children: 0  . Years of Education: N/A   Occupational History  .     Social History Main Topics  . Smoking status: Former Smoker -- 1.00 packs/day for 30 years    Types: Cigarettes    Quit date: 02/29/1996  . Smokeless tobacco: Never Used  . Alcohol Use: No  . Drug Use: No  . Sexual Activity: No   Other Topics Concern  . Not on file   Social History Narrative   Widowed; 5 years ago in December. Married 32 years, no children. Had cervical cancer when she was 21 Pancreatic cancer in July of 2010. Worked at [employer] for 45 years; also worked i Solicitor, at [employer], Social research officer, government. Smoked, but quit 15-20 years ago.  Enjoys quilting, crocheting, knitting. Has nieces and nephews.  Was born in [place].  FAMILY HISTORY: Family History  Problem Relation Age of Onset  . Pancreatic cancer Brother   . Colon cancer Sister 21    12/2006 dx surgery and chemotherapy  . Ovarian cancer Sister 53    Ovarian ca,  surgery and chemotherapy    One older sister and one younger sister still living. Four brothers deceased. Daddy died of emphysema back in 04/23/1973 at the age of 26-68 Mother was 38 when she died in April 24, 1987. Had 4 brothers, died from 23-Apr-1998 to 04/23/06 Heart trouble, emphysema & dialysis ("he just had everything"). One brother had lung disease. One brother had pancreatic cancer and lived less than 2 months. [name] [name] has had ovarian and colon cancer. Nephew that died last year with brain cancer. Niece with cancer  being treated here.   ALLERGIES:  is allergic to oxaliplatin and aspirin.  MEDICATIONS:  Current Outpatient Prescriptions  Medication Sig Dispense Refill  . acetaminophen (TYLENOL) 325 MG tablet Take 650 mg by mouth every 6 (six) hours as needed for moderate pain.     Marland Kitchen amiodarone (PACERONE) 200 MG tablet Take 1 tablet (200 mg total) by mouth 2 (two) times daily. 60 tablet 3  . ferrous [id number] C-folic acid (TRINSICON / FOLTRIN) capsule Take 1 capsule by mouth 2 (two) times daily at 10 AM and 5 PM. 60 capsule 0  . furosemide (LASIX) 20 MG tablet Take 1 tablet (20 mg total) by mouth daily. 30 tablet 3  . lipase/protease/amylase (CREON) 36000 UNITS CPEP capsule Take 1 capsule (36,000 Units total) by mouth 3 (three) times daily with meals. 270 capsule 1  . metoprolol succinate (TOPROL-XL) 50 MG 24 hr tablet Take 25 mg by mouth daily before breakfast. Take with or immediately following a meal.    . omeprazole (PRILOSEC) 20 MG capsule Take 20 mg by mouth daily.     . potassium chloride SA (K-DUR,KLOR-CON) 20 MEQ tablet Take 1 tablet (20 mEq total) by mouth daily. 30 tablet 3   No current facility-administered medications for this visit.    Review of Systems  Constitutional: Negative.   HENT: Negative.   Eyes: Negative.   Respiratory: Positive for cough.        Mild cough for the past week.  Cardiovascular: Negative.   Gastrointestinal: Positive for diarrhea. Negative for nausea, vomiting, abdominal pain and constipation.       Several bowel movements recently.  Genitourinary: Negative.   Musculoskeletal: Positive for back pain.       Mild back pain in lower right side.  Skin: Negative.   Neurological: Negative.   Endo/Heme/Allergies: Negative.   Psychiatric/Behavioral: Negative.   All other systems reviewed and are negative.  14 point ROS was done and is otherwise as detailed above or in HPI   PHYSICAL EXAMINATION: ECOG PERFORMANCE STATUS: 1 - Symptomatic but  completely ambulatory  Filed Vitals:   08/17/15 0959  BP: 109/82  Pulse: 65  Temp: 97.7 F (36.5 C)  Resp: 18   Filed Weights   08/17/15 0959  Weight: 135 lb 8 oz (61.462 kg)     Physical Exam  Constitutional: She is oriented to person, place, and time and well-developed, well-nourished, and in no distress. No distress.  Well groomed  HENT:  Head: Normocephalic and atraumatic.  Mouth/Throat: Oropharynx is clear and moist. No oropharyngeal exudate.  Eyes: Conjunctivae and EOM are normal. Pupils are equal, round, and reactive to light. Right eye exhibits no discharge. Left eye exhibits no discharge. No scleral icterus.  Neck: Normal range of motion. Neck supple. No tracheal deviation present. No thyromegaly present.  Cardiovascular: Normal rate, regular rhythm and intact distal pulses.   No murmur heard. Pulmonary/Chest: Effort normal and breath sounds normal.  History of emphysema.  Abdominal: Soft. Bowel sounds are normal. She exhibits no distension and no mass. There is no tenderness. There is no rebound and no guarding.  Large midline incisional hernia, central abdomen.  Musculoskeletal: Normal range of motion. She exhibits no edema or tenderness.  Lymphadenopathy:    She has no cervical adenopathy.  Neurological: She is alert and oriented to person, place, and time. Gait normal. Coordination normal.  Skin: Skin is warm and dry. She is not diaphoretic.  Psychiatric: Mood, memory, affect and judgment normal.  Nursing note and vitals reviewed.   LABORATORY DATA:  I have reviewed the data as listed  CMP     Component Value Date/Time   NA 138 04/24/2015 0430   K 3.7 04/24/2015 0430   CL 103 04/24/2015 0430   CO2 25 04/24/2015 0430   GLUCOSE 89 04/24/2015 0430   BUN 14 04/24/2015 0430   CREATININE 0.95 04/24/2015 0430   CALCIUM 8.8* 04/24/2015 0430   PROT 4.9* 04/22/2015 0403   ALBUMIN 1.8* 04/22/2015 0403   AST 20 04/22/2015 0403   ALT 19 04/22/2015 0403    ALKPHOS 68 04/22/2015 0403   BILITOT 0.5 04/22/2015 0403   GFRNONAA 57* 04/24/2015 0430   GFRAA >60 04/24/2015 0430       PATHOLOGY     2012                  RADIOGRAPHIC STUDIES: I have personally reviewed the radiological images as listed and agreed with the findings in the report. No results found.  ASSESSMENT & PLAN:  Stage IV Pancreatic Adenocarcinoma Lung Adenocarcinoma RIJ thrombosis Transverse Sinus Thrombosis Chronic Anticoagulation with lovenox L pneumothorax, spontaneous Malignant Pleural Effusion, Talc Pleurodesis Preserved expression of major/minor MMR proteins done on 2017 LUL biopsy by Dr. Prescott Gum Dyspnea  Complicated course but she has certainly done well for many years. She and her sister are not unrealistic. She has a HCPOA and living will in place. We discussed that although her pancreatic cancer is PDL1 positive this unfortunately does not translate into benefit from immunotherapy, MSI - high status does and unfortunately, her disease is MSI - stable. .  We reviewed her scans on June 7th, discussing how there has been minimal change on her scans. However her CA 19-9 level is measurable and rising, which is most likely evidence that her pancreatic cancer is progressing. (especially given recent surgery and path with Dr. Prescott Gum)   Her worsening SOB is multifactorial, she has had multiple wedge resections, underlying COPD, recent spontaneous pneumothorax, malignant pleural effusion with talc pleurodesis. I also suspect slowly progressive disease.   Given limited options available to her for her pancreatic adenocarcinoma and her reported difficulties with therapy; I would recommend repeating imaging in approximately 8 weeks. If further progression she may be eligible for  irinotecan-based therapy for her pancreatic cancer. WE may also consider trying her on XELODA. I can certainly discuss with Dr. Benay Spice his thoughts as well.   She does  not need any refills today.  She knows that, if she doesn't feel well in the meantime, if her cough gets any worse, she can call in and see [place] here.  All questions were answered. The patient knows to call the clinic with any problems, questions or concerns.  This document serves as a record of services personally performed by Ancil Linsey, MD. It was created on her behalf by Toni Amend, a trained medical scribe. The creation of this record is based on the scribe's personal observations and the provider's statements to them. This document has been checked and approved by the attending provider.  I have reviewed the above documentation for accuracy and completeness and I agree with the above.  This note was electronically signed.    Molli Hazard, MD  09/05/2015 9:16 PM

## 2015-08-18 ENCOUNTER — Encounter (HOSPITAL_COMMUNITY): Payer: Self-pay

## 2015-08-29 ENCOUNTER — Other Ambulatory Visit (HOSPITAL_COMMUNITY): Payer: Self-pay | Admitting: Hematology & Oncology

## 2015-08-29 MED ORDER — PANCRELIPASE (LIP-PROT-AMYL) 36000-114000 UNITS PO CPEP: 36000.0000 [IU] | ORAL_CAPSULE | Freq: Three times a day (TID) | ORAL | Status: DC

## 2015-09-01 ENCOUNTER — Telehealth (HOSPITAL_COMMUNITY): Payer: Self-pay | Admitting: *Deleted

## 2015-09-01 ENCOUNTER — Other Ambulatory Visit (HOSPITAL_COMMUNITY): Payer: Self-pay | Admitting: Hematology & Oncology

## 2015-09-01 NOTE — Telephone Encounter (Signed)
I requested PCR on specimen [id number] today. Spoke with Dr. Saralyn Pilar.

## 2015-09-04 ENCOUNTER — Other Ambulatory Visit (HOSPITAL_COMMUNITY): Payer: Self-pay | Admitting: Hematology & Oncology

## 2015-09-04 DIAGNOSIS — C259 Malignant neoplasm of pancreas, unspecified: Secondary | ICD-10-CM | POA: Insufficient documentation

## 2015-09-04 DIAGNOSIS — C7800 Secondary malignant neoplasm of unspecified lung: Secondary | ICD-10-CM

## 2015-09-04 HISTORY — DX: Malignant neoplasm of pancreas, unspecified: C25.9

## 2015-09-04 HISTORY — DX: Secondary malignant neoplasm of unspecified lung: C78.00

## 2015-09-05 ENCOUNTER — Encounter (HOSPITAL_COMMUNITY): Payer: Self-pay | Admitting: Hematology & Oncology

## 2015-09-23 DIAGNOSIS — Z87891 Personal history of nicotine dependence: Secondary | ICD-10-CM | POA: Diagnosis not present

## 2015-09-23 DIAGNOSIS — C3490 Malignant neoplasm of unspecified part of unspecified bronchus or lung: Secondary | ICD-10-CM | POA: Diagnosis not present

## 2015-09-23 DIAGNOSIS — I10 Essential (primary) hypertension: Secondary | ICD-10-CM | POA: Diagnosis not present

## 2015-09-23 DIAGNOSIS — K130 Diseases of lips: Secondary | ICD-10-CM | POA: Diagnosis not present

## 2015-09-29 ENCOUNTER — Ambulatory Visit (HOSPITAL_COMMUNITY)
Admission: RE | Admit: 2015-09-29 | Discharge: 2015-09-29 | Disposition: A | Discharge status: Home / Self Care | Payer: Medicare Other | Source: Ambulatory Visit | Attending: Hematology & Oncology | Admitting: Hematology & Oncology

## 2015-09-29 DIAGNOSIS — K439 Ventral hernia without obstruction or gangrene: Secondary | ICD-10-CM | POA: Diagnosis not present

## 2015-09-29 DIAGNOSIS — Z8507 Personal history of malignant neoplasm of pancreas: Secondary | ICD-10-CM | POA: Diagnosis not present

## 2015-09-29 DIAGNOSIS — C7800 Secondary malignant neoplasm of unspecified lung: Secondary | ICD-10-CM | POA: Insufficient documentation

## 2015-09-29 DIAGNOSIS — I2510 Atherosclerotic heart disease of native coronary artery without angina pectoris: Secondary | ICD-10-CM | POA: Diagnosis not present

## 2015-09-29 DIAGNOSIS — I700 Atherosclerosis of aorta: Secondary | ICD-10-CM | POA: Insufficient documentation

## 2015-09-29 DIAGNOSIS — C259 Malignant neoplasm of pancreas, unspecified: Secondary | ICD-10-CM | POA: Diagnosis not present

## 2015-09-29 DIAGNOSIS — C7951 Secondary malignant neoplasm of bone: Secondary | ICD-10-CM | POA: Diagnosis not present

## 2015-09-29 LAB — POCT I-STAT CREATININE: CREATININE: 1.3 mg/dL — AB (ref 0.44–1.00)

## 2015-09-29 MED ORDER — IOPAMIDOL (ISOVUE-300) INJECTION 61%
100.0000 mL | Freq: Once | INTRAVENOUS | Status: AC | PRN
  Administered 2015-09-29: 100 mL via INTRAVENOUS

## 2015-10-01 ENCOUNTER — Encounter (HOSPITAL_COMMUNITY): Payer: Medicare Other

## 2015-10-01 ENCOUNTER — Encounter (HOSPITAL_COMMUNITY): Payer: Self-pay | Admitting: Adult Health

## 2015-10-01 ENCOUNTER — Encounter (HOSPITAL_COMMUNITY): Payer: Medicare Other | Attending: Adult Health | Admitting: Adult Health

## 2015-10-01 VITALS — BP 127/58 | HR 66 | Temp 98.3°F | Resp 20 | Wt 129.0 lb

## 2015-10-01 DIAGNOSIS — C7802 Secondary malignant neoplasm of left lung: Secondary | ICD-10-CM | POA: Diagnosis not present

## 2015-10-01 DIAGNOSIS — C259 Malignant neoplasm of pancreas, unspecified: Secondary | ICD-10-CM

## 2015-10-01 DIAGNOSIS — I82C11 Acute embolism and thrombosis of right internal jugular vein: Secondary | ICD-10-CM | POA: Diagnosis not present

## 2015-10-01 DIAGNOSIS — Z95828 Presence of other vascular implants and grafts: Secondary | ICD-10-CM | POA: Insufficient documentation

## 2015-10-01 DIAGNOSIS — J910 Malignant pleural effusion: Secondary | ICD-10-CM | POA: Diagnosis not present

## 2015-10-01 DIAGNOSIS — R197 Diarrhea, unspecified: Secondary | ICD-10-CM | POA: Insufficient documentation

## 2015-10-01 DIAGNOSIS — C7800 Secondary malignant neoplasm of unspecified lung: Secondary | ICD-10-CM | POA: Diagnosis not present

## 2015-10-01 DIAGNOSIS — R0609 Other forms of dyspnea: Secondary | ICD-10-CM

## 2015-10-01 LAB — COMPREHENSIVE METABOLIC PANEL
ALK PHOS: 119 U/L (ref 38–126)
ALT: 11 U/L — ABNORMAL LOW (ref 14–54)
ANION GAP: 6 (ref 5–15)
AST: 21 U/L (ref 15–41)
Albumin: 4.2 g/dL (ref 3.5–5.0)
BILIRUBIN TOTAL: 0.6 mg/dL (ref 0.3–1.2)
BUN: 22 mg/dL — ABNORMAL HIGH (ref 6–20)
CALCIUM: 9.4 mg/dL (ref 8.9–10.3)
CO2: 21 mmol/L — ABNORMAL LOW (ref 22–32)
CREATININE: 1.33 mg/dL — AB (ref 0.44–1.00)
Chloride: 110 mmol/L (ref 101–111)
GFR, EST AFRICAN AMERICAN: 43 mL/min — AB (ref >60)
GFR, EST NON AFRICAN AMERICAN: 37 mL/min — AB (ref >60)
Glucose, Bld: 118 mg/dL — ABNORMAL HIGH (ref 65–99)
Potassium: 4.7 mmol/L (ref 3.5–5.1)
Sodium: 137 mmol/L (ref 135–145)
TOTAL PROTEIN: 7.2 g/dL (ref 6.5–8.1)

## 2015-10-01 LAB — CBC WITH DIFFERENTIAL/PLATELET
BASOS ABS: 0 10*3/uL (ref 0.0–0.1)
BASOS PCT: 0 %
EOS PCT: 0 %
Eosinophils Absolute: 0 10*3/uL (ref 0.0–0.7)
HEMATOCRIT: 33 % — AB (ref 36.0–46.0)
Hemoglobin: 11.1 g/dL — ABNORMAL LOW (ref 12.0–15.0)
Lymphocytes Relative: 24 %
Lymphs Abs: 2.4 10*3/uL (ref 0.7–4.0)
MCH: 30.9 pg (ref 26.0–34.0)
MCHC: 33.6 g/dL (ref 30.0–36.0)
MCV: 91.9 fL (ref 78.0–100.0)
MONO ABS: 0.9 10*3/uL (ref 0.1–1.0)
MONOS PCT: 9 %
Neutro Abs: 6.6 10*3/uL (ref 1.7–7.7)
Neutrophils Relative %: 67 %
PLATELETS: 135 10*3/uL — AB (ref 150–400)
RBC: 3.59 MIL/uL — ABNORMAL LOW (ref 3.87–5.11)
RDW: 13.6 % (ref 11.5–15.5)
WBC: 10 10*3/uL (ref 4.0–10.5)

## 2015-10-01 NOTE — Progress Notes (Addendum)
[place]  PROGRESS NOTE  Patient Care Team: [name] Blitz, MD as PCP - General  CHIEF COMPLAINTS/PURPOSE OF CONSULTATION:    Pancreatic cancer metastasized to lung [facility])   09/03/2008 Surgery    Whipple with Dr. Birdie [name] for T3N1 3/12 LN+ Pancreatic Adenocarcinoma     11/13/2008 - 09/12/2009 Chemotherapy    Adjuvant Gemcitabine X 6 months     08/31/2009 Surgery    Metastectomy with Dr. Arlyce [name] RUL 2 nodules, RLL (wedge resection X 2)     08/31/2009 Pathology Results    Metastatic adenocarcinoma TTF-1 negative     06/03/2010 Surgery    Left video-assisted thoracoscopic surgery, resection of left lower lobe lesion. with Dr. Arlyce [name]     06/04/2010 Pathology Results    adenocarcinoma positive for cytokeratin 7, focally positive for CDX2, with scattered staining for TTF1 cytokeratin 20. [facility] opinion felt to be lung primary although noted to stain for GI markers, not felt to be pancreatic primary. EGFR and ALK negative     03/31/2011 - 09/04/2011 Radiation Therapy    SBRT LUL 18Gy Dr. Tammi [name]     03/15/2012 - 06/13/2012 Chemotherapy    5-FU based chemotherapy     02/13/2013 - 05/20/2013 Chemotherapy    Gemzar Abraxane for metastatic disease (given to cover both primaries at [facility])     07/15/2014 PET scan    Mild progression of pulm mets, hypermet mesenteric tumor assoc with  SMA is stable to slightly increased in degree of FDG uptake     07/23/2014 Imaging         07/30/2014 - 09/10/2014 Chemotherapy    Gemzar abraxane stopped due to poor tolerance     04/06/2015 Procedure    L 20 French chest tube for spontaneous pneumothorax, by Dr. Tharon Aquas [name]     04/20/2015 Pathology Results    High grade carcinoma c/w patient's known pancreatic primary (secondary opinion at [facility]) PDL-1 high expression, preserved major and minor MMR, MSI stable, [facility] ONE testing performed,      04/20/2015 Surgery    Left VATS, mini thoracotomy with stapling  and oversewing of blebs, Talc pleurodesis. performed secondary to persistent air leak not improved with chest tube drainage     07/29/2015 Imaging    No signif change in appear of multifocal B cavitary pulmonary lesions, postop appearance upper abdomen, no findings of met disease to abd or pelvis,      HISTORY OF PRESENTING ILLNESS:  (from prior visit on 08/10/33) [name] [name] 75 y.o. female is here because of a history of stage IV pancreatic carcinoma, she is also reported to have a history of stage IV BAC of the lungs. She is here today accompanied by her sister, [name] [name].  Regarding the onset of her pancreatic cancer, she says she started noticing that she would get tired and lay down, which "wasn't me." She was also told that she had jaundice and needed to go see what was going on. She went to the hospital, saw Dr. Laural [name], and the pancreatic cancer was discovered. She was sent to [facility] to have the Whipple done, then had chemotherapy done after that; "several rounds of chemo." She says she felt pretty good the whole time, other than being allergic to one option. She says "I never got sick from the chemo or anything."  She notes "I guess I've been doing good; I haven't had chemo for, has it been almost two years?" She notes an allergy to oxaliplatin.  When she tried to take the oxaliplatin, the patient remarks that her blood pressure shot up, and her heart rate shot up; her throat closed in. Her sister comments that the patient received a shot of benadryl.  After [name] [name] had the pancreatic surgery, she says that the cancer metastasized to both lungs. Dr. Arlyce [name] went in and removed some of the nodules. Her sister says "you had the pancreatic in one cancer, and you had small cell lung cancer in one lung." She says "he took a piece of the lung out, plus some nodules." Her sister notes "you had that radiation shot one time," with Dr. Tammi [name] in [place].   [name] [name] says that her sister takes  care of her and that she doesn't know what she'd do without her.  Regarding where they left off at [facility], she says "I think he says I can't do any more therapy, so I think he was thinking I'd do immune therapy." She says "I'm not quite as up to par after the last surgery; I had a collapsed lung in February. It seems like I need to snap back quicker." She says that, overall, she still does most all of her work and keeps on going. She notes that her appetite isn't doing too good right now, with some days better than others.  She says she's noticed a little pain in her back the last couple of days, and that she's also noticed a small cough. The cough has been here for about a week. She denies any fever, denies coughing up anything, and confirms that her breathing is the same. She denies any abdominal pain, nausea, or vomiting.  They were told that the immunotherapy would do nothing for the pancreatic, but it may help the lung cancer.  She knows she has emphysema, and that it hasn't been getting any worse.  Her sister notes "she is very short-winded." [name] [name] says "some days I am worse than others." She says she doesn't have the breath that she used to have, and thinks it's gotten worse since the surgery in February. She says "I feel like, a lot of the surgery, I should snap back a little more." Her sister says "she doesn't always say everything." Her sister [name] [name] notes "we also just know that she should have died years ago with the pancreatic cancer, so we know we have been blessed."  [name] [name]'s son, the patient [name] [name]'s nephew, is her healthcare power of attorney. She confirms that she has her living will and all of that sorted out. She says "we did all of that before my husband got sick."  When asked what she's thinking, she notes "If you don't think we need to do anything right now, then we can do that." She says "I have to trust you to do what needs to be done."  The patient notes  that no one has ever reviewed the NCCN guidelines with her.  Her sister [name] [name] says  "We're trusting in you and God." "I wanna keep her." She notes that Dr. Keturah Barre told her that, if she does not get the immunotherapy approved, and could not take it, that he thought she would just leave well enough alone; that he didn't want to do something that would "put her to bed." Ms. Setzer notes "I would like to continue living like I'm living if it's not getting any worse," confirming that she currently has a high quality of life. She was reassured that we don't want to  deprive her of quality of life.  In the end, she notes "I'm a fighter."  During the physical exam, she says she's lost 2 or 3 pounds since February, but nothing too bad. She says that she sleeps pretty good at night, but wakes up 2 or 3 times per night to get a cup of milk or coffee. Both Ms. Thom and her sister note that they can drink regular caffeinated coffee and then go right back to bed.  She denies any pain when her abdomen is palpated. She notes that some days she will have a little pain in her right groin area. She notes "some days I will have several, several bowel movements." She took an immodium yesterday to control her bowel movements. She confirms that her hernia is very large, and displays it during her physical exam. She says "as long as my bowels don't get blocked, I'm okay."   INTERVAL HISTORY (10/01/15):  Ms. Fabel returns to the [facility] today accompanied again by her sister, [name] [name].   At the start of her appointment, when asked about any complaints, notes that sometimes, after she has a bowel movement, she has a lot of discomfort on her lower abdominal R side, curving around to her back. The patient remembers discussing immunotherapy and irinotecan, and other therapy options at her last appointment.  After reviewing her scans, which show some slight progression, she notes "I thought that Dr. Jacquiline Doe told me that my  body had had all the chemotherapy that it could stand," and was reassured that Dr. Whitney Muse has spoken to Dr. Jacquiline Doe about her current condition as well. The patient was advised that some form of chemotherapy is still certainly an option for her moving forward.  With regards to the decision she wants to make about her own treatment and plan, the patient notes "I trust you to do whatever needs to be done." "Y'all are the ones that have the training." She wonders about her blood work, and whether her CA 19-9 levels have been rising since her last appointment. Her last blood draw was on 11/29/15 prior to her CT scan, which only shows her creatinine needed prior to IV contrast. She will have further lab work drawn today to assess current CA 19-9 levels.  Ms. Carvey takes the news of her slight progression today very well, saying "I've been blessed since 2010."  She notes that she's been working in the garden recently, which has caused her to feel a little sore today. During the physical exam, she notes that she had a swollen lymph node in her (R) upper neck several months back, which did end up going away. She was advised that she likely had a little virus which caused that swelling to occur and resolve on its own.  She confirms that she's able to urinate okay. She shows good strength and ROM in her legs. The patient looks and sounds great today.     MEDICAL HISTORY:  Past Medical History:  Diagnosis Date  . Abdominal wall hernia   . Aortic aneurysm (HCC)    small aortic aneurysm  . Cancer [facility]) July 2010   Pancreatic adenocarcinoma  . Cataract 2010   b/l surgery  . Cervical cancer ([place]) 1963  . COPD (chronic obstructive pulmonary disease) ([place])   . Emphysema   . Hypercholesterolemia   . Hypertension     SURGICAL HISTORY: Past Surgical History:  Procedure Laterality Date  . BREAST SURGERY  1988   Right breast abcess  .  BUNIONECTOMY  May 16, 2005  . CHOLECYSTECTOMY  July 2010   done w/  Whipple procedure  . EYE SURGERY  05-17-07   Bilateral cataract  . STAPLING OF BLEBS Left 04/20/2015   Procedure: STAPLING OF BLEBS;  Surgeon: Ivin Poot, MD;  Location: [place];  Service: Thoracic;  Laterality: Left;  . TONSILLECTOMY  1941/05/16  . VAGINAL HYSTERECTOMY  1963   Cervical Cancer  . VIDEO ASSISTED THORACOSCOPY Left 04/20/2015   Procedure: VIDEO ASSISTED THORACOSCOPY;  Surgeon: Ivin Poot, MD;  Location: [place];  Service: Thoracic;  Laterality: Left;  . WHIPPLE PROCEDURE  July 2010   pancreatic adenocarcinoma    SOCIAL HISTORY: Social History   Social History  . Marital status: Widowed    Spouse name: N/A  . Number of children: 0  . Years of education: N/A   Occupational History  .  Retired   Social History Main Topics  . Smoking status: Former Smoker    Packs/day: 1.00    Years: 30.00    Types: Cigarettes    Quit date: 02/29/1996  . Smokeless tobacco: Never Used  . Alcohol use No  . Drug use: No  . Sexual activity: No   Other Topics Concern  . Not on file   Social History Narrative  . No narrative on file   Widowed; 5 years ago in December. Married 32 years, no children. Had cervical cancer when she was 21 Pancreatic cancer in July of 2010. Worked at [employer] for 46 years; also worked i Solicitor, at [employer], Social research officer, government. Smoked, but quit 15-20 years ago.  Enjoys quilting, crocheting, knitting. Has nieces and nephews.  Was born in [place].  FAMILY HISTORY: Family History  Problem Relation Age of Onset  . Pancreatic cancer Brother   . Colon cancer Sister 62    12/2006 dx surgery and chemotherapy  . Ovarian cancer Sister 59    Ovarian ca,  surgery and chemotherapy    One older sister and one younger sister still living. Four brothers deceased. Daddy died of emphysema back in 05/16/1973 at the age of 46-68 Mother was 10 when she died in 05-17-87. Had 4 brothers, died from May 17, 1998 to 05/17/06 Heart trouble, emphysema & dialysis ("he just had everything"). One brother had lung  disease. One brother had pancreatic cancer and lived less than 2 months. [name] [name] has had ovarian and colon cancer. Nephew that died last year with brain cancer. Niece with cancer being treated here.   ALLERGIES:  is allergic to oxaliplatin and aspirin.  MEDICATIONS:  Current Outpatient Prescriptions  Medication Sig Dispense Refill  . acetaminophen (TYLENOL) 325 MG tablet Take 650 mg by mouth every 6 (six) hours as needed for moderate pain.     . furosemide (LASIX) 20 MG tablet Take 1 tablet (20 mg total) by mouth daily. 30 tablet 3  . hydrochlorothiazide (HYDRODIURIL) 25 MG tablet Take by mouth.    . lipase/protease/amylase (CREON) 36000 UNITS CPEP capsule Take 1 capsule (36,000 Units total) by mouth 3 (three) times daily with meals. 270 capsule 1  . losartan (COZAAR) 100 MG tablet     . metoprolol succinate (TOPROL-XL) 50 MG 24 hr tablet Take 25 mg by mouth daily before breakfast. Take with or immediately following a meal.    . nystatin cream (MYCOSTATIN)     . omeprazole (PRILOSEC) 20 MG capsule Take 20 mg by mouth daily.     Alveda Reasons 20 MG TABS tablet      No current facility-administered medications  for this visit.     Review of Systems  Constitutional: Negative.   HENT: Negative.   Eyes: Negative.   Respiratory: Negative.   Cardiovascular: Negative.   Gastrointestinal: Negative for abdominal pain, constipation, nausea and vomiting.  Genitourinary: Negative.   Musculoskeletal: Positive for back pain.       Mild back pain in lower right side.  Skin: Negative.   Neurological: Negative.   Endo/Heme/Allergies: Negative.   Psychiatric/Behavioral: Negative.  The patient is not nervous/anxious.   All other systems reviewed and are negative.  14 point ROS was done and is otherwise as detailed above or in HPI    PHYSICAL EXAMINATION: ECOG PERFORMANCE STATUS: 1 - Symptomatic but completely ambulatory  Vitals:   10/01/15 1352  BP: (!) 127/58  Pulse: 66  Resp: 20  Temp: 98.3  F (36.8 C)   Filed Weights   10/01/15 1352  Weight: 129 lb (58.5 kg)     Physical Exam  Constitutional: She is oriented to person, place, and time and well-developed, well-nourished, and in no distress. No distress.  Well groomed  HENT:  Head: Normocephalic and atraumatic.  Mouth/Throat: Oropharynx is clear and moist. No oropharyngeal exudate.  Eyes: Conjunctivae and EOM are normal. Pupils are equal, round, and reactive to light. Right eye exhibits no discharge. Left eye exhibits no discharge. No scleral icterus.  Neck: Normal range of motion. Neck supple. No tracheal deviation present. No thyromegaly present.  Cardiovascular: Normal rate, regular rhythm and intact distal pulses.   No murmur heard. Pulmonary/Chest: Effort normal and breath sounds normal.  History of emphysema.  Abdominal: Soft. Bowel sounds are normal. She exhibits no distension and no mass. There is no tenderness. There is no rebound and no guarding.  Large midline incisional hernia, central abdomen.  Musculoskeletal: Normal range of motion. She exhibits no edema or tenderness.  Lymphadenopathy:    She has no cervical adenopathy.  Neurological: She is alert and oriented to person, place, and time. Gait normal. Coordination normal.  Skin: Skin is warm and dry. She is not diaphoretic.  Psychiatric: Mood, memory, affect and judgment normal.  Nursing note and vitals reviewed.   LABORATORY DATA:  I have reviewed the data as listed    CMP     Component Value Date/Time   NA 138 04/24/2015 0430   K 3.7 04/24/2015 0430   CL 103 04/24/2015 0430   CO2 25 04/24/2015 0430   GLUCOSE 89 04/24/2015 0430   BUN 14 04/24/2015 0430   CREATININE 1.30 (H) 09/29/2015 1234   CALCIUM 8.8 (L) 04/24/2015 0430   PROT 4.9 (L) 04/22/2015 0403   ALBUMIN 1.8 (L) 04/22/2015 0403   AST 20 04/22/2015 0403   ALT 19 04/22/2015 0403   ALKPHOS 68 04/22/2015 0403   BILITOT 0.5 04/22/2015 0403   GFRNONAA 57 (L) 04/24/2015 0430    GFRAA >60 04/24/2015 0430       PATHOLOGY     2012                  RADIOGRAPHIC STUDIES: I have personally reviewed the radiological images as listed and agreed with the findings in the report.  CT Chest W Contrast (Accession [id number]) (Order [id number])  Imaging  Date: 09/29/2015 Department: [facility] Released By: Georgianne Fick Authorizing: Patrici Ranks, MD  PACS Images   Show images for CT Chest W Contrast  Study Result   CLINICAL DATA:  Subsequent evaluation of a 75 year old female with history of stage  IV pancreatic cancer with metastatic disease to both lungs. Originally diagnosed in 2010 status post surgical resection, chemotherapy and radiation therapy. No current complaints.  EXAM: CT CHEST, ABDOMEN, AND PELVIS WITH CONTRAST  TECHNIQUE: Multidetector CT imaging of the chest, abdomen and pelvis was performed following the standard protocol during bolus administration of intravenous contrast.  CONTRAST:  173m ISOVUE-300 IOPAMIDOL (ISOVUE-300) INJECTION 61%  COMPARISON:  Multiple priors, most recently CT of the chest, abdomen and pelvis 07/29/2015.  FINDINGS: CT CHEST FINDINGS  Cardiovascular: Heart size is borderline enlarged. There is no significant pericardial fluid, thickening or pericardial calcification. There is aortic atherosclerosis, as well as atherosclerosis of the great vessels of the mediastinum and the coronary arteries, including calcified atherosclerotic plaque in the left main, left anterior descending, left circumflex and right coronary arteries. Faint calcifications of the aortic valve. Right internal jugular single-lumen porta cath with tip terminating in the distal superior vena cava.  Mediastinum/Nodes: No pathologically enlarged mediastinal or hilar lymph nodes. 1.8 cm low-attenuation nodule in the right lobe of the thyroid gland is similar to prior studies, and nonspecific, but likely  benign. Esophagus is normal in appearance. No axillary lymphadenopathy.  Lungs/Pleura: Again noted are multifocal highly irregular areas of nodular and mass-like appearing architectural distortion in the lungs bilaterally. These are most evident throughout the mid to lower lungs, and presumably reflect widespread metastatic disease. The largest of these lesions is a pleural-based cavitary mass in the posterior aspect of the right lower lobe (image 113 of series 6) which has enlarged in size and currently measures 6.9 x 4.0 cm. The other smaller lesions are are generally stable to slightly increased in size compared to the prior study, although direct comparison between examinations is challenging due to the highly irregular shape of these lesions. No new pulmonary lesions are noted. Postoperative changes of wedge resection are noted in the right upper lobe. Mild diffuse bronchial wall thickening with severe centrilobular and paraseptal emphysema. No acute consolidative airspace disease. No pleural effusions.  Musculoskeletal: Lytic lesion in the anterolateral aspect of the left fifth rib where there is a pathologic fracture (image 77 of series 6). Multiple other old and healing left-sided rib fractures are noted.  CT ABDOMEN PELVIS FINDINGS  Hepatobiliary: Pneumobilia, presumably related to prior sphincterotomy. No suspicious cystic or solid hepatic lesions. Status post cholecystectomy.  Pancreas: Postoperative changes of prior Whipple procedure are again noted. Gas is noted in the main pancreatic duct.  Spleen: Unremarkable.  Adrenals/Urinary Tract: 1.8 x 1.5 cm right adrenal nodule is similar to prior examinations, and was not hypermetabolic on prior PET-CT 016/11/9602 presumably a benign lesion such as an adenoma. Mild adrenal thickening of the left adrenal gland is unchanged. Right kidney is normal in appearance. 1.7 cm simple cyst in the lower pole of the left  kidney. No hydroureteronephrosis. Urinary bladder is normal in appearance.  Stomach/Bowel: Postoperative changes related to prior Whipple procedure are again noted. Ventral hernia containing short segments of small bowel in the epigastric region. There is some mild dilatation of a loop of small bowel adjacent to the ventral hernia in the epigastric region (image 61 of series 2) which measures up to 4.4 cm in diameter. However, there is no other pathologic dilatation of small bowel or colon to suggest frank bowel obstruction at this time. Normal appendix.  Vascular/Lymphatic: Fusiform aneurysmal dilatation of the infrarenal abdominal aorta which measures up to 3.9 x 3.5 cm (image 68 of series 2), similar to prior examinations. Prominent soft tissue adjacent  to the proximal superior mesenteric artery and in the aortocaval region (axial images 60-63 of series 2) measuring up to 2.5 x 1.8 cm, likely to represent treated lymphadenopathy. No other lymphadenopathy noted elsewhere in the abdomen or pelvis.  Reproductive: Status post hysterectomy. Ovaries are not confidently identified may be surgically absent or atrophic.  Other: No significant volume of ascites.  No pneumoperitoneum.  Musculoskeletal: There are no aggressive appearing lytic or blastic lesions noted in the visualized portions of the skeleton.  IMPRESSION: 1. Slight progression of multifocal cavitary pulmonary metastases, as discussed above. 2. Interval development of a lytic lesion in the antral lateral aspect of the left fifth rib where there is a nondisplaced pathologic fracture. No other definite osseous metastases are identified. 3. Prominent soft tissue adjacent to the proximal superior mesenteric artery and in the aortocaval nodal station, similar to prior examinations, likely to represent treated metastatic lymphadenopathy. 4. Epigastric ventral hernia containing several short loops of small bowel. There  is a focally dilated loop of small bowel adjacent to this, however, this is an isolated finding. No other dilatation of small bowel or colon to suggest frank bowel obstruction at this time. 5. Aortic atherosclerosis, in addition to left main and 3 vessel coronary artery disease. In addition, there is a 3.9 x 3.5 cm fusiform infrarenal abdominal aortic aneurysm which is similar to prior studies. Recommend followup by ultrasound in 2 years. This recommendation follows ACR consensus guidelines: White Paper of the ACR Incidental Findings Committee II on Vascular Findings. J Am Coll Radiol 2013; 10:789-794. 6. Additional incidental findings, as above.   Electronically Signed   By: Vinnie Langton M.D.   On: 09/29/2015 16:11      ASSESSMENT & PLAN:  Stage IV Pancreatic Adenocarcinoma Lung Adenocarcinoma RIJ thrombosis Transverse Sinus Thrombosis Chronic Anticoagulation with lovenox L pneumothorax, spontaneous Malignant Pleural Effusion, Talc Pleurodesis Preserved expression of major/minor MMR proteins done on 2017 LUL biopsy by Dr. Prescott Gum Dyspnea  Complicated course but she has certainly done well for many years. She and her sister are not unrealistic. She has a HCPOA and living will in place. We discussed that although her pancreatic cancer is PDL1 positive this unfortunately does not translate into benefit from immunotherapy, MSI - high status does and unfortunately, her disease is MSI - stable. .  We reviewed her scans on June 7th, discussing how there has been minimal change on her scans. However her CA 19-9 level is measurable and rising, which is most likely evidence that her pancreatic cancer is progressing. (especially given recent surgery and path with Dr. Prescott Gum)   Her worsening SOB is multifactorial, she has had multiple wedge resections, underlying COPD, recent spontaneous pneumothorax, malignant pleural effusion with talc pleurodesis. I also suspect slowly  progressive disease.   Given limited options available to her for her pancreatic adenocarcinoma and her reported difficulties with therapy; I would recommend repeating imaging in approximately 8 weeks. If further progression she may be eligible for  irinotecan-based therapy for her pancreatic cancer. We may also consider trying her on XELODA. I can certainly discuss with Dr. Benay Spice his thoughts as well.   She does not need any refills today.  She knows that, if she doesn't feel well in the meantime, if her cough gets any worse, she can call in and see [place] here.   ASSESSMENT & PLAN (10/01/15):   -During today's appointment, we reviewed her recent scan in depth, covering the progression of her disease. Her bilateral lung lesions  have gotten a little bit bigger, showing some progression. There is also a new lesion on her left rib, which could also be a metastatic site. The soft tissue in the belly, mesenteric arteries in the belly, lymph nodes, are showing that they're stable/a bit better from prior treatment.  I showed the images to her on the scan and explained the findings.   We discussed that Dr. Whitney Muse has had quite a bit of time to go over all of Ms. Bracco's pathology and treatment options with a fine toothed comb. Dr. Whitney Muse would like to treat Ms. Soule, likely not with immunotherapy, but other chemotherapy options. At the very least, further discussion is advisable, and it may be worth having a discussion with Dr. Learta Codding in [place] in order to talk about best options moving forward. I encourage the patient to consider all of her options, to obtain the most information possible to make the best decision going forward. The patient reassures [place] that she trusts her doctors' judgement about what is best proceeding forward in her case.  The patient was advised that, overall, given the results of her scans, further treatment is reasonable at this time.  -We will obtain some bloodwork  today, to check on her  tumor marker levels before any future treatment.  -She will follow up with Dr. Whitney Muse in about 2 weeks to further discuss her treatment options at that time.   ADDENDUM:   Her CA 19-9 has nearly tripled since 06/2015. Historical CA 19-9 values are below.  This is very concerning for continued progression of her pancreatic cancer.  She will return to clinic in 2 weeks to discuss with Dr. Whitney Muse.  I will give the patient a call to let her know about these most recent lab results. (gwd-10/02/15)    A total of 30 minutes was spent in the face-to-face care of this patient with greater than 50% of that time spent in counseling & care-coordination.    Orders Placed This Encounter  Procedures  . Cancer antigen 19-9    Standing Status:   Future    Number of Occurrences:   1    Standing Expiration Date:   09/30/2016  . Comprehensive metabolic panel    Standing Status:   Future    Number of Occurrences:   1    Standing Expiration Date:   09/30/2016  . CBC with Differential    Standing Status:   Future    Number of Occurrences:   1    Standing Expiration Date:   09/30/2016   All questions were answered. The patient knows to call the clinic with any problems, questions or concerns.  This document serves as a record of services personally performed by Mike Craze, NP. It was created on her behalf by Toni Amend, a trained medical scribe. The creation of this record is based on the scribe's personal observations and the provider's statements to them. This document has been checked and approved by the attending provider.  I have reviewed the above documentation for accuracy and completeness and I agree with the above.  This note was electronically signed.    Mike Craze, NP  10/01/2015 3:30 PM

## 2015-10-01 NOTE — Patient Instructions (Signed)
[place] at [facility] Discharge Instructions  RECOMMENDATIONS MADE BY THE CONSULTANT AND ANY TEST RESULTS WILL BE SENT TO YOUR REFERRING PHYSICIAN.  You saw Mike Craze, NP, today. Return for follow up in 10-14 days. Labs today. We will call you with results.  Thank you for choosing [place] at [facility] to provide your oncology and hematology care.  To afford each patient quality time with our provider, please arrive at least 15 minutes before your scheduled appointment time.   Beginning January 23rd 2017 lab work for the [facility] will be done in the  Main lab at [facility] on 1st floor. If you have a lab appointment with the [facility] please come in thru the  Main Entrance and check in at the main information desk  You need to re-schedule your appointment should you arrive 10 or more minutes late.  We strive to give you quality time with our providers, and arriving late affects you and other patients whose appointments are after yours.  Also, if you no show three or more times for appointments you may be dismissed from the clinic at the providers discretion.     Again, thank you for choosing [facility].  Our hope is that these requests will decrease the amount of time that you wait before being seen by our physicians.       _____________________________________________________________  Should you have questions after your visit to [facility], please contact our office at [phone] between the hours of 8:30 a.m. and 4:30 p.m.  Voicemails left after 4:30 p.m. will not be returned until the following business day.  For prescription refill requests, have your pharmacy contact our office.         Resources For Cancer Patients and their Caregivers ? American Cancer Society: Can assist with transportation, wigs, general needs, runs Look Good Feel Better.        604-044-2319 ? Cancer  Care: Provides financial assistance, online support groups, medication/co-pay assistance.  1-800-813-HOPE 989-344-0033) ? [place] Assists [place] cancer patients and their families through emotional , educational and financial support.  534-520-8273 ? [place] DSS Where to apply for food stamps, Medicaid and utility assistance. 567-022-5754 ? [facility]: Transportation to medical appointments. 510-453-7222 ? Social Security Administration: May apply for disability if have a Stage IV cancer. 713-366-1591 9896273144 ? [employer], Disability and Transit Services: Assists with nutrition, care and transit needs. [place] Support Programs: '@10RELATIVEDAYS'$ @ > Cancer Support Group  2nd Tuesday of the month 1pm-2pm, Journey Room  > Creative Journey  3rd Tuesday of the month 1130am-1pm, Journey Room  > Look Good Feel Better  1st Wednesday of the month 10am-12 noon, Journey Room (Call [place] to register [phone])

## 2015-10-02 ENCOUNTER — Telehealth: Payer: Self-pay | Admitting: Adult Health

## 2015-10-02 LAB — CANCER ANTIGEN 19-9: CA 19-9: 940 U/mL — ABNORMAL HIGH (ref 0–35)

## 2015-10-02 NOTE — Telephone Encounter (Signed)
I called [name] [name] to make her aware of her elevated CA 19-9. I shared that this is consistent with her recent imaging showing progression of her pancreatic cancer.  She voiced understanding and will return to the cancer center soon to have further treatment discussions with Dr. Whitney Muse.  She was encouraged to ask questions and all questions were answered to her stated satisfaction.    Mike Craze, NP

## 2015-10-07 ENCOUNTER — Encounter: Payer: Self-pay | Admitting: *Deleted

## 2015-10-14 ENCOUNTER — Encounter (HOSPITAL_COMMUNITY): Payer: Medicare Other

## 2015-10-14 ENCOUNTER — Encounter (HOSPITAL_BASED_OUTPATIENT_CLINIC_OR_DEPARTMENT_OTHER): Payer: Medicare Other | Admitting: Hematology & Oncology

## 2015-10-14 VITALS — BP 122/56 | HR 64 | Temp 98.1°F | Resp 16 | Wt 129.6 lb

## 2015-10-14 DIAGNOSIS — R197 Diarrhea, unspecified: Secondary | ICD-10-CM | POA: Diagnosis not present

## 2015-10-14 DIAGNOSIS — C7802 Secondary malignant neoplasm of left lung: Secondary | ICD-10-CM | POA: Diagnosis not present

## 2015-10-14 DIAGNOSIS — Z95828 Presence of other vascular implants and grafts: Secondary | ICD-10-CM

## 2015-10-14 DIAGNOSIS — C259 Malignant neoplasm of pancreas, unspecified: Secondary | ICD-10-CM

## 2015-10-14 DIAGNOSIS — C7800 Secondary malignant neoplasm of unspecified lung: Secondary | ICD-10-CM

## 2015-10-14 DIAGNOSIS — I82C11 Acute embolism and thrombosis of right internal jugular vein: Secondary | ICD-10-CM | POA: Diagnosis not present

## 2015-10-14 DIAGNOSIS — Z7901 Long term (current) use of anticoagulants: Secondary | ICD-10-CM

## 2015-10-14 DIAGNOSIS — J910 Malignant pleural effusion: Secondary | ICD-10-CM | POA: Diagnosis not present

## 2015-10-14 DIAGNOSIS — R0600 Dyspnea, unspecified: Secondary | ICD-10-CM

## 2015-10-14 LAB — COMPREHENSIVE METABOLIC PANEL
ALK PHOS: 117 U/L (ref 38–126)
ALT: 20 U/L (ref 14–54)
AST: 25 U/L (ref 15–41)
Albumin: 4 g/dL (ref 3.5–5.0)
Anion gap: 4 — ABNORMAL LOW (ref 5–15)
BILIRUBIN TOTAL: 0.7 mg/dL (ref 0.3–1.2)
BUN: 26 mg/dL — ABNORMAL HIGH (ref 6–20)
CALCIUM: 9 mg/dL (ref 8.9–10.3)
CO2: 19 mmol/L — AB (ref 22–32)
CREATININE: 0.98 mg/dL (ref 0.44–1.00)
Chloride: 112 mmol/L — ABNORMAL HIGH (ref 101–111)
GFR calc non Af Amer: 54 mL/min — ABNORMAL LOW (ref >60)
Glucose, Bld: 88 mg/dL (ref 65–99)
Potassium: 4.4 mmol/L (ref 3.5–5.1)
SODIUM: 135 mmol/L (ref 135–145)
Total Protein: 6.8 g/dL (ref 6.5–8.1)

## 2015-10-14 LAB — CBC WITH DIFFERENTIAL/PLATELET
Basophils Absolute: 0 10*3/uL (ref 0.0–0.1)
Basophils Relative: 0 %
EOS PCT: 1 %
Eosinophils Absolute: 0.1 10*3/uL (ref 0.0–0.7)
HEMATOCRIT: 31.5 % — AB (ref 36.0–46.0)
Hemoglobin: 10.3 g/dL — ABNORMAL LOW (ref 12.0–15.0)
LYMPHS PCT: 19 %
Lymphs Abs: 1.7 10*3/uL (ref 0.7–4.0)
MCH: 30.3 pg (ref 26.0–34.0)
MCHC: 32.7 g/dL (ref 30.0–36.0)
MCV: 92.6 fL (ref 78.0–100.0)
MONO ABS: 0.7 10*3/uL (ref 0.1–1.0)
MONOS PCT: 8 %
NEUTROS ABS: 6.7 10*3/uL (ref 1.7–7.7)
Neutrophils Relative %: 72 %
Platelets: 131 10*3/uL — ABNORMAL LOW (ref 150–400)
RBC: 3.4 MIL/uL — ABNORMAL LOW (ref 3.87–5.11)
RDW: 13.8 % (ref 11.5–15.5)
WBC: 9.2 10*3/uL (ref 4.0–10.5)

## 2015-10-14 MED ORDER — LIDOCAINE-PRILOCAINE 2.5-2.5 % EX CREA: TOPICAL_CREAM | CUTANEOUS | 3 refills | Status: DC

## 2015-10-14 MED ORDER — ONDANSETRON HCL 8 MG PO TABS: 8.0000 mg | ORAL_TABLET | Freq: Three times a day (TID) | ORAL | 2 refills | Status: DC | PRN

## 2015-10-14 MED ORDER — DIPHENOXYLATE-ATROPINE 2.5-0.025 MG PO TABS: 1.0000 | ORAL_TABLET | Freq: Four times a day (QID) | ORAL | 0 refills | Status: DC | PRN

## 2015-10-14 MED ORDER — LOPERAMIDE HCL 2 MG PO TABS: ORAL_TABLET | ORAL | 1 refills | Status: DC

## 2015-10-14 MED ORDER — PROCHLORPERAZINE MALEATE 10 MG PO TABS: 10.0000 mg | ORAL_TABLET | Freq: Four times a day (QID) | ORAL | 2 refills | Status: DC | PRN

## 2015-10-14 MED ORDER — HEPARIN SOD (PORK) LOCK FLUSH 100 UNIT/ML IV SOLN
500.0000 [IU] | Freq: Once | INTRAVENOUS | Status: AC
  Administered 2015-10-14: 500 [IU] via INTRAVENOUS

## 2015-10-14 MED ORDER — SODIUM CHLORIDE 0.9% FLUSH
10.0000 mL | INTRAVENOUS | Status: DC | PRN
  Administered 2015-10-14: 10 mL via INTRAVENOUS
  Filled 2015-10-14: qty 10

## 2015-10-14 NOTE — Progress Notes (Signed)
[facility] [facility]  Progress Note  Patient Care Team: Monico Blitz, MD as PCP - General  CHIEF COMPLAINTS:    Pancreatic cancer metastasized to lung [facility])   09/03/2008 Surgery    Whipple with Dr. Birdie Sons for T3N1 3/12 LN+ Pancreatic Adenocarcinoma      11/13/2008 - 09/12/2009 Chemotherapy    Adjuvant Gemcitabine X 6 months      08/31/2009 Surgery    Metastectomy with Dr. Arlyce Dice RUL 2 nodules, RLL (wedge resection X 2)      08/31/2009 Pathology Results    Metastatic adenocarcinoma TTF-1 negative      06/03/2010 Surgery    Left video-assisted thoracoscopic surgery, resection of left lower lobe lesion. with Dr. Arlyce Dice      06/04/2010 Pathology Results    adenocarcinoma positive for cytokeratin 7, focally positive for CDX2, with scattered staining for TTF1 cytokeratin 20. [facility] opinion felt to be lung primary although noted to stain for GI markers, not felt to be pancreatic primary. EGFR and ALK negative      03/31/2011 - 09/04/2011 Radiation Therapy    SBRT LUL 18Gy Dr. Tammi Klippel      03/15/2012 - 06/13/2012 Chemotherapy    5-FU based chemotherapy      02/13/2013 - 05/20/2013 Chemotherapy    Gemzar Abraxane for metastatic disease (given to cover both primaries at [facility])      07/15/2014 PET scan    Mild progression of pulm mets, hypermet mesenteric tumor assoc with  SMA is stable to slightly increased in degree of FDG uptake      07/23/2014 Imaging         07/30/2014 - 09/10/2014 Chemotherapy    Gemzar abraxane stopped due to poor tolerance      04/06/2015 Procedure    L 20 French chest tube for spontaneous pneumothorax, by Dr. Tharon Aquas Trigt      04/20/2015 Pathology Results    High grade carcinoma c/w patient's known pancreatic primary (secondary opinion at [facility]) PDL-1 high expression, preserved major and minor MMR, MSI stable, [facility] ONE testing performed,       04/20/2015 Surgery    Left VATS, mini thoracotomy with stapling  and oversewing of blebs, Talc pleurodesis. performed secondary to persistent air leak not improved with chest tube drainage      07/29/2015 Imaging    No signif change in appear of multifocal B cavitary pulmonary lesions, postop appearance upper abdomen, no findings of met disease to abd or pelvis,      09/29/2015 Imaging    Slight progression of multifocal cavitary pulmonary metastases, as discussed above. 2. Interval development of a lytic lesion in the antral lateral aspect of the left fifth rib where there is a nondisplaced pathologic fracture. No other definite osseous metastases are identified. 3. Prominent soft tissue adjacent to the proximal superior mesenteric artery and in the aortocaval nodal station, similar to prior examinations, likely to represent treated metastatic lymphadenopathy. 4. Epigastric ventral hernia containing several short loops of small bowel. There is a focally dilated loop of small bowel adjacent to this, however, this is an isolated finding. No other dilatation of small bowel or colon to suggest frank bowel obstruction at this time.                   HISTORY OF PRESENTING ILLNESS:  [name] [name] 75 y.o. female is here because of a history of stage IV pancreatic carcinoma, she is also reported to have a history of  stage IV BAC of the lungs. She is here today accompanied by her sister, [name] [name].  The patient is here for further discussion of treatment options. She was seen last by Vinnie Langton and is aware of progression of disease on her most recent CT imaging.   Daily she needs to move her bowels immediately after she eats. She does this two or three times a day and will take an imodium to manage it. For instance, if she goes out with friends then she will preemptively take an imodium to avoid frequent bowel movements. This is her normal. Appetite is marginal. This is also unchanged.   She is interested in trying another therapy if possible. She notes  that QOL is important and if it takes away from her QOL, she will ultimately decide to stop.    MEDICAL HISTORY:  Past Medical History:  Diagnosis Date  . Abdominal wall hernia   . Aortic aneurysm (HCC)    small aortic aneurysm  . Cancer [facility]) July 2010   Pancreatic adenocarcinoma  . Cataract 07/18/08   b/l surgery  . Cervical cancer (HCC) 1963  . COPD (chronic obstructive pulmonary disease) (HCC)   . Emphysema   . Hypercholesterolemia   . Hypertension     SURGICAL HISTORY: Past Surgical History:  Procedure Laterality Date  . BREAST SURGERY  1988   Right breast abcess  . BUNIONECTOMY  07-18-05  . CHOLECYSTECTOMY  July 2010   done w/ Whipple procedure  . EYE SURGERY  2007/07/19   Bilateral cataract  . STAPLING OF BLEBS Left 04/20/2015   Procedure: STAPLING OF BLEBS;  Surgeon: Kerin Perna, MD;  Location: [facility] OR;  Service: Thoracic;  Laterality: Left;  . TONSILLECTOMY  1943  . VAGINAL HYSTERECTOMY  1963   Cervical Cancer  . VIDEO ASSISTED THORACOSCOPY Left 04/20/2015   Procedure: VIDEO ASSISTED THORACOSCOPY;  Surgeon: Kerin Perna, MD;  Location: [facility] OR;  Service: Thoracic;  Laterality: Left;  . WHIPPLE PROCEDURE  July 2010   pancreatic adenocarcinoma    SOCIAL HISTORY: Social History   Social History  . Marital status: Widowed    Spouse name: N/A  . Number of children: 0  . Years of education: N/A   Occupational History  .  Retired   Social History Main Topics  . Smoking status: Former Smoker    Packs/day: 1.00    Years: 30.00    Types: Cigarettes    Quit date: 02/29/1996  . Smokeless tobacco: Never Used  . Alcohol use No  . Drug use: No  . Sexual activity: No   Other Topics Concern  . Not on file   Social History Narrative  . No narrative on file   Widowed; 5 years ago in December. Married 32 years, no children. Had cervical cancer when she was 21 Pancreatic cancer in July of 2010. Worked at [employer] for 37 years; also worked i Armed forces operational officer, at [employer],  Catering manager. Smoked, but quit 15-20 years ago.  Enjoys quilting, crocheting, knitting. Has nieces and nephews.  Was born in [place].  FAMILY HISTORY: Family History  Problem Relation Age of Onset  . Pancreatic cancer Brother   . Colon cancer Sister 33    12/2006 dx surgery and chemotherapy  . Ovarian cancer Sister 71    Ovarian ca,  surgery and chemotherapy    One older sister and one younger sister still living. Four brothers deceased. Daddy died of emphysema back in 07/18/1973 at the age of 64-68 Mother was 25 when  she died in 1987-04-09. Had 4 brothers, died from 04-08-1998 to 04-08-2006 Heart trouble, emphysema & dialysis ("he just had everything"). One brother had lung disease. One brother had pancreatic cancer and lived less than 2 months. [name] has had ovarian and colon cancer. Nephew that died last year with brain cancer. Niece with cancer being treated here.   ALLERGIES:  is allergic to oxaliplatin and aspirin.  MEDICATIONS:  Current Outpatient Prescriptions  Medication Sig Dispense Refill  . fluorouracil CALGB 27062 in sodium chloride 0.9 % 150 mL Inject into the vein. To be given every 14 days over 46 hours.    . hydrochlorothiazide (HYDRODIURIL) 25 MG tablet Take by mouth.    . IRINOTECAN HCL IV Inject into the vein. To be given every 14 days.    Marland Kitchen leucovorin in dextrose 5 % 250 mL Inject into the vein once. To be given every 14 days    . lipase/protease/amylase (CREON) 36000 UNITS CPEP capsule Take 1 capsule (36,000 Units total) by mouth 3 (three) times daily with meals. 270 capsule 1  . losartan (COZAAR) 100 MG tablet     . metoprolol succinate (TOPROL-XL) 50 MG 24 hr tablet Take 25 mg by mouth daily before breakfast. Take with or immediately following a meal.    . nystatin cream (MYCOSTATIN)     . omeprazole (PRILOSEC) 20 MG capsule Take 20 mg by mouth daily.     Alveda Reasons 20 MG TABS tablet     . diphenoxylate-atropine (LOMOTIL) 2.5-0.025 MG tablet Take 1 tablet by mouth 4 (four) times daily as  needed for diarrhea or loose stools. 30 tablet 0  . ibuprofen (ADVIL,MOTRIN) 200 MG tablet Take 400 mg by mouth every 6 (six) hours as needed.    . lidocaine-prilocaine (EMLA) cream Apply a quarter size amount to port site 1 hour prior to chemo. Do not rub in. Cover with plastic wrap. 30 g 3  . loperamide (IMODIUM A-D) 2 MG tablet At the onset of diarrhea, take 2 tabs. Then take 1 tab every 2 hours until you have gone 12 hours without having a loose stool. If it is bedtime and you have a loose stool(s) take 2 tabs every 4 hours until morning. Call [place]. 60 tablet 1  . loperamide (IMODIUM) 2 MG capsule     . ondansetron (ZOFRAN) 8 MG tablet Take 1 tablet (8 mg total) by mouth every 8 (eight) hours as needed for nausea or vomiting. 30 tablet 2  . prochlorperazine (COMPAZINE) 10 MG tablet Take 1 tablet (10 mg total) by mouth every 6 (six) hours as needed for nausea or vomiting. 30 tablet 2   No current facility-administered medications for this visit.     Review of Systems  Constitutional: Negative.   HENT: Negative.   Eyes: Negative.   Cardiovascular: Negative.   Gastrointestinal: Positive for diarrhea. Negative for abdominal pain, constipation, nausea and vomiting.       Bowel movement immediately after eating, managed with imodium.  Genitourinary: Negative.   Musculoskeletal: Positive for back pain.       Mild back pain in lower right side.  Skin: Negative.   Neurological: Negative.   Endo/Heme/Allergies: Negative.   Psychiatric/Behavioral: Negative.   All other systems reviewed and are negative. 14 point ROS was done and is otherwise as detailed above or in HPI   PHYSICAL EXAMINATION: ECOG PERFORMANCE STATUS: 1 - Symptomatic but completely ambulatory  Vitals:   10/14/15 1348  BP: (!) 122/56  Pulse: 64  Resp: 16  Temp: 98.1 F (36.7 C)   Filed Weights   10/14/15 1348  Weight: 129 lb 9.6 oz (58.8 kg)     Physical Exam  Constitutional: She is oriented to person,  place, and time and well-developed, well-nourished, and in no distress. No distress.  Well groomed  HENT:  Head: Normocephalic and atraumatic.  Mouth/Throat: Oropharynx is clear and moist. No oropharyngeal exudate.  Eyes: Conjunctivae and EOM are normal. Pupils are equal, round, and reactive to light. Right eye exhibits no discharge. Left eye exhibits no discharge. No scleral icterus.  Neck: Normal range of motion. Neck supple. No tracheal deviation present. No thyromegaly present.  Cardiovascular: Normal rate, regular rhythm and intact distal pulses.   No murmur heard. Pulmonary/Chest: Effort normal and breath sounds normal.  History of emphysema.  Abdominal: Soft. Bowel sounds are normal. She exhibits no distension and no mass. There is no tenderness. There is no rebound and no guarding.  Large midline incisional hernia, central abdomen.  Musculoskeletal: Normal range of motion. She exhibits no edema or tenderness.  Lymphadenopathy:    She has no cervical adenopathy.  Neurological: She is alert and oriented to person, place, and time. Gait normal. Coordination normal.  Skin: Skin is warm and dry. She is not diaphoretic.  Psychiatric: Mood, memory, affect and judgment normal.  Nursing note and vitals reviewed.   LABORATORY DATA:  I have reviewed the data as listed  Results for HILDEGARD, HLAVAC (MRN [id number]) as of 11/08/2015 14:21  Ref. Range 10/14/2015 16:00  Sodium Latest Ref Range: 135 - 145 mmol/L 135  Potassium Latest Ref Range: 3.5 - 5.1 mmol/L 4.4  Chloride Latest Ref Range: 101 - 111 mmol/L 112 (H)  CO2 Latest Ref Range: 22 - 32 mmol/L 19 (L)  BUN Latest Ref Range: 6 - 20 mg/dL 26 (H)  Creatinine Latest Ref Range: 0.44 - 1.00 mg/dL 0.98  Calcium Latest Ref Range: 8.9 - 10.3 mg/dL 9.0  EGFR (Non-African Amer.) Latest Ref Range: >60 mL/min 54 (L)  EGFR (African American) Latest Ref Range: >60 mL/min >60  Glucose Latest Ref Range: 65 - 99 mg/dL 88  Anion gap Latest Ref  Range: 5 - 15  4 (L)  Alkaline Phosphatase Latest Ref Range: 38 - 126 U/L 117  Albumin Latest Ref Range: 3.5 - 5.0 g/dL 4.0  AST Latest Ref Range: 15 - 41 U/L 25  ALT Latest Ref Range: 14 - 54 U/L 20  Total Protein Latest Ref Range: 6.5 - 8.1 g/dL 6.8  Total Bilirubin Latest Ref Range: 0.3 - 1.2 mg/dL 0.7  WBC Latest Ref Range: 4.0 - 10.5 K/uL 9.2  RBC Latest Ref Range: 3.87 - 5.11 MIL/uL 3.40 (L)  Hemoglobin Latest Ref Range: 12.0 - 15.0 g/dL 10.3 (L)  HCT Latest Ref Range: 36.0 - 46.0 % 31.5 (L)  MCV Latest Ref Range: 78.0 - 100.0 fL 92.6  MCH Latest Ref Range: 26.0 - 34.0 pg 30.3  MCHC Latest Ref Range: 30.0 - 36.0 g/dL 32.7  RDW Latest Ref Range: 11.5 - 15.5 % 13.8  Platelets Latest Ref Range: 150 - 400 K/uL 131 (L)  Neutrophils Latest Units: % 72  Lymphocytes Latest Units: % 19  Monocytes Relative Latest Units: % 8  Eosinophil Latest Units: % 1  Basophil Latest Units: % 0  NEUT# Latest Ref Range: 1.7 - 7.7 K/uL 6.7  Lymphocyte # Latest Ref Range: 0.7 - 4.0 K/uL 1.7  Monocyte # Latest Ref Range: 0.1 - 1.0 K/uL 0.7  Eosinophils Absolute Latest Ref Range: 0.0 - 0.7 K/uL 0.1  Basophils Absolute Latest Ref Range: 0.0 - 0.1 K/uL 0.0      Results for SHELITA, STEPTOE (MRN [id number])   Ref. Range 10/01/2015 15:41 10/14/2015 16:00  CA 19-9 Latest Ref Range: 0 - 35 U/mL 940 (H) 1,134 (H)    PATHOLOGY     2012                  RADIOGRAPHIC STUDIES: I have personally reviewed the radiological images as listed and agreed with the findings in the report. Study Result   CLINICAL DATA:  Subsequent evaluation of a 75 year old female with history of stage IV pancreatic cancer with metastatic disease to both lungs. Originally diagnosed in 2010 status post surgical resection, chemotherapy and radiation therapy. No current complaints.  EXAM: CT CHEST, ABDOMEN, AND PELVIS WITH CONTRAST  TECHNIQUE: Multidetector CT imaging of the chest, abdomen and pelvis  was performed following the standard protocol during bolus administration of intravenous contrast.  CONTRAST:  132m ISOVUE-300 IOPAMIDOL (ISOVUE-300) INJECTION 61%  COMPARISON:  Multiple priors, most recently CT of the chest, abdomen and pelvis 07/29/2015.  FINDINGS: CT CHEST FINDINGS  Cardiovascular: Heart size is borderline enlarged. There is no significant pericardial fluid, thickening or pericardial calcification. There is aortic atherosclerosis, as well as atherosclerosis of the great vessels of the mediastinum and the coronary arteries, including calcified atherosclerotic plaque in the left main, left anterior descending, left circumflex and right coronary arteries. Faint calcifications of the aortic valve. Right internal jugular single-lumen porta cath with tip terminating in the distal superior vena cava.  Mediastinum/Nodes: No pathologically enlarged mediastinal or hilar lymph nodes. 1.8 cm low-attenuation nodule in the right lobe of the thyroid gland is similar to prior studies, and nonspecific, but likely benign. Esophagus is normal in appearance. No axillary lymphadenopathy.  Lungs/Pleura: Again noted are multifocal highly irregular areas of nodular and mass-like appearing architectural distortion in the lungs bilaterally. These are most evident throughout the mid to lower lungs, and presumably reflect widespread metastatic disease. The largest of these lesions is a pleural-based cavitary mass in the posterior aspect of the right lower lobe (image 113 of series 6) which has enlarged in size and currently measures 6.9 x 4.0 cm. The other smaller lesions are are generally stable to slightly increased in size compared to the prior study, although direct comparison between examinations is challenging due to the highly irregular shape of these lesions. No new pulmonary lesions are noted. Postoperative changes of wedge resection are noted in the right upper lobe.  Mild diffuse bronchial wall thickening with severe centrilobular and paraseptal emphysema. No acute consolidative airspace disease. No pleural effusions.  Musculoskeletal: Lytic lesion in the anterolateral aspect of the left fifth rib where there is a pathologic fracture (image 77 of series 6). Multiple other old and healing left-sided rib fractures are noted.  CT ABDOMEN PELVIS FINDINGS  Hepatobiliary: Pneumobilia, presumably related to prior sphincterotomy. No suspicious cystic or solid hepatic lesions. Status post cholecystectomy.  Pancreas: Postoperative changes of prior Whipple procedure are again noted. Gas is noted in the main pancreatic duct.  Spleen: Unremarkable.  Adrenals/Urinary Tract: 1.8 x 1.5 cm right adrenal nodule is similar to prior examinations, and was not hypermetabolic on prior PET-CT 071/24/5809 presumably a benign lesion such as an adenoma. Mild adrenal thickening of the left adrenal gland is unchanged. Right kidney is normal in appearance. 1.7 cm simple cyst in the lower pole of the left kidney. No  hydroureteronephrosis. Urinary bladder is normal in appearance.  Stomach/Bowel: Postoperative changes related to prior Whipple procedure are again noted. Ventral hernia containing short segments of small bowel in the epigastric region. There is some mild dilatation of a loop of small bowel adjacent to the ventral hernia in the epigastric region (image 61 of series 2) which measures up to 4.4 cm in diameter. However, there is no other pathologic dilatation of small bowel or colon to suggest frank bowel obstruction at this time. Normal appendix.  Vascular/Lymphatic: Fusiform aneurysmal dilatation of the infrarenal abdominal aorta which measures up to 3.9 x 3.5 cm (image 68 of series 2), similar to prior examinations. Prominent soft tissue adjacent to the proximal superior mesenteric artery and in the aortocaval region (axial images 60-63 of series 2)  measuring up to 2.5 x 1.8 cm, likely to represent treated lymphadenopathy. No other lymphadenopathy noted elsewhere in the abdomen or pelvis.  Reproductive: Status post hysterectomy. Ovaries are not confidently identified may be surgically absent or atrophic.  Other: No significant volume of ascites.  No pneumoperitoneum.  Musculoskeletal: There are no aggressive appearing lytic or blastic lesions noted in the visualized portions of the skeleton.  IMPRESSION: 1. Slight progression of multifocal cavitary pulmonary metastases, as discussed above. 2. Interval development of a lytic lesion in the antral lateral aspect of the left fifth rib where there is a nondisplaced pathologic fracture. No other definite osseous metastases are identified. 3. Prominent soft tissue adjacent to the proximal superior mesenteric artery and in the aortocaval nodal station, similar to prior examinations, likely to represent treated metastatic lymphadenopathy. 4. Epigastric ventral hernia containing several short loops of small bowel. There is a focally dilated loop of small bowel adjacent to this, however, this is an isolated finding. No other dilatation of small bowel or colon to suggest frank bowel obstruction at this time. 5. Aortic atherosclerosis, in addition to left main and 3 vessel coronary artery disease. In addition, there is a 3.9 x 3.5 cm fusiform infrarenal abdominal aortic aneurysm which is similar to prior studies. Recommend followup by ultrasound in 2 years. This recommendation follows ACR consensus guidelines: White Paper of the ACR Incidental Findings Committee II on Vascular Findings. J Am Coll Radiol 2013; 10:789-794. 6. Additional incidental findings, as above.   Electronically Signed   By: Vinnie Langton M.D.   On: 09/29/2015 16:11      ASSESSMENT & PLAN:  Stage IV Pancreatic Adenocarcinoma Lung Adenocarcinoma RIJ thrombosis Transverse Sinus Thrombosis Chronic  Anticoagulation with lovenox L pneumothorax, spontaneous Malignant Pleural Effusion, Talc Pleurodesis Preserved expression of major/minor MMR proteins done on 2017 LUL biopsy by Dr. Prescott Gum Dyspnea  Complicated course but she has certainly done well for many years. She and her sister are not unrealistic. She has a HCPOA and living will in place. We discussed that although her pancreatic cancer is PDL1 positive this unfortunately does not translate into benefit from immunotherapy, MSI - high status does and unfortunately, her disease is MSI - stable. .  We reviewed her scans, rising CA 19-9. Last treatment option would be with 5-FU and liposomal irinotecan. I would dose reduce her given her past problems with tolerance.  We discussed side effects of this regimen in detail including but not limited to, diarrhea, nausea, vomiting, blood count abnormalities.  She has diarrhea at baseline, managed with imodium. We reviewed management of her diarrhea, ie. Discussed a change from her baseline, she was also given a  lomotil prescription.   We will plan  on her starting treatment next week. She will receive chemotherapy teaching beforehand. I will see her for follow up the week after treatment.   Orders Placed This Encounter  Procedures  . CBC with Differential    Standing Status:   Future    Number of Occurrences:   1    Standing Expiration Date:   10/13/2016  . Comprehensive metabolic panel    Standing Status:   Future    Number of Occurrences:   1    Standing Expiration Date:   10/13/2016  . Cancer antigen 19-9    Standing Status:   Future    Number of Occurrences:   1    Standing Expiration Date:   10/13/2016    All questions were answered. The patient knows to call the clinic with any problems, questions or concerns.  This document serves as a record of services personally performed by Ancil Linsey, MD. It was created on her behalf by Arlyce Harman, a trained medical scribe. The  creation of this record is based on the scribe's personal observations and the provider's statements to them. This document has been checked and approved by the attending provider.  I have reviewed the above documentation for accuracy and completeness and I agree with the above.  This note was electronically signed.    Molli Hazard, MD  11/08/2015 2:15 PM

## 2015-10-14 NOTE — Patient Instructions (Signed)
[place] at [facility] Discharge Instructions  RECOMMENDATIONS MADE BY THE CONSULTANT AND ANY TEST RESULTS WILL BE SENT TO YOUR REFERRING PHYSICIAN.   You had your port flushed today and labs were drawn.  Keep your scheduled appointment.   Thank you for choosing [place] at [facility] to provide your oncology and hematology care.  To afford each patient quality time with our provider, please arrive at least 15 minutes before your scheduled appointment time.   Beginning January 23rd 2017 lab work for the [facility] will be done in the  Main lab at [facility] on 1st floor. If you have a lab appointment with the [facility] please come in thru the  Main Entrance and check in at the main information desk  You need to re-schedule your appointment should you arrive 10 or more minutes late.  We strive to give you quality time with our providers, and arriving late affects you and other patients whose appointments are after yours.  Also, if you no show three or more times for appointments you may be dismissed from the clinic at the providers discretion.     Again, thank you for choosing [facility].  Our hope is that these requests will decrease the amount of time that you wait before being seen by our physicians.       _____________________________________________________________  Should you have questions after your visit to [facility], please contact our office at [phone] between the hours of 8:30 a.m. and 4:30 p.m.  Voicemails left after 4:30 p.m. will not be returned until the following business day.  For prescription refill requests, have your pharmacy contact our office.         Resources For Cancer Patients and their Caregivers ? American Cancer Society: Can assist with transportation, wigs, general needs, runs Look Good Feel Better.        570-152-8663 ? Cancer Care: Provides financial  assistance, online support groups, medication/co-pay assistance.  1-800-813-HOPE 831-541-4593) ? [place] Assists [place] cancer patients and their families through emotional , educational and financial support.  (250)680-2135 ? [place] DSS Where to apply for food stamps, Medicaid and utility assistance. 8207479755 ? RCATS: Transportation to medical appointments. 225-507-3281 ? Social Security Administration: May apply for disability if have a Stage IV cancer. [phone])338-3410 [phone])043-1542 ? [employer], Disability and Transit Services: Assists with nutrition, care and transit needs. [place] Support Programs: '@10RELATIVEDAYS'$ @ > Cancer Support Group  2nd Tuesday of the month 1pm-2pm, Journey Room  > Creative Journey  3rd Tuesday of the month 1130am-1pm, Journey Room  > Look Good Feel Better  1st Wednesday of the month 10am-12 noon, Journey Room (Call [place] to register [phone])

## 2015-10-14 NOTE — Progress Notes (Signed)
[name] [name] presented for Portacath access and flush.  Portacath located right chest wall accessed with  H 20 needle. Good blood return present. Portacath flushed with 57m NS and 500U/529mHeparin and needle removed intact. Procedure without incident. Patient tolerated procedure well.  Labs drawn and sent to the lab.

## 2015-10-14 NOTE — Patient Instructions (Addendum)
[place] at [facility] Discharge Instructions  RECOMMENDATIONS MADE BY THE CONSULTANT AND ANY TEST RESULTS WILL BE SENT TO YOUR REFERRING PHYSICIAN.  You saw Dr. Whitney Muse today. Start Chemo next week with labs. Follow up with Dr. Whitney Muse after chemo. Anderson Malta will fix a teaching packet for you. Port flush today.  Thank you for choosing [place] at [facility] to provide your oncology and hematology care.  To afford each patient quality time with our provider, please arrive at least 15 minutes before your scheduled appointment time.   Beginning January 23rd 2017 lab work for the [facility] will be done in the  Main lab at [facility] on 1st floor. If you have a lab appointment with the [place] please come in thru the  Main Entrance and check in at the main information desk  You need to re-schedule your appointment should you arrive 10 or more minutes late.  We strive to give you quality time with our providers, and arriving late affects you and other patients whose appointments are after yours.  Also, if you no show three or more times for appointments you may be dismissed from the clinic at the providers discretion.     Again, thank you for choosing [facility].  Our hope is that these requests will decrease the amount of time that you wait before being seen by our physicians.       _____________________________________________________________  Should you have questions after your visit to [facility], please contact our office at [phone] between the hours of 8:30 a.m. and 4:30 p.m.  Voicemails left after 4:30 p.m. will not be returned until the following business day.  For prescription refill requests, have your pharmacy contact our office.         Resources For Cancer Patients and their Caregivers ? American Cancer Society: Can assist with transportation, wigs, general needs, runs Look Good  Feel Better.        604-791-3604 ? Cancer Care: Provides financial assistance, online support groups, medication/co-pay assistance.  1-800-813-HOPE (714) 674-3573) ? [place] Assists [place] cancer patients and their families through emotional , educational and financial support.  (408)579-8176 ? [place] DSS Where to apply for food stamps, Medicaid and utility assistance. (858)666-0624 ? RCATS: Transportation to medical appointments. 513-418-4011 ? Social Security Administration: May apply for disability if have a Stage IV cancer. ([phone] 288-1016 (202)415-6129 ? [employer], Disability and Transit Services: Assists with nutrition, care and transit needs. [place] Support Programs: '@10RELATIVEDAYS'$ @ > Cancer Support Group  2nd Tuesday of the month 1pm-2pm, Journey Room  > Creative Journey  3rd Tuesday of the month 1130am-1pm, Journey Room  > Look Good Feel Better  1st Wednesday of the month 10am-12 noon, Journey Room (Call American Cancer Society to register [phone])   [place]   Chemotherapy Chemotherapy is the use of medicines to stop or slow the growth of cancer cells. Depending on the type and stage of your cancer, you may have chemotherapy to:  Cure your cancer.  Slow the progression of your cancer.  Ease your cancer symptoms.  Improve the benefits of radiation treatment.  Shrink a tumor before surgery.  Rid the body of cancer cells that remain after a tumor is surgically removed. HOW IS CHEMOTHERAPY GIVEN? Chemotherapy may be given:  By mouth in liquid or pill form.  Through a thin  tube that is inserted into a vein or artery.  By getting a shot.  By rubbing a cream or ointment on your skin.  Through liquids that are placed directly into various areas of the body, such as the abdomen, chest, or bladder. HOW OFTEN IS CHEMOTHERAPY GIVEN? Chemotherapy may be given  continuously over time, or it may be given in cycles. For example, you may take the medicine for one week out of every month. FOR HOW LONG WILL I NEED CHEMOTHERAPY TREATMENTS? The length of treatment depends on many factors, including:  The type of cancer.  Whether the cancer has spread.  How you respond to the chemotherapy.  Whether you develop side effects. Some types of chemotherapy medicine are given only one time. Others are given for months, years, or for life. WHAT SAFETY PRECAUTIONS MUST I TAKE WHILE ON CHEMOTHERAPY? Chemotherapy medicines are very strong. They will be in all of your bodily fluids, including your urine, stool, saliva, sweat, tears, vaginal secretions, and semen. You must carefully follow some safety precautions to prevent harm to others while you are using these medicines. Here are some recommended precautions:  Make sure that people who help care for you wear disposable gloves if they are going to come into contact with any of your bodily fluids. Women who are pregnant or breastfeeding should not handle any of your bodily fluids.  Wash any clothes, towels, and linens that may have your bodily fluids on them twice in a washing machine using very hot water.  Dispose of adult diapers, tampons, and sanitary napkins by first sealing them in a plastic bag.  Use a condom when having sex for at least 2 weeks after receiving your chemotherapy.  Do not share beverages or food.  Keep your chemotherapy medicines in their original bottles. Keep them in a high, safe location, away from children. Do not expose them to heat or moisture. Do not put them in containers with other types of medicines.  Dispose of all wrappers for your chemotherapy medicines by sealing them in a separate plastic bag.  Do not throw away extra medicine, and do not flush it down the toilet. Take medicine that you are not going to use to your health care provider's office where it can be disposed of  properly.  Follow your health care provider's directions for the proper disposal of needles, IV tubing, and other medical supplies that have come into contact with your chemotherapy medicines.  If you are issued a hazardous waste container, make sure you understand the directions for using it.  Wash your hands thoroughly with warm water and soap after using the bathroom. Dry your hands with disposable paper towels.  When using the toilet:  Flush it twice after each use, including after vomiting.  Close the lid of the toilet prior to flushing. This helps to avoid splashing.  Both men and women should sit to use the toilet. This helps avoid splashing. WHAT ARE THE SIDE EFFECTS OF CHEMOTHERAPY? Side effects depend on a variety of factors, including:  The specific type of chemotherapy medicine used.  The dosage.  How long the medicine is used for.  Your overall health. Some of the side effects you may experience include:  Fatigue and decreased energy.  Decreased appetite.  Changes in your sense of smell or taste.  Nausea.  Vomiting.  Constipation or diarrhea.  Hair loss.  Increased susceptibility to infection.  Easy bleeding.  Mouth sores.  Burning or tingling in the hands or  feet.  Memory problems.   This information is not intended to replace advice given to you by your health care provider. Make sure you discuss any questions you have with your health care provider.   Document Released: 12/05/2006 Document Revised: 02/28/2014 Document Reviewed: 07/16/2013 Elsevier Interactive Patient Education [employer].

## 2015-10-15 LAB — CANCER ANTIGEN 19-9: CA 19 9: 1134 U/mL — AB (ref 0–35)

## 2015-10-15 NOTE — Patient Instructions (Addendum)
[place]   CHEMOTHERAPY INSTRUCTIONS  Premeds: Aloxi - high powered nausea/vomiting prevention medication used for chemotherapy patients. Emend - high powered nausea/vomiting prevention medication used for chemotherapy patients. Dexamethasone - steroid - given to reduce the risk of you having an allergic type reaction to the chemotherapy. Dex can cause you to feel energized, nervous/anxious/jittery, make you have trouble sleeping, and/or make you feel hot/flushed in the face/neck and/or look pink/red in the face/neck. These side effects will pass as the Dex wears off. (takes 20 minutes to infuse)   Irinotecan - diarrhea, bone marrow suppression, hair loss, abdominal cramping. This drug can cause early and late diarrhea. Early diarrhea can occur within 24 hours of administration. We recommend Imodium. For diarrhea, you take Imodium 2 tablets @ the onset of diarrhea, and then 1 tablet every 2 hours until 12 hours have passed without a bowel movement. May take 2 tablets @ bedtime and every 4 hours until morning. If diarrhea recurs repeat. Call [place] and let us know that you are having diarrhea and whether or not the Imodium is working.  (takes 90 minutes to infuse)   Leucovorin - this is a medication that is not chemo but given with chemo. This med "rescues" the healthy cells before we administer the drug 5FU. This makes the 5FU work better. (takes 2 hours to infuse- this goes in @ the same time as the Irinotecan chemo)  5FU: bone marrow suppression (low white blood cells - wbcs fight infection, low red blood cells - rbcs make up your blood, low platelets - this is what makes your blood clot, nausea/vomiting, diarrhea, mouth sores, hair loss, dry skin, ocular toxicities (increased tear production, sensitivity to light). You must wear sunscreen/sunglasses. Cover your skin when out in sunlight. You will get burned very easily. The nurse will connect you to an  ambulatory pump with this medication attached. You will wear the pump for 46 hours. The 5FU chemo will infuse for 46 hours total. Then the pump will be removed.    POTENTIAL SIDE EFFECTS OF TREATMENT: Increased Susceptibility to Infection, Vomiting, Hair Thinning, Changes in Character of Skin and Nails (brittleness, dryness,etc.), Pigment Changes (darkening of nail beds, palms of hands, soles of feet, etc.), Bone Marrow Suppression, Abdominal Cramping, Complete Hair Loss, Nausea, Diarrhea, Sun Sensitivity and Mouth Sores     EDUCATIONAL MATERIALS GIVEN AND REVIEWED: Chemotherapy and You booklet Specific Instructions Sheets: Irinotecan, Leucovorin, 5FU   SELF CARE ACTIVITIES WHILE ON CHEMOTHERAPY: Increase your fluid intake 48 hours prior to treatment and drink at least 2 quarts per day after treatment., No alcohol intake., No aspirin or other medications unless approved by your oncologist., Eat foods that are light and easy to digest., Eat foods at cold or room temperature., No fried, fatty, or spicy foods immediately before or after treatment., Have teeth cleaned professionally before starting treatment. Keep dentures and partial plates clean., Use soft toothbrush and do not use mouthwashes that contain alcohol. Biotene is a good mouthwash that is available at most pharmacies or may be ordered by calling [phone]., Use warm salt water gargles (1 teaspoon salt per 1 quart warm water) before and after meals and at bedtime. Or you may rinse with 2 tablespoons of three -percent hydrogen peroxide mixed in eight ounces of water., Always use sunscreen with SPF (Sun Protection Factor) of 30 or higher., Use your nausea medication as directed to prevent nausea., Use your stool softener or laxative as directed to  prevent constipation. and Use your anti-diarrheal medication as directed to stop diarrhea.  Please wash your hands for at least 30 seconds using warm soapy water. Handwashing is the #1 way to  prevent the spread of germs. Stay away from sick people or people who are getting over a cold. If you develop respiratory systems such as green/yellow mucus production or productive cough or persistent cough let us know and we will see if you need an antibiotic. It is a good idea to keep a pair of gloves on when going into grocery stores/[employer] to decrease your risk of coming into contact with germs on the carts, etc. Carry alcohol hand gel with you at all times and use it frequently if out in public. All foods need to be cooked thoroughly. No raw foods. No medium or undercooked meats, eggs. If your food is cooked medium well, it does not need to be hot pink or saturated with bloody liquid at all. Vegetables and fruits need to be washed/rinsed under the faucet with a dish detergent before being consumed. You can eat raw fruits and vegetables unless we tell you otherwise but it would be best if you cooked them or bought frozen. Do not eat off of salad bars or hot bars unless you really trust the cleanliness of the restaurant. If you need dental work, please let Dr. Whitney Muse know before you go for your appointment so that we can coordinate the best possible time for you in regards to your chemo regimen. You need to also let your dentist know that you are actively taking chemo. We may need to do labs prior to your dental appointment. We also want your bowels moving at least every other day. If this is not happening, we need to know so that we can get you on a bowel regimen to help you go.       MEDICATIONS: You have been given prescriptions for the following medications:  Zofran/Ondansetron '8mg'$  tablet. Take 1 tablet every 8 hours as needed for nausea/vomiting.   Compazine/Prochlorperazine '10mg'$  tablet. Take 1 tablet every 6 hours as needed for nausea/vomiting.   Imodium '2mg'$  tablet. At the onset of diarrhea, take 2 tablets. Then take 1 tablet every 2 hours until you go 12 hours without having a loose stool.  If diarrhea occurs at bedtime, take 2 tablets every 4 hours until morning. Call [place].  EMLA cream. Apply a quarter size amount to port site 1 hour prior to chemo. Do not rub in. Cover with plastic wrap.    SYMPTOMS TO REPORT AS SOON AS POSSIBLE AFTER TREATMENT:  FEVER GREATER THAN 100.5 F  CHILLS WITH OR WITHOUT FEVER  NAUSEA AND VOMITING THAT IS NOT CONTROLLED WITH YOUR NAUSEA MEDICATION  UNUSUAL SHORTNESS OF BREATH  UNUSUAL BRUISING OR BLEEDING  TENDERNESS IN MOUTH AND THROAT WITH OR WITHOUT PRESENCE OF ULCERS  URINARY PROBLEMS  BOWEL PROBLEMS  UNUSUAL RASH    Wear comfortable clothing and clothing appropriate for easy access to any Portacath or PICC line. Let us know if there is anything that we can do to make your therapy better!      I have been informed and understand all of the instructions given to me and have received a copy. I have been instructed to call the clinic [phone] or my family physician as soon as possible for continued medical care, if indicated. I do not have any more questions at this time but understand that I may call the [place]  or the Patient Navigator at [phone] during office hours should I have questions or need assistance in obtaining follow-up care. [facility] Discharge Instructions for Patients Receiving Chemotherapy   Beginning January 23rd 2017 lab work for the [facility] will be done in the  Main lab at [facility] on 1st floor. If you have a lab appointment with the [place] please come in thru the  Main Entrance and check in at the main information desk   Today you received the following chemotherapy agents:  Irinotecan, leucovorin, and 5FU  To help prevent nausea and vomiting after your treatment, we encourage you to take your nausea medication as prescribed.  If you develop nausea and vomiting, or diarrhea that is not controlled by your medication, call the clinic.  The clinic  phone number is [phone]. Office hours are Monday-Friday 8:30am-5:00pm.  BELOW ARE SYMPTOMS THAT SHOULD BE REPORTED IMMEDIATELY:  *FEVER GREATER THAN 101.0 F  *CHILLS WITH OR WITHOUT FEVER  NAUSEA AND VOMITING THAT IS NOT CONTROLLED WITH YOUR NAUSEA MEDICATION  *UNUSUAL SHORTNESS OF BREATH  *UNUSUAL BRUISING OR BLEEDING  TENDERNESS IN MOUTH AND THROAT WITH OR WITHOUT PRESENCE OF ULCERS  *URINARY PROBLEMS  *BOWEL PROBLEMS  UNUSUAL RASH Items with * indicate a potential emergency and should be followed up as soon as possible. If you have an emergency after office hours please contact your primary care physician or go to the nearest emergency department.  Please call the clinic during office hours if you have any questions or concerns.   You may also contact the Patient Navigator at [phone] should you have any questions or need assistance in obtaining follow up care.      Resources For Cancer Patients and their Caregivers ? American Cancer Society: Can assist with transportation, wigs, general needs, runs Look Good Feel Better.        ([phone] ? Cancer Care: Provides financial assistance, online support groups, medication/co-pay assistance.  1-800-813-HOPE 443 008 7406) ? [place] Assists [place] cancer patients and their families through emotional , educational and financial support.  ([phone] ? [place] DSS Where to apply for food stamps, Medicaid and utility assistance. 402-584-9069 ? RCATS: Transportation to medical appointments. 985-583-4502 ? Social Security Administration: May apply for disability if have a Stage IV cancer. [phone] ? [employer], Disability and Transit Services: Assists with nutrition, care and transit needs. [phone]

## 2015-10-16 ENCOUNTER — Other Ambulatory Visit (HOSPITAL_COMMUNITY): Payer: Self-pay | Admitting: Oncology

## 2015-10-19 ENCOUNTER — Encounter (HOSPITAL_COMMUNITY): Payer: Self-pay

## 2015-10-19 ENCOUNTER — Encounter (HOSPITAL_BASED_OUTPATIENT_CLINIC_OR_DEPARTMENT_OTHER): Payer: Medicare Other

## 2015-10-19 VITALS — BP 119/48 | HR 54 | Temp 98.1°F | Resp 18 | Wt 129.2 lb

## 2015-10-19 DIAGNOSIS — C259 Malignant neoplasm of pancreas, unspecified: Secondary | ICD-10-CM | POA: Diagnosis not present

## 2015-10-19 DIAGNOSIS — C7802 Secondary malignant neoplasm of left lung: Secondary | ICD-10-CM | POA: Diagnosis not present

## 2015-10-19 DIAGNOSIS — Z5111 Encounter for antineoplastic chemotherapy: Secondary | ICD-10-CM

## 2015-10-19 DIAGNOSIS — C7800 Secondary malignant neoplasm of unspecified lung: Principal | ICD-10-CM

## 2015-10-19 DIAGNOSIS — Z95828 Presence of other vascular implants and grafts: Secondary | ICD-10-CM | POA: Diagnosis not present

## 2015-10-19 DIAGNOSIS — R197 Diarrhea, unspecified: Secondary | ICD-10-CM | POA: Diagnosis not present

## 2015-10-19 LAB — CBC WITH DIFFERENTIAL/PLATELET
BASOS PCT: 0 %
Basophils Absolute: 0 10*3/uL (ref 0.0–0.1)
EOS PCT: 1 %
Eosinophils Absolute: 0.1 10*3/uL (ref 0.0–0.7)
HEMATOCRIT: 30.8 % — AB (ref 36.0–46.0)
HEMOGLOBIN: 10.1 g/dL — AB (ref 12.0–15.0)
LYMPHS PCT: 20 %
Lymphs Abs: 1.3 10*3/uL (ref 0.7–4.0)
MCH: 30.4 pg (ref 26.0–34.0)
MCHC: 32.8 g/dL (ref 30.0–36.0)
MCV: 92.8 fL (ref 78.0–100.0)
MONOS PCT: 10 %
Monocytes Absolute: 0.6 10*3/uL (ref 0.1–1.0)
NEUTROS ABS: 4.4 10*3/uL (ref 1.7–7.7)
Neutrophils Relative %: 69 %
Platelets: 142 10*3/uL — ABNORMAL LOW (ref 150–400)
RBC: 3.32 MIL/uL — ABNORMAL LOW (ref 3.87–5.11)
RDW: 13.5 % (ref 11.5–15.5)
WBC: 6.4 10*3/uL (ref 4.0–10.5)

## 2015-10-19 LAB — COMPREHENSIVE METABOLIC PANEL
ALK PHOS: 108 U/L (ref 38–126)
ALT: 15 U/L (ref 14–54)
AST: 20 U/L (ref 15–41)
Albumin: 3.8 g/dL (ref 3.5–5.0)
Anion gap: 9 (ref 5–15)
BUN: 26 mg/dL — AB (ref 6–20)
CALCIUM: 9.4 mg/dL (ref 8.9–10.3)
CO2: 18 mmol/L — ABNORMAL LOW (ref 22–32)
CREATININE: 1.3 mg/dL — AB (ref 0.44–1.00)
Chloride: 111 mmol/L (ref 101–111)
GFR, EST AFRICAN AMERICAN: 45 mL/min — AB (ref >60)
GFR, EST NON AFRICAN AMERICAN: 39 mL/min — AB (ref >60)
Glucose, Bld: 97 mg/dL (ref 65–99)
Potassium: 4.3 mmol/L (ref 3.5–5.1)
Sodium: 138 mmol/L (ref 135–145)
Total Bilirubin: 0.7 mg/dL (ref 0.3–1.2)
Total Protein: 6.8 g/dL (ref 6.5–8.1)

## 2015-10-19 MED ORDER — SODIUM CHLORIDE 0.9 % IV SOLN
Freq: Once | INTRAVENOUS | Status: AC
  Administered 2015-10-19: 09:00:00 via INTRAVENOUS

## 2015-10-19 MED ORDER — LEUCOVORIN CALCIUM INJECTION 350 MG
400.0000 mg/m2 | Freq: Once | INTRAMUSCULAR | Status: AC
  Administered 2015-10-19: 652 mg via INTRAVENOUS
  Filled 2015-10-19: qty 32.6

## 2015-10-19 MED ORDER — PROCHLORPERAZINE MALEATE 10 MG PO TABS
10.0000 mg | ORAL_TABLET | Freq: Once | ORAL | Status: AC
  Administered 2015-10-19: 10 mg via ORAL
  Filled 2015-10-19: qty 1

## 2015-10-19 MED ORDER — SODIUM CHLORIDE 0.9% FLUSH: 10.0000 mL | INTRAVENOUS | Status: DC | PRN

## 2015-10-19 MED ORDER — SODIUM CHLORIDE 0.9 % IV SOLN
35.0000 mg/m2 | Freq: Once | INTRAVENOUS | Status: AC
  Administered 2015-10-19: 55.9 mg via INTRAVENOUS
  Filled 2015-10-19: qty 13

## 2015-10-19 MED ORDER — PALONOSETRON HCL INJECTION 0.25 MG/5ML
0.2500 mg | Freq: Once | INTRAVENOUS | Status: AC
  Administered 2015-10-19: 0.25 mg via INTRAVENOUS
  Filled 2015-10-19: qty 5

## 2015-10-19 MED ORDER — FLUOROURACIL CHEMO INJECTION 5 GM/100ML
1800.0000 mg/m2 | INTRAVENOUS | Status: DC
  Administered 2015-10-19: 2950 mg via INTRAVENOUS
  Filled 2015-10-19: qty 23

## 2015-10-19 NOTE — Progress Notes (Signed)
Tolerated tx w/o adverse reaction.  Alert, in no distress.  VSS.  Discharged ambulatory. 

## 2015-10-21 ENCOUNTER — Encounter (HOSPITAL_COMMUNITY): Payer: Self-pay

## 2015-10-21 ENCOUNTER — Encounter (HOSPITAL_BASED_OUTPATIENT_CLINIC_OR_DEPARTMENT_OTHER): Payer: Medicare Other

## 2015-10-21 VITALS — BP 161/62 | HR 72 | Temp 98.3°F | Resp 18

## 2015-10-21 DIAGNOSIS — C259 Malignant neoplasm of pancreas, unspecified: Secondary | ICD-10-CM | POA: Diagnosis not present

## 2015-10-21 DIAGNOSIS — C7802 Secondary malignant neoplasm of left lung: Secondary | ICD-10-CM | POA: Diagnosis not present

## 2015-10-21 DIAGNOSIS — C7800 Secondary malignant neoplasm of unspecified lung: Principal | ICD-10-CM

## 2015-10-21 DIAGNOSIS — Z452 Encounter for adjustment and management of vascular access device: Secondary | ICD-10-CM

## 2015-10-21 MED ORDER — HEPARIN SOD (PORK) LOCK FLUSH 100 UNIT/ML IV SOLN
INTRAVENOUS | Status: AC
  Filled 2015-10-21: qty 5

## 2015-10-21 MED ORDER — SODIUM CHLORIDE 0.9% FLUSH
10.0000 mL | INTRAVENOUS | Status: DC | PRN
  Administered 2015-10-21: 10 mL
  Filled 2015-10-21: qty 10

## 2015-10-21 MED ORDER — HEPARIN SOD (PORK) LOCK FLUSH 100 UNIT/ML IV SOLN
500.0000 [IU] | Freq: Once | INTRAVENOUS | Status: AC | PRN
  Administered 2015-10-21: 500 [IU]

## 2015-10-21 NOTE — Patient Instructions (Signed)
[place] at [facility] Discharge Instructions  RECOMMENDATIONS MADE BY THE CONSULTANT AND ANY TEST RESULTS WILL BE SENT TO YOUR REFERRING PHYSICIAN.  Home infusion pump removal and port flush today. Return as scheduled for office visit and chemotherapy.  Thank you for choosing [place] at [facility] to provide your oncology and hematology care.  To afford each patient quality time with our provider, please arrive at least 15 minutes before your scheduled appointment time.   Beginning January 23rd 2017 lab work for the [facility] will be done in the  Main lab at [facility] on 1st floor. If you have a lab appointment with the [place] please come in thru the  Main Entrance and check in at the main information desk  You need to re-schedule your appointment should you arrive 10 or more minutes late.  We strive to give you quality time with our providers, and arriving late affects you and other patients whose appointments are after yours.  Also, if you no show three or more times for appointments you may be dismissed from the clinic at the providers discretion.     Again, thank you for choosing [facility].  Our hope is that these requests will decrease the amount of time that you wait before being seen by our physicians.       _____________________________________________________________  Should you have questions after your visit to [facility], please contact our office at [phone]) [phone] between the hours of 8:30 a.m. and 4:30 p.m.  Voicemails left after 4:30 p.m. will not be returned until the following business day.  For prescription refill requests, have your pharmacy contact our office.         Resources For Cancer Patients and their Caregivers ? American Cancer Society: Can assist with transportation, wigs, general needs, runs Look Good Feel Better.        510-677-8635 ? Cancer Care: Provides  financial assistance, online support groups, medication/co-pay assistance.  1-800-813-HOPE 918-275-7165) ? [place] Assists [place] cancer patients and their families through emotional , educational and financial support.  639-122-7027 ? [place] DSS Where to apply for food stamps, Medicaid and utility assistance. 571-451-5139 ? RCATS: Transportation to medical appointments. 403-048-4453 ? Social Security Administration: May apply for disability if have a Stage IV cancer. 972-519-9741 774-184-2199 ? [employer], Disability and Transit Services: Assists with nutrition, care and transit needs. [place] Support Programs: '@10RELATIVEDAYS'$ @ > Cancer Support Group  2nd Tuesday of the month 1pm-2pm, Journey Room  > Creative Journey  3rd Tuesday of the month 1130am-1pm, Journey Room  > Look Good Feel Better  1st Wednesday of the month 10am-12 noon, Journey Room (Call [place] to register [phone])

## 2015-10-21 NOTE — Progress Notes (Signed)
2549:  [name] [name] presents to have home infusion pump d/c'd and for port-a-cath deaccess with flush.  Portacath located right chest wall accessed with  H 20 needle.  Good blood return present. Portacath flushed with NS 3m and 500U/545mHeparin, and needle removed intact.  Procedure tolerated well and without incident.

## 2015-10-22 ENCOUNTER — Telehealth (HOSPITAL_COMMUNITY): Payer: Self-pay | Admitting: *Deleted

## 2015-10-22 NOTE — Telephone Encounter (Signed)
Contacted pt for 24 hr f/u post chemo:  Reports some nausea and diarrhea yesterday, but states both are now under control with medication.  Denies any other s/s.  Instructed to call the clinic with any questions or concerns.  Verbalizes understanding.

## 2015-10-27 ENCOUNTER — Encounter (HOSPITAL_COMMUNITY): Payer: Medicare Other | Attending: Hematology & Oncology | Admitting: Hematology & Oncology

## 2015-10-27 ENCOUNTER — Encounter (HOSPITAL_COMMUNITY): Payer: Self-pay | Admitting: Hematology & Oncology

## 2015-10-27 ENCOUNTER — Encounter (HOSPITAL_COMMUNITY): Payer: Medicare Other

## 2015-10-27 VITALS — BP 139/54 | HR 61 | Temp 98.0°F | Resp 16 | Wt 129.8 lb

## 2015-10-27 DIAGNOSIS — C259 Malignant neoplasm of pancreas, unspecified: Secondary | ICD-10-CM

## 2015-10-27 DIAGNOSIS — C7802 Secondary malignant neoplasm of left lung: Secondary | ICD-10-CM

## 2015-10-27 DIAGNOSIS — C7800 Secondary malignant neoplasm of unspecified lung: Secondary | ICD-10-CM | POA: Insufficient documentation

## 2015-10-27 DIAGNOSIS — I82C11 Acute embolism and thrombosis of right internal jugular vein: Secondary | ICD-10-CM | POA: Diagnosis not present

## 2015-10-27 DIAGNOSIS — D649 Anemia, unspecified: Secondary | ICD-10-CM

## 2015-10-27 DIAGNOSIS — D6481 Anemia due to antineoplastic chemotherapy: Secondary | ICD-10-CM

## 2015-10-27 DIAGNOSIS — T451X5A Adverse effect of antineoplastic and immunosuppressive drugs, initial encounter: Secondary | ICD-10-CM

## 2015-10-27 DIAGNOSIS — Z7901 Long term (current) use of anticoagulants: Secondary | ICD-10-CM

## 2015-10-27 DIAGNOSIS — J910 Malignant pleural effusion: Secondary | ICD-10-CM | POA: Diagnosis not present

## 2015-10-27 DIAGNOSIS — R197 Diarrhea, unspecified: Secondary | ICD-10-CM

## 2015-10-27 LAB — CBC WITH DIFFERENTIAL/PLATELET
BASOS ABS: 0 10*3/uL (ref 0.0–0.1)
BASOS PCT: 0 %
EOS PCT: 2 %
Eosinophils Absolute: 0.1 10*3/uL (ref 0.0–0.7)
HEMATOCRIT: 30 % — AB (ref 36.0–46.0)
HEMOGLOBIN: 9.8 g/dL — AB (ref 12.0–15.0)
LYMPHS PCT: 27 %
Lymphs Abs: 1.8 10*3/uL (ref 0.7–4.0)
MCH: 30 pg (ref 26.0–34.0)
MCHC: 32.7 g/dL (ref 30.0–36.0)
MCV: 91.7 fL (ref 78.0–100.0)
MONOS PCT: 4 %
Monocytes Absolute: 0.3 10*3/uL (ref 0.1–1.0)
NEUTROS ABS: 4.3 10*3/uL (ref 1.7–7.7)
Neutrophils Relative %: 67 %
Platelets: 111 10*3/uL — ABNORMAL LOW (ref 150–400)
RBC: 3.27 MIL/uL — ABNORMAL LOW (ref 3.87–5.11)
RDW: 13.1 % (ref 11.5–15.5)
WBC: 6.5 10*3/uL (ref 4.0–10.5)

## 2015-10-27 LAB — COMPREHENSIVE METABOLIC PANEL
ALT: 14 U/L (ref 14–54)
ANION GAP: 6 (ref 5–15)
AST: 18 U/L (ref 15–41)
Albumin: 3.7 g/dL (ref 3.5–5.0)
Alkaline Phosphatase: 111 U/L (ref 38–126)
BUN: 22 mg/dL — ABNORMAL HIGH (ref 6–20)
CHLORIDE: 109 mmol/L (ref 101–111)
CO2: 22 mmol/L (ref 22–32)
Calcium: 9.1 mg/dL (ref 8.9–10.3)
Creatinine, Ser: 1.31 mg/dL — ABNORMAL HIGH (ref 0.44–1.00)
GFR, EST AFRICAN AMERICAN: 44 mL/min — AB (ref >60)
GFR, EST NON AFRICAN AMERICAN: 38 mL/min — AB (ref >60)
Glucose, Bld: 87 mg/dL (ref 65–99)
POTASSIUM: 4.8 mmol/L (ref 3.5–5.1)
SODIUM: 137 mmol/L (ref 135–145)
Total Bilirubin: 0.5 mg/dL (ref 0.3–1.2)
Total Protein: 6.5 g/dL (ref 6.5–8.1)

## 2015-10-27 NOTE — Progress Notes (Signed)
 [facility]  CONSULT NOTE  Patient Care Team: Ashish Shah, MD as PCP - General  CHIEF COMPLAINTS/PURPOSE OF CONSULTATION:    Pancreatic cancer metastasized to lung (HCC)   09/03/2008 Surgery    Whipple with Dr. Russell Howerton for T3N1 3/12 LN+ Pancreatic Adenocarcinoma      11/13/2008 - 09/12/2009 Chemotherapy    Adjuvant Gemcitabine X 6 months      08/31/2009 Surgery    Metastectomy with Dr. Burney RUL 2 nodules, RLL (wedge resection X 2)      08/31/2009 Pathology Results    Metastatic adenocarcinoma TTF-1 negative      06/03/2010 Surgery    Left video-assisted thoracoscopic surgery, resection of left lower lobe lesion. with Dr. Burney      06/04/2010 Pathology Results    adenocarcinoma positive for cytokeratin 7, focally positive for CDX2, with scattered staining for TTF1 cytokeratin 20. [facility] opinion felt to be lung primary although noted to stain for GI markers, not felt to be pancreatic primary. EGFR and ALK negative      03/31/2011 - 09/04/2011 Radiation Therapy    SBRT LUL 18Gy Dr. Manning      03/15/2012 - 06/13/2012 Chemotherapy    5-FU based chemotherapy      02/13/2013 - 05/20/2013 Chemotherapy    Gemzar Abraxane for metastatic disease (given to cover both primaries at [facility])      07/15/2014 PET scan    Mild progression of pulm mets, hypermet mesenteric tumor assoc with  SMA is stable to slightly increased in degree of FDG uptake      07/23/2014 Imaging         07/30/2014 - 09/10/2014 Chemotherapy    Gemzar abraxane stopped due to poor tolerance      04/06/2015 Procedure    L 20 French chest tube for spontaneous pneumothorax, by Dr. Peter Van Trigt      04/20/2015 Pathology Results    High grade carcinoma c/w patient's known pancreatic primary (secondary opinion at [facility]) PDL-1 high expression, preserved major and minor MMR, MSI stable, [facility] ONE testing performed,       04/20/2015 Surgery    Left VATS, mini  thoracotomy with stapling and oversewing of blebs, Talc pleurodesis. performed secondary to persistent air leak not improved with chest tube drainage      07/29/2015 Imaging    No signif change in appear of multifocal B cavitary pulmonary lesions, postop appearance upper abdomen, no findings of met disease to abd or pelvis,      10/19/2015 -  Chemotherapy    The patient had palonosetron (ALOXI) injection 0.25 mg, 0.25 mg, Intravenous,  Once, 1 of 4 cycles  leucovorin 652 mg in dextrose 5 % 250 mL infusion, 400 mg/m2 = 652 mg, Intravenous,  Once, 1 of 4 cycles  fluorouracil (ADRUCIL) 2,950 mg in sodium chloride 0.9 % 91 mL chemo infusion, 1,800 mg/m2 = 2,950 mg (100 % of original dose 1,800 mg/m2), Intravenous, 1 Day/Dose, 1 of 4 cycles Dose modification: 1,920 mg/m2 (original dose 1,800 mg/m2, Cycle 1, Reason: Provider Judgment), 1,800 mg/m2 (original dose 1,800 mg/m2, Cycle 1, Reason: Provider Judgment)  irinotecan LIPOSOME (ONIVYDE) 55.9 mg in sodium chloride 0.9 % 500 mL chemo infusion, 35 mg/m2 = 55.9 mg (100 % of original dose 35 mg/m2), Intravenous, Once, 1 of 4 cycles Dose modification: 35 mg/m2 (original dose 35 mg/m2, Cycle 1, Reason: Provider Judgment)  for chemotherapy treatment.         HISTORY OF PRESENTING   ILLNESS:  [name] [name] 75 y.o. female is here because of a history of stage IV pancreatic carcinoma, she is also reported to have a history of stage IV BAC of the lungs. (On further review I feel she may have had stage I BAC of the lungs)  She is here today accompanied by her sister, [name]. She was treated with cycle #1 of liposomal irinotecan/5-FU.   [name] [name] is accompanied by her sister. She is here for a follow up of stage IV pancreatic carcinoma.   She has been experiencing diarrhea 3-4 times a day. She said yesterday it was exceptionally bad and she had to take three imodium. She says the imodium temporarily helped but then the diarrhea comes back and she needs to take  it again. She last took imodium at 5 pm yesterday and has has not had any diarrhea since then. She reminds me that her baseline is diarrhea but that yesterday was a noticeable change.   She experiences nausea daily but the compazine helps somewhat. The nausea is intermittent.   [name] [name] says she has vomited twice. She notes that the second time was worse than the first time and that she took compazine to help ease the nausea/vomiting.   Patient says she is not eating well at all. She said she has grits and a piece of toast for breakfast and soup or a bowl of jello for lunch. She says her days have been rough but not totally miserable.  The first night she had chemo (Monday) was difficult. She felt like she had the taste of chemo in her throat. She slept in a recliner that night and her sister stayed with her.   When I asked the patient if she feels like continuing treatment would affect her quality of life, she asked me how long she would live without it. She says she wants to try chemo again because she does not feel that it is unbearable.   MEDICAL HISTORY:  Past Medical History:  Diagnosis Date  . Abdominal wall hernia   . Aortic aneurysm (HCC)    small aortic aneurysm  . Cancer (HCC) July 2010   Pancreatic adenocarcinoma  . Cataract 2010   b/l surgery  . Cervical cancer (HCC) 1963  . COPD (chronic obstructive pulmonary disease) (HCC)   . Emphysema   . Hypercholesterolemia   . Hypertension     SURGICAL HISTORY: Past Surgical History:  Procedure Laterality Date  . BREAST SURGERY  1988   Right breast abcess  . BUNIONECTOMY  2007  . CHOLECYSTECTOMY  July 2010   done w/ Whipple procedure  . EYE SURGERY  2009   Bilateral cataract  . STAPLING OF BLEBS Left 04/20/2015   Procedure: STAPLING OF BLEBS;  Surgeon: Peter Van Trigt, MD;  Location: [facility] OR;  Service: Thoracic;  Laterality: Left;  . TONSILLECTOMY  1943  . VAGINAL HYSTERECTOMY  1963   Cervical Cancer  . VIDEO ASSISTED  THORACOSCOPY Left 04/20/2015   Procedure: VIDEO ASSISTED THORACOSCOPY;  Surgeon: Peter Van Trigt, MD;  Location: [facility] OR;  Service: Thoracic;  Laterality: Left;  . WHIPPLE PROCEDURE  July 2010   pancreatic adenocarcinoma    SOCIAL HISTORY: Social History   Social History  . Marital status: Widowed    Spouse name: N/A  . Number of children: 0  . Years of education: N/A   Occupational History  .  Retired   Social History Main Topics  . Smoking status: Former Smoker    Packs/day:   1.00    Years: 30.00    Types: Cigarettes    Quit date: 02/29/1996  . Smokeless tobacco: Never Used  . Alcohol use No  . Drug use: No  . Sexual activity: No   Other Topics Concern  . Not on file   Social History Narrative  . No narrative on file   Widowed; 5 years ago in December. Married 32 years, no children. Had cervical cancer when she was 21 Pancreatic cancer in July of 2010. Worked at [employer] for 37 years; also worked i [employer], at [employer], etc. Smoked, but quit 15-20 years ago.  Enjoys quilting, crocheting, knitting. Has nieces and nephews.  Was born in [place].  FAMILY HISTORY: Family History  Problem Relation Age of Onset  . Pancreatic cancer Brother   . Colon cancer Sister 65    12/2006 dx surgery and chemotherapy  . Ovarian cancer Sister 66    Ovarian ca,  surgery and chemotherapy    One older sister and one younger sister still living. Four brothers deceased. Daddy died of emphysema back in '75 at the age of 66-68 Mother was 76 when she died in '89. Had 4 brothers, died from 2000 to 2008 Heart trouble, emphysema & dialysis ("he just had everything"). One brother had lung disease. One brother had pancreatic cancer and lived less than 2 months. [name] has had ovarian and colon cancer. Nephew that died last year with brain cancer. Niece with cancer being treated here.   ALLERGIES:  is allergic to oxaliplatin and aspirin.  MEDICATIONS:  Current Outpatient Prescriptions    Medication Sig Dispense Refill  . diphenoxylate-atropine (LOMOTIL) 2.5-0.025 MG tablet Take 1 tablet by mouth 4 (four) times daily as needed for diarrhea or loose stools. 30 tablet 0  . fluorouracil CALGB 80702 in sodium chloride 0.9 % 150 mL Inject into the vein. To be given every 14 days over 46 hours.    . hydrochlorothiazide (HYDRODIURIL) 25 MG tablet Take by mouth.    . ibuprofen (ADVIL,MOTRIN) 200 MG tablet Take 400 mg by mouth every 6 (six) hours as needed.    . IRINOTECAN HCL IV Inject into the vein. To be given every 14 days.    . leucovorin in dextrose 5 % 250 mL Inject into the vein once. To be given every 14 days    . lidocaine-prilocaine (EMLA) cream Apply a quarter size amount to port site 1 hour prior to chemo. Do not rub in. Cover with plastic wrap. 30 g 3  . lipase/protease/amylase (CREON) 36000 UNITS CPEP capsule Take 1 capsule (36,000 Units total) by mouth 3 (three) times daily with meals. 270 capsule 1  . loperamide (IMODIUM A-D) 2 MG tablet At the onset of diarrhea, take 2 tabs. Then take 1 tab every 2 hours until you have gone 12 hours without having a loose stool. If it is bedtime and you have a loose stool(s) take 2 tabs every 4 hours until morning. Call Cancer Center. 60 tablet 1  . loperamide (IMODIUM) 2 MG capsule     . losartan (COZAAR) 100 MG tablet     . metoprolol succinate (TOPROL-XL) 50 MG 24 hr tablet Take 25 mg by mouth daily before breakfast. Take with or immediately following a meal.    . nystatin cream (MYCOSTATIN)     . omeprazole (PRILOSEC) 20 MG capsule Take 20 mg by mouth daily.     . ondansetron (ZOFRAN) 8 MG tablet Take 1 tablet (8 mg total) by mouth every 8 (  eight) hours as needed for nausea or vomiting. 30 tablet 2  . prochlorperazine (COMPAZINE) 10 MG tablet Take 1 tablet (10 mg total) by mouth every 6 (six) hours as needed for nausea or vomiting. 30 tablet 2  . XARELTO 20 MG TABS tablet      No current facility-administered medications for this  visit.     Review of Systems  Constitutional: Negative.   HENT: Negative.   Eyes: Negative.   Cardiovascular: Negative.   Gastrointestinal: Positive for diarrhea and nausea. Negative for abdominal pain, constipation and vomiting.       Increase in the number of bowel movements per day.  Genitourinary: Negative.   Skin: Negative.   Neurological: Negative.   Endo/Heme/Allergies: Negative.   Psychiatric/Behavioral: Negative.   All other systems reviewed and are negative. 14 point ROS was done and is otherwise as detailed above or in HPI   PHYSICAL EXAMINATION: ECOG PERFORMANCE STATUS: 1 - Symptomatic but completely ambulatory  Vitals:   10/27/15 1124  BP: (!) 139/54  Pulse: 61  Resp: 16  Temp: 98 F (36.7 C)   Filed Weights   10/27/15 1124  Weight: 129 lb 12.8 oz (58.9 kg)    Physical Exam  Constitutional: She is oriented to person, place, and time and well-developed, well-nourished, and in no distress. No distress.  Well groomed  HENT:  Head: Normocephalic and atraumatic.  Mouth/Throat: Oropharynx is clear and moist. No oropharyngeal exudate.  Eyes: Conjunctivae and EOM are normal. Pupils are equal, round, and reactive to light. Right eye exhibits no discharge. Left eye exhibits no discharge. No scleral icterus.  Neck: Normal range of motion. Neck supple. No tracheal deviation present. No thyromegaly present.  Cardiovascular: Normal rate, regular rhythm and intact distal pulses.   No murmur heard. Pulmonary/Chest: Effort normal and breath sounds normal.  History of emphysema.  Abdominal: Soft. Bowel sounds are normal. She exhibits no distension and no mass. There is no tenderness. There is no rebound and no guarding.  Musculoskeletal: Normal range of motion. She exhibits no edema or tenderness.  Lymphadenopathy:    She has no cervical adenopathy.  Neurological: She is alert and oriented to person, place, and time. Gait normal. Coordination normal.  Skin: Skin is warm  and dry. She is not diaphoretic.  Psychiatric: Mood, memory, affect and judgment normal.  Nursing note and vitals reviewed.   LABORATORY DATA:  I have reviewed the data as listed Results for LAVON, HORN (MRN [id number]) as of 10/27/2015 09:17 Results for KOLLEEN, OCHSNER (MRN [id number]) as of 10/27/2015 17:45  Ref. Range 10/27/2015 10:26  Sodium Latest Ref Range: 135 - 145 mmol/L 137  Potassium Latest Ref Range: 3.5 - 5.1 mmol/L 4.8  Chloride Latest Ref Range: 101 - 111 mmol/L 109  CO2 Latest Ref Range: 22 - 32 mmol/L 22  BUN Latest Ref Range: 6 - 20 mg/dL 22 (H)  Creatinine Latest Ref Range: 0.44 - 1.00 mg/dL 1.31 (H)  Calcium Latest Ref Range: 8.9 - 10.3 mg/dL 9.1  EGFR (Non-African Amer.) Latest Ref Range: >60 mL/min 38 (L)  EGFR (African American) Latest Ref Range: >60 mL/min 44 (L)  Glucose Latest Ref Range: 65 - 99 mg/dL 87  Anion gap Latest Ref Range: 5 - 15  6  Alkaline Phosphatase Latest Ref Range: 38 - 126 U/L 111  Albumin Latest Ref Range: 3.5 - 5.0 g/dL 3.7  AST Latest Ref Range: 15 - 41 U/L 18  ALT Latest Ref Range: 14 - 54 U/L  14  Total Protein Latest Ref Range: 6.5 - 8.1 g/dL 6.5  Total Bilirubin Latest Ref Range: 0.3 - 1.2 mg/dL 0.5  WBC Latest Ref Range: 4.0 - 10.5 K/uL 6.5  RBC Latest Ref Range: 3.87 - 5.11 MIL/uL 3.27 (L)  Hemoglobin Latest Ref Range: 12.0 - 15.0 g/dL 9.8 (L)  HCT Latest Ref Range: 36.0 - 46.0 % 30.0 (L)  MCV Latest Ref Range: 78.0 - 100.0 fL 91.7  MCH Latest Ref Range: 26.0 - 34.0 pg 30.0  MCHC Latest Ref Range: 30.0 - 36.0 g/dL 32.7  RDW Latest Ref Range: 11.5 - 15.5 % 13.1  Platelets Latest Ref Range: 150 - 400 K/uL 111 (L)  Neutrophils Latest Units: % 67  Lymphocytes Latest Units: % 27  Monocytes Relative Latest Units: % 4  Eosinophil Latest Units: % 2  Basophil Latest Units: % 0  NEUT# Latest Ref Range: 1.7 - 7.7 K/uL 4.3  Lymphocyte # Latest Ref Range: 0.7 - 4.0 K/uL 1.8  Monocyte # Latest Ref Range: 0.1 - 1.0 K/uL 0.3  Eosinophils  Absolute Latest Ref Range: 0.0 - 0.7 K/uL 0.1  Basophils Absolute Latest Ref Range: 0.0 - 0.1 K/uL 0.0    PATHOLOGY     2012                  RADIOGRAPHIC STUDIES: I have personally reviewed the radiological images as listed and agreed with the findings in the report. No results found. Study Result   CLINICAL DATA:  Subsequent evaluation of a 75 year old female with history of stage IV pancreatic cancer with metastatic disease to both lungs. Originally diagnosed in 2010 status post surgical resection, chemotherapy and radiation therapy. No current complaints.  EXAM: CT CHEST, ABDOMEN, AND PELVIS WITH CONTRAST  TECHNIQUE: Multidetector CT imaging of the chest, abdomen and pelvis was performed following the standard protocol during bolus administration of intravenous contrast.  CONTRAST:  124m ISOVUE-300 IOPAMIDOL (ISOVUE-300) INJECTION 61%  COMPARISON:  Multiple priors, most recently CT of the chest, abdomen and pelvis 07/29/2015.  FINDINGS: CT CHEST FINDINGS  Cardiovascular: Heart size is borderline enlarged. There is no significant pericardial fluid, thickening or pericardial calcification. There is aortic atherosclerosis, as well as atherosclerosis of the great vessels of the mediastinum and the coronary arteries, including calcified atherosclerotic plaque in the left main, left anterior descending, left circumflex and right coronary arteries. Faint calcifications of the aortic valve. Right internal jugular single-lumen porta cath with tip terminating in the distal superior vena cava.  Mediastinum/Nodes: No pathologically enlarged mediastinal or hilar lymph nodes. 1.8 cm low-attenuation nodule in the right lobe of the thyroid gland is similar to prior studies, and nonspecific, but likely benign. Esophagus is normal in appearance. No axillary lymphadenopathy.  Lungs/Pleura: Again noted are multifocal highly irregular areas of nodular  and mass-like appearing architectural distortion in the lungs bilaterally. These are most evident throughout the mid to lower lungs, and presumably reflect widespread metastatic disease. The largest of these lesions is a pleural-based cavitary mass in the posterior aspect of the right lower lobe (image 113 of series 6) which has enlarged in size and currently measures 6.9 x 4.0 cm. The other smaller lesions are are generally stable to slightly increased in size compared to the prior study, although direct comparison between examinations is challenging due to the highly irregular shape of these lesions. No new pulmonary lesions are noted. Postoperative changes of wedge resection are noted in the right upper lobe. Mild diffuse bronchial wall thickening with severe centrilobular  and paraseptal emphysema. No acute consolidative airspace disease. No pleural effusions.  Musculoskeletal: Lytic lesion in the anterolateral aspect of the left fifth rib where there is a pathologic fracture (image 77 of series 6). Multiple other old and healing left-sided rib fractures are noted.  CT ABDOMEN PELVIS FINDINGS  Hepatobiliary: Pneumobilia, presumably related to prior sphincterotomy. No suspicious cystic or solid hepatic lesions. Status post cholecystectomy.  Pancreas: Postoperative changes of prior Whipple procedure are again noted. Gas is noted in the main pancreatic duct.  Spleen: Unremarkable.  Adrenals/Urinary Tract: 1.8 x 1.5 cm right adrenal nodule is similar to prior examinations, and was not hypermetabolic on prior PET-CT 07/15/2014, presumably a benign lesion such as an adenoma. Mild adrenal thickening of the left adrenal gland is unchanged. Right kidney is normal in appearance. 1.7 cm simple cyst in the lower pole of the left kidney. No hydroureteronephrosis. Urinary bladder is normal in appearance.  Stomach/Bowel: Postoperative changes related to prior Whipple procedure are  again noted. Ventral hernia containing short segments of small bowel in the epigastric region. There is some mild dilatation of a loop of small bowel adjacent to the ventral hernia in the epigastric region (image 61 of series 2) which measures up to 4.4 cm in diameter. However, there is no other pathologic dilatation of small bowel or colon to suggest frank bowel obstruction at this time. Normal appendix.  Vascular/Lymphatic: Fusiform aneurysmal dilatation of the infrarenal abdominal aorta which measures up to 3.9 x 3.5 cm (image 68 of series 2), similar to prior examinations. Prominent soft tissue adjacent to the proximal superior mesenteric artery and in the aortocaval region (axial images 60-63 of series 2) measuring up to 2.5 x 1.8 cm, likely to represent treated lymphadenopathy. No other lymphadenopathy noted elsewhere in the abdomen or pelvis.  Reproductive: Status post hysterectomy. Ovaries are not confidently identified may be surgically absent or atrophic.  Other: No significant volume of ascites.  No pneumoperitoneum.  Musculoskeletal: There are no aggressive appearing lytic or blastic lesions noted in the visualized portions of the skeleton.  IMPRESSION: 1. Slight progression of multifocal cavitary pulmonary metastases, as discussed above. 2. Interval development of a lytic lesion in the antral lateral aspect of the left fifth rib where there is a nondisplaced pathologic fracture. No other definite osseous metastases are identified. 3. Prominent soft tissue adjacent to the proximal superior mesenteric artery and in the aortocaval nodal station, similar to prior examinations, likely to represent treated metastatic lymphadenopathy. 4. Epigastric ventral hernia containing several short loops of small bowel. There is a focally dilated loop of small bowel adjacent to this, however, this is an isolated finding. No other dilatation of small bowel or colon to suggest  frank bowel obstruction at this time. 5. Aortic atherosclerosis, in addition to left main and 3 vessel coronary artery disease. In addition, there is a 3.9 x 3.5 cm fusiform infrarenal abdominal aortic aneurysm which is similar to prior studies. Recommend followup by ultrasound in 2 years. This recommendation follows ACR consensus guidelines: White Paper of the ACR Incidental Findings Committee II on Vascular Findings. J Am Coll Radiol 2013; 10:789-794. 6. Additional incidental findings, as above.   Electronically Signed   By: Daniel  Entrikin M.D.   On: 09/29/2015 16:11    ASSESSMENT & PLAN:  Stage IV Pancreatic Adenocarcinoma Lung Adenocarcinoma RIJ thrombosis Transverse Sinus Thrombosis Chronic Anticoagulation with lovenox L pneumothorax, spontaneous Malignant Pleural Effusion, Talc Pleurodesis Preserved expression of major/minor MMR proteins done on 2017 LUL biopsy by Dr. Van   Trigt Dyspnea Anemia  Complicated course but she has certainly done well for many years. She and her sister are not unrealistic. She has a HCPOA and living will in place. We discussed that although her pancreatic cancer is PDL1 positive this unfortunately does not translate into benefit from immunotherapy, MSI - high status does and unfortunately, her disease is MSI - stable. .  Patient is here with her sister today. She seemed to be struggling somewhat with chemo, but she said she would like to continue treatment. I discussed with her whether or not continuing chemotherapy would affect her quality of life. [name] [name] said her pain and nausea was not unbearable. She wants to try another cycle and see if her diarrhea and nausea improves after cycle 2. I will add EMEND, I have dose reduced her outright.   Cycle 2 is scheduled for 11/02/2015.  Patient's blood work looks the same.  I will repeat tumor marker after three cycles and scan after the fourth cycle.   Follow up on 11/09/2015. If she changes her mind  and wishes to discontinue therapy prior to follow-up she was advised to call.  I reminded her I am here to support her decisions.   All questions were answered. The patient knows to call the clinic with any problems, questions or concerns.  This document serves as a record of services personally performed by Ancil Linsey, MD. It was created on her behalf by Elmyra Ricks, a trained medical scribe. The creation of this record is based on the scribe's personal observations and the provider's statements to them. This document has been checked and approved by the attending provider.  I have reviewed the above documentation for accuracy and completeness and I agree with the above.  This note was electronically signed.    Molli Hazard, MD  10/27/2015 5:41 PM

## 2015-10-27 NOTE — Patient Instructions (Addendum)
[place] at [facility] Discharge Instructions  RECOMMENDATIONS MADE BY THE CONSULTANT AND ANY TEST RESULTS WILL BE SENT TO YOUR REFERRING PHYSICIAN.  You saw Dr. Whitney Muse today. Chemotherapy on 9/11. Follow up 1 week after chemo with labs.  Thank you for choosing [place] at [facility] to provide your oncology and hematology care.  To afford each patient quality time with our provider, please arrive at least 15 minutes before your scheduled appointment time.   Beginning January 23rd 2017 lab work for the [facility] will be done in the  Main lab at [facility] on 1st floor. If you have a lab appointment with the [facility] please come in thru the  Main Entrance and check in at the main information desk  You need to re-schedule your appointment should you arrive 10 or more minutes late.  We strive to give you quality time with our providers, and arriving late affects you and other patients whose appointments are after yours.  Also, if you no show three or more times for appointments you may be dismissed from the clinic at the providers discretion.     Again, thank you for choosing [facility].  Our hope is that these requests will decrease the amount of time that you wait before being seen by our physicians.       _____________________________________________________________  Should you have questions after your visit to [facility], please contact our office at [phone] between the hours of 8:30 a.m. and 4:30 p.m.  Voicemails left after 4:30 p.m. will not be returned until the following business day.  For prescription refill requests, have your pharmacy contact our office.         Resources For Cancer Patients and their Caregivers ? American Cancer Society: Can assist with transportation, wigs, general needs, runs Look Good Feel Better.        2367304791 ? Cancer Care: Provides financial  assistance, online support groups, medication/co-pay assistance.  1-800-813-HOPE (270)741-0890) ? [place] Assists [place] cancer patients and their families through emotional , educational and financial support.  (765)227-2195 ? [place] DSS Where to apply for food stamps, Medicaid and utility assistance. 873 850 5036 ? [employer]: Transportation to medical appointments. 845-750-6405 ? Social Security Administration: May apply for disability if have a Stage IV cancer. 940-023-5465 828-211-6622 ? [employer], Disability and Transit Services: Assists with nutrition, care and transit needs. [place] Support Programs: '@10RELATIVEDAYS'$ @ > Cancer Support Group  2nd Tuesday of the month 1pm-2pm, Journey Room  > Creative Journey  3rd Tuesday of the month 1130am-1pm, Journey Room  > Look Good Feel Better  1st Wednesday of the month 10am-12 noon, Journey Room (Call [place] to register [phone])

## 2015-11-02 ENCOUNTER — Encounter (HOSPITAL_BASED_OUTPATIENT_CLINIC_OR_DEPARTMENT_OTHER): Payer: Medicare Other

## 2015-11-02 VITALS — BP 110/56 | HR 52 | Temp 98.1°F | Resp 18 | Wt 129.2 lb

## 2015-11-02 DIAGNOSIS — C259 Malignant neoplasm of pancreas, unspecified: Secondary | ICD-10-CM

## 2015-11-02 DIAGNOSIS — C7800 Secondary malignant neoplasm of unspecified lung: Secondary | ICD-10-CM

## 2015-11-02 DIAGNOSIS — Z5111 Encounter for antineoplastic chemotherapy: Secondary | ICD-10-CM

## 2015-11-02 DIAGNOSIS — C7802 Secondary malignant neoplasm of left lung: Secondary | ICD-10-CM

## 2015-11-02 DIAGNOSIS — Z23 Encounter for immunization: Secondary | ICD-10-CM | POA: Diagnosis not present

## 2015-11-02 LAB — CBC WITH DIFFERENTIAL/PLATELET
Basophils Absolute: 0 10*3/uL (ref 0.0–0.1)
Basophils Relative: 0 %
Eosinophils Absolute: 0.1 10*3/uL (ref 0.0–0.7)
Eosinophils Relative: 1 %
HEMATOCRIT: 28.4 % — AB (ref 36.0–46.0)
HEMOGLOBIN: 9.3 g/dL — AB (ref 12.0–15.0)
LYMPHS ABS: 1.2 10*3/uL (ref 0.7–4.0)
Lymphocytes Relative: 25 %
MCH: 30.3 pg (ref 26.0–34.0)
MCHC: 32.7 g/dL (ref 30.0–36.0)
MCV: 92.5 fL (ref 78.0–100.0)
MONOS PCT: 12 %
Monocytes Absolute: 0.6 10*3/uL (ref 0.1–1.0)
NEUTROS ABS: 3 10*3/uL (ref 1.7–7.7)
NEUTROS PCT: 62 %
Platelets: 123 10*3/uL — ABNORMAL LOW (ref 150–400)
RBC: 3.07 MIL/uL — ABNORMAL LOW (ref 3.87–5.11)
RDW: 13.8 % (ref 11.5–15.5)
WBC: 4.9 10*3/uL (ref 4.0–10.5)

## 2015-11-02 LAB — COMPREHENSIVE METABOLIC PANEL
ALK PHOS: 96 U/L (ref 38–126)
ALT: 12 U/L — ABNORMAL LOW (ref 14–54)
ANION GAP: 8 (ref 5–15)
AST: 18 U/L (ref 15–41)
Albumin: 3.7 g/dL (ref 3.5–5.0)
BILIRUBIN TOTAL: 0.6 mg/dL (ref 0.3–1.2)
BUN: 19 mg/dL (ref 6–20)
CALCIUM: 9 mg/dL (ref 8.9–10.3)
CO2: 18 mmol/L — ABNORMAL LOW (ref 22–32)
Chloride: 114 mmol/L — ABNORMAL HIGH (ref 101–111)
Creatinine, Ser: 1.51 mg/dL — ABNORMAL HIGH (ref 0.44–1.00)
GFR calc non Af Amer: 32 mL/min — ABNORMAL LOW (ref >60)
GFR, EST AFRICAN AMERICAN: 37 mL/min — AB (ref >60)
Glucose, Bld: 100 mg/dL — ABNORMAL HIGH (ref 65–99)
Potassium: 4.1 mmol/L (ref 3.5–5.1)
Sodium: 140 mmol/L (ref 135–145)
TOTAL PROTEIN: 6.4 g/dL — AB (ref 6.5–8.1)

## 2015-11-02 MED ORDER — SODIUM CHLORIDE 0.9 % IV SOLN
1500.0000 mg/m2 | INTRAVENOUS | Status: DC
  Administered 2015-11-02: 2450 mg via INTRAVENOUS
  Filled 2015-11-02: qty 49

## 2015-11-02 MED ORDER — SODIUM CHLORIDE 0.9% FLUSH
10.0000 mL | INTRAVENOUS | Status: DC | PRN
  Administered 2015-11-02: 10 mL
  Filled 2015-11-02: qty 10

## 2015-11-02 MED ORDER — HEPARIN SOD (PORK) LOCK FLUSH 100 UNIT/ML IV SOLN: 500.0000 [IU] | Freq: Once | INTRAVENOUS | Status: DC | PRN

## 2015-11-02 MED ORDER — SODIUM CHLORIDE 0.9 % IV SOLN
35.0000 mg/m2 | Freq: Once | INTRAVENOUS | Status: AC
  Administered 2015-11-02: 55.9 mg via INTRAVENOUS
  Filled 2015-11-02: qty 13

## 2015-11-02 MED ORDER — INFLUENZA VAC SPLIT QUAD 0.5 ML IM SUSY
0.5000 mL | PREFILLED_SYRINGE | Freq: Once | INTRAMUSCULAR | Status: AC
  Administered 2015-11-02: 0.5 mL via INTRAMUSCULAR
  Filled 2015-11-02: qty 0.5

## 2015-11-02 MED ORDER — PALONOSETRON HCL INJECTION 0.25 MG/5ML
0.2500 mg | Freq: Once | INTRAVENOUS | Status: AC
  Administered 2015-11-02: 0.25 mg via INTRAVENOUS
  Filled 2015-11-02: qty 5

## 2015-11-02 MED ORDER — LEUCOVORIN CALCIUM INJECTION 350 MG
400.0000 mg/m2 | Freq: Once | INTRAMUSCULAR | Status: AC
  Administered 2015-11-02: 652 mg via INTRAVENOUS
  Filled 2015-11-02: qty 32.6

## 2015-11-02 MED ORDER — SODIUM CHLORIDE 0.9 % IV SOLN
Freq: Once | INTRAVENOUS | Status: AC
  Administered 2015-11-02: 11:00:00 via INTRAVENOUS

## 2015-11-02 MED ORDER — SODIUM CHLORIDE 0.9 % IV SOLN
Freq: Once | INTRAVENOUS | Status: AC
  Administered 2015-11-02: 10:00:00 via INTRAVENOUS

## 2015-11-02 MED ORDER — PROCHLORPERAZINE MALEATE 10 MG PO TABS
10.0000 mg | ORAL_TABLET | Freq: Once | ORAL | Status: AC
  Administered 2015-11-02: 10 mg via ORAL
  Filled 2015-11-02: qty 1

## 2015-11-02 NOTE — Patient Instructions (Signed)
[facility] Discharge Instructions for Patients Receiving Chemotherapy   Beginning January 23rd 2017 lab work for the [facility] will be done in the  Main lab at [facility] on 1st floor. If you have a lab appointment with the [place] please come in thru the  Main Entrance and check in at the main information desk   Today you received the following chemotherapy agents Irinotecan, Leucovorin and 5FU as well as Influenza vaccine. Follow-up as scheduled. Call [facility] for any questions or concerns  To help prevent nausea and vomiting after your treatment, we encourage you to take your nausea medication   If you develop nausea and vomiting, or diarrhea that is not controlled by your medication, call the clinic.  The clinic phone number is [phone]. Office hours are Monday-Friday 8:30am-5:00pm.  BELOW ARE SYMPTOMS THAT SHOULD BE REPORTED IMMEDIATELY:  *FEVER GREATER THAN 101.0 F  *CHILLS WITH OR WITHOUT FEVER  NAUSEA AND VOMITING THAT IS NOT CONTROLLED WITH YOUR NAUSEA MEDICATION  *UNUSUAL SHORTNESS OF BREATH  *UNUSUAL BRUISING OR BLEEDING  TENDERNESS IN MOUTH AND THROAT WITH OR WITHOUT PRESENCE OF ULCERS  *URINARY PROBLEMS  *BOWEL PROBLEMS  UNUSUAL RASH Items with * indicate a potential emergency and should be followed up as soon as possible. If you have an emergency after office hours please contact your primary care physician or go to the nearest emergency department.  Please call the clinic during office hours if you have any questions or concerns.   You may also contact the Patient Navigator at [phone] should you have any questions or need assistance in obtaining follow up care.      Resources For Cancer Patients and their Caregivers ? American Cancer Society: Can assist with transportation, wigs, general needs, runs Look Good Feel Better.        (862)319-3812 ? Cancer Care: Provides financial assistance, online support groups,  medication/co-pay assistance.  1-800-813-HOPE [phone]) ? [place] Assists [place] cancer patients and their families through emotional , educational and financial support.  571-629-2390 ? [place] DSS Where to apply for food stamps, Medicaid and utility assistance. 423-352-1044 ? RCATS: Transportation to medical appointments. (910)728-9775 ? Social Security Administration: May apply for disability if have a Stage IV cancer. 718-483-3178 [phone] ? [employer], Disability and Transit Services: Assists with nutrition, care and transit needs. [phone]

## 2015-11-02 NOTE — Progress Notes (Signed)
[name] [name] tolerated chemo tx and Influenza vaccine well without complaints. Labs shown to Dr. Whitney Muse and orders were given for pt to receive 500 ml NS extra today since her Creatinine had increased to 1.51. Pt discharged with 5FU pump infusing without issues. VSS upon discharge and pt was discharged self ambulatory in satisfactory condition with sister

## 2015-11-04 ENCOUNTER — Encounter (HOSPITAL_COMMUNITY): Payer: Medicare Other

## 2015-11-04 ENCOUNTER — Telehealth (HOSPITAL_COMMUNITY): Payer: Self-pay | Admitting: Emergency Medicine

## 2015-11-04 ENCOUNTER — Encounter (HOSPITAL_BASED_OUTPATIENT_CLINIC_OR_DEPARTMENT_OTHER): Payer: Medicare Other

## 2015-11-04 VITALS — BP 121/51 | HR 55 | Temp 98.2°F | Resp 16

## 2015-11-04 DIAGNOSIS — Z452 Encounter for adjustment and management of vascular access device: Secondary | ICD-10-CM

## 2015-11-04 DIAGNOSIS — C259 Malignant neoplasm of pancreas, unspecified: Secondary | ICD-10-CM

## 2015-11-04 DIAGNOSIS — C7802 Secondary malignant neoplasm of left lung: Secondary | ICD-10-CM

## 2015-11-04 DIAGNOSIS — C7800 Secondary malignant neoplasm of unspecified lung: Principal | ICD-10-CM

## 2015-11-04 MED ORDER — SODIUM CHLORIDE 0.9% FLUSH
10.0000 mL | INTRAVENOUS | Status: DC | PRN
  Administered 2015-11-04: 10 mL
  Filled 2015-11-04: qty 10

## 2015-11-04 MED ORDER — HEPARIN SOD (PORK) LOCK FLUSH 100 UNIT/ML IV SOLN
500.0000 [IU] | Freq: Once | INTRAVENOUS | Status: AC | PRN
  Administered 2015-11-04: 500 [IU]

## 2015-11-04 NOTE — Telephone Encounter (Signed)
Pt called and said that after she stopped her pump it was still beeping.  I went through the steps with her over the phone.

## 2015-11-04 NOTE — Patient Instructions (Signed)
[place] at [facility] Discharge Instructions  RECOMMENDATIONS MADE BY THE CONSULTANT AND ANY TEST RESULTS WILL BE SENT TO YOUR REFERRING PHYSICIAN.  Pump removal with port flush.    Thank you for choosing [place] at [facility] to provide your oncology and hematology care.  To afford each patient quality time with our provider, please arrive at least 15 minutes before your scheduled appointment time.   Beginning January 23rd 2017 lab work for the [facility] will be done in the  Main lab at [facility] on 1st floor. If you have a lab appointment with the [facility] please come in thru the  Main Entrance and check in at the main information desk  You need to re-schedule your appointment should you arrive 10 or more minutes late.  We strive to give you quality time with our providers, and arriving late affects you and other patients whose appointments are after yours.  Also, if you no show three or more times for appointments you may be dismissed from the clinic at the providers discretion.     Again, thank you for choosing [facility].  Our hope is that these requests will decrease the amount of time that you wait before being seen by our physicians.       _____________________________________________________________  Should you have questions after your visit to [facility], please contact our office at [phone] between the hours of 8:30 a.m. and 4:30 p.m.  Voicemails left after 4:30 p.m. will not be returned until the following business day.  For prescription refill requests, have your pharmacy contact our office.         Resources For Cancer Patients and their Caregivers ? American Cancer Society: Can assist with transportation, wigs, general needs, runs Look Good Feel Better.        410-825-8314 ? Cancer Care: Provides financial assistance, online support groups, medication/co-pay assistance.   1-800-813-HOPE 385-809-6517) ? [place] Assists [place] cancer patients and their families through emotional , educational and financial support.  239-177-8028 ? [place] DSS Where to apply for food stamps, Medicaid and utility assistance. 339-617-8143 ? [employer]ATS: Transportation to medical appointments. 9148096969 ? Social Security Administration: May apply for disability if have a Stage IV cancer. 5311612546 208-506-9644 ? [employer], Disability and Transit Services: Assists with nutrition, care and transit needs. [place] Support Programs: '@10RELATIVEDAYS'$ @ > Cancer Support Group  2nd Tuesday of the month 1pm-2pm, Journey Room  > Creative Journey  3rd Tuesday of the month 1130am-1pm, Journey Room  > Look Good Feel Better  1st Wednesday of the month 10am-12 noon, Journey Room (Call [place] to register [phone])

## 2015-11-04 NOTE — Progress Notes (Signed)
Patient arrived for home infusion pump removal and port flush.  Home infusion complete and pump removed.  Port flushed per protocol.  Port access site clean and dry without any active bleeding.  Patient ambulatory and stable upon discharge from clinic.

## 2015-11-06 NOTE — Progress Notes (Signed)
[place] [facility]  Progress Note  Patient Care Team: Monico Blitz, MD as PCP - General  CHIEF COMPLAINTS/PURPOSE OF CONSULTATION:    Pancreatic cancer metastasized to lung [facility])   09/03/2008 Surgery    Whipple with Dr. Birdie Sons for T3N1 3/12 LN+ Pancreatic Adenocarcinoma      11/13/2008 - 09/12/2009 Chemotherapy    Adjuvant Gemcitabine X 6 months      08/31/2009 Surgery    Metastectomy with Dr. Arlyce Dice RUL 2 nodules, RLL (wedge resection X 2)      08/31/2009 Pathology Results    Metastatic adenocarcinoma TTF-1 negative      06/03/2010 Surgery    Left video-assisted thoracoscopic surgery, resection of left lower lobe lesion. with Dr. Arlyce Dice      06/04/2010 Pathology Results    adenocarcinoma positive for cytokeratin 7, focally positive for CDX2, with scattered staining for TTF1 cytokeratin 20. [facility] opinion felt to be lung primary although noted to stain for GI markers, not felt to be pancreatic primary. EGFR and ALK negative      03/31/2011 - 09/04/2011 Radiation Therapy    SBRT LUL 18Gy Dr. Tammi Klippel      03/15/2012 - 06/13/2012 Chemotherapy    5-FU based chemotherapy      02/13/2013 - 05/20/2013 Chemotherapy    Gemzar Abraxane for metastatic disease (given to cover both primaries at [facility])      07/15/2014 PET scan    Mild progression of pulm mets, hypermet mesenteric tumor assoc with  SMA is stable to slightly increased in degree of FDG uptake      07/23/2014 Imaging         07/30/2014 - 09/10/2014 Chemotherapy    Gemzar abraxane stopped due to poor tolerance      04/06/2015 Procedure    L 20 French chest tube for spontaneous pneumothorax, by Dr. Tharon Aquas Trigt      04/20/2015 Pathology Results    High grade carcinoma c/w patient's known pancreatic primary (secondary opinion at [facility]) PDL-1 high expression, preserved major and minor MMR, MSI stable, [facility] ONE testing performed,       04/20/2015 Surgery    Left VATS, mini  thoracotomy with stapling and oversewing of blebs, Talc pleurodesis. performed secondary to persistent air leak not improved with chest tube drainage      07/29/2015 Imaging    No signif change in appear of multifocal B cavitary pulmonary lesions, postop appearance upper abdomen, no findings of met disease to abd or pelvis,      09/29/2015 Imaging    Slight progression of multifocal cavitary pulmonary metastases, as discussed above. 2. Interval development of a lytic lesion in the antral lateral aspect of the left fifth rib where there is a nondisplaced pathologic fracture. No other definite osseous metastases are identified. 3. Prominent soft tissue adjacent to the proximal superior mesenteric artery and in the aortocaval nodal station, similar to prior examinations, likely to represent treated metastatic lymphadenopathy. 4. Epigastric ventral hernia containing several short loops of small bowel. There is a focally dilated loop of small bowel adjacent to this, however, this is an isolated finding. No other dilatation of small bowel or colon to suggest frank bowel obstruction at this time.       10/19/2015 -  Chemotherapy    The patient had palonosetron (ALOXI) injection 0.25 mg, 0.25 mg, Intravenous,  Once, 1 of 4 cycles  leucovorin 652 mg in dextrose 5 % 250 mL infusion, 400 mg/m2 = 652 mg, Intravenous,  Once, 1 of 4 cycles  fluorouracil (ADRUCIL) 2,950 mg in sodium chloride 0.9 % 91 mL chemo infusion, 1,800 mg/m2 = 2,950 mg (100 % of original dose 1,800 mg/m2), Intravenous, 1 Day/Dose, 1 of 4 cycles Dose modification: 1,920 mg/m2 (original dose 1,800 mg/m2, Cycle 1, Reason: Provider Judgment), 1,800 mg/m2 (original dose 1,800 mg/m2, Cycle 1, Reason: Provider Judgment)  irinotecan LIPOSOME (ONIVYDE) 55.9 mg in sodium chloride 0.9 % 500 mL chemo infusion, 35 mg/m2 = 55.9 mg (100 % of original dose 35 mg/m2), Intravenous, Once, 1 of 4 cycles Dose modification: 35 mg/m2 (original dose 35  mg/m2, Cycle 1, Reason: Provider Judgment)  for chemotherapy treatment.         HISTORY OF PRESENTING ILLNESS:  [name] [name] 75 y.o. female is here because of a history of stage IV pancreatic carcinoma, [name] [name] is also reported to have a history of stage IV BAC of the lungs. (On further review I feel [name] [name] may have had stage I BAC of the lungs)  [name] [name] is here today accompanied by her sister, [name] [name]. [name] [name] is currently on  liposomal irinotecan/5-FU.   [name] [name] is accompanied by her sister. [name] [name] is here for a follow up of stage IV pancreatic carcinoma.   [name] [name] is accompanied by her sister. I personally reviewed and went over laboratory studies with the patient.  [name] [name] reports queasiness managed with medication. [name] [name] uses compazine as needed. [name] [name] denies vomiting.  Her bowels are mostly normal with diarrhea managed with imodium.  [name] [name] has lower energy than usual but [name] [name] is able to do what [name] [name] wants to do and takes a nap. [name] [name] continues to go to church and went out to place flowers in the cemetary. 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 [name] [name] continues to cook and clean at home. "As long as I am able to do for myself, I do". Her neighbor does her yard work.  [name] [name] is eating fairly well. [name] [name] has Boost or Ensure in her fridge and admits [name] [name] needs to start making them into milkshakes.  [name] [name] would like to know if the therapy is helping, but understands it is too early to assess this. [name] [name] would like to continue with treatment at this time. No change from her last visit. NO new complaints.    MEDICAL HISTORY:  Past Medical History:  Diagnosis Date  . Abdominal wall hernia   . Aortic aneurysm (HCC)    small aortic aneurysm  . Cancer (HCC) July 2010   Pancreatic adenocarcinoma  . Cataract 2010   b/l surgery  . Cervical cancer (HCC) 1963  . COPD (chronic obstructive pulmonary disease) (HCC)   . Emphysema   . Hypercholesterolemia   . Hypertension     SURGICAL HISTORY: Past Surgical History:    Procedure Laterality Date  . BREAST SURGERY  1988   Right breast abcess  . BUNIONECTOMY  2007  . CHOLECYSTECTOMY  July 2010   done w/ Whipple procedure  . EYE SURGERY  2009   Bilateral cataract  . STAPLING OF BLEBS Left 04/20/2015   Procedure: STAPLING OF BLEBS;  Surgeon: Peter Van Trigt, MD;  Location: [facility] OR;  Service: Thoracic;  Laterality: Left;  . TONSILLECTOMY  1943  . VAGINAL HYSTERECTOMY  1963   Cervical Cancer  . VIDEO ASSISTED THORACOSCOPY Left 04/20/2015   Procedure: VIDEO ASSISTED THORACOSCOPY;  Surgeon: Peter Van Trigt, MD;  Location: [facility] OR;  Service: Thoracic;  Laterality: Left;  . WHIPPLE PROCEDURE  July 2010   pancreatic adenocarcinoma      SOCIAL HISTORY: Social History   Social History  . Marital status: Widowed    Spouse name: N/A  . Number of children: 0  . Years of education: N/A   Occupational History  .  Retired   Social History Main Topics  . Smoking status: Former Smoker    Packs/day: 1.00    Years: 30.00    Types: Cigarettes    Quit date: 02/29/1996  . Smokeless tobacco: Never Used  . Alcohol use No  . Drug use: No  . Sexual activity: No   Other Topics Concern  . Not on file   Social History Narrative  . No narrative on file   Widowed; 5 years ago in December. Married 32 years, no children. Had cervical cancer when [name] [name] was 21 Pancreatic cancer in July of 2010. Worked at [employer] for 27 years; also worked i Solicitor, at [employer], Social research officer, government. Smoked, but quit 15-20 years ago.  Enjoys quilting, crocheting, knitting. Has nieces and nephews.  Was born in [place].  FAMILY HISTORY: Family History  Problem Relation Age of Onset  . Pancreatic cancer Brother   . Colon cancer Sister 73    12/2006 dx surgery and chemotherapy  . Ovarian cancer Sister 61    Ovarian ca,  surgery and chemotherapy    One older sister and one younger sister still living. Four brothers deceased. Daddy died of emphysema back in 03/28/73 at the age of 73-68 Mother was 66 when [name] [name]  died in March 29, 1987. Had 4 brothers, died from 28-Mar-1998 to 03-28-2006 Heart trouble, emphysema & dialysis ("he just had everything"). One brother had lung disease. One brother had pancreatic cancer and lived less than 2 months. [name] [name] has had ovarian and colon cancer. Nephew that died last year with brain cancer. Niece with cancer being treated here.   ALLERGIES:  is allergic to oxaliplatin and aspirin.  MEDICATIONS:  Current Outpatient Prescriptions  Medication Sig Dispense Refill  . diphenoxylate-atropine (LOMOTIL) 2.5-0.025 MG tablet Take 1 tablet by mouth 4 (four) times daily as needed for diarrhea or loose stools. 30 tablet 0  . fluorouracil CALGB 80165 in sodium chloride 0.9 % 150 mL Inject into the vein. To be given every 14 days over 46 hours.    . hydrochlorothiazide (HYDRODIURIL) 25 MG tablet Take by mouth.    Marland Kitchen ibuprofen (ADVIL,MOTRIN) 200 MG tablet Take 400 mg by mouth every 6 (six) hours as needed.    . IRINOTECAN HCL IV Inject into the vein. To be given every 14 days.    Marland Kitchen leucovorin in dextrose 5 % 250 mL Inject into the vein once. To be given every 14 days    . lidocaine-prilocaine (EMLA) cream Apply a quarter size amount to port site 1 hour prior to chemo. Do not rub in. Cover with plastic wrap. 30 g 3  . lipase/protease/amylase (CREON) 36000 UNITS CPEP capsule Take 1 capsule (36,000 Units total) by mouth 3 (three) times daily with meals. 270 capsule 1  . loperamide (IMODIUM A-D) 2 MG tablet At the onset of diarrhea, take 2 tabs. Then take 1 tab every 2 hours until you have gone 12 hours without having a loose stool. If it is bedtime and you have a loose stool(s) take 2 tabs every 4 hours until morning. Call [facility]. 60 tablet 1  . loperamide (IMODIUM) 2 MG capsule     . losartan (COZAAR) 100 MG tablet     . metoprolol succinate (TOPROL-XL) 50 MG 24 hr tablet Take 25 mg by  mouth daily before breakfast. Take with or immediately following a meal.    . nystatin cream (MYCOSTATIN)     .  omeprazole (PRILOSEC) 20 MG capsule Take 20 mg by mouth daily.     . ondansetron (ZOFRAN) 8 MG tablet Take 1 tablet (8 mg total) by mouth every 8 (eight) hours as needed for nausea or vomiting. 30 tablet 2  . prochlorperazine (COMPAZINE) 10 MG tablet Take 1 tablet (10 mg total) by mouth every 6 (six) hours as needed for nausea or vomiting. 30 tablet 2  . XARELTO 20 MG TABS tablet      No current facility-administered medications for this visit.     Review of Systems  Constitutional: Positive for malaise/fatigue.       Less energy than usual  HENT: Negative.   Eyes: Negative.   Cardiovascular: Negative.   Gastrointestinal: Positive for diarrhea and nausea. Negative for abdominal pain, constipation and vomiting.       Nausea managed with medication. Diarrhea managed with imodium  Genitourinary: Negative.   Skin: Negative.   Neurological: Negative.   Endo/Heme/Allergies: Negative.   Psychiatric/Behavioral: Negative.   All other systems reviewed and are negative. 14 point ROS was done and is otherwise as detailed above or in HPI   PHYSICAL EXAMINATION: ECOG PERFORMANCE STATUS: 1 - Symptomatic but completely ambulatory  Vitals:   11/09/15 0952  BP: (!) 90/54  Pulse: 61  Resp: 16  Temp: 97.8 F (36.6 C)   Filed Weights   11/09/15 0952  Weight: 127 lb 12.8 oz (58 kg)    Physical Exam  Constitutional: [name] [name] is oriented to person, place, and time and well-developed, well-nourished, and in no distress. No distress.  Well groomed  HENT:  Head: Normocephalic and atraumatic.  Mouth/Throat: Oropharynx is clear and moist. No oropharyngeal exudate.  Eyes: Conjunctivae and EOM are normal. Pupils are equal, round, and reactive to light. Right eye exhibits no discharge. Left eye exhibits no discharge. No scleral icterus.  Neck: Normal range of motion. Neck supple. No tracheal deviation present. No thyromegaly present.  Cardiovascular: Normal rate, regular rhythm and intact distal pulses.    No murmur heard. Pulmonary/Chest: Effort normal and breath sounds normal.  History of emphysema.  Abdominal: Soft. Bowel sounds are normal. [name] [name] exhibits no distension and no mass. There is no tenderness. There is no rebound and no guarding.  Abdominal hernia  Musculoskeletal: Normal range of motion. [name] [name] exhibits no edema or tenderness.  Lymphadenopathy:    [name] [name] has no cervical adenopathy.  Neurological: [name] [name] is alert and oriented to person, place, and time. Gait normal. Coordination normal.  Skin: Skin is warm and dry. [name] [name] is not diaphoretic.  Psychiatric: Mood, memory, affect and judgment normal.  Nursing note and vitals reviewed.   LABORATORY DATA:  I have reviewed the data as listed Results for Stigler, Naydene M (MRN [id number]) as of 11/09/2015 09:56  Ref. Range 11/09/2015 09:12  Sodium Latest Ref Range: 135 - 145 mmol/L 137  Potassium Latest Ref Range: 3.5 - 5.1 mmol/L 4.5  Chloride Latest Ref Range: 101 - 111 mmol/L 110  CO2 Latest Ref Range: 22 - 32 mmol/L 21 (L)  BUN Latest Ref Range: 6 - 20 mg/dL 21 (H)  Creatinine Latest Ref Range: 0.44 - 1.00 mg/dL 1.34 (H)  Calcium Latest Ref Range: 8.9 - 10.3 mg/dL 9.3  EGFR (Non-African Amer.) Latest Ref Range: >60 mL/min 37 (L)  EGFR (African American) Latest Ref Range: >60 mL/min 43 (L)  Glucose Latest Ref   Range: 65 - 99 mg/dL 101 (H)  Anion gap Latest Ref Range: 5 - 15  6  Alkaline Phosphatase Latest Ref Range: 38 - 126 U/L 98  Albumin Latest Ref Range: 3.5 - 5.0 g/dL 3.8  AST Latest Ref Range: 15 - 41 U/L 17  ALT Latest Ref Range: 14 - 54 U/L 13 (L)  Total Protein Latest Ref Range: 6.5 - 8.1 g/dL 6.4 (L)  Total Bilirubin Latest Ref Range: 0.3 - 1.2 mg/dL 0.3  WBC Latest Ref Range: 4.0 - 10.5 K/uL 4.0  RBC Latest Ref Range: 3.87 - 5.11 MIL/uL 3.05 (L)  Hemoglobin Latest Ref Range: 12.0 - 15.0 g/dL 9.3 (L)  HCT Latest Ref Range: 36.0 - 46.0 % 28.5 (L)  MCV Latest Ref Range: 78.0 - 100.0 fL 93.4  MCH Latest Ref Range: 26.0 - 34.0  pg 30.5  MCHC Latest Ref Range: 30.0 - 36.0 g/dL 32.6  RDW Latest Ref Range: 11.5 - 15.5 % 14.0  Platelets Latest Ref Range: 150 - 400 K/uL 127 (L)  Neutrophils Latest Units: % 60  Lymphocytes Latest Units: % 33  Monocytes Relative Latest Units: % 5  Eosinophil Latest Units: % 2  Basophil Latest Units: % 0  NEUT# Latest Ref Range: 1.7 - 7.7 K/uL 2.5  Lymphocyte # Latest Ref Range: 0.7 - 4.0 K/uL 1.3  Monocyte # Latest Ref Range: 0.1 - 1.0 K/uL 0.2  Eosinophils Absolute Latest Ref Range: 0.0 - 0.7 K/uL 0.1  Basophils Absolute Latest Ref Range: 0.0 - 0.1 K/uL 0.0    PATHOLOGY     2012                  RADIOGRAPHIC STUDIES: I have personally reviewed the radiological images as listed and agreed with the findings in the report. No results found. Study Result   CLINICAL DATA:  Subsequent evaluation of a 75-year-old female with history of stage IV pancreatic cancer with metastatic disease to both lungs. Originally diagnosed in 2010 status post surgical resection, chemotherapy and radiation therapy. No current complaints.  EXAM: CT CHEST, ABDOMEN, AND PELVIS WITH CONTRAST  TECHNIQUE: Multidetector CT imaging of the chest, abdomen and pelvis was performed following the standard protocol during bolus administration of intravenous contrast.  CONTRAST:  100mL ISOVUE-300 IOPAMIDOL (ISOVUE-300) INJECTION 61%  COMPARISON:  Multiple priors, most recently CT of the chest, abdomen and pelvis 07/29/2015.  FINDINGS: CT CHEST FINDINGS  Cardiovascular: Heart size is borderline enlarged. There is no significant pericardial fluid, thickening or pericardial calcification. There is aortic atherosclerosis, as well as atherosclerosis of the great vessels of the mediastinum and the coronary arteries, including calcified atherosclerotic plaque in the left main, left anterior descending, left circumflex and right coronary arteries. Faint calcifications of the aortic  valve. Right internal jugular single-lumen porta cath with tip terminating in the distal superior vena cava.  Mediastinum/Nodes: No pathologically enlarged mediastinal or hilar lymph nodes. 1.8 cm low-attenuation nodule in the right lobe of the thyroid gland is similar to prior studies, and nonspecific, but likely benign. Esophagus is normal in appearance. No axillary lymphadenopathy.  Lungs/Pleura: Again noted are multifocal highly irregular areas of nodular and mass-like appearing architectural distortion in the lungs bilaterally. These are most evident throughout the mid to lower lungs, and presumably reflect widespread metastatic disease. The largest of these lesions is a pleural-based cavitary mass in the posterior aspect of the right lower lobe (image 113 of series 6) which has enlarged in size and currently measures 6.9 x 4.0 cm.   The other smaller lesions are are generally stable to slightly increased in size compared to the prior study, although direct comparison between examinations is challenging due to the highly irregular shape of these lesions. No new pulmonary lesions are noted. Postoperative changes of wedge resection are noted in the right upper lobe. Mild diffuse bronchial wall thickening with severe centrilobular and paraseptal emphysema. No acute consolidative airspace disease. No pleural effusions.  Musculoskeletal: Lytic lesion in the anterolateral aspect of the left fifth rib where there is a pathologic fracture (image 77 of series 6). Multiple other old and healing left-sided rib fractures are noted.  CT ABDOMEN PELVIS FINDINGS  Hepatobiliary: Pneumobilia, presumably related to prior sphincterotomy. No suspicious cystic or solid hepatic lesions. Status post cholecystectomy.  Pancreas: Postoperative changes of prior Whipple procedure are again noted. Gas is noted in the main pancreatic duct.  Spleen: Unremarkable.  Adrenals/Urinary Tract: 1.8 x  1.5 cm right adrenal nodule is similar to prior examinations, and was not hypermetabolic on prior PET-CT 07/15/2014, presumably a benign lesion such as an adenoma. Mild adrenal thickening of the left adrenal gland is unchanged. Right kidney is normal in appearance. 1.7 cm simple cyst in the lower pole of the left kidney. No hydroureteronephrosis. Urinary bladder is normal in appearance.  Stomach/Bowel: Postoperative changes related to prior Whipple procedure are again noted. Ventral hernia containing short segments of small bowel in the epigastric region. There is some mild dilatation of a loop of small bowel adjacent to the ventral hernia in the epigastric region (image 61 of series 2) which measures up to 4.4 cm in diameter. However, there is no other pathologic dilatation of small bowel or colon to suggest frank bowel obstruction at this time. Normal appendix.  Vascular/Lymphatic: Fusiform aneurysmal dilatation of the infrarenal abdominal aorta which measures up to 3.9 x 3.5 cm (image 68 of series 2), similar to prior examinations. Prominent soft tissue adjacent to the proximal superior mesenteric artery and in the aortocaval region (axial images 60-63 of series 2) measuring up to 2.5 x 1.8 cm, likely to represent treated lymphadenopathy. No other lymphadenopathy noted elsewhere in the abdomen or pelvis.  Reproductive: Status post hysterectomy. Ovaries are not confidently identified may be surgically absent or atrophic.  Other: No significant volume of ascites.  No pneumoperitoneum.  Musculoskeletal: There are no aggressive appearing lytic or blastic lesions noted in the visualized portions of the skeleton.  IMPRESSION: 1. Slight progression of multifocal cavitary pulmonary metastases, as discussed above. 2. Interval development of a lytic lesion in the antral lateral aspect of the left fifth rib where there is a nondisplaced pathologic fracture. No other definite  osseous metastases are identified. 3. Prominent soft tissue adjacent to the proximal superior mesenteric artery and in the aortocaval nodal station, similar to prior examinations, likely to represent treated metastatic lymphadenopathy. 4. Epigastric ventral hernia containing several short loops of small bowel. There is a focally dilated loop of small bowel adjacent to this, however, this is an isolated finding. No other dilatation of small bowel or colon to suggest frank bowel obstruction at this time. 5. Aortic atherosclerosis, in addition to left main and 3 vessel coronary artery disease. In addition, there is a 3.9 x 3.5 cm fusiform infrarenal abdominal aortic aneurysm which is similar to prior studies. Recommend followup by ultrasound in 2 years. This recommendation follows ACR consensus guidelines: White Paper of the ACR Incidental Findings Committee II on Vascular Findings. J Am Coll Radiol 2013; 10:789-794. 6. Additional incidental findings, as above.     Electronically Signed   By: Daniel  Entrikin M.D.   On: 09/29/2015 16:11    ASSESSMENT & PLAN:  Stage IV Pancreatic Adenocarcinoma Lung Adenocarcinoma RIJ thrombosis Transverse Sinus Thrombosis Chronic Anticoagulation with lovenox L pneumothorax, spontaneous Malignant Pleural Effusion, Talc Pleurodesis Preserved expression of major/minor MMR proteins done on 2017 LUL biopsy by Dr. Van Trigt Dyspnea Anemia  Complicated course but [name] [name] has certainly done well for many years. [name] [name] and her sister are not unrealistic. [name] [name] has a HCPOA and living will in place. We discussed that although her pancreatic cancer is PDL1 positive this unfortunately does not translate into benefit from immunotherapy, MSI - high status does and unfortunately, her disease is MSI - stable. .  Patient is here with her sister today. [name] [name] seemed to be struggling somewhat with chemo, but [name] [name] said [name] [name] would like to continue treatment. I discussed with her  whether or not continuing chemotherapy would affect her quality of life. Brendia said her pain and nausea was not unbearable. [name] [name] wants to try another cycle and see if her diarrhea and nausea improves after cycle 2. I will add EMEND, I have dose reduced her outright.   Labs reviewed. Stable.  Weight loss of about 1.5 lbs since 11/02/2015 noted.  [name] [name] will return for Cycle #3 Irinotecan / Leucovorin / 5-FU next Monday, 11/16/2015. At this visit, we will check her CA 19-9.  [name] [name] will return for follow up in two weeks.  All questions were answered. The patient knows to call the clinic with any problems, questions or concerns.  This document serves as a record of services personally performed by Shannon Penland, MD. It was created on her behalf by Elizabeth Ashley, a trained medical scribe. The creation of this record is based on the scribe's personal observations and the provider's statements to them. This document has been checked and approved by the attending provider.  I have reviewed the above documentation for accuracy and completeness and I agree with the above.  This note was electronically signed.    Penland,Shannon Kristen, MD  11/09/2015 9:56 AM      

## 2015-11-08 ENCOUNTER — Encounter (HOSPITAL_COMMUNITY): Payer: Self-pay | Admitting: Hematology & Oncology

## 2015-11-09 ENCOUNTER — Encounter (HOSPITAL_COMMUNITY): Payer: Self-pay | Admitting: Hematology & Oncology

## 2015-11-09 ENCOUNTER — Encounter (HOSPITAL_COMMUNITY): Payer: Medicare Other

## 2015-11-09 ENCOUNTER — Encounter (HOSPITAL_BASED_OUTPATIENT_CLINIC_OR_DEPARTMENT_OTHER): Payer: Medicare Other | Admitting: Hematology & Oncology

## 2015-11-09 VITALS — BP 90/54 | HR 61 | Temp 97.8°F | Resp 16 | Wt 127.8 lb

## 2015-11-09 DIAGNOSIS — D649 Anemia, unspecified: Secondary | ICD-10-CM | POA: Diagnosis not present

## 2015-11-09 DIAGNOSIS — I82C11 Acute embolism and thrombosis of right internal jugular vein: Secondary | ICD-10-CM | POA: Diagnosis not present

## 2015-11-09 DIAGNOSIS — J910 Malignant pleural effusion: Secondary | ICD-10-CM | POA: Diagnosis not present

## 2015-11-09 DIAGNOSIS — R0600 Dyspnea, unspecified: Secondary | ICD-10-CM

## 2015-11-09 DIAGNOSIS — C259 Malignant neoplasm of pancreas, unspecified: Secondary | ICD-10-CM

## 2015-11-09 DIAGNOSIS — C7800 Secondary malignant neoplasm of unspecified lung: Principal | ICD-10-CM

## 2015-11-09 DIAGNOSIS — R197 Diarrhea, unspecified: Secondary | ICD-10-CM

## 2015-11-09 DIAGNOSIS — C7802 Secondary malignant neoplasm of left lung: Secondary | ICD-10-CM | POA: Diagnosis not present

## 2015-11-09 DIAGNOSIS — R110 Nausea: Secondary | ICD-10-CM | POA: Diagnosis not present

## 2015-11-09 DIAGNOSIS — D6481 Anemia due to antineoplastic chemotherapy: Secondary | ICD-10-CM

## 2015-11-09 DIAGNOSIS — Z7901 Long term (current) use of anticoagulants: Secondary | ICD-10-CM | POA: Diagnosis not present

## 2015-11-09 DIAGNOSIS — T451X5A Adverse effect of antineoplastic and immunosuppressive drugs, initial encounter: Secondary | ICD-10-CM

## 2015-11-09 LAB — COMPREHENSIVE METABOLIC PANEL
ALT: 13 U/L — ABNORMAL LOW (ref 14–54)
AST: 17 U/L (ref 15–41)
Albumin: 3.8 g/dL (ref 3.5–5.0)
Alkaline Phosphatase: 98 U/L (ref 38–126)
Anion gap: 6 (ref 5–15)
BUN: 21 mg/dL — ABNORMAL HIGH (ref 6–20)
CHLORIDE: 110 mmol/L (ref 101–111)
CO2: 21 mmol/L — AB (ref 22–32)
Calcium: 9.3 mg/dL (ref 8.9–10.3)
Creatinine, Ser: 1.34 mg/dL — ABNORMAL HIGH (ref 0.44–1.00)
GFR, EST AFRICAN AMERICAN: 43 mL/min — AB (ref >60)
GFR, EST NON AFRICAN AMERICAN: 37 mL/min — AB (ref >60)
Glucose, Bld: 101 mg/dL — ABNORMAL HIGH (ref 65–99)
POTASSIUM: 4.5 mmol/L (ref 3.5–5.1)
SODIUM: 137 mmol/L (ref 135–145)
Total Bilirubin: 0.3 mg/dL (ref 0.3–1.2)
Total Protein: 6.4 g/dL — ABNORMAL LOW (ref 6.5–8.1)

## 2015-11-09 LAB — CBC WITH DIFFERENTIAL/PLATELET
BASOS ABS: 0 10*3/uL (ref 0.0–0.1)
BASOS PCT: 0 %
EOS ABS: 0.1 10*3/uL (ref 0.0–0.7)
EOS PCT: 2 %
HEMATOCRIT: 28.5 % — AB (ref 36.0–46.0)
Hemoglobin: 9.3 g/dL — ABNORMAL LOW (ref 12.0–15.0)
Lymphocytes Relative: 33 %
Lymphs Abs: 1.3 10*3/uL (ref 0.7–4.0)
MCH: 30.5 pg (ref 26.0–34.0)
MCHC: 32.6 g/dL (ref 30.0–36.0)
MCV: 93.4 fL (ref 78.0–100.0)
MONO ABS: 0.2 10*3/uL (ref 0.1–1.0)
Monocytes Relative: 5 %
NEUTROS ABS: 2.5 10*3/uL (ref 1.7–7.7)
Neutrophils Relative %: 60 %
PLATELETS: 127 10*3/uL — AB (ref 150–400)
RBC: 3.05 MIL/uL — ABNORMAL LOW (ref 3.87–5.11)
RDW: 14 % (ref 11.5–15.5)
WBC: 4 10*3/uL (ref 4.0–10.5)

## 2015-11-09 NOTE — Patient Instructions (Addendum)
[place] at [facility] Discharge Instructions  RECOMMENDATIONS MADE BY THE CONSULTANT AND ANY TEST RESULTS WILL BE SENT TO YOUR REFERRING PHYSICIAN.  You saw Dr. Whitney Muse today. Treatment and labs next week. Follow up with MD and labs in 2 weeks.  Thank you for choosing [place] at [facility] to provide your oncology and hematology care.  To afford each patient quality time with our provider, please arrive at least 15 minutes before your scheduled appointment time.   Beginning January 23rd 2017 lab work for the [facility] will be done in the  Main lab at [facility] on 1st floor. If you have a lab appointment with the [facility] please come in thru the  Main Entrance and check in at the main information desk  You need to re-schedule your appointment should you arrive 10 or more minutes late.  We strive to give you quality time with our providers, and arriving late affects you and other patients whose appointments are after yours.  Also, if you no show three or more times for appointments you may be dismissed from the clinic at the providers discretion.     Again, thank you for choosing [facility].  Our hope is that these requests will decrease the amount of time that you wait before being seen by our physicians.       _____________________________________________________________  Should you have questions after your visit to [facility], please contact our office at [phone] between the hours of 8:30 a.m. and 4:30 p.m.  Voicemails left after 4:30 p.m. will not be returned until the following business day.  For prescription refill requests, have your pharmacy contact our office.         Resources For Cancer Patients and their Caregivers ? American Cancer Society: Can assist with transportation, wigs, general needs, runs Look Good Feel Better.        559-332-7694 ? Cancer Care: Provides financial  assistance, online support groups, medication/co-pay assistance.  1-800-813-HOPE 5023540863) ? [place] Assists [place] cancer patients and their families through emotional , educational and financial support.  9472160929 ? [place] DSS Where to apply for food stamps, Medicaid and utility assistance. 445-798-2420 ? RCATS: Transportation to medical appointments. 416-054-6425 ? Social Security Administration: May apply for disability if have a Stage IV cancer. 417-882-7945 [phone] ? [employer], Disability and Transit Services: Assists with nutrition, care and transit needs. [place] Support Programs: '@10RELATIVEDAYS'$ @ > Cancer Support Group  2nd Tuesday of the month 1pm-2pm, Journey Room  > Creative Journey  3rd Tuesday of the month 1130am-1pm, Journey Room  > Look Good Feel Better  1st Wednesday of the month 10am-12 noon, Journey Room (Call [place] to register [phone])

## 2015-11-16 ENCOUNTER — Encounter (HOSPITAL_BASED_OUTPATIENT_CLINIC_OR_DEPARTMENT_OTHER): Payer: Medicare Other

## 2015-11-16 VITALS — BP 120/56 | HR 61 | Temp 98.2°F | Resp 18 | Wt 129.2 lb

## 2015-11-16 DIAGNOSIS — C259 Malignant neoplasm of pancreas, unspecified: Secondary | ICD-10-CM | POA: Diagnosis not present

## 2015-11-16 DIAGNOSIS — C7800 Secondary malignant neoplasm of unspecified lung: Principal | ICD-10-CM

## 2015-11-16 DIAGNOSIS — Z5111 Encounter for antineoplastic chemotherapy: Secondary | ICD-10-CM

## 2015-11-16 DIAGNOSIS — C7802 Secondary malignant neoplasm of left lung: Secondary | ICD-10-CM

## 2015-11-16 LAB — CBC WITH DIFFERENTIAL/PLATELET
BASOS ABS: 0 10*3/uL (ref 0.0–0.1)
BASOS PCT: 0 %
EOS ABS: 0 10*3/uL (ref 0.0–0.7)
EOS PCT: 1 %
HCT: 28.1 % — ABNORMAL LOW (ref 36.0–46.0)
HEMOGLOBIN: 9.1 g/dL — AB (ref 12.0–15.0)
Lymphocytes Relative: 26 %
Lymphs Abs: 1.1 10*3/uL (ref 0.7–4.0)
MCH: 30.4 pg (ref 26.0–34.0)
MCHC: 32.4 g/dL (ref 30.0–36.0)
MCV: 94 fL (ref 78.0–100.0)
Monocytes Absolute: 0.5 10*3/uL (ref 0.1–1.0)
Monocytes Relative: 11 %
NEUTROS PCT: 63 %
Neutro Abs: 2.7 10*3/uL (ref 1.7–7.7)
PLATELETS: 114 10*3/uL — AB (ref 150–400)
RBC: 2.99 MIL/uL — AB (ref 3.87–5.11)
RDW: 14.5 % (ref 11.5–15.5)
WBC: 4.3 10*3/uL (ref 4.0–10.5)

## 2015-11-16 LAB — COMPREHENSIVE METABOLIC PANEL
ALBUMIN: 3.7 g/dL (ref 3.5–5.0)
ALK PHOS: 103 U/L (ref 38–126)
ALT: 14 U/L (ref 14–54)
AST: 20 U/L (ref 15–41)
Anion gap: 7 (ref 5–15)
BUN: 20 mg/dL (ref 6–20)
CHLORIDE: 112 mmol/L — AB (ref 101–111)
CO2: 20 mmol/L — AB (ref 22–32)
CREATININE: 1.35 mg/dL — AB (ref 0.44–1.00)
Calcium: 8.8 mg/dL — ABNORMAL LOW (ref 8.9–10.3)
GFR calc non Af Amer: 37 mL/min — ABNORMAL LOW (ref >60)
GFR, EST AFRICAN AMERICAN: 43 mL/min — AB (ref >60)
GLUCOSE: 137 mg/dL — AB (ref 65–99)
Potassium: 4 mmol/L (ref 3.5–5.1)
SODIUM: 139 mmol/L (ref 135–145)
Total Bilirubin: 0.7 mg/dL (ref 0.3–1.2)
Total Protein: 6.3 g/dL — ABNORMAL LOW (ref 6.5–8.1)

## 2015-11-16 MED ORDER — SODIUM CHLORIDE 0.9 % IV SOLN
35.0000 mg/m2 | Freq: Once | INTRAVENOUS | Status: AC
  Administered 2015-11-16: 55.9 mg via INTRAVENOUS
  Filled 2015-11-16: qty 13

## 2015-11-16 MED ORDER — SODIUM CHLORIDE 0.9% FLUSH
10.0000 mL | INTRAVENOUS | Status: DC | PRN
  Administered 2015-11-16: 10 mL
  Filled 2015-11-16: qty 10

## 2015-11-16 MED ORDER — PALONOSETRON HCL INJECTION 0.25 MG/5ML
0.2500 mg | Freq: Once | INTRAVENOUS | Status: AC
  Administered 2015-11-16: 0.25 mg via INTRAVENOUS
  Filled 2015-11-16: qty 5

## 2015-11-16 MED ORDER — SODIUM CHLORIDE 0.9 % IV SOLN
Freq: Once | INTRAVENOUS | Status: AC
  Administered 2015-11-16: 10:00:00 via INTRAVENOUS

## 2015-11-16 MED ORDER — SODIUM CHLORIDE 0.9 % IV SOLN
1500.0000 mg/m2 | INTRAVENOUS | Status: DC
  Administered 2015-11-16: 2450 mg via INTRAVENOUS
  Filled 2015-11-16: qty 49

## 2015-11-16 MED ORDER — PROCHLORPERAZINE MALEATE 10 MG PO TABS
10.0000 mg | ORAL_TABLET | Freq: Once | ORAL | Status: AC
  Administered 2015-11-16: 10 mg via ORAL
  Filled 2015-11-16: qty 1

## 2015-11-16 MED ORDER — LEUCOVORIN CALCIUM INJECTION 350 MG
400.0000 mg/m2 | Freq: Once | INTRAVENOUS | Status: AC
  Administered 2015-11-16: 652 mg via INTRAVENOUS
  Filled 2015-11-16: qty 17.5

## 2015-11-16 NOTE — Patient Instructions (Signed)
[facility] Discharge Instructions for Patients Receiving Chemotherapy   Beginning January 23rd 2017 lab work for the [facility] will be done in the  Main lab at [facility] on 1st floor. If you have a lab appointment with the [facility] please come in thru the  Main Entrance and check in at the main information desk   Today you received the following chemotherapy agents Irinotecan,Leucovorin and 5FU. Follow-up as scheduled. Call clinic for any questions or concerns  To help prevent nausea and vomiting after your treatment, we encourage you to take your nausea medication   If you develop nausea and vomiting, or diarrhea that is not controlled by your medication, call the clinic.  The clinic phone number is [phone]. Office hours are Monday-Friday 8:30am-5:00pm.  BELOW ARE SYMPTOMS THAT SHOULD BE REPORTED IMMEDIATELY:  *FEVER GREATER THAN 101.0 F  *CHILLS WITH OR WITHOUT FEVER  NAUSEA AND VOMITING THAT IS NOT CONTROLLED WITH YOUR NAUSEA MEDICATION  *UNUSUAL SHORTNESS OF BREATH  *UNUSUAL BRUISING OR BLEEDING  TENDERNESS IN MOUTH AND THROAT WITH OR WITHOUT PRESENCE OF ULCERS  *URINARY PROBLEMS  *BOWEL PROBLEMS  UNUSUAL RASH Items with * indicate a potential emergency and should be followed up as soon as possible. If you have an emergency after office hours please contact your primary care physician or go to the nearest emergency department.  Please call the clinic during office hours if you have any questions or concerns.   You may also contact the Patient Navigator at [phone] should you have any questions or need assistance in obtaining follow up care.      Resources For Cancer Patients and their Caregivers ? American Cancer Society: Can assist with transportation, wigs, general needs, runs Look Good Feel Better.        ([phone] ? Cancer Care: Provides financial assistance, online support groups, medication/co-pay assistance.   1-800-813-HOPE (772)848-1813) ? [place] Assists [place] cancer patients and their families through emotional , educational and financial support.  331-147-7930 ? [place] DSS Where to apply for food stamps, Medicaid and utility assistance. 386-670-0866 ? RCATS: Transportation to medical appointments. 3120902910 ? Social Security Administration: May apply for disability if have a Stage IV cancer. 4192353019 910-028-8724 ? [employer], Disability and Transit Services: Assists with nutrition, care and transit needs. [phone]

## 2015-11-16 NOTE — Progress Notes (Signed)
[name] [name] tolerated chemo tx well without complaints or incident. Pt discharged with 5FU pump infusing without issues. VSS upon discharge. Pt discharged self ambulatory in satisfactory condition with sister

## 2015-11-17 LAB — CANCER ANTIGEN 19-9: CA 19 9: 625 U/mL — AB (ref 0–35)

## 2015-11-18 ENCOUNTER — Encounter (HOSPITAL_COMMUNITY): Payer: Medicare Other

## 2015-11-18 ENCOUNTER — Encounter (HOSPITAL_BASED_OUTPATIENT_CLINIC_OR_DEPARTMENT_OTHER): Payer: Medicare Other

## 2015-11-18 VITALS — BP 128/56 | HR 57 | Temp 97.9°F | Resp 18

## 2015-11-18 DIAGNOSIS — C259 Malignant neoplasm of pancreas, unspecified: Secondary | ICD-10-CM | POA: Diagnosis not present

## 2015-11-18 DIAGNOSIS — C7802 Secondary malignant neoplasm of left lung: Secondary | ICD-10-CM

## 2015-11-18 DIAGNOSIS — Z452 Encounter for adjustment and management of vascular access device: Secondary | ICD-10-CM

## 2015-11-18 DIAGNOSIS — C7800 Secondary malignant neoplasm of unspecified lung: Principal | ICD-10-CM

## 2015-11-18 MED ORDER — HEPARIN SOD (PORK) LOCK FLUSH 100 UNIT/ML IV SOLN
500.0000 [IU] | Freq: Once | INTRAVENOUS | Status: AC | PRN
  Administered 2015-11-18: 500 [IU]

## 2015-11-18 MED ORDER — SODIUM CHLORIDE 0.9% FLUSH: 10.0000 mL | INTRAVENOUS | Status: DC | PRN

## 2015-11-18 MED ORDER — HEPARIN SOD (PORK) LOCK FLUSH 100 UNIT/ML IV SOLN
INTRAVENOUS | Status: AC
  Filled 2015-11-18: qty 5

## 2015-11-18 MED ORDER — HEPARIN SOD (PORK) LOCK FLUSH 100 UNIT/ML IV SOLN: 500.0000 [IU] | Freq: Once | INTRAVENOUS | Status: DC

## 2015-11-18 MED ORDER — SODIUM CHLORIDE 0.9% FLUSH
10.0000 mL | INTRAVENOUS | Status: DC | PRN
  Administered 2015-11-18: 10 mL
  Filled 2015-11-18: qty 10

## 2015-11-18 NOTE — Progress Notes (Signed)
[name] [name] tolerated 5FU pump well. Pump discontinued today without complaints or incident and port flushed with 10 ml NS and 5 ml Heparin easily.VSS. Pt informed about her tumor marker which had decreased per MD request.Pt discharged self ambulatory in satisfactory condition

## 2015-11-18 NOTE — Patient Instructions (Signed)
[place] at [facility] Discharge Instructions  RECOMMENDATIONS MADE BY THE CONSULTANT AND ANY TEST RESULTS WILL BE SENT TO YOUR REFERRING PHYSICIAN.  5FU pump discontinued today. Follow-up as scheduled. Call clinic for any questions or concerns  Thank you for choosing [place] at [facility] to provide your oncology and hematology care.  To afford each patient quality time with our provider, please arrive at least 15 minutes before your scheduled appointment time.   Beginning January 23rd 2017 lab work for the [facility] will be done in the  Main lab at [facility] on 1st floor. If you have a lab appointment with the [facility] please come in thru the  Main Entrance and check in at the main information desk  You need to re-schedule your appointment should you arrive 10 or more minutes late.  We strive to give you quality time with our providers, and arriving late affects you and other patients whose appointments are after yours.  Also, if you no show three or more times for appointments you may be dismissed from the clinic at the providers discretion.     Again, thank you for choosing [facility].  Our hope is that these requests will decrease the amount of time that you wait before being seen by our physicians.       _____________________________________________________________  Should you have questions after your visit to [facility], please contact our office at [phone] between the hours of 8:30 a.m. and 4:30 p.m.  Voicemails left after 4:30 p.m. will not be returned until the following business day.  For prescription refill requests, have your pharmacy contact our office.         Resources For Cancer Patients and their Caregivers ? American Cancer Society: Can assist with transportation, wigs, general needs, runs Look Good Feel Better.        ([phone] ? Cancer Care: Provides financial  assistance, online support groups, medication/co-pay assistance.  1-800-813-HOPE 780-107-1402) ? [place] Assists [place] cancer patients and their families through emotional , educational and financial support.  (781)020-6183 ? [place] DSS Where to apply for food stamps, Medicaid and utility assistance. 2497642553 ? RCATS: Transportation to medical appointments. 215-533-6434 ? Social Security Administration: May apply for disability if have a Stage IV cancer. [phone])402-1790 [phone] ? [employer], Disability and Transit Services: Assists with nutrition, care and transit needs. [place] Support Programs: '@10RELATIVEDAYS'$ @ > Cancer Support Group  2nd Tuesday of the month 1pm-2pm, Journey Room  > Creative Journey  3rd Tuesday of the month 1130am-1pm, Journey Room  > Look Good Feel Better  1st Wednesday of the month 10am-12 noon, Journey Room (Call [place] to register [phone])

## 2015-11-23 ENCOUNTER — Other Ambulatory Visit (HOSPITAL_COMMUNITY): Payer: Self-pay

## 2015-11-23 DIAGNOSIS — C7800 Secondary malignant neoplasm of unspecified lung: Principal | ICD-10-CM

## 2015-11-23 DIAGNOSIS — C259 Malignant neoplasm of pancreas, unspecified: Secondary | ICD-10-CM

## 2015-11-24 ENCOUNTER — Encounter (HOSPITAL_COMMUNITY): Payer: Self-pay | Admitting: Oncology

## 2015-11-24 ENCOUNTER — Encounter (HOSPITAL_COMMUNITY): Payer: Medicare Other

## 2015-11-24 ENCOUNTER — Encounter (HOSPITAL_COMMUNITY): Payer: Medicare Other | Attending: Adult Health | Admitting: Oncology

## 2015-11-24 ENCOUNTER — Other Ambulatory Visit (HOSPITAL_COMMUNITY): Payer: Self-pay

## 2015-11-24 VITALS — BP 132/52 | HR 60 | Temp 98.0°F | Resp 20 | Ht 64.5 in | Wt 127.0 lb

## 2015-11-24 DIAGNOSIS — R634 Abnormal weight loss: Secondary | ICD-10-CM | POA: Diagnosis not present

## 2015-11-24 DIAGNOSIS — C259 Malignant neoplasm of pancreas, unspecified: Secondary | ICD-10-CM

## 2015-11-24 DIAGNOSIS — C7800 Secondary malignant neoplasm of unspecified lung: Principal | ICD-10-CM

## 2015-11-24 DIAGNOSIS — R197 Diarrhea, unspecified: Secondary | ICD-10-CM | POA: Insufficient documentation

## 2015-11-24 DIAGNOSIS — Z95828 Presence of other vascular implants and grafts: Secondary | ICD-10-CM | POA: Diagnosis not present

## 2015-11-24 LAB — COMPREHENSIVE METABOLIC PANEL
ALK PHOS: 118 U/L (ref 38–126)
ALT: 16 U/L (ref 14–54)
AST: 21 U/L (ref 15–41)
Albumin: 3.9 g/dL (ref 3.5–5.0)
Anion gap: 6 (ref 5–15)
BUN: 24 mg/dL — ABNORMAL HIGH (ref 6–20)
CALCIUM: 9.3 mg/dL (ref 8.9–10.3)
CO2: 21 mmol/L — AB (ref 22–32)
CREATININE: 1.22 mg/dL — AB (ref 0.44–1.00)
Chloride: 109 mmol/L (ref 101–111)
GFR, EST AFRICAN AMERICAN: 48 mL/min — AB (ref >60)
GFR, EST NON AFRICAN AMERICAN: 42 mL/min — AB (ref >60)
Glucose, Bld: 107 mg/dL — ABNORMAL HIGH (ref 65–99)
Potassium: 4.5 mmol/L (ref 3.5–5.1)
SODIUM: 136 mmol/L (ref 135–145)
Total Bilirubin: 0.4 mg/dL (ref 0.3–1.2)
Total Protein: 7 g/dL (ref 6.5–8.1)

## 2015-11-24 LAB — SAMPLE TO BLOOD BANK

## 2015-11-24 NOTE — Assessment & Plan Note (Addendum)
Stage IV pancreatic carcinoma, she is also reported to have a history of stage IV BAC of the lungs. (On further review I feel she may have had stage I BAC of the lungs).  She is currently on  liposomal irinotecan/5-FU (dose reduced).  Oncology history is up to date.  Labs today: CBC diff, CMET.  I personally reviewed and went over laboratory results with the patient.  The results are noted within this dictation.  We reviewed her CA 19-9 marker which has declined with treatment.  She is due for her next treatment on 11/30/2015.  Given her slow recovery from her last treatment, she may consider delaying treatment x 1 week.  We had a good conversation and this is what I recommended.  I think a 1 week delay is very reasonable.  We discussed goals of care and she was very frank that quality of life is most important to her.  She is educated that we must always weight the risks and benefits of ongoing treatment and therefore, delays or stopping treatment are reasonable and will most likely be needed.  Weight loss is noted and Burtis Junes, RD is notified.  Return in ~ 2 weeks for follow-up with labs.

## 2015-11-24 NOTE — Patient Instructions (Signed)
[place] at [facility] Discharge Instructions  RECOMMENDATIONS MADE BY THE CONSULTANT AND ANY TEST RESULTS WILL BE SENT TO YOUR REFERRING PHYSICIAN.  You were seen by Kirby Crigler PA-C today. Labs drawn today. Treatment Monday as scheduled.  Follow up in 2 weeks with provider.    Thank you for choosing [place] at [facility] to provide your oncology and hematology care.  To afford each patient quality time with our provider, please arrive at least 15 minutes before your scheduled appointment time.   Beginning January 23rd 2017 lab work for the [facility] will be done in the  Main lab at [facility] on 1st floor. If you have a lab appointment with the [facility] please come in thru the  Main Entrance and check in at the main information desk  You need to re-schedule your appointment should you arrive 10 or more minutes late.  We strive to give you quality time with our providers, and arriving late affects you and other patients whose appointments are after yours.  Also, if you no show three or more times for appointments you may be dismissed from the clinic at the providers discretion.     Again, thank you for choosing [facility].  Our hope is that these requests will decrease the amount of time that you wait before being seen by our physicians.       _____________________________________________________________  Should you have questions after your visit to [facility], please contact our office at [phone] between the hours of 8:30 a.m. and 4:30 p.m.  Voicemails left after 4:30 p.m. will not be returned until the following business day.  For prescription refill requests, have your pharmacy contact our office.         Resources For Cancer Patients and their Caregivers ? American Cancer Society: Can assist with transportation, wigs, general needs, runs Look Good Feel Better.         816-137-5134 ? Cancer Care: Provides financial assistance, online support groups, medication/co-pay assistance.  1-800-813-HOPE (312)217-8303) ? [place] Assists [place] cancer patients and their families through emotional , educational and financial support.  (213)047-5570 ? [place] DSS Where to apply for food stamps, Medicaid and utility assistance. (450)496-6398 ? RCATS: Transportation to medical appointments. (630)010-6757 ? Social Security Administration: May apply for disability if have a Stage IV cancer. [phone] 636-278-5425 ? [employer], Disability and Transit Services: Assists with nutrition, care and transit needs. [place] Support Programs: '@10RELATIVEDAYS'$ @ > Cancer Support Group  2nd Tuesday of the month 1pm-2pm, Journey Room  > Creative Journey  3rd Tuesday of the month 1130am-1pm, Journey Room  > Look Good Feel Better  1st Wednesday of the month 10am-12 noon, Journey Room (Call [place] to register [phone])

## 2015-11-24 NOTE — Progress Notes (Signed)
[facility], [place] [address]  Pancreatic cancer metastasized to lung [facility]) - Plan: Cancer antigen 19-9  CURRENT THERAPY: Liposome Irinotecan/5FU/Leucovorin  INTERVAL HISTORY: [name] [name] 75 y.o. female returns for followup of stage IV pancreatic carcinoma, she is also reported to have a history of stage IV BAC of the lungs. (On further review I feel she may have had stage I BAC of the lungs).  She is currently on  liposomal irinotecan/5-FU.      Pancreatic cancer metastasized to lung [facility])   09/03/2008 Surgery    Whipple with Dr. Birdie Sons for T3N1 3/12 LN+ Pancreatic Adenocarcinoma      11/13/2008 - 09/12/2009 Chemotherapy    Adjuvant Gemcitabine X 6 months      08/31/2009 Surgery    Metastectomy with Dr. Arlyce Dice RUL 2 nodules, RLL (wedge resection X 2)      08/31/2009 Pathology Results    Metastatic adenocarcinoma TTF-1 negative      06/03/2010 Surgery    Left video-assisted thoracoscopic surgery, resection of left lower lobe lesion. with Dr. Arlyce Dice      06/04/2010 Pathology Results    adenocarcinoma positive for cytokeratin 7, focally positive for CDX2, with scattered staining for TTF1 cytokeratin 20. [facility] opinion felt to be lung primary although noted to stain for GI markers, not felt to be pancreatic primary. EGFR and ALK negative      03/31/2011 - 09/04/2011 Radiation Therapy    SBRT LUL 18Gy Dr. Tammi Klippel      03/15/2012 - 06/13/2012 Chemotherapy    5-FU based chemotherapy      02/13/2013 - 05/20/2013 Chemotherapy    Gemzar Abraxane for metastatic disease (given to cover both primaries at [facility])      07/15/2014 PET scan    Mild progression of pulm mets, hypermet mesenteric tumor assoc with  SMA is stable to slightly increased in degree of FDG uptake      07/23/2014 Imaging         07/30/2014 - 09/10/2014 Chemotherapy    Gemzar abraxane stopped due to poor tolerance      04/06/2015 Procedure    L 20 French chest tube  for spontaneous pneumothorax, by Dr. Tharon Aquas Trigt      04/20/2015 Pathology Results    High grade carcinoma c/w patient's known pancreatic primary (secondary opinion at [facility]) PDL-1 high expression, preserved major and minor MMR, MSI stable, [facility] ONE testing performed,       04/20/2015 Surgery    Left VATS, mini thoracotomy with stapling and oversewing of blebs, Talc pleurodesis. performed secondary to persistent air leak not improved with chest tube drainage      07/29/2015 Imaging    No signif change in appear of multifocal B cavitary pulmonary lesions, postop appearance upper abdomen, no findings of met disease to abd or pelvis,      09/29/2015 Imaging    Slight progression of multifocal cavitary pulmonary metastases, as discussed above. 2. Interval development of a lytic lesion in the antral lateral aspect of the left fifth rib where there is a nondisplaced pathologic fracture. No other definite osseous metastases are identified. 3. Prominent soft tissue adjacent to the proximal superior mesenteric artery and in the aortocaval nodal station, similar to prior examinations, likely to represent treated metastatic lymphadenopathy. 4. Epigastric ventral hernia containing several short loops of small bowel. There is a focally dilated loop of small bowel adjacent to this, however, this is an isolated  finding. No other dilatation of small bowel or colon to suggest frank bowel obstruction at this time.       10/19/2015 -  Chemotherapy    The patient had palonosetron (ALOXI) injection 0.25 mg, 0.25 mg, Intravenous,  Once, 1 of 4 cycles  leucovorin 652 mg in dextrose 5 % 250 mL infusion, 400 mg/m2 = 652 mg, Intravenous,  Once, 1 of 4 cycles  fluorouracil (ADRUCIL) 2,950 mg in sodium chloride 0.9 % 91 mL chemo infusion, 1,800 mg/m2 = 2,950 mg (100 % of original dose 1,800 mg/m2), Intravenous, 1 Day/Dose, 1 of 4 cycles Dose modification: 1,920 mg/m2 (original dose  1,800 mg/m2, Cycle 1, Reason: Provider Judgment), 1,800 mg/m2 (original dose 1,800 mg/m2, Cycle 1, Reason: Provider Judgment)  irinotecan LIPOSOME (ONIVYDE) 55.9 mg in sodium chloride 0.9 % 500 mL chemo infusion, 35 mg/m2 = 55.9 mg (100 % of original dose 35 mg/m2), Intravenous, Once, 1 of 4 cycles Dose modification: 35 mg/m2 (original dose 35 mg/m2, Cycle 1, Reason: Provider Judgment)  for chemotherapy treatment.        Weight is down 2 lbs but stable compared to 9/18.  She notes a poor recovery from his last treatment.  She notes some BRBPR on toilet paper only.  This is infrequent but new.  She denies constipation.    She notes some abdominal soreness in her low abdomen.  She denies any fevers or chills.  She denies any urinary complaints.  She notes some flatulence.  She notes fatigue, and she reports that it "may be below baseline."  Review of Systems  Constitutional: Positive for malaise/fatigue and weight loss. Negative for chills and fever.  HENT: Negative.   Eyes: Negative.   Respiratory: Negative.   Cardiovascular: Negative.   Gastrointestinal: Positive for abdominal pain. Negative for constipation, diarrhea, nausea and vomiting.  Genitourinary: Negative.   Musculoskeletal: Negative.   Skin: Negative.   Neurological: Positive for weakness.  Endo/Heme/Allergies: Negative.   Psychiatric/Behavioral: Negative.     Past Medical History:  Diagnosis Date  . Abdominal wall hernia   . Aortic aneurysm (HCC)    small aortic aneurysm  . Cancer [facility]) July 2010   Pancreatic adenocarcinoma  . Cataract 2010   b/l surgery  . Cervical cancer ([place]) 1963  . COPD (chronic obstructive pulmonary disease) ([place])   . Emphysema   . Hypercholesterolemia   . Hypertension   . Pancreatic cancer metastasized to lung [facility]) 09/04/2015    Past Surgical History:  Procedure Laterality Date  . BREAST SURGERY  1988   Right breast abcess  . BUNIONECTOMY  2007  . CHOLECYSTECTOMY  July 2010    done w/ Whipple procedure  . EYE SURGERY  2009   Bilateral cataract  . STAPLING OF BLEBS Left 04/20/2015   Procedure: STAPLING OF BLEBS;  Surgeon: Ivin Poot, MD;  Location: [place];  Service: Thoracic;  Laterality: Left;  . TONSILLECTOMY  1943  . VAGINAL HYSTERECTOMY  1963   Cervical Cancer  . VIDEO ASSISTED THORACOSCOPY Left 04/20/2015   Procedure: VIDEO ASSISTED THORACOSCOPY;  Surgeon: Ivin Poot, MD;  Location: [place];  Service: Thoracic;  Laterality: Left;  . WHIPPLE PROCEDURE  July 2010   pancreatic adenocarcinoma    Family History  Problem Relation Age of Onset  . Pancreatic cancer Brother   . Colon cancer Sister 68    12/2006 dx surgery and chemotherapy  . Ovarian cancer Sister 35    Ovarian ca,  surgery and chemotherapy  Social History   Social History  . Marital status: Widowed    Spouse name: N/A  . Number of children: 0  . Years of education: N/A   Occupational History  .  Retired   Social History Main Topics  . Smoking status: Former Smoker    Packs/day: 1.00    Years: 30.00    Types: Cigarettes    Quit date: 02/29/1996  . Smokeless tobacco: Never Used  . Alcohol use No  . Drug use: No  . Sexual activity: No   Other Topics Concern  . None   Social History Narrative  . None     PHYSICAL EXAMINATION  ECOG PERFORMANCE STATUS: 1 - Symptomatic but completely ambulatory  Vitals:   11/24/15 1354  BP: (!) 132/52  Pulse: 60  Resp: 20  Temp: 98 F (36.7 C)    GENERAL:alert, cooperative, smiling and accompanied by her sister. SKIN: skin color, texture, turgor are normal, no rashes or significant lesions HEAD: Normocephalic, No masses, lesions, tenderness or abnormalities EYES: normal, EOMI, Conjunctiva are pink and non-injected EARS: External ears normal OROPHARYNX:lips, buccal mucosa, and tongue normal and mucous membranes are moist  NECK: supple, trachea midline LYMPH:  not examined BREAST:not examined LUNGS: clear to auscultation    HEART: regular rate & rhythm ABDOMEN:abdomen soft, non-tender and normal bowel sounds BACK: Back symmetric, no curvature. EXTREMITIES:less then 2 second capillary refill, no joint deformities, effusion, or inflammation, no skin discoloration, no cyanosis  NEURO: alert & oriented x 3 with fluent speech, no focal motor/sensory deficits, gait normal  LABORATORY DATA: CBC    Component Value Date/Time   WBC 7.5 11/24/2015 1419   RBC 3.27 (L) 11/24/2015 1419   HGB 9.8 (L) 11/24/2015 1419   HCT 30.1 (L) 11/24/2015 1419   PLT 118 (L) 11/24/2015 1419   MCV 92.0 11/24/2015 1419   MCH 30.0 11/24/2015 1419   MCHC 32.6 11/24/2015 1419   RDW 14.8 11/24/2015 1419   LYMPHSABS PENDING 11/24/2015 1419   MONOABS PENDING 11/24/2015 1419   EOSABS PENDING 11/24/2015 1419   BASOSABS PENDING 11/24/2015 1419      Chemistry      Component Value Date/Time   NA 136 11/24/2015 1419   K 4.5 11/24/2015 1419   CL 109 11/24/2015 1419   CO2 21 (L) 11/24/2015 1419   BUN 24 (H) 11/24/2015 1419   CREATININE 1.22 (H) 11/24/2015 1419      Component Value Date/Time   CALCIUM 9.3 11/24/2015 1419   ALKPHOS 118 11/24/2015 1419   AST 21 11/24/2015 1419   ALT 16 11/24/2015 1419   BILITOT 0.4 11/24/2015 1419       PENDING LABS:   RADIOGRAPHIC STUDIES:  No results found.   PATHOLOGY:    ASSESSMENT AND PLAN:  Pancreatic cancer metastasized to lung (HCC) Stage IV pancreatic carcinoma, she is also reported to have a history of stage IV BAC of the lungs. (On further review I feel she may have had stage I BAC of the lungs).  She is currently on  liposomal irinotecan/5-FU (dose reduced).  Oncology history is up to date.  Labs today: CBC diff, CMET.  I personally reviewed and went over laboratory results with the patient.  The results are noted within this dictation.  We reviewed her CA 19-9 marker which has declined with treatment.  She is due for her next treatment on 11/30/2015.  Given her slow  recovery from her last treatment, she may consider delaying treatment x 1 week.  We had a good conversation and this is what I recommended.  I think a 1 week delay is very reasonable.  We discussed goals of care and she was very frank that quality of life is most important to her.  She is educated that we must always weight the risks and benefits of ongoing treatment and therefore, delays or stopping treatment are reasonable and will most likely be needed.  Weight loss is noted and Burtis Junes, RD is notified.  Return in ~ 2 weeks for follow-up with labs.     ORDERS PLACED FOR THIS ENCOUNTER: Orders Placed This Encounter  Procedures  . Cancer antigen 19-9    MEDICATIONS PRESCRIBED THIS ENCOUNTER: No orders of the defined types were placed in this encounter.   THERAPY PLAN:  Continue treatment as planned, but the patient may decide to delay treatment x 1 week based upon how she feels over the next 48 hours.  All questions were answered. The patient knows to call the clinic with any problems, questions or concerns. We can certainly see the patient much sooner if necessary.  Patient and plan discussed with Dr. Ancil Linsey and she is in agreement with the aforementioned.   This note is electronically signed by: Doy Mince 11/24/2015 6:45 PM

## 2015-11-26 LAB — CBC WITH DIFFERENTIAL/PLATELET
BASOS PCT: 0 %
Basophils Absolute: 0 10*3/uL (ref 0.0–0.1)
EOS PCT: 1 %
Eosinophils Absolute: 0.1 10*3/uL (ref 0.0–0.7)
HCT: 30.1 % — ABNORMAL LOW (ref 36.0–46.0)
HEMOGLOBIN: 9.8 g/dL — AB (ref 12.0–15.0)
LYMPHS ABS: 1.3 10*3/uL (ref 0.7–4.0)
Lymphocytes Relative: 17 %
MCH: 30 pg (ref 26.0–34.0)
MCHC: 32.6 g/dL (ref 30.0–36.0)
MCV: 92 fL (ref 78.0–100.0)
MONOS PCT: 6 %
Monocytes Absolute: 0.5 10*3/uL (ref 0.1–1.0)
NEUTROS ABS: 5.6 10*3/uL (ref 1.7–7.7)
Neutrophils Relative %: 76 %
Platelets: 118 10*3/uL — ABNORMAL LOW (ref 150–400)
RBC: 3.27 MIL/uL — AB (ref 3.87–5.11)
RDW: 14.8 % (ref 11.5–15.5)
WBC: 7.5 10*3/uL (ref 4.0–10.5)

## 2015-11-30 ENCOUNTER — Ambulatory Visit (HOSPITAL_COMMUNITY): Payer: Medicare Other

## 2015-12-02 ENCOUNTER — Encounter (HOSPITAL_COMMUNITY): Payer: Medicare Other

## 2015-12-08 ENCOUNTER — Encounter: Payer: Self-pay | Admitting: Dietician

## 2015-12-08 ENCOUNTER — Encounter (HOSPITAL_BASED_OUTPATIENT_CLINIC_OR_DEPARTMENT_OTHER): Payer: Medicare Other

## 2015-12-08 ENCOUNTER — Encounter (HOSPITAL_COMMUNITY): Payer: Self-pay | Admitting: Hematology & Oncology

## 2015-12-08 ENCOUNTER — Encounter (HOSPITAL_BASED_OUTPATIENT_CLINIC_OR_DEPARTMENT_OTHER): Payer: Medicare Other | Admitting: Hematology & Oncology

## 2015-12-08 VITALS — BP 110/62 | HR 59 | Temp 98.1°F | Resp 18

## 2015-12-08 VITALS — BP 125/58 | HR 59 | Temp 97.9°F | Resp 18 | Wt 126.4 lb

## 2015-12-08 DIAGNOSIS — E876 Hypokalemia: Secondary | ICD-10-CM | POA: Diagnosis not present

## 2015-12-08 DIAGNOSIS — Z7901 Long term (current) use of anticoagulants: Secondary | ICD-10-CM

## 2015-12-08 DIAGNOSIS — R530 Neoplastic (malignant) related fatigue: Secondary | ICD-10-CM | POA: Diagnosis not present

## 2015-12-08 DIAGNOSIS — I82C11 Acute embolism and thrombosis of right internal jugular vein: Secondary | ICD-10-CM | POA: Diagnosis not present

## 2015-12-08 DIAGNOSIS — D649 Anemia, unspecified: Secondary | ICD-10-CM | POA: Diagnosis not present

## 2015-12-08 DIAGNOSIS — Z95828 Presence of other vascular implants and grafts: Secondary | ICD-10-CM | POA: Diagnosis not present

## 2015-12-08 DIAGNOSIS — R059 Cough, unspecified: Secondary | ICD-10-CM

## 2015-12-08 DIAGNOSIS — R197 Diarrhea, unspecified: Secondary | ICD-10-CM | POA: Diagnosis not present

## 2015-12-08 DIAGNOSIS — C259 Malignant neoplasm of pancreas, unspecified: Secondary | ICD-10-CM

## 2015-12-08 DIAGNOSIS — R0609 Other forms of dyspnea: Secondary | ICD-10-CM | POA: Diagnosis not present

## 2015-12-08 DIAGNOSIS — C7800 Secondary malignant neoplasm of unspecified lung: Principal | ICD-10-CM

## 2015-12-08 DIAGNOSIS — C7802 Secondary malignant neoplasm of left lung: Secondary | ICD-10-CM | POA: Diagnosis not present

## 2015-12-08 DIAGNOSIS — J910 Malignant pleural effusion: Secondary | ICD-10-CM

## 2015-12-08 DIAGNOSIS — D6481 Anemia due to antineoplastic chemotherapy: Secondary | ICD-10-CM

## 2015-12-08 DIAGNOSIS — Z5111 Encounter for antineoplastic chemotherapy: Secondary | ICD-10-CM

## 2015-12-08 DIAGNOSIS — R05 Cough: Secondary | ICD-10-CM

## 2015-12-08 DIAGNOSIS — T451X5A Adverse effect of antineoplastic and immunosuppressive drugs, initial encounter: Secondary | ICD-10-CM

## 2015-12-08 LAB — COMPREHENSIVE METABOLIC PANEL
ALBUMIN: 3.4 g/dL — AB (ref 3.5–5.0)
ALK PHOS: 114 U/L (ref 38–126)
ALT: 13 U/L — AB (ref 14–54)
AST: 23 U/L (ref 15–41)
Anion gap: 8 (ref 5–15)
BUN: 20 mg/dL (ref 6–20)
CALCIUM: 8.7 mg/dL — AB (ref 8.9–10.3)
CHLORIDE: 111 mmol/L (ref 101–111)
CO2: 19 mmol/L — AB (ref 22–32)
CREATININE: 1.33 mg/dL — AB (ref 0.44–1.00)
GFR calc non Af Amer: 37 mL/min — ABNORMAL LOW (ref >60)
GFR, EST AFRICAN AMERICAN: 43 mL/min — AB (ref >60)
GLUCOSE: 147 mg/dL — AB (ref 65–99)
Potassium: 3.3 mmol/L — ABNORMAL LOW (ref 3.5–5.1)
SODIUM: 138 mmol/L (ref 135–145)
Total Bilirubin: 0.7 mg/dL (ref 0.3–1.2)
Total Protein: 6.4 g/dL — ABNORMAL LOW (ref 6.5–8.1)

## 2015-12-08 LAB — CBC WITH DIFFERENTIAL/PLATELET
BASOS ABS: 0 10*3/uL (ref 0.0–0.1)
BASOS PCT: 0 %
EOS ABS: 0.1 10*3/uL (ref 0.0–0.7)
EOS PCT: 1 %
HCT: 28.6 % — ABNORMAL LOW (ref 36.0–46.0)
HEMOGLOBIN: 9.3 g/dL — AB (ref 12.0–15.0)
LYMPHS ABS: 1.1 10*3/uL (ref 0.7–4.0)
Lymphocytes Relative: 20 %
MCH: 30.4 pg (ref 26.0–34.0)
MCHC: 32.5 g/dL (ref 30.0–36.0)
MCV: 93.5 fL (ref 78.0–100.0)
Monocytes Absolute: 0.7 10*3/uL (ref 0.1–1.0)
Monocytes Relative: 13 %
NEUTROS PCT: 66 %
Neutro Abs: 3.6 10*3/uL (ref 1.7–7.7)
PLATELETS: 144 10*3/uL — AB (ref 150–400)
RBC: 3.06 MIL/uL — AB (ref 3.87–5.11)
RDW: 15.6 % — ABNORMAL HIGH (ref 11.5–15.5)
WBC: 5.5 10*3/uL (ref 4.0–10.5)

## 2015-12-08 MED ORDER — FLUOROURACIL CHEMO INJECTION 5 GM/100ML
1500.0000 mg/m2 | INTRAVENOUS | Status: DC
  Administered 2015-12-08: 2450 mg via INTRAVENOUS
  Filled 2015-12-08: qty 49

## 2015-12-08 MED ORDER — SODIUM CHLORIDE 0.9% FLUSH
10.0000 mL | INTRAVENOUS | Status: DC | PRN
  Administered 2015-12-08: 10 mL
  Filled 2015-12-08: qty 10

## 2015-12-08 MED ORDER — POTASSIUM BICARB-CITRIC ACID 10 MEQ PO TBEF: 10.0000 meq | EFFERVESCENT_TABLET | Freq: Two times a day (BID) | ORAL | 0 refills | Status: DC

## 2015-12-08 MED ORDER — PALONOSETRON HCL INJECTION 0.25 MG/5ML
0.2500 mg | Freq: Once | INTRAVENOUS | Status: AC
  Administered 2015-12-08: 0.25 mg via INTRAVENOUS
  Filled 2015-12-08: qty 5

## 2015-12-08 MED ORDER — HEPARIN SOD (PORK) LOCK FLUSH 100 UNIT/ML IV SOLN: 500.0000 [IU] | Freq: Once | INTRAVENOUS | Status: DC | PRN

## 2015-12-08 MED ORDER — SODIUM CHLORIDE 0.9 % IV SOLN
35.0000 mg/m2 | Freq: Once | INTRAVENOUS | Status: AC
  Administered 2015-12-08: 55.9 mg via INTRAVENOUS
  Filled 2015-12-08: qty 13

## 2015-12-08 MED ORDER — SODIUM CHLORIDE 0.9 % IV SOLN
Freq: Once | INTRAVENOUS | Status: AC
  Administered 2015-12-08: 10:00:00 via INTRAVENOUS

## 2015-12-08 MED ORDER — PROCHLORPERAZINE MALEATE 10 MG PO TABS
10.0000 mg | ORAL_TABLET | Freq: Once | ORAL | Status: AC
  Administered 2015-12-08: 10 mg via ORAL
  Filled 2015-12-08: qty 1

## 2015-12-08 MED ORDER — LEUCOVORIN CALCIUM INJECTION 350 MG
400.0000 mg/m2 | Freq: Once | INTRAMUSCULAR | Status: AC
  Administered 2015-12-08: 652 mg via INTRAVENOUS
  Filled 2015-12-08: qty 32.6

## 2015-12-08 NOTE — Progress Notes (Signed)
Patient identified to be at risk for malnutrition on the MST secondary to poor appetite and weight loss   Contacted Pt by visiting during infusion   Wt Readings from Last 10 Encounters:  12/08/15 126 lb 6.4 oz (57.3 kg)  11/24/15 127 lb (57.6 kg)  11/16/15 129 lb 3.2 oz (58.6 kg)  11/09/15 127 lb 12.8 oz (58 kg)  11/02/15 129 lb 3.2 oz (58.6 kg)  10/27/15 129 lb 12.8 oz (58.9 kg)  10/19/15 129 lb 3.2 oz (58.6 kg)  10/14/15 129 lb 9.6 oz (58.8 kg)  10/01/15 129 lb (58.5 kg)  08/17/15 135 lb 8 oz (61.5 kg)   Patient weight has decreased by 3 lbs in the last 3 weeks. She has lost 10 lbs in 4 months.   Patient reports oral intake as good, but is suffering from symptoms including a slightly decreased appetite,  Lethargy/fatigue, diarrhea.  Pt reports eating 2 meals a day as well as snacks. Per dietary recall, her meals are ideal; they are high in kcals and protein. She makes a "pan of gravy" with biscuits and toast with PB for breakfast. She eats stirfry for dinner. She also has been drinking some Boost Breeze though is not wild about the taste. She had dried ensure, but this gave her diarrhea  Pt does report some diarrhea, fairly frequently. She is taking imodium to manage it. She says the severity of the diarrhea is not influenced by her meals. RD asked about her creon usage. She is taking it 2x a day. She says she doesn't take it midday as she only consumes a very small snack at this time and doesn't feel she needs it. She is taking the creon immediately before eating. RD asked her to take the creon 5 minutes sooner to give it a chance to dissolve and act. She does not this diarrhea affects her QOL and she needs to premedicate before going out. Potentially, her creon dosage may need adjusted.   Her friend/(family member?) next to her states the patients problem are "She doesn't have enough energy to do what she wants/once did" and "she doesn't eat as much at each meal".   Her meal quality is  good. She would benefit from larger or more frequent snacks as well as sipping on a supplement between meals. RD recommended Boost breeze. She likely would not need to take creon with this due to very low fat  and high simple sugars. RD gave sample of Ensure Clear.   RD/Intern gave education on how to manage her fatigue in regards to nutrition. She is advised to keep convenience foods on hand as well as foods that take minimal preporation. Pt says her fatigue has not influence her PO intake, rather she is just not able to do as much of the yardwork as she once did.   Despite her advanced malignancy, pt is very active. She is still able to cook, clean, shop, and bake. From what her companion described, pt is frustrated/sad that she is not able to do even more. It sounds that patient is having a hard time accepting her decline in functional status. Provided encouragement/support.  Left my contact info, coupons, samples, and handouts titled "Fatigue"  Pt's sub-optimally controlled Diarrhea discussed with MD.   Burtis Junes RD, LDN, [facility] Pager: [phone] 12/08/2015 10:44 AM

## 2015-12-08 NOTE — Patient Instructions (Signed)
[facility] Discharge Instructions for Patients Receiving Chemotherapy   Beginning January 23rd 2017 lab work for the [facility] will be done in the  Main lab at [facility] on 1st floor. If you have a lab appointment with the [facility] please come in thru the  Main Entrance and check in at the main information desk   Today you received the following chemotherapy agents Irinotecan,Leucovorin and 5FU. Follow-up as scheduled. Call clinic for any questions or concerns  To help prevent nausea and vomiting after your treatment, we encourage you to take your nausea medication   If you develop nausea and vomiting, or diarrhea that is not controlled by your medication, call the clinic.  The clinic phone number is [phone]. Office hours are Monday-Friday 8:30am-5:00pm.  BELOW ARE SYMPTOMS THAT SHOULD BE REPORTED IMMEDIATELY:  *FEVER GREATER THAN 101.0 F  *CHILLS WITH OR WITHOUT FEVER  NAUSEA AND VOMITING THAT IS NOT CONTROLLED WITH YOUR NAUSEA MEDICATION  *UNUSUAL SHORTNESS OF BREATH  *UNUSUAL BRUISING OR BLEEDING  TENDERNESS IN MOUTH AND THROAT WITH OR WITHOUT PRESENCE OF ULCERS  *URINARY PROBLEMS  *BOWEL PROBLEMS  UNUSUAL RASH Items with * indicate a potential emergency and should be followed up as soon as possible. If you have an emergency after office hours please contact your primary care physician or go to the nearest emergency department.  Please call the clinic during office hours if you have any questions or concerns.   You may also contact the Patient Navigator at [phone] should you have any questions or need assistance in obtaining follow up care.      Resources For Cancer Patients and their Caregivers ? American Cancer Society: Can assist with transportation, wigs, general needs, runs Look Good Feel Better.        ([phone] ? Cancer Care: Provides financial assistance, online support groups, medication/co-pay assistance.   1-800-813-HOPE (772)848-1813) ? [place] Assists [place] cancer patients and their families through emotional , educational and financial support.  331-147-7930 ? [place] DSS Where to apply for food stamps, Medicaid and utility assistance. 386-670-0866 ? RCATS: Transportation to medical appointments. 3120902910 ? Social Security Administration: May apply for disability if have a Stage IV cancer. 4192353019 910-028-8724 ? [employer], Disability and Transit Services: Assists with nutrition, care and transit needs. [phone]

## 2015-12-08 NOTE — Progress Notes (Signed)
[place]  Progress Note  Patient Care Team: Monico Blitz, MD as PCP - General  CHIEF COMPLAINTS/PURPOSE OF CONSULTATION:    Pancreatic cancer metastasized to lung [facility])   09/03/2008 Surgery    Whipple with Dr. Birdie Sons for T3N1 3/12 LN+ Pancreatic Adenocarcinoma      11/13/2008 - 09/12/2009 Chemotherapy    Adjuvant Gemcitabine X 6 months      08/31/2009 Surgery    Metastectomy with Dr. Arlyce Dice RUL 2 nodules, RLL (wedge resection X 2)      08/31/2009 Pathology Results    Metastatic adenocarcinoma TTF-1 negative      06/03/2010 Surgery    Left video-assisted thoracoscopic surgery, resection of left lower lobe lesion. with Dr. Arlyce Dice      06/04/2010 Pathology Results    adenocarcinoma positive for cytokeratin 7, focally positive for CDX2, with scattered staining for TTF1 cytokeratin 20. [facility] opinion felt to be lung primary although noted to stain for GI markers, not felt to be pancreatic primary. EGFR and ALK negative      03/31/2011 - 09/04/2011 Radiation Therapy    SBRT LUL 18Gy Dr. Tammi Klippel      03/15/2012 - 06/13/2012 Chemotherapy    5-FU based chemotherapy      02/13/2013 - 05/20/2013 Chemotherapy    Gemzar Abraxane for metastatic disease (given to cover both primaries at [facility])      07/15/2014 PET scan    Mild progression of pulm mets, hypermet mesenteric tumor assoc with  SMA is stable to slightly increased in degree of FDG uptake      07/23/2014 Imaging         07/30/2014 - 09/10/2014 Chemotherapy    Gemzar abraxane stopped due to poor tolerance      04/06/2015 Procedure    L 20 French chest tube for spontaneous pneumothorax, by Dr. Tharon Aquas Trigt      04/20/2015 Pathology Results    High grade carcinoma c/w patient's known pancreatic primary (secondary opinion at [facility]) PDL-1 high expression, preserved major and minor MMR, MSI stable, [facility] ONE testing performed,       04/20/2015 Surgery    Left VATS, mini  thoracotomy with stapling and oversewing of blebs, Talc pleurodesis. performed secondary to persistent air leak not improved with chest tube drainage      07/29/2015 Imaging    No signif change in appear of multifocal B cavitary pulmonary lesions, postop appearance upper abdomen, no findings of met disease to abd or pelvis,      09/29/2015 Imaging    Slight progression of multifocal cavitary pulmonary metastases, as discussed above. 2. Interval development of a lytic lesion in the antral lateral aspect of the left fifth rib where there is a nondisplaced pathologic fracture. No other definite osseous metastases are identified. 3. Prominent soft tissue adjacent to the proximal superior mesenteric artery and in the aortocaval nodal station, similar to prior examinations, likely to represent treated metastatic lymphadenopathy. 4. Epigastric ventral hernia containing several short loops of small bowel. There is a focally dilated loop of small bowel adjacent to this, however, this is an isolated finding. No other dilatation of small bowel or colon to suggest frank bowel obstruction at this time.       10/19/2015 -  Chemotherapy    The patient had palonosetron (ALOXI) injection 0.25 mg, 0.25 mg, Intravenous,  Once, 1 of 4 cycles  leucovorin 652 mg in dextrose 5 % 250 mL infusion, 400 mg/m2 = 652 mg, Intravenous,  Once, 1 of 4 cycles  fluorouracil (ADRUCIL) 2,950 mg in sodium chloride 0.9 % 91 mL chemo infusion, 1,800 mg/m2 = 2,950 mg (100 % of original dose 1,800 mg/m2), Intravenous, 1 Day/Dose, 1 of 4 cycles Dose modification: 1,920 mg/m2 (original dose 1,800 mg/m2, Cycle 1, Reason: Provider Judgment), 1,800 mg/m2 (original dose 1,800 mg/m2, Cycle 1, Reason: Provider Judgment)  irinotecan LIPOSOME (ONIVYDE) 55.9 mg in sodium chloride 0.9 % 500 mL chemo infusion, 35 mg/m2 = 55.9 mg (100 % of original dose 35 mg/m2), Intravenous, Once, 1 of 4 cycles Dose modification: 35 mg/m2 (original dose 35  mg/m2, Cycle 1, Reason: Provider Judgment)  for chemotherapy treatment.         HISTORY OF PRESENTING ILLNESS:  [name] [name] 75 y.o. female is here because of a history of stage IV pancreatic carcinoma, she is also reported to have a history of stage IV BAC of the lungs. (On further review I feel she may have had stage I BAC of the lungs).  Patient is accompanied by her sister today. She continues on liposomal irinotecan/leucovorin.   Wyndi still experiences fatigue, but it has not worsened. Patient says she takes time for herself and rests whenever she is tired. Fatigue began after collapsed lung earlier this year.   Daphnee has had a cough since last Wednesday. She has been drinking honey and vinegar and feels it has started to improve. She denies fever or chills. No new pain. No change in appetite.   She denies abdominal pain today.   Patient does not need any refills.    She has already received flu shot for this year.  MEDICAL HISTORY:  Past Medical History:  Diagnosis Date  . Abdominal wall hernia   . Aortic aneurysm (HCC)    small aortic aneurysm  . Cancer [facility]) July 2010   Pancreatic adenocarcinoma  . Cataract 2010   b/l surgery  . Cervical cancer ([place]) 1963  . COPD (chronic obstructive pulmonary disease) ([place])   . Emphysema   . Hypercholesterolemia   . Hypertension   . Pancreatic cancer metastasized to lung [facility]) 09/04/2015    SURGICAL HISTORY: Past Surgical History:  Procedure Laterality Date  . BREAST SURGERY  1988   Right breast abcess  . BUNIONECTOMY  2007  . CHOLECYSTECTOMY  July 2010   done w/ Whipple procedure  . EYE SURGERY  2009   Bilateral cataract  . STAPLING OF BLEBS Left 04/20/2015   Procedure: STAPLING OF BLEBS;  Surgeon: Ivin Poot, MD;  Location: [facility];  Service: Thoracic;  Laterality: Left;  . TONSILLECTOMY  1943  . VAGINAL HYSTERECTOMY  1963   Cervical Cancer  . VIDEO ASSISTED THORACOSCOPY Left 04/20/2015   Procedure: VIDEO  ASSISTED THORACOSCOPY;  Surgeon: Ivin Poot, MD;  Location: [facility];  Service: Thoracic;  Laterality: Left;  . WHIPPLE PROCEDURE  July 2010   pancreatic adenocarcinoma    SOCIAL HISTORY: Social History   Social History  . Marital status: Widowed    Spouse name: N/A  . Number of children: 0  . Years of education: N/A   Occupational History  .  Retired   Social History Main Topics  . Smoking status: Former Smoker    Packs/day: 1.00    Years: 30.00    Types: Cigarettes    Quit date: 02/29/1996  . Smokeless tobacco: Never Used  . Alcohol use No  . Drug use: No  . Sexual activity: No   Other Topics Concern  .  Not on file   Social History Narrative  . No narrative on file   Widowed; 5 years ago in December. Married 32 years, no children. Had cervical cancer when she was 21 Pancreatic cancer in July of 2010. Worked at [employer] for 44 years; also worked i Solicitor, at [employer], Social research officer, government. Smoked, but quit 15-20 years ago.  Enjoys quilting, crocheting, knitting. Has nieces and nephews.  Was born in [place].  FAMILY HISTORY: Family History  Problem Relation Age of Onset  . Pancreatic cancer Brother   . Colon cancer Sister 72    12/2006 dx surgery and chemotherapy  . Ovarian cancer Sister 91    Ovarian ca,  surgery and chemotherapy    One older sister and one younger sister still living. Four brothers deceased. Daddy died of emphysema back in 05-13-73 at the age of 52-68 Mother was 66 when she died in 05/14/87. Had 4 brothers, died from 05/14/98 to May 14, 2006 Heart trouble, emphysema & dialysis ("he just had everything"). One brother had lung disease. One brother had pancreatic cancer and lived less than 2 months. [name] has had ovarian and colon cancer. Nephew that died last year with brain cancer. Niece with cancer being treated here.   ALLERGIES:  is allergic to oxaliplatin and aspirin.  MEDICATIONS:  Current Outpatient Prescriptions  Medication Sig Dispense Refill  .  lipase/protease/amylase (CREON) 36000 UNITS CPEP capsule Take by mouth.    . diphenoxylate-atropine (LOMOTIL) 2.5-0.025 MG tablet Take 1 tablet by mouth 4 (four) times daily as needed for diarrhea or loose stools. 30 tablet 0  . fluorouracil CALGB 97673 in sodium chloride 0.9 % 150 mL Inject into the vein. To be given every 14 days over 46 hours.    . hydrochlorothiazide (HYDRODIURIL) 25 MG tablet Take by mouth.    Marland Kitchen ibuprofen (ADVIL,MOTRIN) 200 MG tablet Take 400 mg by mouth every 6 (six) hours as needed.    . IRINOTECAN HCL IV Inject into the vein. To be given every 14 days.    Marland Kitchen leucovorin in dextrose 5 % 250 mL Inject into the vein once. To be given every 14 days    . lidocaine-prilocaine (EMLA) cream Apply a quarter size amount to port site 1 hour prior to chemo. Do not rub in. Cover with plastic wrap. 30 g 3  . lipase/protease/amylase (CREON) 36000 UNITS CPEP capsule Take 1 capsule (36,000 Units total) by mouth 3 (three) times daily with meals. 270 capsule 1  . loperamide (IMODIUM A-D) 2 MG tablet At the onset of diarrhea, take 2 tabs. Then take 1 tab every 2 hours until you have gone 12 hours without having a loose stool. If it is bedtime and you have a loose stool(s) take 2 tabs every 4 hours until morning. Call [place]. 60 tablet 1  . loperamide (IMODIUM) 2 MG capsule     . losartan (COZAAR) 100 MG tablet     . metoprolol succinate (TOPROL-XL) 50 MG 24 hr tablet Take 25 mg by mouth daily before breakfast. Take with or immediately following a meal.    . nystatin cream (MYCOSTATIN)     . omeprazole (PRILOSEC) 20 MG capsule Take 20 mg by mouth daily.     . ondansetron (ZOFRAN) 8 MG tablet Take 1 tablet (8 mg total) by mouth every 8 (eight) hours as needed for nausea or vomiting. 30 tablet 2  . Potassium Bicarb-Citric Acid (EFFER-K) 10 MEQ TBEF Take 1 tablet (10 mEq total) by mouth 2 (two) times daily. [place]  each 0  . potassium chloride SA (K-DUR,KLOR-CON) 20 MEQ tablet Take by mouth.    .  prochlorperazine (COMPAZINE) 10 MG tablet Take 1 tablet (10 mg total) by mouth every 6 (six) hours as needed for nausea or vomiting. 30 tablet 2  . XARELTO 20 MG TABS tablet      No current facility-administered medications for this visit.    Facility-Administered Medications Ordered in Other Visits  Medication Dose Route Frequency Provider Last Rate Last Dose  . fluorouracil (ADRUCIL) 2,450 mg in sodium chloride 0.9 % 101 mL chemo infusion  1,500 mg/m2 (Treatment Plan Recorded) Intravenous 1 day or 1 dose Patrici Ranks, MD   2,450 mg at 12/08/15 1413  . heparin lock flush 100 unit/mL  500 Units Intracatheter Once PRN Patrici Ranks, MD      . sodium chloride flush (NS) 0.9 % injection 10 mL  10 mL Intracatheter PRN Patrici Ranks, MD   10 mL at 12/08/15 1610    Review of Systems  Constitutional: Positive for malaise/fatigue.  HENT: Negative.   Eyes: Negative.   Respiratory: Positive for cough.   Cardiovascular: Negative.   Gastrointestinal: Positive for diarrhea and nausea. Negative for abdominal pain, constipation and vomiting.       Nausea managed with medication. Diarrhea managed with imodium  Genitourinary: Negative.   Skin: Negative.   Neurological: Negative.   Endo/Heme/Allergies: Negative.   Psychiatric/Behavioral: Negative.   All other systems reviewed and are negative. 14 point ROS was done and is otherwise as detailed above or in HPI  PHYSICAL EXAMINATION: ECOG PERFORMANCE STATUS: 1 - Symptomatic but completely ambulatory  Vitals:   12/08/15 1434  BP: 110/62  Pulse: (!) 59  Resp: 18  Temp: 98.1 F (36.7 C)   There were no vitals filed for this visit.  Physical Exam  Constitutional: She is oriented to person, place, and time and well-developed, well-nourished, and in no distress. No distress.  Well groomed  HENT:  Head: Normocephalic and atraumatic.  Mouth/Throat: Oropharynx is clear and moist. No oropharyngeal exudate.  Eyes: Conjunctivae and EOM  are normal. Pupils are equal, round, and reactive to light. Right eye exhibits no discharge. Left eye exhibits no discharge. No scleral icterus.  Neck: Normal range of motion. Neck supple. No tracheal deviation present. No thyromegaly present.  Cardiovascular: Normal rate, regular rhythm and intact distal pulses.   No murmur heard. Pulmonary/Chest: Effort normal and breath sounds normal.  Abdominal: Soft. Bowel sounds are normal. She exhibits no distension and no mass. There is no tenderness. There is no rebound and no guarding.  Abdominal hernia  Musculoskeletal: Normal range of motion. She exhibits no edema or tenderness.  Lymphadenopathy:    She has no cervical adenopathy.  Neurological: She is alert and oriented to person, place, and time. Gait normal. Coordination normal.  Skin: Skin is warm and dry. She is not diaphoretic.  Psychiatric: Mood, memory, affect and judgment normal.  Nursing note and vitals reviewed.   LABORATORY DATA:  I have reviewed the data as listedResults for YARDEN, MANUELITO (MRN [id number]) as of 12/08/2015 10:28  Ref. Range 12/08/2015 09:32  Sodium Latest Ref Range: 135 - 145 mmol/L 138  Potassium Latest Ref Range: 3.5 - 5.1 mmol/L 3.3 (L)  Chloride Latest Ref Range: 101 - 111 mmol/L 111  CO2 Latest Ref Range: 22 - 32 mmol/L 19 (L)  BUN Latest Ref Range: 6 - 20 mg/dL 20  Creatinine Latest Ref Range: 0.44 - 1.00 mg/dL 1.33 (  H)  Calcium Latest Ref Range: 8.9 - 10.3 mg/dL 8.7 (L)  EGFR (Non-African Amer.) Latest Ref Range: >60 mL/min 37 (L)  EGFR (African American) Latest Ref Range: >60 mL/min 43 (L)  Glucose Latest Ref Range: 65 - 99 mg/dL 147 (H)  Anion gap Latest Ref Range: 5 - 15  8  Alkaline Phosphatase Latest Ref Range: 38 - 126 U/L 114  Albumin Latest Ref Range: 3.5 - 5.0 g/dL 3.4 (L)  AST Latest Ref Range: 15 - 41 U/L 23  ALT Latest Ref Range: 14 - 54 U/L 13 (L)  Total Protein Latest Ref Range: 6.5 - 8.1 g/dL 6.4 (L)  Total Bilirubin Latest Ref  Range: 0.3 - 1.2 mg/dL 0.7  WBC Latest Ref Range: 4.0 - 10.5 K/uL 5.5  RBC Latest Ref Range: 3.87 - 5.11 MIL/uL 3.06 (L)  Hemoglobin Latest Ref Range: 12.0 - 15.0 g/dL 9.3 (L)  HCT Latest Ref Range: 36.0 - 46.0 % 28.6 (L)  MCV Latest Ref Range: 78.0 - 100.0 fL 93.5  MCH Latest Ref Range: 26.0 - 34.0 pg 30.4  MCHC Latest Ref Range: 30.0 - 36.0 g/dL 32.5  RDW Latest Ref Range: 11.5 - 15.5 % 15.6 (H)  Platelets Latest Ref Range: 150 - 400 K/uL 144 (L)  Neutrophils Latest Units: % 66  Lymphocytes Latest Units: % 20  Monocytes Relative Latest Units: % 13  Eosinophil Latest Units: % 1  Basophil Latest Units: % 0  NEUT# Latest Ref Range: 1.7 - 7.7 K/uL 3.6  Lymphocyte # Latest Ref Range: 0.7 - 4.0 K/uL 1.1  Monocyte # Latest Ref Range: 0.1 - 1.0 K/uL 0.7  Eosinophils Absolute Latest Ref Range: 0.0 - 0.7 K/uL 0.1  Basophils Absolute Latest Ref Range: 0.0 - 0.1 K/uL 0.0    Results for TANITH, DAGOSTINO (MRN [id number])   Ref. Range 10/01/2015 15:41 10/14/2015 16:00 11/16/2015 08:41 12/08/2015 09:32 12/22/2015 08:47  CA 19-9 Latest Ref Range: 0 - 35 U/mL 940 (H) 1,134 (H) 625 (H) 435 (H) 374 (H)   PATHOLOGY     2012                  RADIOGRAPHIC STUDIES: I have personally reviewed the radiological images as listed and agreed with the findings in the report. No results found. Study Result   CLINICAL DATA:  Subsequent evaluation of a 75 year old female with history of stage IV pancreatic cancer with metastatic disease to both lungs. Originally diagnosed in 2010 status post surgical resection, chemotherapy and radiation therapy. No current complaints.  EXAM: CT CHEST, ABDOMEN, AND PELVIS WITH CONTRAST  TECHNIQUE: Multidetector CT imaging of the chest, abdomen and pelvis was performed following the standard protocol during bolus administration of intravenous contrast.  CONTRAST:  174m ISOVUE-300 IOPAMIDOL (ISOVUE-300) INJECTION 61%  COMPARISON:  Multiple  priors, most recently CT of the chest, abdomen and pelvis 07/29/2015.  FINDINGS: CT CHEST FINDINGS  Cardiovascular: Heart size is borderline enlarged. There is no significant pericardial fluid, thickening or pericardial calcification. There is aortic atherosclerosis, as well as atherosclerosis of the great vessels of the mediastinum and the coronary arteries, including calcified atherosclerotic plaque in the left main, left anterior descending, left circumflex and right coronary arteries. Faint calcifications of the aortic valve. Right internal jugular single-lumen porta cath with tip terminating in the distal superior vena cava.  Mediastinum/Nodes: No pathologically enlarged mediastinal or hilar lymph nodes. 1.8 cm low-attenuation nodule in the right lobe of the thyroid gland is similar to prior studies, and nonspecific,  but likely benign. Esophagus is normal in appearance. No axillary lymphadenopathy.  Lungs/Pleura: Again noted are multifocal highly irregular areas of nodular and mass-like appearing architectural distortion in the lungs bilaterally. These are most evident throughout the mid to lower lungs, and presumably reflect widespread metastatic disease. The largest of these lesions is a pleural-based cavitary mass in the posterior aspect of the right lower lobe (image 113 of series 6) which has enlarged in size and currently measures 6.9 x 4.0 cm. The other smaller lesions are are generally stable to slightly increased in size compared to the prior study, although direct comparison between examinations is challenging due to the highly irregular shape of these lesions. No new pulmonary lesions are noted. Postoperative changes of wedge resection are noted in the right upper lobe. Mild diffuse bronchial wall thickening with severe centrilobular and paraseptal emphysema. No acute consolidative airspace disease. No pleural effusions.  Musculoskeletal: Lytic lesion in the  anterolateral aspect of the left fifth rib where there is a pathologic fracture (image 77 of series 6). Multiple other old and healing left-sided rib fractures are noted.  CT ABDOMEN PELVIS FINDINGS  Hepatobiliary: Pneumobilia, presumably related to prior sphincterotomy. No suspicious cystic or solid hepatic lesions. Status post cholecystectomy.  Pancreas: Postoperative changes of prior Whipple procedure are again noted. Gas is noted in the main pancreatic duct.  Spleen: Unremarkable.  Adrenals/Urinary Tract: 1.8 x 1.5 cm right adrenal nodule is similar to prior examinations, and was not hypermetabolic on prior PET-CT 82/95/6213, presumably a benign lesion such as an adenoma. Mild adrenal thickening of the left adrenal gland is unchanged. Right kidney is normal in appearance. 1.7 cm simple cyst in the lower pole of the left kidney. No hydroureteronephrosis. Urinary bladder is normal in appearance.  Stomach/Bowel: Postoperative changes related to prior Whipple procedure are again noted. Ventral hernia containing short segments of small bowel in the epigastric region. There is some mild dilatation of a loop of small bowel adjacent to the ventral hernia in the epigastric region (image 61 of series 2) which measures up to 4.4 cm in diameter. However, there is no other pathologic dilatation of small bowel or colon to suggest frank bowel obstruction at this time. Normal appendix.  Vascular/Lymphatic: Fusiform aneurysmal dilatation of the infrarenal abdominal aorta which measures up to 3.9 x 3.5 cm (image 68 of series 2), similar to prior examinations. Prominent soft tissue adjacent to the proximal superior mesenteric artery and in the aortocaval region (axial images 60-63 of series 2) measuring up to 2.5 x 1.8 cm, likely to represent treated lymphadenopathy. No other lymphadenopathy noted elsewhere in the abdomen or pelvis.  Reproductive: Status post hysterectomy. Ovaries  are not confidently identified may be surgically absent or atrophic.  Other: No significant volume of ascites.  No pneumoperitoneum.  Musculoskeletal: There are no aggressive appearing lytic or blastic lesions noted in the visualized portions of the skeleton.  IMPRESSION: 1. Slight progression of multifocal cavitary pulmonary metastases, as discussed above. 2. Interval development of a lytic lesion in the antral lateral aspect of the left fifth rib where there is a nondisplaced pathologic fracture. No other definite osseous metastases are identified. 3. Prominent soft tissue adjacent to the proximal superior mesenteric artery and in the aortocaval nodal station, similar to prior examinations, likely to represent treated metastatic lymphadenopathy. 4. Epigastric ventral hernia containing several short loops of small bowel. There is a focally dilated loop of small bowel adjacent to this, however, this is an isolated finding. No other dilatation of  small bowel or colon to suggest frank bowel obstruction at this time. 5. Aortic atherosclerosis, in addition to left main and 3 vessel coronary artery disease. In addition, there is a 3.9 x 3.5 cm fusiform infrarenal abdominal aortic aneurysm which is similar to prior studies. Recommend followup by ultrasound in 2 years. This recommendation follows ACR consensus guidelines: White Paper of the ACR Incidental Findings Committee II on Vascular Findings. J Am Coll Radiol 2013; 10:789-794. 6. Additional incidental findings, as above.   Electronically Signed   By: Vinnie Langton M.D.   On: 09/29/2015 16:11    ASSESSMENT & PLAN:  Stage IV Pancreatic Adenocarcinoma Lung Adenocarcinoma RIJ thrombosis Transverse Sinus Thrombosis Chronic Anticoagulation with lovenox L pneumothorax, spontaneous Malignant Pleural Effusion, Talc Pleurodesis Preserved expression of major/minor MMR proteins done on 2017 LUL biopsy by Dr. Prescott Gum Dyspnea Anemia Cancer related fatigue Hypokalemia Cough  Complicated course but she has certainly done well for many years. She and her sister are not unrealistic. She has a HCPOA and living will in place. We discussed that although her pancreatic cancer is PDL1 positive this unfortunately does not translate into benefit from immunotherapy, MSI - high status does and unfortunately, her disease is MSI - stable. .  Patient appears well. Her potassium is low today. We discussed options to help elevate her potassium levels.   Mauri has had a cough since last Wednesday. She says it has begun to improve. I encouraged her to notify me if it has not cleared up by Thursday or Friday.  Patient will return on 12/22/15 for cycle 5 of treatment. I will follow up with her at that time.   Orders Placed This Encounter  Procedures  . Cancer antigen 19-9    Standing Status:   Future    Number of Occurrences:   1    Standing Expiration Date:   12/07/2016    All questions were answered. The patient knows to call the clinic with any problems, questions or concerns.  This document serves as a record of services personally performed by Ancil Linsey, MD. It was created on her behalf by Elmyra Ricks, a trained medical scribe. The creation of this record is based on the scribe's personal observations and the provider's statements to them. This document has been checked and approved by the attending provider.  I have reviewed the above documentation for accuracy and completeness and I agree with the above.  This note was electronically signed.    Molli Hazard, MD  12/08/2015 4:21 PM

## 2015-12-08 NOTE — Progress Notes (Signed)
Yaret M Abraha tolerated chemo tx well without complaints or incident. Labs reviewed with Dr. Whitney Muse as well as pt's complaints of "head cold" with runny nose and occasional productive cough with small amount of pale yellow sputum. Pt denied any fever and reports she feels OK for treatment today. Effer K e-scribed per MD orders and pt approved for chemo tx.Pt discharged with 5FU pump infusing without issues. VSS upon discharge. Pt discharged self ambulatory in satisfactory condition with sister

## 2015-12-09 LAB — CANCER ANTIGEN 19-9: CA 19 9: 435 U/mL — AB (ref 0–35)

## 2015-12-10 ENCOUNTER — Encounter (HOSPITAL_BASED_OUTPATIENT_CLINIC_OR_DEPARTMENT_OTHER): Payer: Medicare Other

## 2015-12-10 VITALS — BP 115/65 | HR 60 | Temp 98.0°F | Resp 18

## 2015-12-10 DIAGNOSIS — C7802 Secondary malignant neoplasm of left lung: Secondary | ICD-10-CM | POA: Diagnosis not present

## 2015-12-10 DIAGNOSIS — Z452 Encounter for adjustment and management of vascular access device: Secondary | ICD-10-CM | POA: Diagnosis not present

## 2015-12-10 DIAGNOSIS — C7800 Secondary malignant neoplasm of unspecified lung: Principal | ICD-10-CM

## 2015-12-10 DIAGNOSIS — C259 Malignant neoplasm of pancreas, unspecified: Secondary | ICD-10-CM | POA: Diagnosis not present

## 2015-12-10 MED ORDER — AZITHROMYCIN 250 MG PO TABS: ORAL_TABLET | ORAL | 0 refills | Status: DC

## 2015-12-10 MED ORDER — HEPARIN SOD (PORK) LOCK FLUSH 100 UNIT/ML IV SOLN
500.0000 [IU] | Freq: Once | INTRAVENOUS | Status: AC | PRN
  Administered 2015-12-10: 500 [IU]

## 2015-12-10 MED ORDER — SODIUM CHLORIDE 0.9% FLUSH
10.0000 mL | INTRAVENOUS | Status: DC | PRN
  Administered 2015-12-10: 10 mL
  Filled 2015-12-10: qty 10

## 2015-12-14 ENCOUNTER — Ambulatory Visit (HOSPITAL_COMMUNITY): Payer: Medicare Other

## 2015-12-15 ENCOUNTER — Encounter (HOSPITAL_COMMUNITY): Payer: Self-pay

## 2015-12-15 NOTE — Patient Instructions (Signed)
[place] at [facility] Discharge Instructions  RECOMMENDATIONS MADE BY THE CONSULTANT AND ANY TEST RESULTS WILL BE SENT TO YOUR REFERRING PHYSICIAN.  Home infusion pump disconnected and port flush/deaccess. Return as scheduled for chemotherapy and office visit.  Thank you for choosing [place] at [facility] to provide your oncology and hematology care.  To afford each patient quality time with our provider, please arrive at least 15 minutes before your scheduled appointment time.   Beginning January 23rd 2017 lab work for the [facility] will be done in the  Main lab at [facility] on 1st floor. If you have a lab appointment with the [facility] please come in thru the  Main Entrance and check in at the main information desk  You need to re-schedule your appointment should you arrive 10 or more minutes late.  We strive to give you quality time with our providers, and arriving late affects you and other patients whose appointments are after yours.  Also, if you no show three or more times for appointments you may be dismissed from the clinic at the providers discretion.     Again, thank you for choosing [facility].  Our hope is that these requests will decrease the amount of time that you wait before being seen by our physicians.       _____________________________________________________________  Should you have questions after your visit to [facility], please contact our office at [phone] between the hours of 8:30 a.m. and 4:30 p.m.  Voicemails left after 4:30 p.m. will not be returned until the following business day.  For prescription refill requests, have your pharmacy contact our office.         Resources For Cancer Patients and their Caregivers ? American Cancer Society: Can assist with transportation, wigs, general needs, runs Look Good Feel Better.        2695196833 ? Cancer  Care: Provides financial assistance, online support groups, medication/co-pay assistance.  1-800-813-HOPE (603) 845-8080) ? [place] Assists [place] cancer patients and their families through emotional , educational and financial support.  807-364-3037 ? [place] DSS Where to apply for food stamps, Medicaid and utility assistance. 504-177-2541 ? RCATS: Transportation to medical appointments. (318)388-9420 ? Social Security Administration: May apply for disability if have a Stage IV cancer. 4087376121 952-673-0067 ? [employer], Disability and Transit Services: Assists with nutrition, care and transit needs. [place] Support Programs: '@10RELATIVEDAYS'$ @ > Cancer Support Group  2nd Tuesday of the month 1pm-2pm, Journey Room  > Creative Journey  3rd Tuesday of the month 1130am-1pm, Journey Room  > Look Good Feel Better  1st Wednesday of the month 10am-12 noon, Journey Room (Call [place] to register [phone])

## 2015-12-15 NOTE — Progress Notes (Signed)
[name] [name] presents to have home infusion pump d/c'd and for port-a-cath deaccess with flush.  Portacath located right chest wall accessed with  H 20 needle.  Good blood return present. Portacath flushed with NS and 500U/55m Heparin, and needle removed intact.  Procedure tolerated well and without incident.

## 2015-12-16 ENCOUNTER — Encounter (HOSPITAL_COMMUNITY): Payer: Medicare Other

## 2015-12-20 NOTE — Progress Notes (Signed)
Patient on plan of care prior to pathways. 

## 2015-12-22 ENCOUNTER — Encounter (HOSPITAL_COMMUNITY): Payer: Medicare Other | Attending: Hematology & Oncology | Admitting: Hematology & Oncology

## 2015-12-22 ENCOUNTER — Encounter (HOSPITAL_COMMUNITY): Payer: Self-pay | Admitting: Hematology & Oncology

## 2015-12-22 ENCOUNTER — Ambulatory Visit (HOSPITAL_COMMUNITY)
Admission: RE | Admit: 2015-12-22 | Discharge: 2015-12-22 | Disposition: A | Discharge status: Home / Self Care | Payer: Medicare Other | Source: Ambulatory Visit | Attending: Hematology & Oncology | Admitting: Hematology & Oncology

## 2015-12-22 ENCOUNTER — Encounter (HOSPITAL_BASED_OUTPATIENT_CLINIC_OR_DEPARTMENT_OTHER): Payer: Medicare Other

## 2015-12-22 VITALS — BP 114/51 | HR 57 | Temp 98.0°F | Resp 18

## 2015-12-22 VITALS — BP 150/57 | HR 70 | Temp 97.6°F | Resp 18 | Wt 126.8 lb

## 2015-12-22 DIAGNOSIS — R197 Diarrhea, unspecified: Secondary | ICD-10-CM | POA: Diagnosis not present

## 2015-12-22 DIAGNOSIS — C7800 Secondary malignant neoplasm of unspecified lung: Secondary | ICD-10-CM

## 2015-12-22 DIAGNOSIS — R05 Cough: Secondary | ICD-10-CM

## 2015-12-22 DIAGNOSIS — Z5111 Encounter for antineoplastic chemotherapy: Secondary | ICD-10-CM

## 2015-12-22 DIAGNOSIS — C7802 Secondary malignant neoplasm of left lung: Secondary | ICD-10-CM

## 2015-12-22 DIAGNOSIS — T451X5A Adverse effect of antineoplastic and immunosuppressive drugs, initial encounter: Secondary | ICD-10-CM

## 2015-12-22 DIAGNOSIS — C259 Malignant neoplasm of pancreas, unspecified: Secondary | ICD-10-CM | POA: Diagnosis not present

## 2015-12-22 DIAGNOSIS — J449 Chronic obstructive pulmonary disease, unspecified: Secondary | ICD-10-CM | POA: Diagnosis not present

## 2015-12-22 DIAGNOSIS — R058 Other specified cough: Secondary | ICD-10-CM

## 2015-12-22 DIAGNOSIS — R059 Cough, unspecified: Secondary | ICD-10-CM

## 2015-12-22 DIAGNOSIS — D649 Anemia, unspecified: Secondary | ICD-10-CM

## 2015-12-22 DIAGNOSIS — D6481 Anemia due to antineoplastic chemotherapy: Secondary | ICD-10-CM

## 2015-12-22 DIAGNOSIS — Z95828 Presence of other vascular implants and grafts: Secondary | ICD-10-CM | POA: Diagnosis not present

## 2015-12-22 LAB — CBC WITH DIFFERENTIAL/PLATELET
Basophils Absolute: 0 10*3/uL (ref 0.0–0.1)
Basophils Relative: 0 %
EOS PCT: 1 %
Eosinophils Absolute: 0.1 10*3/uL (ref 0.0–0.7)
HCT: 30.1 % — ABNORMAL LOW (ref 36.0–46.0)
Hemoglobin: 9.5 g/dL — ABNORMAL LOW (ref 12.0–15.0)
LYMPHS ABS: 1.1 10*3/uL (ref 0.7–4.0)
LYMPHS PCT: 17 %
MCH: 30 pg (ref 26.0–34.0)
MCHC: 31.6 g/dL (ref 30.0–36.0)
MCV: 95 fL (ref 78.0–100.0)
MONO ABS: 0.6 10*3/uL (ref 0.1–1.0)
Monocytes Relative: 10 %
Neutro Abs: 4.4 10*3/uL (ref 1.7–7.7)
Neutrophils Relative %: 72 %
PLATELETS: 147 10*3/uL — AB (ref 150–400)
RBC: 3.17 MIL/uL — ABNORMAL LOW (ref 3.87–5.11)
RDW: 15.5 % (ref 11.5–15.5)
WBC: 6.1 10*3/uL (ref 4.0–10.5)

## 2015-12-22 LAB — COMPREHENSIVE METABOLIC PANEL
ALT: 11 U/L — ABNORMAL LOW (ref 14–54)
AST: 18 U/L (ref 15–41)
Albumin: 3.6 g/dL (ref 3.5–5.0)
Alkaline Phosphatase: 96 U/L (ref 38–126)
Anion gap: 7 (ref 5–15)
BILIRUBIN TOTAL: 0.7 mg/dL (ref 0.3–1.2)
BUN: 23 mg/dL — AB (ref 6–20)
CO2: 23 mmol/L (ref 22–32)
CREATININE: 1.18 mg/dL — AB (ref 0.44–1.00)
Calcium: 9.2 mg/dL (ref 8.9–10.3)
Chloride: 108 mmol/L (ref 101–111)
GFR calc Af Amer: 50 mL/min — ABNORMAL LOW (ref >60)
GFR, EST NON AFRICAN AMERICAN: 43 mL/min — AB (ref >60)
Glucose, Bld: 87 mg/dL (ref 65–99)
POTASSIUM: 4.6 mmol/L (ref 3.5–5.1)
Sodium: 138 mmol/L (ref 135–145)
TOTAL PROTEIN: 6.6 g/dL (ref 6.5–8.1)

## 2015-12-22 MED ORDER — SODIUM CHLORIDE 0.9 % IV SOLN
Freq: Once | INTRAVENOUS | Status: AC
  Administered 2015-12-22: 11:00:00 via INTRAVENOUS

## 2015-12-22 MED ORDER — SODIUM CHLORIDE 0.9 % IV SOLN
1500.0000 mg/m2 | INTRAVENOUS | Status: DC
  Administered 2015-12-22: 2450 mg via INTRAVENOUS
  Filled 2015-12-22: qty 49

## 2015-12-22 MED ORDER — PALONOSETRON HCL INJECTION 0.25 MG/5ML
0.2500 mg | Freq: Once | INTRAVENOUS | Status: AC
  Administered 2015-12-22: 0.25 mg via INTRAVENOUS
  Filled 2015-12-22: qty 5

## 2015-12-22 MED ORDER — SODIUM CHLORIDE 0.9% FLUSH: 10.0000 mL | INTRAVENOUS | Status: DC | PRN

## 2015-12-22 MED ORDER — SODIUM CHLORIDE 0.9 % IV SOLN
35.0000 mg/m2 | Freq: Once | INTRAVENOUS | Status: AC
  Administered 2015-12-22: 55.9 mg via INTRAVENOUS
  Filled 2015-12-22: qty 3

## 2015-12-22 MED ORDER — LEVOFLOXACIN 250 MG PO TABS: ORAL_TABLET | ORAL | 0 refills | Status: DC

## 2015-12-22 MED ORDER — PROCHLORPERAZINE MALEATE 10 MG PO TABS
10.0000 mg | ORAL_TABLET | Freq: Once | ORAL | Status: AC
  Administered 2015-12-22: 10 mg via ORAL
  Filled 2015-12-22: qty 1

## 2015-12-22 MED ORDER — LEUCOVORIN CALCIUM INJECTION 350 MG
400.0000 mg/m2 | Freq: Once | INTRAVENOUS | Status: AC
  Administered 2015-12-22: 652 mg via INTRAVENOUS
  Filled 2015-12-22: qty 32.6

## 2015-12-22 NOTE — Assessment & Plan Note (Signed)
Stage IV pancreatic carcinoma, she is also reported to have a history of stage IV BAC of the lungs. (On further review I feel she may have had stage I BAC of the lungs).  She is currently on  liposomal irinotecan/5-FU (dose reduced).  Oncology history is up to date.  Labs today: CBC diff, CMET.  I personally reviewed and went over laboratory results with the patient.  The results are noted within this dictation.  We reviewed her CA 19-9 marker which has declined with treatment.  She is due for her next treatment on 11/30/2015.  Given her slow recovery from her last treatment, she may consider delaying treatment x 1 week.  We had a good conversation and this is what I recommended.  I think a 1 week delay is very reasonable.  We discussed goals of care and she was very frank that quality of life is most important to her.  She is educated that we must always weight the risks and benefits of ongoing treatment and therefore, delays or stopping treatment are reasonable and will most likely be needed.  Weight loss is noted and [name] [name], RD is notified.  Return in ~ 2 weeks for follow-up with labs.

## 2015-12-22 NOTE — Patient Instructions (Addendum)
[place] at [facility] Discharge Instructions  RECOMMENDATIONS MADE BY THE CONSULTANT AND ANY TEST RESULTS WILL BE SENT TO YOUR REFERRING PHYSICIAN.  Follow up in 2 weeks with labs. Chemo and MD appt.  Thank you for choosing [place] at [facility] to provide your oncology and hematology care.  To afford each patient quality time with our provider, please arrive at least 15 minutes before your scheduled appointment time.   Beginning January 23rd 2017 lab work for the [facility] will be done in the  Main lab at [facility] on 1st floor. If you have a lab appointment with the [facility] please come in thru the  Main Entrance and check in at the main information desk  You need to re-schedule your appointment should you arrive 10 or more minutes late.  We strive to give you quality time with our providers, and arriving late affects you and other patients whose appointments are after yours.  Also, if you no show three or more times for appointments you may be dismissed from the clinic at the providers discretion.     Again, thank you for choosing [facility].  Our hope is that these requests will decrease the amount of time that you wait before being seen by our physicians.       _____________________________________________________________  Should you have questions after your visit to [facility], please contact our office at [phone] between the hours of 8:30 a.m. and 4:30 p.m.  Voicemails left after 4:30 p.m. will not be returned until the following business day.  For prescription refill requests, have your pharmacy contact our office.         Resources For Cancer Patients and their Caregivers ? American Cancer Society: Can assist with transportation, wigs, general needs, runs Look Good Feel Better.        847-362-7575 ? Cancer Care: Provides financial assistance, online support groups,  medication/co-pay assistance.  1-800-813-HOPE (734) 049-9669) ? [place] Assists [place] cancer patients and their families through emotional , educational and financial support.  5750061792 ? [place] DSS Where to apply for food stamps, Medicaid and utility assistance. (206) 511-4131 ? RCATS: Transportation to medical appointments. (951)261-6841 ? Social Security Administration: May apply for disability if have a Stage IV cancer. 479-050-8263 425-132-3120 ? [employer], Disability and Transit Services: Assists with nutrition, care and transit needs. [place] Support Programs: '@10RELATIVEDAYS'$ @ > Cancer Support Group  2nd Tuesday of the month 1pm-2pm, Journey Room  > Creative Journey  3rd Tuesday of the month 1130am-1pm, Journey Room  > Look Good Feel Better  1st Wednesday of the month 10am-12 noon, Journey Room (Call [place] to register [phone])

## 2015-12-22 NOTE — Progress Notes (Signed)
[place]  Progress Note  Patient Care Team: Monico Blitz, MD as PCP - General  CHIEF COMPLAINTS/PURPOSE OF CONSULTATION:    Pancreatic cancer metastasized to lung [facility])   09/03/2008 Surgery    Whipple with Dr. Birdie Sons for T3N1 3/12 LN+ Pancreatic Adenocarcinoma      11/13/2008 - 09/12/2009 Chemotherapy    Adjuvant Gemcitabine X 6 months      08/31/2009 Surgery    Metastectomy with Dr. Arlyce Dice RUL 2 nodules, RLL (wedge resection X 2)      08/31/2009 Pathology Results    Metastatic adenocarcinoma TTF-1 negative      06/03/2010 Surgery    Left video-assisted thoracoscopic surgery, resection of left lower lobe lesion. with Dr. Arlyce Dice      06/04/2010 Pathology Results    adenocarcinoma positive for cytokeratin 7, focally positive for CDX2, with scattered staining for TTF1 cytokeratin 20. [facility] opinion felt to be lung primary although noted to stain for GI markers, not felt to be pancreatic primary. EGFR and ALK negative      03/31/2011 - 09/04/2011 Radiation Therapy    SBRT LUL 18Gy Dr. Tammi Klippel      03/15/2012 - 06/13/2012 Chemotherapy    5-FU based chemotherapy      02/13/2013 - 05/20/2013 Chemotherapy    Gemzar Abraxane for metastatic disease (given to cover both primaries at [facility])      07/15/2014 PET scan    Mild progression of pulm mets, hypermet mesenteric tumor assoc with  SMA is stable to slightly increased in degree of FDG uptake      07/23/2014 Imaging         07/30/2014 - 09/10/2014 Chemotherapy    Gemzar abraxane stopped due to poor tolerance      04/06/2015 Procedure    L 20 French chest tube for spontaneous pneumothorax, by Dr. Tharon Aquas Trigt      04/20/2015 Pathology Results    High grade carcinoma c/w patient's known pancreatic primary (secondary opinion at [facility]) PDL-1 high expression, preserved major and minor MMR, MSI stable, [facility] ONE testing performed,       04/20/2015 Surgery    Left VATS, mini  thoracotomy with stapling and oversewing of blebs, Talc pleurodesis. performed secondary to persistent air leak not improved with chest tube drainage      07/29/2015 Imaging    No signif change in appear of multifocal B cavitary pulmonary lesions, postop appearance upper abdomen, no findings of met disease to abd or pelvis,      09/29/2015 Imaging    Slight progression of multifocal cavitary pulmonary metastases, as discussed above. 2. Interval development of a lytic lesion in the antral lateral aspect of the left fifth rib where there is a nondisplaced pathologic fracture. No other definite osseous metastases are identified. 3. Prominent soft tissue adjacent to the proximal superior mesenteric artery and in the aortocaval nodal station, similar to prior examinations, likely to represent treated metastatic lymphadenopathy. 4. Epigastric ventral hernia containing several short loops of small bowel. There is a focally dilated loop of small bowel adjacent to this, however, this is an isolated finding. No other dilatation of small bowel or colon to suggest frank bowel obstruction at this time.       10/19/2015 -  Chemotherapy    The patient had palonosetron (ALOXI) injection 0.25 mg, 0.25 mg, Intravenous,  Once, 1 of 4 cycles  leucovorin 652 mg in dextrose 5 % 250 mL infusion, 400 mg/m2 = 652 mg, Intravenous,  Once, 1 of 4 cycles  fluorouracil (ADRUCIL) 2,950 mg in sodium chloride 0.9 % 91 mL chemo infusion, 1,800 mg/m2 = 2,950 mg (100 % of original dose 1,800 mg/m2), Intravenous, 1 Day/Dose, 1 of 4 cycles Dose modification: 1,920 mg/m2 (original dose 1,800 mg/m2, Cycle 1, Reason: Provider Judgment), 1,800 mg/m2 (original dose 1,800 mg/m2, Cycle 1, Reason: Provider Judgment)  irinotecan LIPOSOME (ONIVYDE) 55.9 mg in sodium chloride 0.9 % 500 mL chemo infusion, 35 mg/m2 = 55.9 mg (100 % of original dose 35 mg/m2), Intravenous, Once, 1 of 4 cycles Dose modification: 35 mg/m2 (original dose 35  mg/m2, Cycle 1, Reason: Provider Judgment)  for chemotherapy treatment.         HISTORY OF PRESENTING ILLNESS:  [name] [name] 75 y.o. female is here for a follow up of stage IV pancreatic carcinoma.  Patient is experiencing a cough. She finished taking z-pack and Robitussin but symptoms have not subsided. [name] [name] has had a sinus infection in the past and states this feels like a sinus infection. She has been coughing up a pale yellow mucus.   [name] [name] is also experiencing soreness in her ribs. She believes this is from coughing so much.   Her appetite remains unchanged. She continues to drink Ensure. Patient states she experiences a chemo taste.   [name] [name] reports no changes in her mood. She is still able to do her ADL's with some rest. She denies excessive fatigue.  She dresses daily, enjoys family and friends. SOB with exertion is chronic and unchanged.    MEDICAL HISTORY:  Past Medical History:  Diagnosis Date  . Abdominal wall hernia   . Aortic aneurysm (HCC)    small aortic aneurysm  . Cancer [facility]) July 2010   Pancreatic adenocarcinoma  . Cataract 2010   b/l surgery  . Cervical cancer ([place]) 1963  . COPD (chronic obstructive pulmonary disease) ([place])   . Emphysema   . Hypercholesterolemia   . Hypertension   . Pancreatic cancer metastasized to lung [facility]) 09/04/2015    SURGICAL HISTORY: Past Surgical History:  Procedure Laterality Date  . BREAST SURGERY  1988   Right breast abcess  . BUNIONECTOMY  2007  . CHOLECYSTECTOMY  July 2010   done w/ Whipple procedure  . EYE SURGERY  2009   Bilateral cataract  . STAPLING OF BLEBS Left 04/20/2015   Procedure: STAPLING OF BLEBS;  Surgeon: Ivin Poot, MD;  Location: [place];  Service: Thoracic;  Laterality: Left;  . TONSILLECTOMY  1943  . VAGINAL HYSTERECTOMY  1963   Cervical Cancer  . VIDEO ASSISTED THORACOSCOPY Left 04/20/2015   Procedure: VIDEO ASSISTED THORACOSCOPY;  Surgeon: Ivin Poot, MD;  Location: [place];  Service:  Thoracic;  Laterality: Left;  . WHIPPLE PROCEDURE  July 2010   pancreatic adenocarcinoma    SOCIAL HISTORY: Social History   Social History  . Marital status: Widowed    Spouse name: N/A  . Number of children: 0  . Years of education: N/A   Occupational History  .  Retired   Social History Main Topics  . Smoking status: Former Smoker    Packs/day: 1.00    Years: 30.00    Types: Cigarettes    Quit date: 02/29/1996  . Smokeless tobacco: Never Used  . Alcohol use No  . Drug use: No  . Sexual activity: No   Other Topics Concern  . Not on file   Social History Narrative  . No narrative on file   Widowed; 5  years ago in December. Married 32 years, no children. Had cervical cancer when she was 21 Pancreatic cancer in July of 2010. Worked at [employer] for 83 years; also worked i Solicitor, at [employer], Social research officer, government. Smoked, but quit 15-20 years ago.  Enjoys quilting, crocheting, knitting. Has nieces and nephews.  Was born in [place].  FAMILY HISTORY: Family History  Problem Relation Age of Onset  . Pancreatic cancer Brother   . Colon cancer Sister 80    12/2006 dx surgery and chemotherapy  . Ovarian cancer Sister 15    Ovarian ca,  surgery and chemotherapy    One older sister and one younger sister still living. Four brothers deceased. Daddy died of emphysema back in April 13, 1973 at the age of 49-68 Mother was 47 when she died in 1987-04-14. Had 4 brothers, died from 1998/04/13 to April 13, 2006 Heart trouble, emphysema & dialysis ("he just had everything"). One brother had lung disease. One brother had pancreatic cancer and lived less than 2 months. [name] [name] has had ovarian and colon cancer. Nephew that died last year with brain cancer. Niece with cancer being treated here.   ALLERGIES:  is allergic to oxaliplatin and aspirin.  MEDICATIONS:  Current Outpatient Prescriptions  Medication Sig Dispense Refill  . azithromycin (ZITHROMAX Z-PAK) 250 MG tablet Take two tablets on day one, then one tablet daily  until finished 6 each 0  . diphenoxylate-atropine (LOMOTIL) 2.5-0.025 MG tablet Take 1 tablet by mouth 4 (four) times daily as needed for diarrhea or loose stools. 30 tablet 0  . fluorouracil CALGB 57017 in sodium chloride 0.9 % 150 mL Inject into the vein. To be given every 14 days over 46 hours.    . hydrochlorothiazide (HYDRODIURIL) 25 MG tablet Take by mouth.    Marland Kitchen ibuprofen (ADVIL,MOTRIN) 200 MG tablet Take 400 mg by mouth every 6 (six) hours as needed.    . IRINOTECAN HCL IV Inject into the vein. To be given every 14 days.    Marland Kitchen leucovorin in dextrose 5 % 250 mL Inject into the vein once. To be given every 14 days    . lidocaine-prilocaine (EMLA) cream Apply a quarter size amount to port site 1 hour prior to chemo. Do not rub in. Cover with plastic wrap. 30 g 3  . lipase/protease/amylase (CREON) 36000 UNITS CPEP capsule Take 1 capsule (36,000 Units total) by mouth 3 (three) times daily with meals. 270 capsule 1  . lipase/protease/amylase (CREON) 36000 UNITS CPEP capsule Take by mouth.    . loperamide (IMODIUM A-D) 2 MG tablet At the onset of diarrhea, take 2 tabs. Then take 1 tab every 2 hours until you have gone 12 hours without having a loose stool. If it is bedtime and you have a loose stool(s) take 2 tabs every 4 hours until morning. Call [facility]. 60 tablet 1  . loperamide (IMODIUM) 2 MG capsule     . losartan (COZAAR) 100 MG tablet     . metoprolol succinate (TOPROL-XL) 50 MG 24 hr tablet Take 25 mg by mouth daily before breakfast. Take with or immediately following a meal.    . nystatin cream (MYCOSTATIN)     . omeprazole (PRILOSEC) 20 MG capsule Take 20 mg by mouth daily.     . ondansetron (ZOFRAN) 8 MG tablet Take 1 tablet (8 mg total) by mouth every 8 (eight) hours as needed for nausea or vomiting. 30 tablet 2  . Potassium Bicarb-Citric Acid (EFFER-K) 10 MEQ TBEF Take 1 tablet (10 mEq total) by mouth  2 (two) times daily. 60 each 0  . potassium chloride SA (K-DUR,KLOR-CON) 20 MEQ  tablet Take by mouth.    . prochlorperazine (COMPAZINE) 10 MG tablet Take 1 tablet (10 mg total) by mouth every 6 (six) hours as needed for nausea or vomiting. 30 tablet 2  . XARELTO 20 MG TABS tablet      No current facility-administered medications for this visit.     Review of Systems  HENT: Positive for congestion.   Eyes: Negative.   Respiratory: Positive for cough.   Cardiovascular: Negative.   Gastrointestinal: Positive for diarrhea and nausea. Negative for abdominal pain, constipation and vomiting.       Nausea managed with medication. Diarrhea managed with imodium  Genitourinary: Negative.   Musculoskeletal:       Sore ribs  Skin: Negative.   Neurological: Negative.   Endo/Heme/Allergies: Negative.   Psychiatric/Behavioral: Negative.   All other systems reviewed and are negative. 14 point ROS was done and is otherwise as detailed above or in HPI   PHYSICAL EXAMINATION: ECOG PERFORMANCE STATUS: 1 - Symptomatic but completely ambulatory  Vitals:   12/22/15 0834  BP: (!) 150/57  Pulse: 70  Resp: 18  Temp: 97.6 F (36.4 C)   Filed Weights   12/22/15 0834  Weight: 126 lb 12.8 oz (57.5 kg)    Physical Exam  Constitutional: She is oriented to person, place, and time and well-developed, well-nourished, and in no distress. No distress.  Well groomed  HENT:  Head: Normocephalic and atraumatic.  Mouth/Throat: Oropharynx is clear and moist. No oropharyngeal exudate.  Eyes: Conjunctivae and EOM are normal. Pupils are equal, round, and reactive to light. Right eye exhibits no discharge. Left eye exhibits no discharge. No scleral icterus.  Neck: Normal range of motion. Neck supple. No tracheal deviation present. No thyromegaly present.  Cardiovascular: Normal rate, regular rhythm and intact distal pulses.   No murmur heard. Pulmonary/Chest: Effort normal and breath sounds normal.  History of emphysema.  Abdominal: Soft. Bowel sounds are normal. She exhibits no  distension and no mass. There is no tenderness. There is no rebound and no guarding.  Abdominal hernia  Musculoskeletal: Normal range of motion. She exhibits no edema or tenderness.  Lymphadenopathy:    She has no cervical adenopathy.  Neurological: She is alert and oriented to person, place, and time. Gait normal. Coordination normal.  Skin: Skin is warm and dry. She is not diaphoretic.  Psychiatric: Mood, memory, affect and judgment normal.  Nursing note and vitals reviewed.   LABORATORY DATA:  I have reviewed the data as listed   Results for Lauder, Kaelah M (MRN [id number]) as of 12/22/2015 09:24  Ref. Range 12/22/2015 08:47  WBC Latest Ref Range: 4.0 - 10.5 K/uL 6.1  RBC Latest Ref Range: 3.87 - 5.11 MIL/uL 3.17 (L)  Hemoglobin Latest Ref Range: 12.0 - 15.0 g/dL 9.5 (L)  HCT Latest Ref Range: 36.0 - 46.0 % 30.1 (L)  MCV Latest Ref Range: 78.0 - 100.0 fL 95.0  MCH Latest Ref Range: 26.0 - 34.0 pg 30.0  MCHC Latest Ref Range: 30.0 - 36.0 g/dL 31.6  RDW Latest Ref Range: 11.5 - 15.5 % 15.5  Platelets Latest Ref Range: 150 - 400 K/uL 147 (L)  Neutrophils Latest Units: % 72  Lymphocytes Latest Units: % 17  Monocytes Relative Latest Units: % 10  Eosinophil Latest Units: % 1  Basophil Latest Units: % 0  NEUT# Latest Ref Range: 1.7 - 7.7 K/uL 4.4  Lymphocyte #   Latest Ref Range: 0.7 - 4.0 K/uL 1.1  Monocyte # Latest Ref Range: 0.1 - 1.0 K/uL 0.6  Eosinophils Absolute Latest Ref Range: 0.0 - 0.7 K/uL 0.1  Basophils Absolute Latest Ref Range: 0.0 - 0.1 K/uL 0.0   PATHOLOGY     2012                  RADIOGRAPHIC STUDIES: I have personally reviewed the radiological images as listed and agreed with the findings in the report. No results found. Study Result   CLINICAL DATA:  Subsequent evaluation of a 75-year-old female with history of stage IV pancreatic cancer with metastatic disease to both lungs. Originally diagnosed in 2010 status post surgical resection,  chemotherapy and radiation therapy. No current complaints.  EXAM: CT CHEST, ABDOMEN, AND PELVIS WITH CONTRAST  TECHNIQUE: Multidetector CT imaging of the chest, abdomen and pelvis was performed following the standard protocol during bolus administration of intravenous contrast.  CONTRAST:  100mL ISOVUE-300 IOPAMIDOL (ISOVUE-300) INJECTION 61%  COMPARISON:  Multiple priors, most recently CT of the chest, abdomen and pelvis 07/29/2015.  FINDINGS: CT CHEST FINDINGS  Cardiovascular: Heart size is borderline enlarged. There is no significant pericardial fluid, thickening or pericardial calcification. There is aortic atherosclerosis, as well as atherosclerosis of the great vessels of the mediastinum and the coronary arteries, including calcified atherosclerotic plaque in the left main, left anterior descending, left circumflex and right coronary arteries. Faint calcifications of the aortic valve. Right internal jugular single-lumen porta cath with tip terminating in the distal superior vena cava.  Mediastinum/Nodes: No pathologically enlarged mediastinal or hilar lymph nodes. 1.8 cm low-attenuation nodule in the right lobe of the thyroid gland is similar to prior studies, and nonspecific, but likely benign. Esophagus is normal in appearance. No axillary lymphadenopathy.  Lungs/Pleura: Again noted are multifocal highly irregular areas of nodular and mass-like appearing architectural distortion in the lungs bilaterally. These are most evident throughout the mid to lower lungs, and presumably reflect widespread metastatic disease. The largest of these lesions is a pleural-based cavitary mass in the posterior aspect of the right lower lobe (image 113 of series 6) which has enlarged in size and currently measures 6.9 x 4.0 cm. The other smaller lesions are are generally stable to slightly increased in size compared to the prior study, although direct comparison between  examinations is challenging due to the highly irregular shape of these lesions. No new pulmonary lesions are noted. Postoperative changes of wedge resection are noted in the right upper lobe. Mild diffuse bronchial wall thickening with severe centrilobular and paraseptal emphysema. No acute consolidative airspace disease. No pleural effusions.  Musculoskeletal: Lytic lesion in the anterolateral aspect of the left fifth rib where there is a pathologic fracture (image 77 of series 6). Multiple other old and healing left-sided rib fractures are noted.  CT ABDOMEN PELVIS FINDINGS  Hepatobiliary: Pneumobilia, presumably related to prior sphincterotomy. No suspicious cystic or solid hepatic lesions. Status post cholecystectomy.  Pancreas: Postoperative changes of prior Whipple procedure are again noted. Gas is noted in the main pancreatic duct.  Spleen: Unremarkable.  Adrenals/Urinary Tract: 1.8 x 1.5 cm right adrenal nodule is similar to prior examinations, and was not hypermetabolic on prior PET-CT 07/15/2014, presumably a benign lesion such as an adenoma. Mild adrenal thickening of the left adrenal gland is unchanged. Right kidney is normal in appearance. 1.7 cm simple cyst in the lower pole of the left kidney. No hydroureteronephrosis. Urinary bladder is normal in appearance.  Stomach/Bowel: Postoperative changes related   to prior Whipple procedure are again noted. Ventral hernia containing short segments of small bowel in the epigastric region. There is some mild dilatation of a loop of small bowel adjacent to the ventral hernia in the epigastric region (image 61 of series 2) which measures up to 4.4 cm in diameter. However, there is no other pathologic dilatation of small bowel or colon to suggest frank bowel obstruction at this time. Normal appendix.  Vascular/Lymphatic: Fusiform aneurysmal dilatation of the infrarenal abdominal aorta which measures up to 3.9 x 3.5 cm  (image 68 of series 2), similar to prior examinations. Prominent soft tissue adjacent to the proximal superior mesenteric artery and in the aortocaval region (axial images 60-63 of series 2) measuring up to 2.5 x 1.8 cm, likely to represent treated lymphadenopathy. No other lymphadenopathy noted elsewhere in the abdomen or pelvis.  Reproductive: Status post hysterectomy. Ovaries are not confidently identified may be surgically absent or atrophic.  Other: No significant volume of ascites.  No pneumoperitoneum.  Musculoskeletal: There are no aggressive appearing lytic or blastic lesions noted in the visualized portions of the skeleton.  IMPRESSION: 1. Slight progression of multifocal cavitary pulmonary metastases, as discussed above. 2. Interval development of a lytic lesion in the antral lateral aspect of the left fifth rib where there is a nondisplaced pathologic fracture. No other definite osseous metastases are identified. 3. Prominent soft tissue adjacent to the proximal superior mesenteric artery and in the aortocaval nodal station, similar to prior examinations, likely to represent treated metastatic lymphadenopathy. 4. Epigastric ventral hernia containing several short loops of small bowel. There is a focally dilated loop of small bowel adjacent to this, however, this is an isolated finding. No other dilatation of small bowel or colon to suggest frank bowel obstruction at this time. 5. Aortic atherosclerosis, in addition to left main and 3 vessel coronary artery disease. In addition, there is a 3.9 x 3.5 cm fusiform infrarenal abdominal aortic aneurysm which is similar to prior studies. Recommend followup by ultrasound in 2 years. This recommendation follows ACR consensus guidelines: White Paper of the ACR Incidental Findings Committee II on Vascular Findings. J Am Coll Radiol 2013; 10:789-794. 6. Additional incidental findings, as above.   Electronically Signed    By: Vinnie Langton M.D.   On: 09/29/2015 16:11    ASSESSMENT & PLAN:  Stage IV Pancreatic Adenocarcinoma Lung Adenocarcinoma RIJ thrombosis Transverse Sinus Thrombosis Chronic Anticoagulation with lovenox L pneumothorax, spontaneous Malignant Pleural Effusion, Talc Pleurodesis Preserved expression of major/minor MMR proteins done on 2017 LUL biopsy by Dr. Prescott Gum Dyspnea Anemia  Complicated course but she has certainly done well for many years. She and her sister are not unrealistic. She has a HCPOA and living will in place. We discussed that although her pancreatic cancer is PDL1 positive this unfortunately does not translate into benefit from immunotherapy, MSI - high status does and unfortunately, her disease is MSI - stable. [name] [name] has been experiencing a cough that has not subsided with z-pack or Robitussin. I have ordered Levaquin. She is quite convinced that her symptoms are consistent with prior sinus infections she has had.  I have ordered a CXR to rule out the possibility however of pneumonia. She is agreeable. No fever.   Follow up with patient after her next cycle of chemotherapy if CXR clear today.  She is due for repeat imaging. We will order this after her next cycle of therapy.  Orders Placed This Encounter  Procedures  . DG Chest  2 View    JH/ESIGNED    Standing Status:   Future    Number of Occurrences:   1    Standing Expiration Date:   12/21/2016    Order Specific Question:   Reason for Exam (SYMPTOM  OR DIAGNOSIS REQUIRED)    Answer:   persistent cough, r sided pain, sputum production    Order Specific Question:   Preferred imaging location?    Answer:   [facility]   Meds ordered this encounter  Medications  . levofloxacin (LEVAQUIN) 250 MG tablet    Sig: Take 2 tablets on day 1 then one tablet daily thereafter until gone    Dispense:  15 tablet    Refill:  0    All questions were answered. The patient knows to call the clinic with any  problems, questions or concerns.  This document serves as a record of services personally performed by  , MD. It was created on her behalf by Dana Deeb, a trained medical scribe. The creation of this record is based on the scribe's personal observations and the provider's statements to them. This document has been checked and approved by the attending provider.  I have reviewed the above documentation for accuracy and completeness and I agree with the above.  This note was electronically signed.    , Kristen, MD  12/22/2015 9:25 AM      

## 2015-12-23 LAB — CANCER ANTIGEN 19-9: CA 19 9: 374 U/mL — AB (ref 0–35)

## 2015-12-23 NOTE — Patient Instructions (Signed)
[facility] Discharge Instructions for Patients Receiving Chemotherapy   Beginning January 23rd 2017 lab work for the [facility] will be done in the  Main lab at [facility] on 1st floor. If you have a lab appointment with the [place] please come in thru the  Main Entrance and check in at the main information desk   Today you received the following chemotherapy agents:  Irinotecan, leucovorin and 7f  To help prevent nausea and vomiting after your treatment, we encourage you to take your nausea medication as prescribed.  If you develop nausea and vomiting, or diarrhea that is not controlled by your medication, call the clinic.  The clinic phone number is [phone] Office hours are Monday-Friday 8:30am-5:00pm.  BELOW ARE SYMPTOMS THAT SHOULD BE REPORTED IMMEDIATELY:  *FEVER GREATER THAN 101.0 F  *CHILLS WITH OR WITHOUT FEVER  NAUSEA AND VOMITING THAT IS NOT CONTROLLED WITH YOUR NAUSEA MEDICATION  *UNUSUAL SHORTNESS OF BREATH  *UNUSUAL BRUISING OR BLEEDING  TENDERNESS IN MOUTH AND THROAT WITH OR WITHOUT PRESENCE OF ULCERS  *URINARY PROBLEMS  *BOWEL PROBLEMS  UNUSUAL RASH Items with * indicate a potential emergency and should be followed up as soon as possible. If you have an emergency after office hours please contact your primary care physician or go to the nearest emergency department.  Please call the clinic during office hours if you have any questions or concerns.   You may also contact the Patient Navigator at [phone] you have any questions or need assistance in obtaining follow up care.      Resources For Cancer Patients and their Caregivers ? American Cancer Society: Can assist with transportation, wigs, general needs, runs Look Good Feel Better.        1639-309-2491? Cancer Care: Provides financial assistance, online support groups, medication/co-pay assistance.  1-800-813-HOPE ((507)026-0364 ? [place] cancer patients and their families through emotional , educational and financial support.  3519-849-0938? [place] DSS Where to apply for food stamps, Medicaid and utility assistance. 3276 650 4596? RCATS: Transportation to medical appointments. 3937-558-4622? Social Security Administration: May apply for disability if have a Stage IV cancer. [phone]? [employer]: Assists with nutrition, care and transit needs. [phone]

## 2015-12-24 ENCOUNTER — Encounter (HOSPITAL_COMMUNITY): Payer: Medicare Other | Attending: Adult Health

## 2015-12-24 VITALS — BP 116/61 | HR 69 | Temp 98.2°F | Resp 18

## 2015-12-24 DIAGNOSIS — C259 Malignant neoplasm of pancreas, unspecified: Secondary | ICD-10-CM | POA: Diagnosis not present

## 2015-12-24 DIAGNOSIS — C7800 Secondary malignant neoplasm of unspecified lung: Secondary | ICD-10-CM | POA: Insufficient documentation

## 2015-12-24 DIAGNOSIS — Z95828 Presence of other vascular implants and grafts: Secondary | ICD-10-CM | POA: Insufficient documentation

## 2015-12-24 DIAGNOSIS — Z452 Encounter for adjustment and management of vascular access device: Secondary | ICD-10-CM | POA: Diagnosis not present

## 2015-12-24 DIAGNOSIS — R197 Diarrhea, unspecified: Secondary | ICD-10-CM | POA: Insufficient documentation

## 2015-12-24 DIAGNOSIS — C7802 Secondary malignant neoplasm of left lung: Secondary | ICD-10-CM | POA: Diagnosis not present

## 2015-12-24 DIAGNOSIS — R7989 Other specified abnormal findings of blood chemistry: Secondary | ICD-10-CM | POA: Insufficient documentation

## 2015-12-24 MED ORDER — SODIUM CHLORIDE 0.9% FLUSH
10.0000 mL | INTRAVENOUS | Status: DC | PRN
  Administered 2015-12-24: 10 mL
  Filled 2015-12-24: qty 10

## 2015-12-24 MED ORDER — HEPARIN SOD (PORK) LOCK FLUSH 100 UNIT/ML IV SOLN
500.0000 [IU] | Freq: Once | INTRAVENOUS | Status: AC | PRN
  Administered 2015-12-24: 500 [IU]

## 2015-12-24 NOTE — Patient Instructions (Signed)
[place] at [facility] Discharge Instructions  RECOMMENDATIONS MADE BY THE CONSULTANT AND ANY TEST RESULTS WILL BE SENT TO YOUR REFERRING PHYSICIAN.  5FU pump discontinued and port flushed per protocol today. Follow-up as scheduled. Call clinic for any questions or concerns  Thank you for choosing [place] at [facility] to provide your oncology and hematology care.  To afford each patient quality time with our provider, please arrive at least 15 minutes before your scheduled appointment time.   Beginning January 23rd 2017 lab work for the [facility] will be done in the  Main lab at [facility] on 1st floor. If you have a lab appointment with the [facility] please come in thru the  Main Entrance and check in at the main information desk  You need to re-schedule your appointment should you arrive 10 or more minutes late.  We strive to give you quality time with our providers, and arriving late affects you and other patients whose appointments are after yours.  Also, if you no show three or more times for appointments you may be dismissed from the clinic at the providers discretion.     Again, thank you for choosing [facility]: [facility]  Our hope is that these requests will decrease the amount of time that you wait before being seen by our physicians.       _____________________________________________________________  Should you have questions after your visit to [facility], please contact our office at [phone] between the hours of 8:30 a.m. and 4:30 p.m.  Voicemails left after 4:30 p.m. will not be returned until the following business day.  For prescription refill requests, have your pharmacy contact our office.         Resources For Cancer Patients and their Caregivers ? American Cancer Society: Can assist with transportation, wigs, general needs, runs Look Good Feel Better.        ([phone] ? Cancer  Care: Provides financial assistance, online support groups, medication/co-pay assistance.  1-800-813-HOPE 515-509-0079) ? [place] Assists [place] cancer patients and their families through emotional , educational and financial support.  478-805-6218 ? [place] DSS Where to apply for food stamps, Medicaid and utility assistance. (306)454-3272 ? RCATS: Transportation to medical appointments. 417-335-2403 ? Social Security Administration: May apply for disability if have a Stage IV cancer. 548-425-7367 [phone]) [phone] ? [employer], Disability and Transit Services: Assists with nutrition, care and transit needs. [place] Support Programs: '@10RELATIVEDAYS'$ @ > Cancer Support Group  2nd Tuesday of the month 1pm-2pm, Journey Room  > Creative Journey  3rd Tuesday of the month 1130am-1pm, Journey Room  > Look Good Feel Better  1st Wednesday of the month 10am-12 noon, Journey Room (Call [place] to register [phone])

## 2015-12-24 NOTE — Progress Notes (Signed)
[name] [name] tolerated 5FU pump well without complaints or incident. 5FU pump discontinued and port flushed with 10 ml NS and 5 ml Heparin easily per protocol. VSS. Pt discharged self ambulatory in satisfactory condition

## 2015-12-28 ENCOUNTER — Ambulatory Visit (HOSPITAL_COMMUNITY): Payer: Medicare Other

## 2015-12-30 ENCOUNTER — Encounter (HOSPITAL_COMMUNITY): Payer: Medicare Other

## 2016-01-02 ENCOUNTER — Encounter (HOSPITAL_COMMUNITY): Payer: Self-pay | Admitting: Hematology & Oncology

## 2016-01-05 ENCOUNTER — Encounter (HOSPITAL_BASED_OUTPATIENT_CLINIC_OR_DEPARTMENT_OTHER): Payer: Medicare Other | Admitting: Oncology

## 2016-01-05 ENCOUNTER — Encounter: Payer: Self-pay | Admitting: *Deleted

## 2016-01-05 ENCOUNTER — Encounter (HOSPITAL_BASED_OUTPATIENT_CLINIC_OR_DEPARTMENT_OTHER): Payer: Medicare Other

## 2016-01-05 VITALS — BP 100/32 | HR 50 | Temp 97.7°F | Resp 18 | Wt 125.8 lb

## 2016-01-05 DIAGNOSIS — R197 Diarrhea, unspecified: Secondary | ICD-10-CM | POA: Diagnosis not present

## 2016-01-05 DIAGNOSIS — C7800 Secondary malignant neoplasm of unspecified lung: Secondary | ICD-10-CM

## 2016-01-05 DIAGNOSIS — Z95828 Presence of other vascular implants and grafts: Secondary | ICD-10-CM | POA: Diagnosis not present

## 2016-01-05 DIAGNOSIS — R634 Abnormal weight loss: Secondary | ICD-10-CM

## 2016-01-05 DIAGNOSIS — Z5111 Encounter for antineoplastic chemotherapy: Secondary | ICD-10-CM | POA: Diagnosis present

## 2016-01-05 DIAGNOSIS — C259 Malignant neoplasm of pancreas, unspecified: Secondary | ICD-10-CM

## 2016-01-05 LAB — CBC WITH DIFFERENTIAL/PLATELET
Basophils Absolute: 0 10*3/uL (ref 0.0–0.1)
Basophils Relative: 0 %
EOS PCT: 1 %
Eosinophils Absolute: 0 10*3/uL (ref 0.0–0.7)
HCT: 29.2 % — ABNORMAL LOW (ref 36.0–46.0)
Hemoglobin: 9.4 g/dL — ABNORMAL LOW (ref 12.0–15.0)
LYMPHS ABS: 1.5 10*3/uL (ref 0.7–4.0)
LYMPHS PCT: 28 %
MCH: 30.7 pg (ref 26.0–34.0)
MCHC: 32.2 g/dL (ref 30.0–36.0)
MCV: 95.4 fL (ref 78.0–100.0)
MONO ABS: 0.7 10*3/uL (ref 0.1–1.0)
Monocytes Relative: 13 %
Neutro Abs: 3.2 10*3/uL (ref 1.7–7.7)
Neutrophils Relative %: 58 %
PLATELETS: 135 10*3/uL — AB (ref 150–400)
RBC: 3.06 MIL/uL — ABNORMAL LOW (ref 3.87–5.11)
RDW: 15.9 % — AB (ref 11.5–15.5)
WBC: 5.5 10*3/uL (ref 4.0–10.5)

## 2016-01-05 LAB — COMPREHENSIVE METABOLIC PANEL
ALT: 13 U/L — ABNORMAL LOW (ref 14–54)
ANION GAP: 6 (ref 5–15)
AST: 19 U/L (ref 15–41)
Albumin: 3.5 g/dL (ref 3.5–5.0)
Alkaline Phosphatase: 100 U/L (ref 38–126)
BUN: 23 mg/dL — ABNORMAL HIGH (ref 6–20)
CHLORIDE: 113 mmol/L — AB (ref 101–111)
CO2: 19 mmol/L — ABNORMAL LOW (ref 22–32)
CREATININE: 1.38 mg/dL — AB (ref 0.44–1.00)
Calcium: 9.2 mg/dL (ref 8.9–10.3)
GFR, EST AFRICAN AMERICAN: 42 mL/min — AB (ref >60)
GFR, EST NON AFRICAN AMERICAN: 36 mL/min — AB (ref >60)
Glucose, Bld: 87 mg/dL (ref 65–99)
POTASSIUM: 4.3 mmol/L (ref 3.5–5.1)
Sodium: 138 mmol/L (ref 135–145)
Total Bilirubin: 0.5 mg/dL (ref 0.3–1.2)
Total Protein: 6.5 g/dL (ref 6.5–8.1)

## 2016-01-05 MED ORDER — SODIUM CHLORIDE 0.9 % IV SOLN
1500.0000 mg/m2 | INTRAVENOUS | Status: DC
  Administered 2016-01-05: 2450 mg via INTRAVENOUS
  Filled 2016-01-05: qty 49

## 2016-01-05 MED ORDER — PROCHLORPERAZINE MALEATE 10 MG PO TABS
10.0000 mg | ORAL_TABLET | Freq: Once | ORAL | Status: AC
  Administered 2016-01-05: 10 mg via ORAL
  Filled 2016-01-05: qty 1

## 2016-01-05 MED ORDER — SODIUM CHLORIDE 0.9% FLUSH
10.0000 mL | INTRAVENOUS | Status: DC | PRN
  Administered 2016-01-05: 10 mL
  Filled 2016-01-05: qty 10

## 2016-01-05 MED ORDER — SODIUM CHLORIDE 0.9 % IV SOLN
35.0000 mg/m2 | Freq: Once | INTRAVENOUS | Status: AC
  Administered 2016-01-05: 55.9 mg via INTRAVENOUS
  Filled 2016-01-05: qty 13

## 2016-01-05 MED ORDER — LEUCOVORIN CALCIUM INJECTION 350 MG
400.0000 mg/m2 | Freq: Once | INTRAVENOUS | Status: AC
  Administered 2016-01-05: 652 mg via INTRAVENOUS
  Filled 2016-01-05: qty 32.6

## 2016-01-05 MED ORDER — PALONOSETRON HCL INJECTION 0.25 MG/5ML
0.2500 mg | Freq: Once | INTRAVENOUS | Status: AC
  Administered 2016-01-05: 0.25 mg via INTRAVENOUS
  Filled 2016-01-05: qty 5

## 2016-01-05 MED ORDER — SODIUM CHLORIDE 0.9 % IV SOLN
Freq: Once | INTRAVENOUS | Status: AC
  Administered 2016-01-05: 11:00:00 via INTRAVENOUS

## 2016-01-05 NOTE — Patient Instructions (Signed)
[facility] Discharge Instructions for Patients Receiving Chemotherapy   Beginning January 23rd 2017 lab work for the [facility] will be done in the  Main lab at [facility] on 1st floor. If you have a lab appointment with the [place] please come in thru the  Main Entrance and check in at the main information desk   Today you received the following chemotherapy agents Irinotecan, leucovorin and 87f pump.  If you develop nausea and vomiting, or diarrhea that is not controlled by your medication, call the clinic.  The clinic phone number is [phone] Office hours are Monday-Friday 8:30am-5:00pm.  BELOW ARE SYMPTOMS THAT SHOULD BE REPORTED IMMEDIATELY:  *FEVER GREATER THAN 101.0 F  *CHILLS WITH OR WITHOUT FEVER  NAUSEA AND VOMITING THAT IS NOT CONTROLLED WITH YOUR NAUSEA MEDICATION  *UNUSUAL SHORTNESS OF BREATH  *UNUSUAL BRUISING OR BLEEDING  TENDERNESS IN MOUTH AND THROAT WITH OR WITHOUT PRESENCE OF ULCERS  *URINARY PROBLEMS  *BOWEL PROBLEMS  UNUSUAL RASH Items with * indicate a potential emergency and should be followed up as soon as possible. If you have an emergency after office hours please contact your primary care physician or go to the nearest emergency department.  Please call the clinic during office hours if you have any questions or concerns.   You may also contact the Patient Navigator at [phone] you have any questions or need assistance in obtaining follow up care.      Resources For Cancer Patients and their Caregivers ? American Cancer Society: Can assist with transportation, wigs, general needs, runs Look Good Feel Better.        1418 613 2799? Cancer Care: Provides financial assistance, online support groups, medication/co-pay assistance.  1-800-813-HOPE (531-417-1424 ? [place] cancer patients and their families through emotional , educational and financial  support.  [phone]? [place] DSS Where to apply for food stamps, Medicaid and utility assistance. 3(864)524-4863? [employer]ATS: Transportation to medical appointments. 3([phone]? Social Security Administration: May apply for disability if have a Stage IV cancer. [phone](712)614-9456? [employer]: Assists with nutrition, care and transit needs. [phone]

## 2016-01-05 NOTE — Assessment & Plan Note (Addendum)
Stage IV pancreatic carcinoma, she is also reported to have a history of stage IV BAC of the lungs. (On further review I feel she may have had stage I BAC of the lungs).  She is currently on  liposomal irinotecan/5-FU (dose reduced).  Oncology history is up to date.  Labs today: CBC diff, CMET.  I personally reviewed and went over laboratory results with the patient.  The results are noted within this dictation.   Labs satisfy treatment parameters today.  Labs in 2 weeks, with next treatment: CBC diff, CMET, CA 19-9.  She is due for re-imaging studies.  Orders are placed for CT CAP w contrast within the next 2 weeks.  We continued to discuss goals of care and she was very frank that quality of life is most important to her.  She is educated that we must always weigh the risks and benefits of ongoing treatment and therefore, delays or stopping treatment are reasonable and will most likely be needed.  She is realistic and prepared for transition of care when indicated.  Weight is down 2 lbs.  Burtis Junes, RD is following along.  Return in 2 weeks for follow-up with labs and to review restaging test results.

## 2016-01-05 NOTE — Progress Notes (Signed)
Tolerated chemo well. Continuous infusion pump intact. Stable and ambulatory on discharge home with sister.

## 2016-01-05 NOTE — Patient Instructions (Signed)
[place] at [facility] Discharge Instructions  RECOMMENDATIONS MADE BY THE CONSULTANT AND ANY TEST RESULTS WILL BE SENT TO YOUR REFERRING PHYSICIAN.  You were seen today by Kirby Crigler PA-C. Treatment today. Ct in 1-2 weeks and follow up in 2 weeks.   Thank you for choosing [place] at [facility] to provide your oncology and hematology care.  To afford each patient quality time with our provider, please arrive at least 15 minutes before your scheduled appointment time.   Beginning January 23rd 2017 lab work for the [facility] will be done in the  Main lab at [facility] on 1st floor. If you have a lab appointment with the [facility] please come in thru the  Main Entrance and check in at the main information desk  You need to re-schedule your appointment should you arrive 10 or more minutes late.  We strive to give you quality time with our providers, and arriving late affects you and other patients whose appointments are after yours.  Also, if you no show three or more times for appointments you may be dismissed from the clinic at the providers discretion.     Again, thank you for choosing [facility].  Our hope is that these requests will decrease the amount of time that you wait before being seen by our physicians.       _____________________________________________________________  Should you have questions after your visit to [facility], please contact our office at [phone] between the hours of 8:30 a.m. and 4:30 p.m.  Voicemails left after 4:30 p.m. will not be returned until the following business day.  For prescription refill requests, have your pharmacy contact our office.         Resources For Cancer Patients and their Caregivers ? American Cancer Society: Can assist with transportation, wigs, general needs, runs Look Good Feel Better.        ([phone] ? Cancer Care: Provides  financial assistance, online support groups, medication/co-pay assistance.  1-800-813-HOPE 806-359-2947) ? [place] Assists [place] cancer patients and their families through emotional , educational and financial support.  450-601-3699 ? [place] DSS Where to apply for food stamps, Medicaid and utility assistance. 515-245-8970 ? [employer]: Transportation to medical appointments. 804-169-8065 ? Social Security Administration: May apply for disability if have a Stage IV cancer. [phone] [phone] ? [employer], Disability and Transit Services: Assists with nutrition, care and transit needs. [place] Support Programs: '@10RELATIVEDAYS'$ @ > Cancer Support Group  2nd Tuesday of the month 1pm-2pm, Journey Room  > Creative Journey  3rd Tuesday of the month 1130am-1pm, Journey Room  > Look Good Feel Better  1st Wednesday of the month 10am-12 noon, Journey Room (Call [place] to register [phone])

## 2016-01-05 NOTE — Progress Notes (Signed)
[facility], MD [place] [address]  Pancreatic cancer metastasized to lung [facility]) - Plan: CT Abdomen Pelvis W Contrast, CT Chest W Contrast  CURRENT THERAPY: Liposome Irinotecan/5FU/Leucovorin  INTERVAL HISTORY: [name] [name] 75 y.o. female returns for followup of stage IV pancreatic carcinoma, she is also reported to have a history of stage IV BAC of the lungs. (On further review I feel she may have had stage I BAC of the lungs).  She is currently on  liposomal irinotecan/5-FU.      Pancreatic cancer metastasized to lung [facility])   09/03/2008 Surgery    Whipple with Dr. Birdie Sons for T3N1 3/12 LN+ Pancreatic Adenocarcinoma      11/13/2008 - 09/12/2009 Chemotherapy    Adjuvant Gemcitabine X 6 months      08/31/2009 Surgery    Metastectomy with Dr. Arlyce Dice RUL 2 nodules, RLL (wedge resection X 2)      08/31/2009 Pathology Results    Metastatic adenocarcinoma TTF-1 negative      06/03/2010 Surgery    Left video-assisted thoracoscopic surgery, resection of left lower lobe lesion. with Dr. Arlyce Dice      06/04/2010 Pathology Results    adenocarcinoma positive for cytokeratin 7, focally positive for CDX2, with scattered staining for TTF1 cytokeratin 20. [facility] opinion felt to be lung primary although noted to stain for GI markers, not felt to be pancreatic primary. EGFR and ALK negative      03/31/2011 - 09/04/2011 Radiation Therapy    SBRT LUL 18Gy Dr. Tammi Klippel      03/15/2012 - 06/13/2012 Chemotherapy    5-FU based chemotherapy      02/13/2013 - 05/20/2013 Chemotherapy    Gemzar Abraxane for metastatic disease (given to cover both primaries at [facility])      07/15/2014 PET scan    Mild progression of pulm mets, hypermet mesenteric tumor assoc with  SMA is stable to slightly increased in degree of FDG uptake      07/23/2014 Imaging         07/30/2014 - 09/10/2014 Chemotherapy    Gemzar abraxane stopped due to poor tolerance      04/06/2015 Procedure     L 20 French chest tube for spontaneous pneumothorax, by Dr. Tharon Aquas Trigt      04/20/2015 Pathology Results    High grade carcinoma c/w patient's known pancreatic primary (secondary opinion at [facility]) PDL-1 high expression, preserved major and minor MMR, MSI stable, Foundation ONE testing performed,       04/20/2015 Surgery    Left VATS, mini thoracotomy with stapling and oversewing of blebs, Talc pleurodesis. performed secondary to persistent air leak not improved with chest tube drainage      07/29/2015 Imaging    No signif change in appear of multifocal B cavitary pulmonary lesions, postop appearance upper abdomen, no findings of met disease to abd or pelvis,      09/29/2015 Imaging    Slight progression of multifocal cavitary pulmonary metastases, as discussed above. 2. Interval development of a lytic lesion in the antral lateral aspect of the left fifth rib where there is a nondisplaced pathologic fracture. No other definite osseous metastases are identified. 3. Prominent soft tissue adjacent to the proximal superior mesenteric artery and in the aortocaval nodal station, similar to prior examinations, likely to represent treated metastatic lymphadenopathy. 4. Epigastric ventral hernia containing several short loops of small bowel. There is a focally dilated loop of small bowel adjacent to  this, however, this is an isolated finding. No other dilatation of small bowel or colon to suggest frank bowel obstruction at this time.       10/19/2015 -  Chemotherapy    The patient had palonosetron (ALOXI) injection 0.25 mg, 0.25 mg, Intravenous,  Once, 1 of 4 cycles  leucovorin 652 mg in dextrose 5 % 250 mL infusion, 400 mg/m2 = 652 mg, Intravenous,  Once, 1 of 4 cycles  fluorouracil (ADRUCIL) 2,950 mg in sodium chloride 0.9 % 91 mL chemo infusion, 1,800 mg/m2 = 2,950 mg (100 % of original dose 1,800 mg/m2), Intravenous, 1 Day/Dose, 1 of 4 cycles Dose modification:  1,920 mg/m2 (original dose 1,800 mg/m2, Cycle 1, Reason: Provider Judgment), 1,800 mg/m2 (original dose 1,800 mg/m2, Cycle 1, Reason: Provider Judgment)  irinotecan LIPOSOME (ONIVYDE) 55.9 mg in sodium chloride 0.9 % 500 mL chemo infusion, 35 mg/m2 = 55.9 mg (100 % of original dose 35 mg/m2), Intravenous, Once, 1 of 4 cycles Dose modification: 35 mg/m2 (original dose 35 mg/m2, Cycle 1, Reason: Provider Judgment)  for chemotherapy treatment.        She denies any new complaints today.  She tolerated her last treatment well.  Review of Systems  Constitutional: Positive for weight loss. Negative for chills and fever.  HENT: Negative.   Eyes: Negative.   Respiratory: Negative.  Negative for cough.   Cardiovascular: Negative.  Negative for chest pain.  Gastrointestinal: Negative for constipation, diarrhea, nausea and vomiting.  Genitourinary: Negative.   Musculoskeletal: Negative.   Skin: Negative.   Endo/Heme/Allergies: Negative.   Psychiatric/Behavioral: Negative.     Past Medical History:  Diagnosis Date  . Abdominal wall hernia   . Aortic aneurysm (HCC)    small aortic aneurysm  . Cancer [facility]) July 2010   Pancreatic adenocarcinoma  . Cataract 2010   b/l surgery  . Cervical cancer ([place]) 1963  . COPD (chronic obstructive pulmonary disease) ([place])   . Emphysema   . Hypercholesterolemia   . Hypertension   . Pancreatic cancer metastasized to lung [facility]) 09/04/2015    Past Surgical History:  Procedure Laterality Date  . BREAST SURGERY  1988   Right breast abcess  . BUNIONECTOMY  2007  . CHOLECYSTECTOMY  July 2010   done w/ Whipple procedure  . EYE SURGERY  2009   Bilateral cataract  . STAPLING OF BLEBS Left 04/20/2015   Procedure: STAPLING OF BLEBS;  Surgeon: Ivin Poot, MD;  Location: [facility];  Service: Thoracic;  Laterality: Left;  . TONSILLECTOMY  1943  . VAGINAL HYSTERECTOMY  1963   Cervical Cancer  . VIDEO ASSISTED THORACOSCOPY Left 04/20/2015   Procedure: VIDEO  ASSISTED THORACOSCOPY;  Surgeon: Ivin Poot, MD;  Location: [facility];  Service: Thoracic;  Laterality: Left;  . WHIPPLE PROCEDURE  July 2010   pancreatic adenocarcinoma    Family History  Problem Relation Age of Onset  . Pancreatic cancer Brother   . Colon cancer Sister 37    12/2006 dx surgery and chemotherapy  . Ovarian cancer Sister 57    Ovarian ca,  surgery and chemotherapy    Social History   Social History  . Marital status: Widowed    Spouse name: N/A  . Number of children: 0  . Years of education: N/A   Occupational History  .  Retired   Social History Main Topics  . Smoking status: Former Smoker    Packs/day: 1.00    Years: 30.00    Types: Cigarettes  Quit date: 02/29/1996  . Smokeless tobacco: Never Used  . Alcohol use No  . Drug use: No  . Sexual activity: No   Other Topics Concern  . Not on file   Social History Narrative  . No narrative on file     PHYSICAL EXAMINATION  ECOG PERFORMANCE STATUS: 1 - Symptomatic but completely ambulatory  There were no vitals filed for this visit.  Vitals - 1 value per visit 92/33/0076  SYSTOLIC 226  DIASTOLIC 82  Pulse 84  Temperature 97.5  Respirations 18  Weight (lb) 125.8    GENERAL:alert, cooperative, smiling and accompanied by her sister. SKIN: skin color, texture, turgor are normal, no rashes or significant lesions HEAD: Normocephalic, No masses, lesions, tenderness or abnormalities EYES: normal, EOMI, Conjunctiva are pink and non-injected EARS: External ears normal OROPHARYNX:lips, buccal mucosa, and tongue normal and mucous membranes are moist  NECK: supple, trachea midline LYMPH:  not examined BREAST:not examined LUNGS: clear to auscultation  HEART: regular rate & rhythm ABDOMEN:abdomen soft, non-tender and normal bowel sounds BACK: Back symmetric, no curvature. EXTREMITIES:less then 2 second capillary refill, no joint deformities, effusion, or inflammation, no skin discoloration, no  cyanosis  NEURO: alert & oriented x 3 with fluent speech, no focal motor/sensory deficits, gait normal  LABORATORY DATA: CBC    Component Value Date/Time   WBC 5.5 01/05/2016 0932   RBC 3.06 (L) 01/05/2016 0932   HGB 9.4 (L) 01/05/2016 0932   HCT 29.2 (L) 01/05/2016 0932   PLT 135 (L) 01/05/2016 0932   MCV 95.4 01/05/2016 0932   MCH 30.7 01/05/2016 0932   MCHC 32.2 01/05/2016 0932   RDW 15.9 (H) 01/05/2016 0932   LYMPHSABS 1.5 01/05/2016 0932   MONOABS 0.7 01/05/2016 0932   EOSABS 0.0 01/05/2016 0932   BASOSABS 0.0 01/05/2016 0932      Chemistry      Component Value Date/Time   NA 138 01/05/2016 0932   K 4.3 01/05/2016 0932   CL 113 (H) 01/05/2016 0932   CO2 19 (L) 01/05/2016 0932   BUN 23 (H) 01/05/2016 0932   CREATININE 1.38 (H) 01/05/2016 0932      Component Value Date/Time   CALCIUM 9.2 01/05/2016 0932   ALKPHOS 100 01/05/2016 0932   AST 19 01/05/2016 0932   ALT 13 (L) 01/05/2016 0932   BILITOT 0.5 01/05/2016 0932       PENDING LABS:   RADIOGRAPHIC STUDIES:  Dg Chest 2 View  Result Date: 12/22/2015 CLINICAL DATA:  3-4 weeks of productive cough with posterior right-sided chest discomfort. No fever. Symptoms not responsive to antibiotics. History of pancreatic malignancy metastatic to the lungs. EXAM: CHEST  2 VIEW COMPARISON:  PA and lateral chest x-ray of Jul 02, 2015 and chest CT scan of September 29, 2015. FINDINGS: The lungs are mildly hyperinflated. The interstitial markings are coarse which are slightly more conspicuous overall especially in the lower lobes. There is stable density in the left perihilar and infrahilar regions and in the right upper lobe. The cardiac silhouette and pulmonary vascularity are normal. There is calcification in the wall of the aortic arch. The bony thorax exhibits no acute abnormality. The Port-A-Cath appliance tip projects over the midportion of the SVC. IMPRESSION: COPD. Known bilateral metastatic foci within the lungs. The  interstitial markings are slightly more conspicuous overall which may reflect superimposed acute bronchitis. There is no CHF. Given the lack of response to antibiotic therapy, a repeat chest CT scan may be useful which would allow direct  comparison with the August 2017 CT to determine if known metastatic disease has progressed. Electronically Signed   By: David  Martinique M.D.   On: 12/22/2015 09:51     PATHOLOGY:    ASSESSMENT AND PLAN:  Pancreatic cancer metastasized to lung [facility]) Stage IV pancreatic carcinoma, she is also reported to have a history of stage IV BAC of the lungs. (On further review I feel she may have had stage I BAC of the lungs).  She is currently on  liposomal irinotecan/5-FU (dose reduced).  Oncology history is up to date.  Labs today: CBC diff, CMET.  I personally reviewed and went over laboratory results with the patient.  The results are noted within this dictation.   Labs satisfy treatment parameters today.  Labs in 2 weeks, with next treatment: CBC diff, CMET, CA 19-9.  She is due for re-imaging studies.  Orders are placed for CT CAP w contrast within the next 2 weeks.  We continued to discuss goals of care and she was very frank that quality of life is most important to her.  She is educated that we must always weigh the risks and benefits of ongoing treatment and therefore, delays or stopping treatment are reasonable and will most likely be needed.  She is realistic and prepared for transition of care when indicated.  Weight is down 2 lbs.  Burtis Junes, RD is following along.  Return in 2 weeks for follow-up with labs and to review restaging test results.   ORDERS PLACED FOR THIS ENCOUNTER: Orders Placed This Encounter  Procedures  . CT Abdomen Pelvis W Contrast  . CT Chest W Contrast    MEDICATIONS PRESCRIBED THIS ENCOUNTER: No orders of the defined types were placed in this encounter.   THERAPY PLAN:  Continue treatment as planned.  Restaging tests  in ~ 2 weeks.  All questions were answered. The patient knows to call the clinic with any problems, questions or concerns. We can certainly see the patient much sooner if necessary.  Patient and plan discussed with Dr. Ancil Linsey and she is in agreement with the aforementioned.   This note is electronically signed by: Doy Mince 01/05/2016 5:04 PM

## 2016-01-05 NOTE — Progress Notes (Signed)
[place] Clinical Social Work  Clinical Social Work was referred by [place] rounding in the infusion area for assessment of psychosocial needs. Clinical Education officer, museum met with patient at [facility] briefly to introduce self, explain role of CSW,  to offer support and assess for needs.  No needs identified currently. Pt agrees to reach out as needed.     Clinical Social Work interventions: Resource education  Loren Racer, [place] Tuesdays   [phone]

## 2016-01-07 ENCOUNTER — Encounter (HOSPITAL_BASED_OUTPATIENT_CLINIC_OR_DEPARTMENT_OTHER): Payer: Medicare Other

## 2016-01-07 VITALS — BP 106/41 | HR 51 | Temp 98.3°F | Resp 16

## 2016-01-07 DIAGNOSIS — C7802 Secondary malignant neoplasm of left lung: Secondary | ICD-10-CM

## 2016-01-07 DIAGNOSIS — C259 Malignant neoplasm of pancreas, unspecified: Secondary | ICD-10-CM

## 2016-01-07 DIAGNOSIS — Z452 Encounter for adjustment and management of vascular access device: Secondary | ICD-10-CM

## 2016-01-07 DIAGNOSIS — C7800 Secondary malignant neoplasm of unspecified lung: Principal | ICD-10-CM

## 2016-01-07 MED ORDER — SODIUM CHLORIDE 0.9% FLUSH
10.0000 mL | INTRAVENOUS | Status: DC | PRN
  Administered 2016-01-07: 10 mL
  Filled 2016-01-07: qty 10

## 2016-01-07 MED ORDER — HEPARIN SOD (PORK) LOCK FLUSH 100 UNIT/ML IV SOLN
500.0000 [IU] | Freq: Once | INTRAVENOUS | Status: AC | PRN
  Administered 2016-01-07: 500 [IU]

## 2016-01-07 NOTE — Patient Instructions (Signed)
[place] at [facility] Discharge Instructions  RECOMMENDATIONS MADE BY THE CONSULTANT AND ANY TEST RESULTS WILL BE SENT TO YOUR REFERRING PHYSICIAN.  Continuous infusion pump disconnected, port flushed. Return as scheduled.  Thank you for choosing [place] at [facility] to provide your oncology and hematology care.  To afford each patient quality time with our provider, please arrive at least 15 minutes before your scheduled appointment time.   Beginning January 23rd 2017 lab work for the [facility] will be done in the  Main lab at [facility] on 1st floor. If you have a lab appointment with the [place] please come in thru the  Main Entrance and check in at the main information desk  You need to re-schedule your appointment should you arrive 10 or more minutes late.  We strive to give you quality time with our providers, and arriving late affects you and other patients whose appointments are after yours.  Also, if you no show three or more times for appointments you may be dismissed from the clinic at the providers discretion.     Again, thank you for choosing [facility].  Our hope is that these requests will decrease the amount of time that you wait before being seen by our physicians.       _____________________________________________________________  Should you have questions after your visit to [facility], please contact our office at [phone] between the hours of 8:30 a.m. and 4:30 p.m.  Voicemails left after 4:30 p.m. will not be returned until the following business day.  For prescription refill requests, have your pharmacy contact our office.         Resources For Cancer Patients and their Caregivers ? American Cancer Society: Can assist with transportation, wigs, general needs, runs Look Good Feel Better.        8313735857 ? Cancer Care: Provides financial assistance, online  support groups, medication/co-pay assistance.  1-800-813-HOPE 938-068-1504) ? [place] Assists [place] cancer patients and their families through emotional , educational and financial support.  718 276 2018 ? [place] DSS Where to apply for food stamps, Medicaid and utility assistance. 430-109-7867 ? RCATS: Transportation to medical appointments. 334 170 3232 ? Social Security Administration: May apply for disability if have a Stage IV cancer. (719)582-9816 737-694-3476 ? [employer], Disability and Transit Services: Assists with nutrition, care and transit needs. [place] Support Programs: '@10RELATIVEDAYS'$ @ > Cancer Support Group  2nd Tuesday of the month 1pm-2pm, Journey Room  > Creative Journey  3rd Tuesday of the month 1130am-1pm, Journey Room  > Look Good Feel Better  1st Wednesday of the month 10am-12 noon, Journey Room (Call [place] to register [phone])

## 2016-01-07 NOTE — Progress Notes (Signed)
Continuous infusion pump stopped/completed on arrival. Disconnected pump from patient. Flushed port per protocol and removed needle. Patient denies complaints associated with chemo. Stable and ambulatory on discharge home to self.

## 2016-01-12 ENCOUNTER — Ambulatory Visit (HOSPITAL_COMMUNITY)
Admission: RE | Admit: 2016-01-12 | Discharge: 2016-01-12 | Disposition: A | Discharge status: Home / Self Care | Payer: Medicare Other | Source: Ambulatory Visit | Attending: Oncology | Admitting: Oncology

## 2016-01-12 DIAGNOSIS — C7800 Secondary malignant neoplasm of unspecified lung: Secondary | ICD-10-CM | POA: Diagnosis not present

## 2016-01-12 DIAGNOSIS — C7951 Secondary malignant neoplasm of bone: Secondary | ICD-10-CM | POA: Insufficient documentation

## 2016-01-12 DIAGNOSIS — C259 Malignant neoplasm of pancreas, unspecified: Secondary | ICD-10-CM | POA: Diagnosis not present

## 2016-01-12 DIAGNOSIS — M47816 Spondylosis without myelopathy or radiculopathy, lumbar region: Secondary | ICD-10-CM | POA: Diagnosis not present

## 2016-01-12 DIAGNOSIS — I700 Atherosclerosis of aorta: Secondary | ICD-10-CM | POA: Diagnosis not present

## 2016-01-12 DIAGNOSIS — C3490 Malignant neoplasm of unspecified part of unspecified bronchus or lung: Secondary | ICD-10-CM | POA: Diagnosis not present

## 2016-01-12 DIAGNOSIS — I714 Abdominal aortic aneurysm, without rupture: Secondary | ICD-10-CM | POA: Diagnosis not present

## 2016-01-12 MED ORDER — IOPAMIDOL (ISOVUE-300) INJECTION 61%
75.0000 mL | Freq: Once | INTRAVENOUS | Status: AC | PRN
  Administered 2016-01-12: 75 mL via INTRAVENOUS

## 2016-01-16 ENCOUNTER — Encounter (HOSPITAL_COMMUNITY): Payer: Self-pay | Admitting: Hematology & Oncology

## 2016-01-19 ENCOUNTER — Encounter (HOSPITAL_COMMUNITY): Payer: Self-pay | Admitting: Oncology

## 2016-01-19 ENCOUNTER — Encounter (HOSPITAL_BASED_OUTPATIENT_CLINIC_OR_DEPARTMENT_OTHER): Payer: Medicare Other | Admitting: Oncology

## 2016-01-19 ENCOUNTER — Encounter (HOSPITAL_BASED_OUTPATIENT_CLINIC_OR_DEPARTMENT_OTHER): Payer: Medicare Other

## 2016-01-19 VITALS — BP 107/42 | HR 58 | Temp 98.0°F | Resp 16

## 2016-01-19 DIAGNOSIS — C7800 Secondary malignant neoplasm of unspecified lung: Secondary | ICD-10-CM

## 2016-01-19 DIAGNOSIS — D649 Anemia, unspecified: Secondary | ICD-10-CM

## 2016-01-19 DIAGNOSIS — C259 Malignant neoplasm of pancreas, unspecified: Secondary | ICD-10-CM | POA: Diagnosis not present

## 2016-01-19 DIAGNOSIS — Z5111 Encounter for antineoplastic chemotherapy: Secondary | ICD-10-CM | POA: Diagnosis not present

## 2016-01-19 DIAGNOSIS — Z95828 Presence of other vascular implants and grafts: Secondary | ICD-10-CM | POA: Diagnosis not present

## 2016-01-19 DIAGNOSIS — R197 Diarrhea, unspecified: Secondary | ICD-10-CM | POA: Diagnosis not present

## 2016-01-19 LAB — COMPREHENSIVE METABOLIC PANEL
ALT: 10 U/L — AB (ref 14–54)
AST: 16 U/L (ref 15–41)
Albumin: 3.4 g/dL — ABNORMAL LOW (ref 3.5–5.0)
Alkaline Phosphatase: 90 U/L (ref 38–126)
Anion gap: 5 (ref 5–15)
BILIRUBIN TOTAL: 0.4 mg/dL (ref 0.3–1.2)
BUN: 21 mg/dL — AB (ref 6–20)
CHLORIDE: 114 mmol/L — AB (ref 101–111)
CO2: 20 mmol/L — ABNORMAL LOW (ref 22–32)
CREATININE: 1.09 mg/dL — AB (ref 0.44–1.00)
Calcium: 9 mg/dL (ref 8.9–10.3)
GFR calc Af Amer: 55 mL/min — ABNORMAL LOW (ref >60)
GFR, EST NON AFRICAN AMERICAN: 48 mL/min — AB (ref >60)
Glucose, Bld: 73 mg/dL (ref 65–99)
Potassium: 4.3 mmol/L (ref 3.5–5.1)
Sodium: 139 mmol/L (ref 135–145)
TOTAL PROTEIN: 6.2 g/dL — AB (ref 6.5–8.1)

## 2016-01-19 LAB — CBC WITH DIFFERENTIAL/PLATELET
BASOS ABS: 0 10*3/uL (ref 0.0–0.1)
Basophils Relative: 0 %
Eosinophils Absolute: 0 10*3/uL (ref 0.0–0.7)
Eosinophils Relative: 0 %
HEMATOCRIT: 27.2 % — AB (ref 36.0–46.0)
HEMOGLOBIN: 8.8 g/dL — AB (ref 12.0–15.0)
LYMPHS PCT: 17 %
Lymphs Abs: 0.9 10*3/uL (ref 0.7–4.0)
MCH: 31.2 pg (ref 26.0–34.0)
MCHC: 32.4 g/dL (ref 30.0–36.0)
MCV: 96.5 fL (ref 78.0–100.0)
Monocytes Absolute: 0.7 10*3/uL (ref 0.1–1.0)
Monocytes Relative: 13 %
NEUTROS ABS: 3.8 10*3/uL (ref 1.7–7.7)
Neutrophils Relative %: 69 %
Platelets: 108 10*3/uL — ABNORMAL LOW (ref 150–400)
RBC: 2.82 MIL/uL — AB (ref 3.87–5.11)
RDW: 16.1 % — ABNORMAL HIGH (ref 11.5–15.5)
WBC: 5.5 10*3/uL (ref 4.0–10.5)

## 2016-01-19 LAB — IRON AND TIBC
Iron: 34 ug/dL (ref 28–170)
Saturation Ratios: 14 % (ref 10.4–31.8)
TIBC: 248 ug/dL — ABNORMAL LOW (ref 250–450)
UIBC: 214 ug/dL

## 2016-01-19 LAB — VITAMIN B12: VITAMIN B 12: 184 pg/mL (ref 180–914)

## 2016-01-19 LAB — FERRITIN: Ferritin: 171 ng/mL (ref 11–307)

## 2016-01-19 MED ORDER — SODIUM CHLORIDE 0.9 % IV SOLN
Freq: Once | INTRAVENOUS | Status: AC
  Administered 2016-01-19: 12:00:00 via INTRAVENOUS

## 2016-01-19 MED ORDER — SODIUM CHLORIDE 0.9 % IV SOLN
1500.0000 mg/m2 | INTRAVENOUS | Status: DC
  Administered 2016-01-19: 2450 mg via INTRAVENOUS
  Filled 2016-01-19: qty 49

## 2016-01-19 MED ORDER — PALONOSETRON HCL INJECTION 0.25 MG/5ML
0.2500 mg | Freq: Once | INTRAVENOUS | Status: AC
  Administered 2016-01-19: 0.25 mg via INTRAVENOUS
  Filled 2016-01-19: qty 5

## 2016-01-19 MED ORDER — PROCHLORPERAZINE MALEATE 10 MG PO TABS
10.0000 mg | ORAL_TABLET | Freq: Once | ORAL | Status: AC
  Administered 2016-01-19: 10 mg via ORAL
  Filled 2016-01-19: qty 1

## 2016-01-19 MED ORDER — IRINOTECAN HCL LIPOSOME CHEMO INJECTION 43 MG/10ML
35.0000 mg/m2 | INJECTION | Freq: Once | INTRAVENOUS | Status: AC
  Administered 2016-01-19: 55.9 mg via INTRAVENOUS
  Filled 2016-01-19: qty 3

## 2016-01-19 MED ORDER — SODIUM CHLORIDE 0.9% FLUSH: 10.0000 mL | INTRAVENOUS | Status: DC | PRN

## 2016-01-19 MED ORDER — LEUCOVORIN CALCIUM INJECTION 350 MG
400.0000 mg/m2 | Freq: Once | INTRAVENOUS | Status: AC
  Administered 2016-01-19: 652 mg via INTRAVENOUS
  Filled 2016-01-19: qty 32.6

## 2016-01-19 NOTE — Progress Notes (Signed)
Tolerated tx w/o adverse reaction.  Alert, in no distress.  VSS.  Discharged ambulatory in c/o sister.  

## 2016-01-19 NOTE — Patient Instructions (Signed)
[place] at [facility] Discharge Instructions  RECOMMENDATIONS MADE BY THE CONSULTANT AND ANY TEST RESULTS WILL BE SENT TO YOUR REFERRING PHYSICIAN.  You were seen today by Kirby Crigler PA-C. Treatment in [redacted] weeks along with labs. Return in 4 weeks for follow up.   Thank you for choosing [place] at [facility] to provide your oncology and hematology care.  To afford each patient quality time with our provider, please arrive at least 15 minutes before your scheduled appointment time.   Beginning January 23rd 2017 lab work for the [facility] will be done in the  Main lab at [facility] on 1st floor. If you have a lab appointment with the [facility] please come in thru the  Main Entrance and check in at the main information desk  You need to re-schedule your appointment should you arrive 10 or more minutes late.  We strive to give you quality time with our providers, and arriving late affects you and other patients whose appointments are after yours.  Also, if you no show three or more times for appointments you may be dismissed from the clinic at the providers discretion.     Again, thank you for choosing [facility].  Our hope is that these requests will decrease the amount of time that you wait before being seen by our physicians.       _____________________________________________________________  Should you have questions after your visit to [facility], please contact our office at [phone] between the hours of 8:30 a.m. and 4:30 p.m.  Voicemails left after 4:30 p.m. will not be returned until the following business day.  For prescription refill requests, have your pharmacy contact our office.         Resources For Cancer Patients and their Caregivers ? American Cancer Society: Can assist with transportation, wigs, general needs, runs Look Good Feel Better.        714 498 4722 ? Cancer  Care: Provides financial assistance, online support groups, medication/co-pay assistance.  1-800-813-HOPE 980-215-2194) ? [place] Assists [place] cancer patients and their families through emotional , educational and financial support.  308-637-7431 ? [place] DSS Where to apply for food stamps, Medicaid and utility assistance. 740-248-9594 ? RCATS: Transportation to medical appointments. 5673457806 ? Social Security Administration: May apply for disability if have a Stage IV cancer. 3470160951 (564)163-6524 ? [employer], Disability and Transit Services: Assists with nutrition, care and transit needs. [place] Support Programs: '@10RELATIVEDAYS'$ @ > Cancer Support Group  2nd Tuesday of the month 1pm-2pm, Journey Room  > Creative Journey  3rd Tuesday of the month 1130am-1pm, Journey Room  > Look Good Feel Better  1st Wednesday of the month 10am-12 noon, Journey Room (Call [place] to register [phone])

## 2016-01-19 NOTE — Patient Instructions (Signed)
[facility] Discharge Instructions for Patients Receiving Chemotherapy   Beginning January 23rd 2017 lab work for the [facility] will be done in the  Main lab at [facility] on 1st floor. If you have a lab appointment with the [place] please come in thru the  Main Entrance and check in at the main information desk   Today you received the following chemotherapy agents:  Irinotecan liposomal, leucovorin, and 41f  If you develop nausea and vomiting, or diarrhea that is not controlled by your medication, call the clinic.  The clinic phone number is [phone] Office hours are Monday-Friday 8:30am-5:00pm.  BELOW ARE SYMPTOMS THAT SHOULD BE REPORTED IMMEDIATELY:  *FEVER GREATER THAN 101.0 F  *CHILLS WITH OR WITHOUT FEVER  NAUSEA AND VOMITING THAT IS NOT CONTROLLED WITH YOUR NAUSEA MEDICATION  *UNUSUAL SHORTNESS OF BREATH  *UNUSUAL BRUISING OR BLEEDING  TENDERNESS IN MOUTH AND THROAT WITH OR WITHOUT PRESENCE OF ULCERS  *URINARY PROBLEMS  *BOWEL PROBLEMS  UNUSUAL RASH Items with * indicate a potential emergency and should be followed up as soon as possible. If you have an emergency after office hours please contact your primary care physician or go to the nearest emergency department.  Please call the clinic during office hours if you have any questions or concerns.   You may also contact the Patient Navigator at [phone] you have any questions or need assistance in obtaining follow up care.      Resources For Cancer Patients and their Caregivers ? American Cancer Society: Can assist with transportation, wigs, general needs, runs Look Good Feel Better.        1(706)771-0120? Cancer Care: Provides financial assistance, online support groups, medication/co-pay assistance.  1-800-813-HOPE ((331)244-8644 ? [place] cancer patients and their families through emotional , educational and financial  support.  3478-361-7727? [place] DSS Where to apply for food stamps, Medicaid and utility assistance. 3724-763-4385? RCATS: Transportation to medical appointments. 3(934) 549-5088? Social Security Administration: May apply for disability if have a Stage IV cancer. 3630-741-32041646 214 8519? [employer] Disability and Transit Services: Assists with nutrition, care and transit needs. [phone]

## 2016-01-19 NOTE — Progress Notes (Signed)
[facility], MD [address] [place] [address]  Pancreatic cancer metastasized to lung [facility]) - Plan: Cancer antigen 19-9  Anemia, unspecified type - Plan: Vitamin B12, Folate, Iron and TIBC, Ferritin  CURRENT THERAPY: Liposome Irinotecan/5FU/Leucovorin  INTERVAL HISTORY: [name] [name] 75 y.o. female returns for followup of stage IV pancreatic carcinoma, she is also reported to have a history of stage IV BAC of the lungs. (On further review I feel she may have had stage I BAC of the lungs).  She is currently on  liposomal irinotecan/5-FU.      Pancreatic cancer metastasized to lung [facility])   09/03/2008 Surgery    Whipple with Dr. Birdie Sons for T3N1 3/12 LN+ Pancreatic Adenocarcinoma      11/13/2008 - 09/12/2009 Chemotherapy    Adjuvant Gemcitabine X 6 months      08/31/2009 Surgery    Metastectomy with Dr. Arlyce Dice RUL 2 nodules, RLL (wedge resection X 2)      08/31/2009 Pathology Results    Metastatic adenocarcinoma TTF-1 negative      06/03/2010 Surgery    Left video-assisted thoracoscopic surgery, resection of left lower lobe lesion. with Dr. Arlyce Dice      06/04/2010 Pathology Results    adenocarcinoma positive for cytokeratin 7, focally positive for CDX2, with scattered staining for TTF1 cytokeratin 20. [facility] opinion felt to be lung primary although noted to stain for GI markers, not felt to be pancreatic primary. EGFR and ALK negative      03/31/2011 - 09/04/2011 Radiation Therapy    SBRT LUL 18Gy Dr. Tammi Klippel      03/15/2012 - 06/13/2012 Chemotherapy    5-FU based chemotherapy      02/13/2013 - 05/20/2013 Chemotherapy    Gemzar Abraxane for metastatic disease (given to cover both primaries at [facility])      07/15/2014 PET scan    Mild progression of pulm mets, hypermet mesenteric tumor assoc with  SMA is stable to slightly increased in degree of FDG uptake      07/23/2014 Imaging         07/30/2014 - 09/10/2014 Chemotherapy    Gemzar abraxane  stopped due to poor tolerance      04/06/2015 Procedure    L 20 French chest tube for spontaneous pneumothorax, by Dr. Tharon Aquas Trigt      04/20/2015 Pathology Results    High grade carcinoma c/w patient's known pancreatic primary (secondary opinion at [facility]) PDL-1 high expression, preserved major and minor MMR, MSI stable, [facility] ONE testing performed,       04/20/2015 Surgery    Left VATS, mini thoracotomy with stapling and oversewing of blebs, Talc pleurodesis. performed secondary to persistent air leak not improved with chest tube drainage      07/29/2015 Imaging    No signif change in appear of multifocal B cavitary pulmonary lesions, postop appearance upper abdomen, no findings of met disease to abd or pelvis,      09/29/2015 Imaging    Slight progression of multifocal cavitary pulmonary metastases, as discussed above. 2. Interval development of a lytic lesion in the antral lateral aspect of the left fifth rib where there is a nondisplaced pathologic fracture. No other definite osseous metastases are identified. 3. Prominent soft tissue adjacent to the proximal superior mesenteric artery and in the aortocaval nodal station, similar to prior examinations, likely to represent treated metastatic lymphadenopathy. 4. Epigastric ventral hernia containing several short loops of small bowel. There is a focally  dilated loop of small bowel adjacent to this, however, this is an isolated finding. No other dilatation of small bowel or colon to suggest frank bowel obstruction at this time.       10/19/2015 -  Chemotherapy    The patient had palonosetron (ALOXI) injection 0.25 mg, 0.25 mg, Intravenous,  Once, 1 of 4 cycles  leucovorin 652 mg in dextrose 5 % 250 mL infusion, 400 mg/m2 = 652 mg, Intravenous,  Once, 1 of 4 cycles  fluorouracil (ADRUCIL) 2,950 mg in sodium chloride 0.9 % 91 mL chemo infusion, 1,800 mg/m2 = 2,950 mg (100 % of original dose 1,800 mg/m2),  Intravenous, 1 Day/Dose, 1 of 4 cycles Dose modification: 1,920 mg/m2 (original dose 1,800 mg/m2, Cycle 1, Reason: Provider Judgment), 1,800 mg/m2 (original dose 1,800 mg/m2, Cycle 1, Reason: Provider Judgment)  irinotecan LIPOSOME (ONIVYDE) 55.9 mg in sodium chloride 0.9 % 500 mL chemo infusion, 35 mg/m2 = 55.9 mg (100 % of original dose 35 mg/m2), Intravenous, Once, 1 of 4 cycles Dose modification: 35 mg/m2 (original dose 35 mg/m2, Cycle 1, Reason: Provider Judgment)  for chemotherapy treatment.        01/12/2016 Imaging    CT CAP- 1. Cavitary lung metastases are stable to decreased in size. 2. Expansile mixed lytic and sclerotic bone metastasis in the anterior left fifth rib is increased in sclerosis and mildly increased in size. 3. Infiltrative soft tissue in the retroperitoneum abutting the SMA is stable. 4. No new sites of metastatic disease in the chest, abdomen or pelvis.      She denies any new complaints today.  She is tolerating treatment well.  She denies any nausea/vomiting.  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  She notes that she has felt great the last few weeks.  She reports resolution of her rib pain and reports that she does not have any thorax discomfort.  Review of Systems  Constitutional: Negative.  Negative for chills and fever.  HENT: Negative.   Eyes: Negative.   Respiratory: Negative.  Negative for cough.   Cardiovascular: Negative.  Negative for chest pain.  Gastrointestinal: Negative for constipation, diarrhea, nausea and vomiting.  Genitourinary: Negative.   Musculoskeletal: Negative.  Negative for back pain and falls.  Skin: Negative.   Neurological: Negative.   Endo/Heme/Allergies: Negative.   Psychiatric/Behavioral: Negative.     Past Medical History:  Diagnosis Date  . Abdominal wall hernia   . Aortic aneurysm (HCC)    small aortic aneurysm  . Cancer [facility]) July 2010     Pancreatic adenocarcinoma  . Cataract 2010   b/l surgery  . Cervical cancer ([place]) 1963  . COPD (chronic obstructive pulmonary disease) ([place])   . Emphysema   . Hypercholesterolemia   . Hypertension   . Pancreatic cancer metastasized to lung [facility]) 09/04/2015    Past Surgical History:  Procedure Laterality Date  . BREAST SURGERY  1988   Right breast abcess  . BUNIONECTOMY  2007  . CHOLECYSTECTOMY  July 2010   done w/ Whipple procedure  . EYE SURGERY  2009   Bilateral cataract  . STAPLING OF BLEBS Left 04/20/2015   Procedure: STAPLING OF BLEBS;  Surgeon: Ivin Poot, MD;  Location: [place];  Service: Thoracic;  Laterality: Left;  . TONSILLECTOMY  1943  . VAGINAL HYSTERECTOMY  1963   Cervical Cancer  . VIDEO ASSISTED THORACOSCOPY Left 04/20/2015   Procedure: VIDEO ASSISTED THORACOSCOPY;  Surgeon: Collier Salina  Prescott Gum, MD;  Location: [place];  Service: Thoracic;  Laterality: Left;  . WHIPPLE PROCEDURE  July 2010   pancreatic adenocarcinoma    Family History  Problem Relation Age of Onset  . Pancreatic cancer Brother   . Colon cancer Sister 86    12/2006 dx surgery and chemotherapy  . Ovarian cancer Sister 85    Ovarian ca,  surgery and chemotherapy    Social History   Social History  . Marital status: Widowed    Spouse name: N/A  . Number of children: 0  . Years of education: N/A   Occupational History  .  Retired   Social History Main Topics  . Smoking status: Former Smoker    Packs/day: 1.00    Years: 30.00    Types: Cigarettes    Quit date: 02/29/1996  . Smokeless tobacco: Never Used  . Alcohol use No  . Drug use: No  . Sexual activity: No   Other Topics Concern  . None   Social History Narrative  . None     PHYSICAL EXAMINATION  ECOG PERFORMANCE STATUS: 1 - Symptomatic but completely ambulatory  There were no vitals filed for this visit.  Vitals - 1 value per visit 36/46/8032  SYSTOLIC 122  DIASTOLIC 38  Pulse 56  Temperature 97.4  Respirations 16     GENERAL: alert, cooperative, smiling and accompanied by her sister. SKIN: skin color, texture, turgor are normal, no rashes or significant lesions HEAD: Normocephalic, No masses, lesions, tenderness or abnormalities EYES: normal, EOMI, Conjunctiva are pink and non-injected EARS: External ears normal OROPHARYNX:lips, buccal mucosa, and tongue normal and mucous membranes are moist  NECK: supple, trachea midline LYMPH:  not examined BREAST:not examined LUNGS: clear to auscultation  HEART: regular rate & rhythm ABDOMEN:abdomen soft, non-tender and normal bowel sounds BACK: Back symmetric, no curvature. EXTREMITIES:less then 2 second capillary refill, no joint deformities, effusion, or inflammation, no skin discoloration, no cyanosis  NEURO: alert & oriented x 3 with fluent speech, no focal motor/sensory deficits, gait normal  LABORATORY DATA: CBC    Component Value Date/Time   WBC 5.5 01/19/2016 1032   RBC 2.82 (L) 01/19/2016 1032   HGB 8.8 (L) 01/19/2016 1032   HCT 27.2 (L) 01/19/2016 1032   PLT 108 (L) 01/19/2016 1032   MCV 96.5 01/19/2016 1032   MCH 31.2 01/19/2016 1032   MCHC 32.4 01/19/2016 1032   RDW 16.1 (H) 01/19/2016 1032   LYMPHSABS 0.9 01/19/2016 1032   MONOABS 0.7 01/19/2016 1032   EOSABS 0.0 01/19/2016 1032   BASOSABS 0.0 01/19/2016 1032      Chemistry      Component Value Date/Time   NA 139 01/19/2016 1032   K 4.3 01/19/2016 1032   CL 114 (H) 01/19/2016 1032   CO2 20 (L) 01/19/2016 1032   BUN 21 (H) 01/19/2016 1032   CREATININE 1.09 (H) 01/19/2016 1032      Component Value Date/Time   CALCIUM 9.0 01/19/2016 1032   ALKPHOS 90 01/19/2016 1032   AST 16 01/19/2016 1032   ALT 10 (L) 01/19/2016 1032   BILITOT 0.4 01/19/2016 1032       PENDING LABS:   RADIOGRAPHIC STUDIES:  Dg Chest 2 View  Result Date: 12/22/2015 CLINICAL DATA:  3-4 weeks of productive cough with posterior right-sided chest discomfort. No fever. Symptoms not responsive to  antibiotics. History of pancreatic malignancy metastatic to the lungs. EXAM: CHEST  2 VIEW COMPARISON:  PA and lateral chest x-ray of Jul 02, 2015 and chest CT scan of September 29, 2015. FINDINGS: The lungs are mildly hyperinflated. The interstitial markings are coarse which are slightly more conspicuous overall especially in the lower lobes. There is stable density in the left perihilar and infrahilar regions and in the right upper lobe. The cardiac silhouette and pulmonary vascularity are normal. There is calcification in the wall of the aortic arch. The bony thorax exhibits no acute abnormality. The Port-A-Cath appliance tip projects over the midportion of the SVC. IMPRESSION: COPD. Known bilateral metastatic foci within the lungs. The interstitial markings are slightly more conspicuous overall which may reflect superimposed acute bronchitis. There is no CHF. Given the lack of response to antibiotic therapy, a repeat chest CT scan may be useful which would allow direct comparison with the August 2017 CT to determine if known metastatic disease has progressed. Electronically Signed   By: David  Martinique M.D.   On: 12/22/2015 09:51   Ct Chest W Contrast  Result Date: 01/12/2016 CLINICAL DATA:  75 year old female with stage IV pancreatic cancer initially diagnosed in 2010 status post Whipple procedure, status post wedge resection x2 for right pulmonary metastases in July 2011, status post left lower lobe wedge resection able 2012 for adenocarcinoma, status post radiation therapy to the left upper lobe completed July 2013, status post left VATS with talc pleurodesis 04/20/2015 due to persistent spontaneous pneumothorax, with ongoing chemotherapy, presenting for restaging. EXAM: CT CHEST, ABDOMEN, AND PELVIS WITH CONTRAST TECHNIQUE: Multidetector CT imaging of the chest, abdomen and pelvis was performed following the standard protocol during bolus administration of intravenous contrast. CONTRAST:  12m ISOVUE-300  IOPAMIDOL (ISOVUE-300) INJECTION 61% COMPARISON:  09/29/2015 CT chest, abdomen and pelvis. FINDINGS: CT CHEST FINDINGS Cardiovascular: Normal heart size. No significant pericardial fluid/thickening. Left main, left anterior descending, left circumflex and right coronary atherosclerosis. Right internal jugular MediPort terminates in the lower third of the superior vena cava. Atherosclerotic nonaneurysmal thoracic aorta. Normal caliber pulmonary arteries. No central pulmonary emboli. Mediastinum/Nodes: Dominant hypodense posterior right thyroid lobe 1.7 cm nodule, stable. Unremarkable esophagus. No pathologically enlarged axillary, mediastinal or hilar lymph nodes. Lungs/Pleura: No pneumothorax. No pleural effusion. Stable patchy pleural thickening and calcification in the left pleural space from left talc pleurodesis. Moderate centrilobular emphysema. Stable postsurgical changes from right upper lobe, right lower lobe and left lower lobe wedge resections. No acute consolidative airspace disease. Multiple irregular cavitary pulmonary nodules and masses in the mid to lower lungs bilaterally appear stable to the decreased in size. For example in the posterior right lower lobe, a 5.0 x 3.4 cm cavitary mass (series 3/ image 116), previously 6.9 x 4.0 cm, decreased. Anterior right lower lobe 2.7 x 1.4 cm irregular cavitary nodule (series 3/ image 108), previously 2.8 x 1.4 cm, not appreciably changed. Anterior left lower lobe partially cavitary irregular 1.8 x 1.5 cm nodule (series 3/ image 110), previously 2.0 x 1.9 cm, mildly decreased. No appreciable new or enlarging pulmonary nodules. Stable irregular bandlike radiation fibrosis in the left upper lobe. Musculoskeletal: Stable nonunion of a mildly displaced chronic anterior left fourth rib fracture. Stable healed deformity in the anterolateral left sixth rib. Increased sclerosis associated with the mixed lytic and sclerotic expansile bone lesion in the anterior left  fifth rib with associated nondisplaced pathologic fracture, with increased bony expansion (14 mm diameter on series 2/image 36, previously 12 mm diameter). Moderate thoracic spondylosis. No new thoracic osseous lesions. CT ABDOMEN PELVIS FINDINGS Hepatobiliary: Asymmetric enlargement of the left liver lobe with diffusely mildly irregular liver  surface, suggesting cirrhosis, unchanged. No liver mass. Cholecystectomy. Mild diffuse intrahepatic biliary ductal dilatation with extensive pneumobilia, unchanged. Stable appearance of the choledochojejunostomy. Pancreas: Status post resection of the pancreatic head and neck. Stable appearance of the relatively atrophic remnant pancreatic body and tail. Stable gas within the mildly dilated pancreatic bile duct. Pancreaticojejunostomy appears stable. No pancreatic or anastomotic mass. Spleen: Normal size. No mass. Adrenals/Urinary Tract: Stable 1.7 cm right adrenal nodule, non hypermetabolic on prior PET-CT studies and stable back to the 10/28/2008, consistent with a benign adenoma. No left adrenal nodule. No hydronephrosis. Simple 2.0 cm anterior lower left renal cyst. Normal bladder. Stomach/Bowel: Stable large midline ventral upper abdominal wall hernia containing a portion of the distal stomach, gastrojejunostomy and multiple proximal small bowel loops, with no evidence of significant gastric distention, small bowel dilatation, pneumatosis or significant bowel wall thickening. Normal caliber small bowel with no small bowel wall thickening. Normal appendix. Normal large bowel with no diverticulosis, large bowel wall thickening or pericolonic fat stranding. Vascular/Lymphatic: Atherosclerotic abdominal aorta. Stable aneurysmal dilatation of the infrarenal abdominal aorta, maximum diameter 3.9 cm. Patent portal, splenic and renal veins. Infiltrative soft tissue abutting the portosplenic venous confluence, SMA, IVC and abdominal aorta measures 2.5 x 1.8 cm (series 2/ image  61), previously 2.5 x 1.8 cm, stable. Otherwise no pathologically enlarged abdominopelvic lymph nodes. Reproductive: Status post hysterectomy, with no abnormal findings at the vaginal cuff. No adnexal mass. Other: No pneumoperitoneum, ascites or focal fluid collection. Musculoskeletal: No aggressive appearing focal osseous lesions. Mild lumbar spondylosis. IMPRESSION: 1. Cavitary lung metastases are stable to decreased in size. 2. Expansile mixed lytic and sclerotic bone metastasis in the anterior left fifth rib is increased in sclerosis and mildly increased in size. 3. Infiltrative soft tissue in the retroperitoneum abutting the SMA is stable. 4. No new sites of metastatic disease in the chest, abdomen or pelvis. 5. Aortic atherosclerosis. Stable 3.9 cm infrarenal abdominal aortic aneurysm. 6. Stable postsurgical and numerous additional chronic changes as described. Electronically Signed   By: Ilona Sorrel M.D.   On: 01/12/2016 16:23   Ct Abdomen Pelvis W Contrast  Result Date: 01/12/2016 CLINICAL DATA:  75 year old female with stage IV pancreatic cancer initially diagnosed in 2010 status post Whipple procedure, status post wedge resection x2 for right pulmonary metastases in July 2011, status post left lower lobe wedge resection able 2012 for adenocarcinoma, status post radiation therapy to the left upper lobe completed July 2013, status post left VATS with talc pleurodesis 04/20/2015 due to persistent spontaneous pneumothorax, with ongoing chemotherapy, presenting for restaging. EXAM: CT CHEST, ABDOMEN, AND PELVIS WITH CONTRAST TECHNIQUE: Multidetector CT imaging of the chest, abdomen and pelvis was performed following the standard protocol during bolus administration of intravenous contrast. CONTRAST:  68m ISOVUE-300 IOPAMIDOL (ISOVUE-300) INJECTION 61% COMPARISON:  09/29/2015 CT chest, abdomen and pelvis. FINDINGS: CT CHEST FINDINGS Cardiovascular: Normal heart size. No significant pericardial  fluid/thickening. Left main, left anterior descending, left circumflex and right coronary atherosclerosis. Right internal jugular MediPort terminates in the lower third of the superior vena cava. Atherosclerotic nonaneurysmal thoracic aorta. Normal caliber pulmonary arteries. No central pulmonary emboli. Mediastinum/Nodes: Dominant hypodense posterior right thyroid lobe 1.7 cm nodule, stable. Unremarkable esophagus. No pathologically enlarged axillary, mediastinal or hilar lymph nodes. Lungs/Pleura: No pneumothorax. No pleural effusion. Stable patchy pleural thickening and calcification in the left pleural space from left talc pleurodesis. Moderate centrilobular emphysema. Stable postsurgical changes from right upper lobe, right lower lobe and left lower lobe wedge resections. No acute consolidative  airspace disease. Multiple irregular cavitary pulmonary nodules and masses in the mid to lower lungs bilaterally appear stable to the decreased in size. For example in the posterior right lower lobe, a 5.0 x 3.4 cm cavitary mass (series 3/ image 116), previously 6.9 x 4.0 cm, decreased. Anterior right lower lobe 2.7 x 1.4 cm irregular cavitary nodule (series 3/ image 108), previously 2.8 x 1.4 cm, not appreciably changed. Anterior left lower lobe partially cavitary irregular 1.8 x 1.5 cm nodule (series 3/ image 110), previously 2.0 x 1.9 cm, mildly decreased. No appreciable new or enlarging pulmonary nodules. Stable irregular bandlike radiation fibrosis in the left upper lobe. Musculoskeletal: Stable nonunion of a mildly displaced chronic anterior left fourth rib fracture. Stable healed deformity in the anterolateral left sixth rib. Increased sclerosis associated with the mixed lytic and sclerotic expansile bone lesion in the anterior left fifth rib with associated nondisplaced pathologic fracture, with increased bony expansion (14 mm diameter on series 2/image 36, previously 12 mm diameter). Moderate thoracic  spondylosis. No new thoracic osseous lesions. CT ABDOMEN PELVIS FINDINGS Hepatobiliary: Asymmetric enlargement of the left liver lobe with diffusely mildly irregular liver surface, suggesting cirrhosis, unchanged. No liver mass. Cholecystectomy. Mild diffuse intrahepatic biliary ductal dilatation with extensive pneumobilia, unchanged. Stable appearance of the choledochojejunostomy. Pancreas: Status post resection of the pancreatic head and neck. Stable appearance of the relatively atrophic remnant pancreatic body and tail. Stable gas within the mildly dilated pancreatic bile duct. Pancreaticojejunostomy appears stable. No pancreatic or anastomotic mass. Spleen: Normal size. No mass. Adrenals/Urinary Tract: Stable 1.7 cm right adrenal nodule, non hypermetabolic on prior PET-CT studies and stable back to the 10/28/2008, consistent with a benign adenoma. No left adrenal nodule. No hydronephrosis. Simple 2.0 cm anterior lower left renal cyst. Normal bladder. Stomach/Bowel: Stable large midline ventral upper abdominal wall hernia containing a portion of the distal stomach, gastrojejunostomy and multiple proximal small bowel loops, with no evidence of significant gastric distention, small bowel dilatation, pneumatosis or significant bowel wall thickening. Normal caliber small bowel with no small bowel wall thickening. Normal appendix. Normal large bowel with no diverticulosis, large bowel wall thickening or pericolonic fat stranding. Vascular/Lymphatic: Atherosclerotic abdominal aorta. Stable aneurysmal dilatation of the infrarenal abdominal aorta, maximum diameter 3.9 cm. Patent portal, splenic and renal veins. Infiltrative soft tissue abutting the portosplenic venous confluence, SMA, IVC and abdominal aorta measures 2.5 x 1.8 cm (series 2/ image 61), previously 2.5 x 1.8 cm, stable. Otherwise no pathologically enlarged abdominopelvic lymph nodes. Reproductive: Status post hysterectomy, with no abnormal findings at the  vaginal cuff. No adnexal mass. Other: No pneumoperitoneum, ascites or focal fluid collection. Musculoskeletal: No aggressive appearing focal osseous lesions. Mild lumbar spondylosis. IMPRESSION: 1. Cavitary lung metastases are stable to decreased in size. 2. Expansile mixed lytic and sclerotic bone metastasis in the anterior left fifth rib is increased in sclerosis and mildly increased in size. 3. Infiltrative soft tissue in the retroperitoneum abutting the SMA is stable. 4. No new sites of metastatic disease in the chest, abdomen or pelvis. 5. Aortic atherosclerosis. Stable 3.9 cm infrarenal abdominal aortic aneurysm. 6. Stable postsurgical and numerous additional chronic changes as described. Electronically Signed   By: Ilona Sorrel M.D.   On: 01/12/2016 16:23     PATHOLOGY:    ASSESSMENT AND PLAN:  Pancreatic cancer metastasized to lung [facility]) Stage IV pancreatic carcinoma, she is also reported to have a history of stage IV BAC of the lungs. (On further review I feel she may have had stage  I BAC of the lungs).  She is currently on  liposomal irinotecan/5-FU (dose reduced).  Oncology history is up to date.  Labs today: CBC diff, CMET.  I personally reviewed and went over laboratory results with the patient.  The results are noted within this dictation.   Labs satisfy treatment parameters today.  Anemia noted today.  Anemia panel is ordered.  Labs in 2 weeks, with next treatment: CBC diff, CMET, CA 19-9.  I personally reviewed and went over radiographic studies with the patient.  The results are noted within this dictation.  CT imaging shows an overall improvement in her disease burden.  We continued to discuss goals of care and she was very frank that quality of life is most important to her.  She is educated that we must always weigh the risks and benefits of ongoing treatment and therefore, delays or stopping treatment are reasonable and will most likely be needed.  She is realistic and  prepared for transition of care when indicated.  Return in 2 weeks for treatment and follow-up.  ORDERS PLACED FOR THIS ENCOUNTER: Orders Placed This Encounter  Procedures  . Vitamin B12  . Folate  . Iron and TIBC  . Ferritin  . Cancer antigen 19-9    MEDICATIONS PRESCRIBED THIS ENCOUNTER: No orders of the defined types were placed in this encounter.   THERAPY PLAN:  Continue treatment as planned.    All questions were answered. The patient knows to call the clinic with any problems, questions or concerns. We can certainly see the patient much sooner if necessary.  Patient and plan discussed with Dr. Ancil Linsey and she is in agreement with the aforementioned.   This note is electronically signed by: Doy Mince 01/19/2016 2:28 PM

## 2016-01-19 NOTE — Assessment & Plan Note (Addendum)
Stage IV pancreatic carcinoma, she is also reported to have a history of stage IV BAC of the lungs. (On further review I feel she may have had stage I BAC of the lungs).  She is currently on  liposomal irinotecan/5-FU (dose reduced).  Oncology history is up to date.  Labs today: CBC diff, CMET.  I personally reviewed and went over laboratory results with the patient.  The results are noted within this dictation.   Labs satisfy treatment parameters today.  Anemia noted today.  Anemia panel is ordered.  Labs in 2 weeks, with next treatment: CBC diff, CMET, CA 19-9.  I personally reviewed and went over radiographic studies with the patient.  The results are noted within this dictation.  CT imaging shows an overall improvement in her disease burden.  We continued to discuss goals of care and she was very frank that quality of life is most important to her.  She is educated that we must always weigh the risks and benefits of ongoing treatment and therefore, delays or stopping treatment are reasonable and will most likely be needed.  She is realistic and prepared for transition of care when indicated.  Return in 2 weeks for treatment and follow-up.

## 2016-01-20 ENCOUNTER — Other Ambulatory Visit (HOSPITAL_COMMUNITY): Payer: Self-pay | Admitting: Oncology

## 2016-01-20 DIAGNOSIS — E538 Deficiency of other specified B group vitamins: Secondary | ICD-10-CM

## 2016-01-20 HISTORY — DX: Deficiency of other specified B group vitamins: E53.8

## 2016-01-20 LAB — FOLATE: Folate: >100 ng/mL (ref >5.9)

## 2016-01-21 ENCOUNTER — Other Ambulatory Visit (HOSPITAL_COMMUNITY): Payer: Self-pay

## 2016-01-21 ENCOUNTER — Encounter (HOSPITAL_BASED_OUTPATIENT_CLINIC_OR_DEPARTMENT_OTHER): Payer: Medicare Other

## 2016-01-21 VITALS — BP 130/54 | HR 57 | Temp 97.3°F | Resp 18

## 2016-01-21 DIAGNOSIS — C7800 Secondary malignant neoplasm of unspecified lung: Secondary | ICD-10-CM | POA: Diagnosis not present

## 2016-01-21 DIAGNOSIS — Z452 Encounter for adjustment and management of vascular access device: Secondary | ICD-10-CM | POA: Diagnosis not present

## 2016-01-21 DIAGNOSIS — E538 Deficiency of other specified B group vitamins: Secondary | ICD-10-CM

## 2016-01-21 DIAGNOSIS — Z95828 Presence of other vascular implants and grafts: Secondary | ICD-10-CM | POA: Diagnosis not present

## 2016-01-21 DIAGNOSIS — C259 Malignant neoplasm of pancreas, unspecified: Secondary | ICD-10-CM | POA: Diagnosis not present

## 2016-01-21 DIAGNOSIS — R197 Diarrhea, unspecified: Secondary | ICD-10-CM | POA: Diagnosis not present

## 2016-01-21 MED ORDER — HEPARIN SOD (PORK) LOCK FLUSH 100 UNIT/ML IV SOLN
500.0000 [IU] | Freq: Once | INTRAVENOUS | Status: AC | PRN
  Administered 2016-01-21: 500 [IU]

## 2016-01-21 MED ORDER — SODIUM CHLORIDE 0.9% FLUSH
10.0000 mL | INTRAVENOUS | Status: DC | PRN
  Administered 2016-01-21: 10 mL
  Filled 2016-01-21: qty 10

## 2016-01-21 NOTE — Progress Notes (Signed)
[name] [name] presents to have home infusion pump d/c'd and for port-a-cath deaccess with flush. Portacath located right chest wall accessed with  H 20 needle.  Good blood return present. Portacath flushed with NS 53m and 500U/531mHeparin, and needle removed intact.  Procedure tolerated well and without incident.

## 2016-01-21 NOTE — Patient Instructions (Signed)
[place] at [facility] Discharge Instructions  RECOMMENDATIONS MADE BY THE CONSULTANT AND ANY TEST RESULTS WILL BE SENT TO YOUR REFERRING PHYSICIAN.  Home infusion pump disconnected and port flushed. Return as scheduled for treatment.  Thank you for choosing [place] at [facility] to provide your oncology and hematology care.  To afford each patient quality time with our provider, please arrive at least 15 minutes before your scheduled appointment time.   Beginning January 23rd 2017 lab work for the [facility] will be done in the  Main lab at [facility] on 1st floor. If you have a lab appointment with the [facility] please come in thru the  Main Entrance and check in at the main information desk  You need to re-schedule your appointment should you arrive 10 or more minutes late.  We strive to give you quality time with our providers, and arriving late affects you and other patients whose appointments are after yours.  Also, if you no show three or more times for appointments you may be dismissed from the clinic at the providers discretion.     Again, thank you for choosing [facility].  Our hope is that these requests will decrease the amount of time that you wait before being seen by our physicians.       _____________________________________________________________  Should you have questions after your visit to [facility], please contact our office at [phone] between the hours of 8:30 a.m. and 4:30 p.m.  Voicemails left after 4:30 p.m. will not be returned until the following business day.  For prescription refill requests, have your pharmacy contact our office.         Resources For Cancer Patients and their Caregivers ? American Cancer Society: Can assist with transportation, wigs, general needs, runs Look Good Feel Better.        319 529 9942 ? Cancer Care: Provides financial assistance,  online support groups, medication/co-pay assistance.  1-800-813-HOPE 334-832-6718) ? [facility] Assists [place] cancer patients and their families through emotional , educational and financial support.  7734824958 ? [place] DSS Where to apply for food stamps, Medicaid and utility assistance. (769)668-0342 ? [employer]: Transportation to medical appointments. 909-440-7387 ? Social Security Administration: May apply for disability if have a Stage IV cancer. (828) 506-7963 (681)472-5005 ? [employer], Disability and Transit Services: Assists with nutrition, care and transit needs. [place] Support Programs: '@10RELATIVEDAYS'$ @ > Cancer Support Group  2nd Tuesday of the month 1pm-2pm, Journey Room  > Creative Journey  3rd Tuesday of the month 1130am-1pm, Journey Room  > Look Good Feel Better  1st Wednesday of the month 10am-12 noon, Journey Room (Call [place] to register [phone])

## 2016-01-22 LAB — INTRINSIC FACTOR ANTIBODIES: INTRINSIC FACTOR: 0.9 [AU]/ml (ref 0.0–1.1)

## 2016-01-22 LAB — ANTI-PARIETAL ANTIBODY: Parietal Cell Antibody-IgG: 3.5 Units (ref 0.0–20.0)

## 2016-01-22 LAB — HOMOCYSTEINE: Homocysteine: 15.7 umol/L — ABNORMAL HIGH (ref 0.0–15.0)

## 2016-01-25 LAB — METHYLMALONIC ACID, SERUM: Methylmalonic Acid, Quantitative: 153 nmol/L (ref 0–378)

## 2016-01-26 ENCOUNTER — Encounter (HOSPITAL_COMMUNITY): Payer: Medicare Other | Attending: Adult Health

## 2016-01-26 VITALS — BP 131/63 | HR 53 | Temp 97.3°F | Resp 18

## 2016-01-26 DIAGNOSIS — Z95828 Presence of other vascular implants and grafts: Secondary | ICD-10-CM | POA: Insufficient documentation

## 2016-01-26 DIAGNOSIS — C7800 Secondary malignant neoplasm of unspecified lung: Secondary | ICD-10-CM | POA: Insufficient documentation

## 2016-01-26 DIAGNOSIS — C259 Malignant neoplasm of pancreas, unspecified: Secondary | ICD-10-CM | POA: Insufficient documentation

## 2016-01-26 DIAGNOSIS — D649 Anemia, unspecified: Secondary | ICD-10-CM

## 2016-01-26 DIAGNOSIS — R197 Diarrhea, unspecified: Secondary | ICD-10-CM | POA: Insufficient documentation

## 2016-01-26 DIAGNOSIS — E538 Deficiency of other specified B group vitamins: Secondary | ICD-10-CM

## 2016-01-26 MED ORDER — CYANOCOBALAMIN 1000 MCG/ML IJ SOLN
1000.0000 ug | Freq: Once | INTRAMUSCULAR | Status: AC
  Administered 2016-01-26: 1000 ug via INTRAMUSCULAR

## 2016-01-26 MED ORDER — CYANOCOBALAMIN 1000 MCG/ML IJ SOLN
INTRAMUSCULAR | Status: AC
  Filled 2016-01-26: qty 1

## 2016-01-26 NOTE — Progress Notes (Signed)
[name] [name] presents today for injection per MD orders. B12 1000 mcg administered IM in right Upper Arm. Administration without incident. Patient tolerated well.

## 2016-01-26 NOTE — Patient Instructions (Signed)
[facility] at [facility] Discharge Instructions  RECOMMENDATIONS MADE BY THE CONSULTANT AND ANY TEST RESULTS WILL BE SENT TO YOUR REFERRING PHYSICIAN.  Vitamin B12 1000 mcg injection given as ordered. Return as scheduled.  Thank you for choosing [facility] at [facility] to provide your oncology and hematology care.  To afford each patient quality time with our provider, please arrive at least 15 minutes before your scheduled appointment time.   Beginning January 23rd 2017 lab work for the Cancer Center will be done in the  Main lab at [facility] on 1st floor. If you have a lab appointment with the Cancer Center please come in thru the  Main Entrance and check in at the main information desk  You need to re-schedule your appointment should you arrive 10 or more minutes late.  We strive to give you quality time with our providers, and arriving late affects you and other patients whose appointments are after yours.  Also, if you no show three or more times for appointments you may be dismissed from the clinic at the providers discretion.     Again, thank you for choosing [facility].  Our hope is that these requests will decrease the amount of time that you wait before being seen by our physicians.       _____________________________________________________________  Should you have questions after your visit to [facility], please contact our office at [phone] between the hours of 8:30 a.m. and 4:30 p.m.  Voicemails left after 4:30 p.m. will not be returned until the following business day.  For prescription refill requests, have your pharmacy contact our office.         Resources For Cancer Patients and their Caregivers ? American Cancer Society: Can assist with transportation, wigs, general needs, runs Look Good Feel Better.        1-888-227-6333 ? Cancer Care: Provides financial assistance, online support  groups, medication/co-pay assistance.  1-800-813-HOPE (4673) ? [facility] Assists [place] cancer patients and their families through emotional , educational and financial support.  336-427-4357 ? [place] DSS Where to apply for food stamps, Medicaid and utility assistance. 336-342-1394 ? [employer]: Transportation to medical appointments. 336-347-2287 ? Social Security Administration: May apply for disability if have a Stage IV cancer. 336-342-7796 1-800-772-1213 ? [place] [facility], Disability and Transit Services: Assists with nutrition, care and transit needs. 336-349-2343  Cancer Center Support Programs: @10RELATIVEDAYS@ > Cancer Support Group  2nd Tuesday of the month 1pm-2pm, Journey Room  > Creative Journey  3rd Tuesday of the month 1130am-1pm, Journey Room  > Look Good Feel Better  1st Wednesday of the month 10am-12 noon, Journey Room (Call American Cancer [employer] to register 1-800-395-5775)   

## 2016-02-01 NOTE — Progress Notes (Signed)
[facility], [place] [address]  Pancreatic cancer metastasized to lung [facility]) - Plan: CBC with Differential, Comprehensive metabolic panel, Cancer antigen 19-9  Low serum vitamin B12  Goals of care, counseling/discussion  CURRENT THERAPY: Liposome Irinotecan/5FU/Leucovorin  INTERVAL HISTORY: [name] [name] 75 y.o. female returns for followup of stage IV pancreatic carcinoma, she is also reported to have a history of stage IV BAC of the lungs. (On further review I feel she may have had stage I BAC of the lungs).  She is currently on  liposomal irinotecan/5-FU.      Pancreatic cancer metastasized to lung [facility])   09/03/2008 Surgery    Whipple with Dr. Birdie Sons for T3N1 3/12 LN+ Pancreatic Adenocarcinoma      11/13/2008 - 09/12/2009 Chemotherapy    Adjuvant Gemcitabine X 6 months      08/31/2009 Surgery    Metastectomy with Dr. Arlyce Dice RUL 2 nodules, RLL (wedge resection X 2)      08/31/2009 Pathology Results    Metastatic adenocarcinoma TTF-1 negative      06/03/2010 Surgery    Left video-assisted thoracoscopic surgery, resection of left lower lobe lesion. with Dr. Arlyce Dice      06/04/2010 Pathology Results    adenocarcinoma positive for cytokeratin 7, focally positive for CDX2, with scattered staining for TTF1 cytokeratin 20. [facility] opinion felt to be lung primary although noted to stain for GI markers, not felt to be pancreatic primary. EGFR and ALK negative      03/31/2011 - 09/04/2011 Radiation Therapy    SBRT LUL 18Gy Dr. Tammi Klippel      03/15/2012 - 06/13/2012 Chemotherapy    5-FU based chemotherapy      02/13/2013 - 05/20/2013 Chemotherapy    Gemzar Abraxane for metastatic disease (given to cover both primaries at [facility])      07/15/2014 PET scan    Mild progression of pulm mets, hypermet mesenteric tumor assoc with  SMA is stable to slightly increased in degree of FDG uptake      07/23/2014 Imaging         07/30/2014 - 09/10/2014  Chemotherapy    Gemzar abraxane stopped due to poor tolerance      04/06/2015 Procedure    L 20 French chest tube for spontaneous pneumothorax, by Dr. Tharon Aquas Trigt      04/20/2015 Pathology Results    High grade carcinoma c/w patient's known pancreatic primary (secondary opinion at [facility]) PDL-1 high expression, preserved major and minor MMR, MSI stable, [facility] ONE testing performed,       04/20/2015 Surgery    Left VATS, mini thoracotomy with stapling and oversewing of blebs, Talc pleurodesis. performed secondary to persistent air leak not improved with chest tube drainage      07/29/2015 Imaging    No signif change in appear of multifocal B cavitary pulmonary lesions, postop appearance upper abdomen, no findings of met disease to abd or pelvis,      09/29/2015 Imaging    Slight progression of multifocal cavitary pulmonary metastases, as discussed above. 2. Interval development of a lytic lesion in the antral lateral aspect of the left fifth rib where there is a nondisplaced pathologic fracture. No other definite osseous metastases are identified. 3. Prominent soft tissue adjacent to the proximal superior mesenteric artery and in the aortocaval nodal station, similar to prior examinations, likely to represent treated metastatic lymphadenopathy. 4. Epigastric ventral hernia containing several short loops of small bowel. There  is a focally dilated loop of small bowel adjacent to this, however, this is an isolated finding. No other dilatation of small bowel or colon to suggest frank bowel obstruction at this time.       10/19/2015 -  Chemotherapy    The patient had palonosetron (ALOXI) injection 0.25 mg, 0.25 mg, Intravenous,  Once, 1 of 4 cycles  leucovorin 652 mg in dextrose 5 % 250 mL infusion, 400 mg/m2 = 652 mg, Intravenous,  Once, 1 of 4 cycles  fluorouracil (ADRUCIL) 2,950 mg in sodium chloride 0.9 % 91 mL chemo infusion, 1,800 mg/m2 = 2,950 mg (100 %  of original dose 1,800 mg/m2), Intravenous, 1 Day/Dose, 1 of 4 cycles Dose modification: 1,920 mg/m2 (original dose 1,800 mg/m2, Cycle 1, Reason: Provider Judgment), 1,800 mg/m2 (original dose 1,800 mg/m2, Cycle 1, Reason: Provider Judgment)  irinotecan LIPOSOME (ONIVYDE) 55.9 mg in sodium chloride 0.9 % 500 mL chemo infusion, 35 mg/m2 = 55.9 mg (100 % of original dose 35 mg/m2), Intravenous, Once, 1 of 4 cycles Dose modification: 35 mg/m2 (original dose 35 mg/m2, Cycle 1, Reason: Provider Judgment)  for chemotherapy treatment.        01/12/2016 Imaging    CT CAP- 1. Cavitary lung metastases are stable to decreased in size. 2. Expansile mixed lytic and sclerotic bone metastasis in the anterior left fifth rib is increased in sclerosis and mildly increased in size. 3. Infiltrative soft tissue in the retroperitoneum abutting the SMA is stable. 4. No new sites of metastatic disease in the chest, abdomen or pelvis.       She reports some pedal erythema.  She notes that it changes from day to day without any significant change in her edema.  She denies any pain with this as well.  She reports infrequent blood in the toilet bowel post BM.  She notes it on her toilet paper.  She denies any pain or discomfort.  She reports that it occurs occasionally.  Additionally, she notes vaginal soreness ~ 48-72 hours post-treatment.  She denies any obvious lesions and denies vaginal discharge.  She notes that the soreness is on her labias.  Review of Systems  Constitutional: Negative.  Negative for chills, fever and weight loss.  HENT: Negative.   Eyes: Negative.   Respiratory: Negative.  Negative for cough and sputum production.   Cardiovascular: Positive for leg swelling (B/L). Negative for chest pain.  Gastrointestinal: Negative.  Negative for constipation, diarrhea, nausea and vomiting.  Genitourinary: Negative.        Vaginal soreness  Musculoskeletal: Negative.  Negative for falls.  Skin:  Negative.  Negative for rash.  Neurological: Negative.  Negative for weakness.  Endo/Heme/Allergies: Negative.   Psychiatric/Behavioral: Negative.     Past Medical History:  Diagnosis Date  . Abdominal wall hernia   . Aortic aneurysm (HCC)    small aortic aneurysm  . Cancer [facility]) July 2010   Pancreatic adenocarcinoma  . Cataract 2010   b/l surgery  . Cervical cancer ([place]) 1963  . COPD (chronic obstructive pulmonary disease) ([place])   . Emphysema   . Goals of care, counseling/discussion 02/02/2016  . Hypercholesterolemia   . Hypertension   . Pancreatic cancer metastasized to lung [facility]) 09/04/2015    Past Surgical History:  Procedure Laterality Date  . BREAST SURGERY  1988   Right breast abcess  . BUNIONECTOMY  2007  . CHOLECYSTECTOMY  July 2010   done w/ Whipple procedure  . EYE SURGERY  2009   Bilateral cataract  .  STAPLING OF BLEBS Left 04/20/2015   Procedure: STAPLING OF BLEBS;  Surgeon: Ivin Poot, MD;  Location: [place];  Service: Thoracic;  Laterality: Left;  . TONSILLECTOMY  1943  . VAGINAL HYSTERECTOMY  1963   Cervical Cancer  . VIDEO ASSISTED THORACOSCOPY Left 04/20/2015   Procedure: VIDEO ASSISTED THORACOSCOPY;  Surgeon: Ivin Poot, MD;  Location: [place];  Service: Thoracic;  Laterality: Left;  . WHIPPLE PROCEDURE  July 2010   pancreatic adenocarcinoma    Family History  Problem Relation Age of Onset  . Pancreatic cancer Brother   . Colon cancer Sister 26    12/2006 dx surgery and chemotherapy  . Ovarian cancer Sister 16    Ovarian ca,  surgery and chemotherapy    Social History   Social History  . Marital status: Widowed    Spouse name: N/A  . Number of children: 0  . Years of education: N/A   Occupational History  .  Retired   Social History Main Topics  . Smoking status: Former Smoker    Packs/day: 1.00    Years: 30.00    Types: Cigarettes    Quit date: 02/29/1996  . Smokeless tobacco: Never Used  . Alcohol use No  . Drug use: No  .  Sexual activity: No   Other Topics Concern  . None   Social History Narrative  . None     PHYSICAL EXAMINATION  ECOG PERFORMANCE STATUS: 1 - Symptomatic but completely ambulatory  Vitals:   02/02/16 0858  BP: (!) 133/56  Pulse: 63  Resp: 18  Temp: 97.6 F (36.4 C)     GENERAL: alert, cooperative, smiling and accompanied by her sister. SKIN: skin color, texture, turgor are normal, no rashes or significant lesions HEAD: Normocephalic, No masses, lesions, tenderness or abnormalities EYES: normal, EOMI, Conjunctiva are pink and non-injected EARS: External ears normal OROPHARYNX:lips, buccal mucosa, and tongue normal and mucous membranes are moist  NECK: supple, trachea midline LYMPH:  not examined BREAST:not examined LUNGS: clear to auscultation  HEART: regular rate & rhythm ABDOMEN:abdomen soft, non-tender and normal bowel sounds BACK: Back symmetric, no curvature. EXTREMITIES:less then 2 second capillary refill, no joint deformities, effusion, or inflammation, no skin discoloration, no cyanosis. Telangiectasias of pedal skin NEURO: alert & oriented x 3 with fluent speech, no focal motor/sensory deficits, gait normal  LABORATORY DATA: CBC    Component Value Date/Time   WBC 5.5 01/19/2016 1032   RBC 2.82 (L) 01/19/2016 1032   HGB 8.8 (L) 01/19/2016 1032   HCT 27.2 (L) 01/19/2016 1032   PLT 108 (L) 01/19/2016 1032   MCV 96.5 01/19/2016 1032   MCH 31.2 01/19/2016 1032   MCHC 32.4 01/19/2016 1032   RDW 16.1 (H) 01/19/2016 1032   LYMPHSABS 0.9 01/19/2016 1032   MONOABS 0.7 01/19/2016 1032   EOSABS 0.0 01/19/2016 1032   BASOSABS 0.0 01/19/2016 1032      Chemistry      Component Value Date/Time   NA 139 01/19/2016 1032   K 4.3 01/19/2016 1032   CL 114 (H) 01/19/2016 1032   CO2 20 (L) 01/19/2016 1032   BUN 21 (H) 01/19/2016 1032   CREATININE 1.09 (H) 01/19/2016 1032      Component Value Date/Time   CALCIUM 9.0 01/19/2016 1032   ALKPHOS 90 01/19/2016 1032    AST 16 01/19/2016 1032   ALT 10 (L) 01/19/2016 1032   BILITOT 0.4 01/19/2016 1032       PENDING LABS:   RADIOGRAPHIC  STUDIES:  Ct Chest W Contrast  Result Date: 01/12/2016 CLINICAL DATA:  75 year old female with stage IV pancreatic cancer initially diagnosed in 2010 status post Whipple procedure, status post wedge resection x2 for right pulmonary metastases in July 2011, status post left lower lobe wedge resection able 2012 for adenocarcinoma, status post radiation therapy to the left upper lobe completed July 2013, status post left VATS with talc pleurodesis 04/20/2015 due to persistent spontaneous pneumothorax, with ongoing chemotherapy, presenting for restaging. EXAM: CT CHEST, ABDOMEN, AND PELVIS WITH CONTRAST TECHNIQUE: Multidetector CT imaging of the chest, abdomen and pelvis was performed following the standard protocol during bolus administration of intravenous contrast. CONTRAST:  87m ISOVUE-300 IOPAMIDOL (ISOVUE-300) INJECTION 61% COMPARISON:  09/29/2015 CT chest, abdomen and pelvis. FINDINGS: CT CHEST FINDINGS Cardiovascular: Normal heart size. No significant pericardial fluid/thickening. Left main, left anterior descending, left circumflex and right coronary atherosclerosis. Right internal jugular MediPort terminates in the lower third of the superior vena cava. Atherosclerotic nonaneurysmal thoracic aorta. Normal caliber pulmonary arteries. No central pulmonary emboli. Mediastinum/Nodes: Dominant hypodense posterior right thyroid lobe 1.7 cm nodule, stable. Unremarkable esophagus. No pathologically enlarged axillary, mediastinal or hilar lymph nodes. Lungs/Pleura: No pneumothorax. No pleural effusion. Stable patchy pleural thickening and calcification in the left pleural space from left talc pleurodesis. Moderate centrilobular emphysema. Stable postsurgical changes from right upper lobe, right lower lobe and left lower lobe wedge resections. No acute consolidative airspace disease.  Multiple irregular cavitary pulmonary nodules and masses in the mid to lower lungs bilaterally appear stable to the decreased in size. For example in the posterior right lower lobe, a 5.0 x 3.4 cm cavitary mass (series 3/ image 116), previously 6.9 x 4.0 cm, decreased. Anterior right lower lobe 2.7 x 1.4 cm irregular cavitary nodule (series 3/ image 108), previously 2.8 x 1.4 cm, not appreciably changed. Anterior left lower lobe partially cavitary irregular 1.8 x 1.5 cm nodule (series 3/ image 110), previously 2.0 x 1.9 cm, mildly decreased. No appreciable new or enlarging pulmonary nodules. Stable irregular bandlike radiation fibrosis in the left upper lobe. Musculoskeletal: Stable nonunion of a mildly displaced chronic anterior left fourth rib fracture. Stable healed deformity in the anterolateral left sixth rib. Increased sclerosis associated with the mixed lytic and sclerotic expansile bone lesion in the anterior left fifth rib with associated nondisplaced pathologic fracture, with increased bony expansion (14 mm diameter on series 2/image 36, previously 12 mm diameter). Moderate thoracic spondylosis. No new thoracic osseous lesions. CT ABDOMEN PELVIS FINDINGS Hepatobiliary: Asymmetric enlargement of the left liver lobe with diffusely mildly irregular liver surface, suggesting cirrhosis, unchanged. No liver mass. Cholecystectomy. Mild diffuse intrahepatic biliary ductal dilatation with extensive pneumobilia, unchanged. Stable appearance of the choledochojejunostomy. Pancreas: Status post resection of the pancreatic head and neck. Stable appearance of the relatively atrophic remnant pancreatic body and tail. Stable gas within the mildly dilated pancreatic bile duct. Pancreaticojejunostomy appears stable. No pancreatic or anastomotic mass. Spleen: Normal size. No mass. Adrenals/Urinary Tract: Stable 1.7 cm right adrenal nodule, non hypermetabolic on prior PET-CT studies and stable back to the 10/28/2008,  consistent with a benign adenoma. No left adrenal nodule. No hydronephrosis. Simple 2.0 cm anterior lower left renal cyst. Normal bladder. Stomach/Bowel: Stable large midline ventral upper abdominal wall hernia containing a portion of the distal stomach, gastrojejunostomy and multiple proximal small bowel loops, with no evidence of significant gastric distention, small bowel dilatation, pneumatosis or significant bowel wall thickening. Normal caliber small bowel with no small bowel wall thickening. Normal appendix. Normal large  bowel with no diverticulosis, large bowel wall thickening or pericolonic fat stranding. Vascular/Lymphatic: Atherosclerotic abdominal aorta. Stable aneurysmal dilatation of the infrarenal abdominal aorta, maximum diameter 3.9 cm. Patent portal, splenic and renal veins. Infiltrative soft tissue abutting the portosplenic venous confluence, SMA, IVC and abdominal aorta measures 2.5 x 1.8 cm (series 2/ image 61), previously 2.5 x 1.8 cm, stable. Otherwise no pathologically enlarged abdominopelvic lymph nodes. Reproductive: Status post hysterectomy, with no abnormal findings at the vaginal cuff. No adnexal mass. Other: No pneumoperitoneum, ascites or focal fluid collection. Musculoskeletal: No aggressive appearing focal osseous lesions. Mild lumbar spondylosis. IMPRESSION: 1. Cavitary lung metastases are stable to decreased in size. 2. Expansile mixed lytic and sclerotic bone metastasis in the anterior left fifth rib is increased in sclerosis and mildly increased in size. 3. Infiltrative soft tissue in the retroperitoneum abutting the SMA is stable. 4. No new sites of metastatic disease in the chest, abdomen or pelvis. 5. Aortic atherosclerosis. Stable 3.9 cm infrarenal abdominal aortic aneurysm. 6. Stable postsurgical and numerous additional chronic changes as described. Electronically Signed   By: Ilona Sorrel M.D.   On: 01/12/2016 16:23   Ct Abdomen Pelvis W Contrast  Result Date:  01/12/2016 CLINICAL DATA:  75 year old female with stage IV pancreatic cancer initially diagnosed in 2010 status post Whipple procedure, status post wedge resection x2 for right pulmonary metastases in July 2011, status post left lower lobe wedge resection able 2012 for adenocarcinoma, status post radiation therapy to the left upper lobe completed July 2013, status post left VATS with talc pleurodesis 04/20/2015 due to persistent spontaneous pneumothorax, with ongoing chemotherapy, presenting for restaging. EXAM: CT CHEST, ABDOMEN, AND PELVIS WITH CONTRAST TECHNIQUE: Multidetector CT imaging of the chest, abdomen and pelvis was performed following the standard protocol during bolus administration of intravenous contrast. CONTRAST:  24m ISOVUE-300 IOPAMIDOL (ISOVUE-300) INJECTION 61% COMPARISON:  09/29/2015 CT chest, abdomen and pelvis. FINDINGS: CT CHEST FINDINGS Cardiovascular: Normal heart size. No significant pericardial fluid/thickening. Left main, left anterior descending, left circumflex and right coronary atherosclerosis. Right internal jugular MediPort terminates in the lower third of the superior vena cava. Atherosclerotic nonaneurysmal thoracic aorta. Normal caliber pulmonary arteries. No central pulmonary emboli. Mediastinum/Nodes: Dominant hypodense posterior right thyroid lobe 1.7 cm nodule, stable. Unremarkable esophagus. No pathologically enlarged axillary, mediastinal or hilar lymph nodes. Lungs/Pleura: No pneumothorax. No pleural effusion. Stable patchy pleural thickening and calcification in the left pleural space from left talc pleurodesis. Moderate centrilobular emphysema. Stable postsurgical changes from right upper lobe, right lower lobe and left lower lobe wedge resections. No acute consolidative airspace disease. Multiple irregular cavitary pulmonary nodules and masses in the mid to lower lungs bilaterally appear stable to the decreased in size. For example in the posterior right lower  lobe, a 5.0 x 3.4 cm cavitary mass (series 3/ image 116), previously 6.9 x 4.0 cm, decreased. Anterior right lower lobe 2.7 x 1.4 cm irregular cavitary nodule (series 3/ image 108), previously 2.8 x 1.4 cm, not appreciably changed. Anterior left lower lobe partially cavitary irregular 1.8 x 1.5 cm nodule (series 3/ image 110), previously 2.0 x 1.9 cm, mildly decreased. No appreciable new or enlarging pulmonary nodules. Stable irregular bandlike radiation fibrosis in the left upper lobe. Musculoskeletal: Stable nonunion of a mildly displaced chronic anterior left fourth rib fracture. Stable healed deformity in the anterolateral left sixth rib. Increased sclerosis associated with the mixed lytic and sclerotic expansile bone lesion in the anterior left fifth rib with associated nondisplaced pathologic fracture, with increased bony expansion (  14 mm diameter on series 2/image 36, previously 12 mm diameter). Moderate thoracic spondylosis. No new thoracic osseous lesions. CT ABDOMEN PELVIS FINDINGS Hepatobiliary: Asymmetric enlargement of the left liver lobe with diffusely mildly irregular liver surface, suggesting cirrhosis, unchanged. No liver mass. Cholecystectomy. Mild diffuse intrahepatic biliary ductal dilatation with extensive pneumobilia, unchanged. Stable appearance of the choledochojejunostomy. Pancreas: Status post resection of the pancreatic head and neck. Stable appearance of the relatively atrophic remnant pancreatic body and tail. Stable gas within the mildly dilated pancreatic bile duct. Pancreaticojejunostomy appears stable. No pancreatic or anastomotic mass. Spleen: Normal size. No mass. Adrenals/Urinary Tract: Stable 1.7 cm right adrenal nodule, non hypermetabolic on prior PET-CT studies and stable back to the 10/28/2008, consistent with a benign adenoma. No left adrenal nodule. No hydronephrosis. Simple 2.0 cm anterior lower left renal cyst. Normal bladder. Stomach/Bowel: Stable large midline ventral  upper abdominal wall hernia containing a portion of the distal stomach, gastrojejunostomy and multiple proximal small bowel loops, with no evidence of significant gastric distention, small bowel dilatation, pneumatosis or significant bowel wall thickening. Normal caliber small bowel with no small bowel wall thickening. Normal appendix. Normal large bowel with no diverticulosis, large bowel wall thickening or pericolonic fat stranding. Vascular/Lymphatic: Atherosclerotic abdominal aorta. Stable aneurysmal dilatation of the infrarenal abdominal aorta, maximum diameter 3.9 cm. Patent portal, splenic and renal veins. Infiltrative soft tissue abutting the portosplenic venous confluence, SMA, IVC and abdominal aorta measures 2.5 x 1.8 cm (series 2/ image 61), previously 2.5 x 1.8 cm, stable. Otherwise no pathologically enlarged abdominopelvic lymph nodes. Reproductive: Status post hysterectomy, with no abnormal findings at the vaginal cuff. No adnexal mass. Other: No pneumoperitoneum, ascites or focal fluid collection. Musculoskeletal: No aggressive appearing focal osseous lesions. Mild lumbar spondylosis. IMPRESSION: 1. Cavitary lung metastases are stable to decreased in size. 2. Expansile mixed lytic and sclerotic bone metastasis in the anterior left fifth rib is increased in sclerosis and mildly increased in size. 3. Infiltrative soft tissue in the retroperitoneum abutting the SMA is stable. 4. No new sites of metastatic disease in the chest, abdomen or pelvis. 5. Aortic atherosclerosis. Stable 3.9 cm infrarenal abdominal aortic aneurysm. 6. Stable postsurgical and numerous additional chronic changes as described. Electronically Signed   By: Ilona Sorrel M.D.   On: 01/12/2016 16:23     PATHOLOGY:    ASSESSMENT AND PLAN:  Pancreatic cancer metastasized to lung [facility]) Stage IV pancreatic carcinoma, she is also reported to have a history of stage IV BAC of the lungs. (On further review I feel she may have had  stage I BAC of the lungs).  She is currently on  liposomal irinotecan/5-FU (dose reduced).  Oncology history is updated.  Labs today: CBC diff, CMET, CA 19-9.  I personally reviewed and went over laboratory results with the patient.  The results are noted within this dictation.     Ongoing anemia is likely chemotherapy induced in the setting of an unimpressive anemia work-up.  If HGB is below baseline today, will start Aranesp at 500 mcg every 3 weeks.    Pre-treatment labs in 2 weeks: CBC diff, CMET.  I suspect she has hemorrhoidal rectal bleeding based upon discussion.  She is offered a referral to Dr. Laural Golden.  At this time, she declines.  I suspect 5FU induced soreness of vaginal labia.  She has already been reduced.  Symptom seems self-limiting only.  5FU is significantly dose reduced in past.  No further dose reduction indicated at this time.  We continued to  discuss goals of care and she was very frank that quality of life is most important to her.  She is educated that we must always weigh the risks and benefits of ongoing treatment and therefore, delays or stopping treatment are reasonable and will most likely be needed.  She is realistic and prepared for transition of care when indicated.  Return as scheduled for treatment and follow-up appointment.  Low serum vitamin B12 Low-normal B12 with negative antibody testing.    Currently on B12 IM weekly x 4.  Following completion of B12 weekly x 4 we can consider ongoing IM B12 monthly or transition to PO B12 replacement therapy.  I will defer this to the patient.  ORDERS PLACED FOR THIS ENCOUNTER: No orders of the defined types were placed in this encounter.   MEDICATIONS PRESCRIBED THIS ENCOUNTER: Meds ordered this encounter  Medications  . amoxicillin (AMOXIL) 500 MG capsule    THERAPY PLAN:  Continue treatment as planned.    All questions were answered. The patient knows to call the clinic with any problems, questions or  concerns. We can certainly see the patient much sooner if necessary.  Patient and plan discussed with Dr. Ancil Linsey and she is in agreement with the aforementioned.   This note is electronically signed by: Doy Mince 02/02/2016 9:32 AM

## 2016-02-01 NOTE — Assessment & Plan Note (Addendum)
Low-normal B12 with negative antibody testing.    Currently on B12 IM weekly x 4.  Following completion of B12 weekly x 4 we can consider ongoing IM B12 monthly or transition to PO B12 replacement therapy.  I will defer this to the patient.

## 2016-02-01 NOTE — Assessment & Plan Note (Addendum)
Stage IV pancreatic carcinoma, she is also reported to have a history of stage IV BAC of the lungs. (On further review I feel she may have had stage I BAC of the lungs).  She is currently on  liposomal irinotecan/5-FU (dose reduced).  Oncology history is updated.  Labs today: CBC diff, CMET, CA 19-9.  I personally reviewed and went over laboratory results with the patient.  The results are noted within this dictation.     Ongoing anemia is likely chemotherapy induced in the setting of an unimpressive anemia work-up.  If HGB is below baseline today, will start Aranesp at 500 mcg every 3 weeks.  HGB today os 8.4 g/dL.  I discussed the addition of Aranesp to her treatment plan and she is agreeable.    Supportive therapy plan built for Aranesp 500 mcg every 3 weeks.  We will start next week with her next B12 injection.  Pre-treatment labs in 2 weeks: CBC diff, CMET.  I suspect she has hemorrhoidal rectal bleeding based upon discussion.  She is offered a referral to Dr. Laural Golden.  At this time, she declines.  I suspect 5FU induced soreness of vaginal labia.  She has already been reduced.  Symptom seems self-limiting only.  5FU is significantly dose reduced in past.  No further dose reduction indicated at this time.  We continued to discuss goals of care and she was very frank that quality of life is most important to her.  She is educated that we must always weigh the risks and benefits of ongoing treatment and therefore, delays or stopping treatment are reasonable and will most likely be needed.  She is realistic and prepared for transition of care when indicated.  Return as scheduled for treatment and follow-up appointment.

## 2016-02-02 ENCOUNTER — Encounter (HOSPITAL_COMMUNITY): Payer: Self-pay | Admitting: Oncology

## 2016-02-02 ENCOUNTER — Encounter (HOSPITAL_BASED_OUTPATIENT_CLINIC_OR_DEPARTMENT_OTHER): Payer: Medicare Other

## 2016-02-02 ENCOUNTER — Encounter (HOSPITAL_BASED_OUTPATIENT_CLINIC_OR_DEPARTMENT_OTHER): Payer: Medicare Other | Admitting: Oncology

## 2016-02-02 VITALS — BP 153/51 | HR 59 | Temp 97.8°F | Resp 16

## 2016-02-02 DIAGNOSIS — N7689 Other specified inflammation of vagina and vulva: Secondary | ICD-10-CM | POA: Diagnosis not present

## 2016-02-02 DIAGNOSIS — C259 Malignant neoplasm of pancreas, unspecified: Secondary | ICD-10-CM

## 2016-02-02 DIAGNOSIS — D649 Anemia, unspecified: Secondary | ICD-10-CM

## 2016-02-02 DIAGNOSIS — C7800 Secondary malignant neoplasm of unspecified lung: Secondary | ICD-10-CM

## 2016-02-02 DIAGNOSIS — Z5111 Encounter for antineoplastic chemotherapy: Secondary | ICD-10-CM

## 2016-02-02 DIAGNOSIS — E538 Deficiency of other specified B group vitamins: Secondary | ICD-10-CM | POA: Diagnosis not present

## 2016-02-02 DIAGNOSIS — R197 Diarrhea, unspecified: Secondary | ICD-10-CM | POA: Diagnosis not present

## 2016-02-02 DIAGNOSIS — Z7189 Other specified counseling: Secondary | ICD-10-CM

## 2016-02-02 DIAGNOSIS — Z95828 Presence of other vascular implants and grafts: Secondary | ICD-10-CM | POA: Diagnosis not present

## 2016-02-02 HISTORY — DX: Other specified counseling: Z71.89

## 2016-02-02 LAB — CBC WITH DIFFERENTIAL/PLATELET
BASOS ABS: 0 10*3/uL (ref 0.0–0.1)
BASOS PCT: 0 %
Eosinophils Absolute: 0 10*3/uL (ref 0.0–0.7)
Eosinophils Relative: 1 %
HEMATOCRIT: 27.2 % — AB (ref 36.0–46.0)
HEMOGLOBIN: 8.4 g/dL — AB (ref 12.0–15.0)
Lymphocytes Relative: 19 %
Lymphs Abs: 1 10*3/uL (ref 0.7–4.0)
MCH: 30.3 pg (ref 26.0–34.0)
MCHC: 30.9 g/dL (ref 30.0–36.0)
MCV: 98.2 fL (ref 78.0–100.0)
Monocytes Absolute: 0.6 10*3/uL (ref 0.1–1.0)
Monocytes Relative: 11 %
NEUTROS ABS: 3.6 10*3/uL (ref 1.7–7.7)
NEUTROS PCT: 69 %
Platelets: 113 10*3/uL — ABNORMAL LOW (ref 150–400)
RBC: 2.77 MIL/uL — ABNORMAL LOW (ref 3.87–5.11)
RDW: 15.5 % (ref 11.5–15.5)
WBC: 5.2 10*3/uL (ref 4.0–10.5)

## 2016-02-02 LAB — COMPREHENSIVE METABOLIC PANEL
ALBUMIN: 3.6 g/dL (ref 3.5–5.0)
ALK PHOS: 100 U/L (ref 38–126)
ALT: 13 U/L — AB (ref 14–54)
AST: 16 U/L (ref 15–41)
Anion gap: 7 (ref 5–15)
BILIRUBIN TOTAL: 0.6 mg/dL (ref 0.3–1.2)
BUN: 23 mg/dL — AB (ref 6–20)
CALCIUM: 9 mg/dL (ref 8.9–10.3)
CO2: 18 mmol/L — ABNORMAL LOW (ref 22–32)
Chloride: 116 mmol/L — ABNORMAL HIGH (ref 101–111)
Creatinine, Ser: 1.15 mg/dL — ABNORMAL HIGH (ref 0.44–1.00)
GFR calc Af Amer: 52 mL/min — ABNORMAL LOW (ref >60)
GFR calc non Af Amer: 45 mL/min — ABNORMAL LOW (ref >60)
GLUCOSE: 101 mg/dL — AB (ref 65–99)
Potassium: 3.9 mmol/L (ref 3.5–5.1)
Sodium: 141 mmol/L (ref 135–145)
TOTAL PROTEIN: 6.2 g/dL — AB (ref 6.5–8.1)

## 2016-02-02 MED ORDER — SODIUM CHLORIDE 0.9% FLUSH: 10.0000 mL | INTRAVENOUS | Status: DC | PRN

## 2016-02-02 MED ORDER — SODIUM CHLORIDE 0.9 % IV SOLN
Freq: Once | INTRAVENOUS | Status: AC
  Administered 2016-02-02: 11:00:00 via INTRAVENOUS

## 2016-02-02 MED ORDER — CYANOCOBALAMIN 1000 MCG/ML IJ SOLN
INTRAMUSCULAR | Status: AC
  Filled 2016-02-02: qty 1

## 2016-02-02 MED ORDER — PALONOSETRON HCL INJECTION 0.25 MG/5ML
INTRAVENOUS | Status: AC
  Filled 2016-02-02: qty 5

## 2016-02-02 MED ORDER — DEXTROSE 5 % IV SOLN
400.0000 mg/m2 | Freq: Once | INTRAVENOUS | Status: AC
  Administered 2016-02-02: 652 mg via INTRAVENOUS
  Filled 2016-02-02: qty 32.6

## 2016-02-02 MED ORDER — PALONOSETRON HCL INJECTION 0.25 MG/5ML
0.2500 mg | Freq: Once | INTRAVENOUS | Status: AC
  Administered 2016-02-02: 0.25 mg via INTRAVENOUS

## 2016-02-02 MED ORDER — PROCHLORPERAZINE MALEATE 10 MG PO TABS
ORAL_TABLET | ORAL | Status: AC
  Filled 2016-02-02: qty 1

## 2016-02-02 MED ORDER — SODIUM CHLORIDE 0.9 % IV SOLN
35.0000 mg/m2 | Freq: Once | INTRAVENOUS | Status: AC
  Administered 2016-02-02: 55.9 mg via INTRAVENOUS
  Filled 2016-02-02: qty 13

## 2016-02-02 MED ORDER — SODIUM CHLORIDE 0.9 % IV SOLN
1500.0000 mg/m2 | INTRAVENOUS | Status: DC
  Administered 2016-02-02: 2450 mg via INTRAVENOUS
  Filled 2016-02-02: qty 49

## 2016-02-02 MED ORDER — PROCHLORPERAZINE MALEATE 10 MG PO TABS
10.0000 mg | ORAL_TABLET | Freq: Once | ORAL | Status: AC
  Administered 2016-02-02: 10 mg via ORAL

## 2016-02-02 MED ORDER — CYANOCOBALAMIN 1000 MCG/ML IJ SOLN
1000.0000 ug | Freq: Once | INTRAMUSCULAR | Status: AC
  Administered 2016-02-02: 1000 ug via INTRAMUSCULAR

## 2016-02-02 NOTE — Progress Notes (Signed)
Tolerated tx w/o adverse reaction.  Alert, in no distress.  VSS.  B12 administration as ordered, see MAR for details.  Discharged ambulatory in c/o family.

## 2016-02-02 NOTE — Patient Instructions (Addendum)
[place] at [facility] Discharge Instructions  RECOMMENDATIONS MADE BY THE CONSULTANT AND ANY TEST RESULTS WILL BE SENT TO YOUR REFERRING PHYSICIAN.  You were seen today by Kirby Crigler PA-C. Labs today and treatment if labs are good. B12 injection today and weekly x 2 more. Return as scheduled.  Thank you for choosing [place] at [facility] to provide your oncology and hematology care.  To afford each patient quality time with our provider, please arrive at least 15 minutes before your scheduled appointment time.   Beginning January 23rd 2017 lab work for the [facility] will be done in the  Main lab at [facility] on 1st floor. If you have a lab appointment with the [place] please come in thru the  Main Entrance and check in at the main information desk  You need to re-schedule your appointment should you arrive 10 or more minutes late.  We strive to give you quality time with our providers, and arriving late affects you and other patients whose appointments are after yours.  Also, if you no show three or more times for appointments you may be dismissed from the clinic at the providers discretion.     Again, thank you for choosing [facility].  Our hope is that these requests will decrease the amount of time that you wait before being seen by our physicians.       _____________________________________________________________  Should you have questions after your visit to [facility], please contact our office at [phone] between the hours of 8:30 a.m. and 4:30 p.m.  Voicemails left after 4:30 p.m. will not be returned until the following business day.  For prescription refill requests, have your pharmacy contact our office.         Resources For Cancer Patients and their Caregivers ? American Cancer Society: Can assist with transportation, wigs, general needs, runs Look Good Feel Better.         ([phone] ? Cancer Care: Provides financial assistance, online support groups, medication/co-pay assistance.  1-800-813-HOPE 218-610-3028) ? [place] Assists [place] cancer patients and their families through emotional , educational and financial support.  754-134-7212 ? [place] DSS Where to apply for food stamps, Medicaid and utility assistance. ([phone] [phone] ? RCATS: Transportation to medical appointments. 938-434-2226 ? Social Security Administration: May apply for disability if have a Stage IV cancer. 312-812-8067 616-689-5238 ? [employer], Disability and Transit Services: Assists with nutrition, care and transit needs. [place] Support Programs: '@10RELATIVEDAYS'$ @ > Cancer Support Group  2nd Tuesday of the month 1pm-2pm, Journey Room  > Creative Journey  3rd Tuesday of the month 1130am-1pm, Journey Room  > Look Good Feel Better  1st Wednesday of the month 10am-12 noon, Journey Room (Call [place] to register [phone])

## 2016-02-02 NOTE — Patient Instructions (Signed)
[facility] Discharge Instructions for Patients Receiving Chemotherapy   Beginning January 23rd 2017 lab work for the [facility] will be done in the  Main lab at [facility] on 1st floor. If you have a lab appointment with the [place] please come in thru the  Main Entrance and check in at the main information desk   Today you received the following chemotherapy agents:  Irinotecan, leucovorin, and 5FU.  If you develop nausea and vomiting, or diarrhea that is not controlled by your medication, call the clinic.  The clinic phone number is [phone]. Office hours are Monday-Friday 8:30am-5:00pm.  BELOW ARE SYMPTOMS THAT SHOULD BE REPORTED IMMEDIATELY:  *FEVER GREATER THAN 101.0 F  *CHILLS WITH OR WITHOUT FEVER  NAUSEA AND VOMITING THAT IS NOT CONTROLLED WITH YOUR NAUSEA MEDICATION  *UNUSUAL SHORTNESS OF BREATH  *UNUSUAL BRUISING OR BLEEDING  TENDERNESS IN MOUTH AND THROAT WITH OR WITHOUT PRESENCE OF ULCERS  *URINARY PROBLEMS  *BOWEL PROBLEMS  UNUSUAL RASH Items with * indicate a potential emergency and should be followed up as soon as possible. If you have an emergency after office hours please contact your primary care physician or go to the nearest emergency department.  Please call the clinic during office hours if you have any questions or concerns.   You may also contact the Patient Navigator at [phone] should you have any questions or need assistance in obtaining follow up care.      Resources For Cancer Patients and their Caregivers ? American Cancer Society: Can assist with transportation, wigs, general needs, runs Look Good Feel Better.        479-066-3379 ? Cancer Care: Provides financial assistance, online support groups, medication/co-pay assistance.  1-800-813-HOPE (308)809-8538) ? [place] Assists [place] cancer patients and their families through emotional , educational and financial support.   ([phone] [phone] ? [place] DSS Where to apply for food stamps, Medicaid and utility assistance. (319)066-5040 ? RCATS: Transportation to medical appointments. [phone] ? Social Security Administration: May apply for disability if have a Stage IV cancer. 315-565-8186 [phone] ? [employer], Disability and Transit Services: Assists with nutrition, care and transit needs. [phone]

## 2016-02-03 LAB — CANCER ANTIGEN 19-9: CA 19-9: 238 U/mL — ABNORMAL HIGH (ref 0–35)

## 2016-02-04 ENCOUNTER — Encounter (HOSPITAL_BASED_OUTPATIENT_CLINIC_OR_DEPARTMENT_OTHER): Payer: Medicare Other

## 2016-02-04 ENCOUNTER — Encounter (HOSPITAL_COMMUNITY): Payer: Self-pay

## 2016-02-04 VITALS — BP 118/57 | HR 67 | Temp 97.7°F | Resp 16

## 2016-02-04 DIAGNOSIS — C7800 Secondary malignant neoplasm of unspecified lung: Secondary | ICD-10-CM

## 2016-02-04 DIAGNOSIS — C259 Malignant neoplasm of pancreas, unspecified: Secondary | ICD-10-CM

## 2016-02-04 DIAGNOSIS — Z452 Encounter for adjustment and management of vascular access device: Secondary | ICD-10-CM

## 2016-02-04 MED ORDER — SODIUM CHLORIDE 0.9% FLUSH
10.0000 mL | INTRAVENOUS | Status: DC | PRN
  Administered 2016-02-04: 10 mL
  Filled 2016-02-04: qty 10

## 2016-02-04 MED ORDER — HEPARIN SOD (PORK) LOCK FLUSH 100 UNIT/ML IV SOLN
500.0000 [IU] | Freq: Once | INTRAVENOUS | Status: AC | PRN
  Administered 2016-02-04: 500 [IU]

## 2016-02-04 MED ORDER — HEPARIN SOD (PORK) LOCK FLUSH 100 UNIT/ML IV SOLN
INTRAVENOUS | Status: AC
  Filled 2016-02-04: qty 5

## 2016-02-04 NOTE — Patient Instructions (Signed)
[place] at [facility] Discharge Instructions  RECOMMENDATIONS MADE BY THE CONSULTANT AND ANY TEST RESULTS WILL BE SENT TO YOUR REFERRING PHYSICIAN.  Home infusion pump removal and port flush today. Return as scheduled for B12 injections. Return as scheduled for chemotherapy.   Thank you for choosing [place] at [facility] to provide your oncology and hematology care.  To afford each patient quality time with our provider, please arrive at least 15 minutes before your scheduled appointment time.   Beginning January 23rd 2017 lab work for the [facility] will be done in the  Main lab at [facility] on 1st floor. If you have a lab appointment with the [place] please come in thru the  Main Entrance and check in at the main information desk  You need to re-schedule your appointment should you arrive 10 or more minutes late.  We strive to give you quality time with our providers, and arriving late affects you and other patients whose appointments are after yours.  Also, if you no show three or more times for appointments you may be dismissed from the clinic at the providers discretion.     Again, thank you for choosing [facility].  Our hope is that these requests will decrease the amount of time that you wait before being seen by our physicians.       _____________________________________________________________  Should you have questions after your visit to [facility], please contact our office at [phone] between the hours of 8:30 a.m. and 4:30 p.m.  Voicemails left after 4:30 p.m. will not be returned until the following business day.  For prescription refill requests, have your pharmacy contact our office.         Resources For Cancer Patients and their Caregivers ? American Cancer Society: Can assist with transportation, wigs, general needs, runs Look Good Feel Better.         [phone] ? Cancer Care: Provides financial assistance, online support groups, medication/co-pay assistance.  1-800-813-HOPE (780)292-2255) ? [place] Assists [place] cancer patients and their families through emotional , educational and financial support.  (513)595-2527 ? [place] DSS Where to apply for food stamps, Medicaid and utility assistance. [phone] 235-2675 ? RCATS: Transportation to medical appointments. 603-871-1982 ? Social Security Administration: May apply for disability if have a Stage IV cancer. [phone] ? [employer], Disability and Transit Services: Assists with nutrition, care and transit needs. [place] Support Programs: '@10RELATIVEDAYS'$ @ > Cancer Support Group  2nd Tuesday of the month 1pm-2pm, Journey Room  > Creative Journey  3rd Tuesday of the month 1130am-1pm, Journey Room  > Look Good Feel Better  1st Wednesday of the month 10am-12 noon, Journey Room (Call [place] to register [phone])

## 2016-02-04 NOTE — Progress Notes (Signed)
[name] [name] presents to have home infusion pump d/c'd and for port-a-cath deaccess with flush.  Portacath located right chest wall accessed with  H 20 needle.  Good blood return present. Portacath flushed with NS and 500U/80m Heparin, and needle removed intact.  Procedure tolerated well and without incident.

## 2016-02-09 ENCOUNTER — Encounter (HOSPITAL_BASED_OUTPATIENT_CLINIC_OR_DEPARTMENT_OTHER): Payer: Medicare Other

## 2016-02-09 ENCOUNTER — Encounter (HOSPITAL_COMMUNITY): Payer: Self-pay

## 2016-02-09 VITALS — BP 131/82 | HR 61 | Temp 97.6°F | Resp 16

## 2016-02-09 DIAGNOSIS — R7989 Other specified abnormal findings of blood chemistry: Secondary | ICD-10-CM

## 2016-02-09 DIAGNOSIS — E538 Deficiency of other specified B group vitamins: Secondary | ICD-10-CM

## 2016-02-09 MED ORDER — CYANOCOBALAMIN 1000 MCG/ML IJ SOLN
1000.0000 ug | Freq: Once | INTRAMUSCULAR | Status: AC
  Administered 2016-02-09: 1000 ug via INTRAMUSCULAR

## 2016-02-09 MED ORDER — CYANOCOBALAMIN 1000 MCG/ML IJ SOLN
INTRAMUSCULAR | Status: AC
  Filled 2016-02-09: qty 1

## 2016-02-09 NOTE — Patient Instructions (Signed)
[place] at [facility] Discharge Instructions  RECOMMENDATIONS MADE BY THE CONSULTANT AND ANY TEST RESULTS WILL BE SENT TO YOUR REFERRING PHYSICIAN.  B12 injection  Thank you for choosing [place] at [facility] to provide your oncology and hematology care.  To afford each patient quality time with our provider, please arrive at least 15 minutes before your scheduled appointment time.   Beginning January 23rd 2017 lab work for the [facility] will be done in the  Main lab at [facility] on 1st floor. If you have a lab appointment with the [place] please come in thru the  Main Entrance and check in at the main information desk  You need to re-schedule your appointment should you arrive 10 or more minutes late.  We strive to give you quality time with our providers, and arriving late affects you and other patients whose appointments are after yours.  Also, if you no show three or more times for appointments you may be dismissed from the clinic at the providers discretion.     Again, thank you for choosing [facility].  Our hope is that these requests will decrease the amount of time that you wait before being seen by our physicians.       _____________________________________________________________  Should you have questions after your visit to [facility], please contact our office at [phone] between the hours of 8:30 a.m. and 4:30 p.m.  Voicemails left after 4:30 p.m. will not be returned until the following business day.  For prescription refill requests, have your pharmacy contact our office.         Resources For Cancer Patients and their Caregivers ? American Cancer Society: Can assist with transportation, wigs, general needs, runs Look Good Feel Better.        208-875-3336 ? Cancer Care: Provides financial assistance, online support groups, medication/co-pay assistance.  1-800-813-HOPE  903-539-9338) ? [place] Assists [place] cancer patients and their families through emotional , educational and financial support.  [phone] ? [place] DSS Where to apply for food stamps, Medicaid and utility assistance. 970-661-1962 ? RCATS: Transportation to medical appointments. 563-315-2313 ? Social Security Administration: May apply for disability if have a Stage IV cancer. 386-838-2823 [phone] ? [employer], Disability and Transit Services: Assists with nutrition, care and transit needs. [place] Support Programs: '@10RELATIVEDAYS'$ @ > Cancer Support Group  2nd Tuesday of the month 1pm-2pm, Journey Room  > Creative Journey  3rd Tuesday of the month 1130am-1pm, Journey Room  > Look Good Feel Better  1st Wednesday of the month 10am-12 noon, Journey Room (Call [place] to register [phone])

## 2016-02-09 NOTE — Progress Notes (Signed)
[name] [name] presents today for injection per MD orders. B12 1078mg administered IM in right Upper Arm. Administration without incident. Patient tolerated well.

## 2016-02-16 ENCOUNTER — Encounter (HOSPITAL_BASED_OUTPATIENT_CLINIC_OR_DEPARTMENT_OTHER): Payer: Medicare Other

## 2016-02-16 VITALS — BP 105/45 | HR 52 | Temp 97.5°F | Resp 18 | Wt 125.2 lb

## 2016-02-16 DIAGNOSIS — Z5111 Encounter for antineoplastic chemotherapy: Secondary | ICD-10-CM | POA: Diagnosis not present

## 2016-02-16 DIAGNOSIS — R197 Diarrhea, unspecified: Secondary | ICD-10-CM | POA: Diagnosis not present

## 2016-02-16 DIAGNOSIS — C7800 Secondary malignant neoplasm of unspecified lung: Secondary | ICD-10-CM | POA: Diagnosis not present

## 2016-02-16 DIAGNOSIS — E538 Deficiency of other specified B group vitamins: Secondary | ICD-10-CM | POA: Diagnosis not present

## 2016-02-16 DIAGNOSIS — D6481 Anemia due to antineoplastic chemotherapy: Secondary | ICD-10-CM

## 2016-02-16 DIAGNOSIS — C259 Malignant neoplasm of pancreas, unspecified: Secondary | ICD-10-CM | POA: Diagnosis not present

## 2016-02-16 DIAGNOSIS — Z95828 Presence of other vascular implants and grafts: Secondary | ICD-10-CM | POA: Diagnosis not present

## 2016-02-16 LAB — CBC WITH DIFFERENTIAL/PLATELET
BASOS ABS: 0 10*3/uL (ref 0.0–0.1)
BASOS PCT: 0 %
EOS PCT: 1 %
Eosinophils Absolute: 0.1 10*3/uL (ref 0.0–0.7)
HCT: 28.2 % — ABNORMAL LOW (ref 36.0–46.0)
Hemoglobin: 9.2 g/dL — ABNORMAL LOW (ref 12.0–15.0)
Lymphocytes Relative: 18 %
Lymphs Abs: 1.1 10*3/uL (ref 0.7–4.0)
MCH: 32.1 pg (ref 26.0–34.0)
MCHC: 32.6 g/dL (ref 30.0–36.0)
MCV: 98.3 fL (ref 78.0–100.0)
MONO ABS: 0.8 10*3/uL (ref 0.1–1.0)
Monocytes Relative: 13 %
Neutro Abs: 4 10*3/uL (ref 1.7–7.7)
Neutrophils Relative %: 68 %
PLATELETS: 134 10*3/uL — AB (ref 150–400)
RBC: 2.87 MIL/uL — ABNORMAL LOW (ref 3.87–5.11)
RDW: 16.2 % — AB (ref 11.5–15.5)
WBC: 6 10*3/uL (ref 4.0–10.5)

## 2016-02-16 LAB — COMPREHENSIVE METABOLIC PANEL
ALBUMIN: 3.5 g/dL (ref 3.5–5.0)
ALT: 11 U/L — ABNORMAL LOW (ref 14–54)
AST: 17 U/L (ref 15–41)
Alkaline Phosphatase: 100 U/L (ref 38–126)
Anion gap: 6 (ref 5–15)
BUN: 19 mg/dL (ref 6–20)
CHLORIDE: 111 mmol/L (ref 101–111)
CO2: 21 mmol/L — AB (ref 22–32)
Calcium: 8.9 mg/dL (ref 8.9–10.3)
Creatinine, Ser: 1.36 mg/dL — ABNORMAL HIGH (ref 0.44–1.00)
GFR calc Af Amer: 42 mL/min — ABNORMAL LOW (ref >60)
GFR calc non Af Amer: 36 mL/min — ABNORMAL LOW (ref >60)
GLUCOSE: 94 mg/dL (ref 65–99)
Potassium: 4.4 mmol/L (ref 3.5–5.1)
SODIUM: 138 mmol/L (ref 135–145)
Total Bilirubin: 0.5 mg/dL (ref 0.3–1.2)
Total Protein: 6.4 g/dL — ABNORMAL LOW (ref 6.5–8.1)

## 2016-02-16 MED ORDER — LEUCOVORIN CALCIUM INJECTION 350 MG
400.0000 mg/m2 | Freq: Once | INTRAVENOUS | Status: AC
  Administered 2016-02-16: 652 mg via INTRAVENOUS
  Filled 2016-02-16: qty 32.6

## 2016-02-16 MED ORDER — PROCHLORPERAZINE MALEATE 10 MG PO TABS
10.0000 mg | ORAL_TABLET | Freq: Once | ORAL | Status: AC
  Administered 2016-02-16: 10 mg via ORAL
  Filled 2016-02-16: qty 1

## 2016-02-16 MED ORDER — SODIUM CHLORIDE 0.9 % IV SOLN
Freq: Once | INTRAVENOUS | Status: AC
  Administered 2016-02-16: 11:00:00 via INTRAVENOUS

## 2016-02-16 MED ORDER — PALONOSETRON HCL INJECTION 0.25 MG/5ML
0.2500 mg | Freq: Once | INTRAVENOUS | Status: AC
  Administered 2016-02-16: 0.25 mg via INTRAVENOUS
  Filled 2016-02-16: qty 5

## 2016-02-16 MED ORDER — SODIUM CHLORIDE 0.9% FLUSH
10.0000 mL | INTRAVENOUS | Status: DC | PRN
  Administered 2016-02-16: 10 mL
  Filled 2016-02-16: qty 10

## 2016-02-16 MED ORDER — IRINOTECAN HCL LIPOSOME CHEMO INJECTION 43 MG/10ML
35.0000 mg/m2 | INJECTION | Freq: Once | INTRAVENOUS | Status: AC
  Administered 2016-02-16: 55.9 mg via INTRAVENOUS
  Filled 2016-02-16: qty 13

## 2016-02-16 MED ORDER — DARBEPOETIN ALFA 500 MCG/ML IJ SOSY
500.0000 ug | PREFILLED_SYRINGE | Freq: Once | INTRAMUSCULAR | Status: AC
  Administered 2016-02-16: 500 ug via SUBCUTANEOUS
  Filled 2016-02-16: qty 1

## 2016-02-16 MED ORDER — CYANOCOBALAMIN 1000 MCG/ML IJ SOLN
1000.0000 ug | Freq: Once | INTRAMUSCULAR | Status: AC
  Administered 2016-02-16: 1000 ug via INTRAMUSCULAR
  Filled 2016-02-16: qty 1

## 2016-02-16 MED ORDER — SODIUM CHLORIDE 0.9 % IV SOLN
1500.0000 mg/m2 | INTRAVENOUS | Status: DC
  Administered 2016-02-16: 2450 mg via INTRAVENOUS
  Filled 2016-02-16: qty 49

## 2016-02-16 MED ORDER — COLD PACK MISC ONCOLOGY: 1.0000 | Freq: Once | Status: DC | PRN

## 2016-02-16 NOTE — Progress Notes (Signed)
[name] [name]  Tolerated chemo tx with Vit B12 and Aranesp injections well without complaints or incident. Labs reviewed prior to administering chemotherapy. Hgb 9.2 so Aranesp given as ordered Leucovorin infused over 30 minutes per [facility] from [facility], pharmacist who advised that  Leucovorin could infuse 160 mg/minute.VSS Pt discharged with 5FU pump infusing without issues. Pt discharged self ambulatory in satisfactory condition accompanied by suster

## 2016-02-16 NOTE — Patient Instructions (Signed)
[facility] Discharge Instructions for Patients Receiving Chemotherapy   Beginning January 23rd 2017 lab work for the [facility] will be done in the  Main lab at [facility] on 1st floor. If you have a lab appointment with the [place] please come in thru the  Main Entrance and check in at the main information desk   Today you received the following chemotherapy agents Irinotecan Liposomal, Leucovorin and 5FU. Follow-up as scheduled. Call clinic for any questions or concerns  To help prevent nausea and vomiting after your treatment, we encourage you to take your nausea medication   If you develop nausea and vomiting, or diarrhea that is not controlled by your medication, call the clinic.  The clinic phone number is [phone]. Office hours are Monday-Friday 8:30am-5:00pm.  BELOW ARE SYMPTOMS THAT SHOULD BE REPORTED IMMEDIATELY:  *FEVER GREATER THAN 101.0 F  *CHILLS WITH OR WITHOUT FEVER  NAUSEA AND VOMITING THAT IS NOT CONTROLLED WITH YOUR NAUSEA MEDICATION  *UNUSUAL SHORTNESS OF BREATH  *UNUSUAL BRUISING OR BLEEDING  TENDERNESS IN MOUTH AND THROAT WITH OR WITHOUT PRESENCE OF ULCERS  *URINARY PROBLEMS  *BOWEL PROBLEMS  UNUSUAL RASH Items with * indicate a potential emergency and should be followed up as soon as possible. If you have an emergency after office hours please contact your primary care physician or go to the nearest emergency department.  Please call the clinic during office hours if you have any questions or concerns.   You may also contact the Patient Navigator at [phone] should you have any questions or need assistance in obtaining follow up care.      Resources For Cancer Patients and their Caregivers ? American Cancer Society: Can assist with transportation, wigs, general needs, runs Look Good Feel Better.        [phone] ? Cancer Care: Provides financial assistance, online support groups, medication/co-pay  assistance.  1-800-813-HOPE 620 759 6493) ? [place] Assists [place] cancer patients and their families through emotional , educational and financial support.  ([phone] ? [place] DSS Where to apply for food stamps, Medicaid and utility assistance. 570-218-0485 ? RCATS: Transportation to medical appointments. 979-593-9076 ? Social Security Administration: May apply for disability if have a Stage IV cancer. 7077701706 [phone] ? [employer], Disability and Transit Services: Assists with nutrition, care and transit needs. [phone]

## 2016-02-18 ENCOUNTER — Encounter (HOSPITAL_BASED_OUTPATIENT_CLINIC_OR_DEPARTMENT_OTHER): Payer: Medicare Other

## 2016-02-18 DIAGNOSIS — C259 Malignant neoplasm of pancreas, unspecified: Secondary | ICD-10-CM

## 2016-02-18 DIAGNOSIS — C7800 Secondary malignant neoplasm of unspecified lung: Secondary | ICD-10-CM

## 2016-02-18 DIAGNOSIS — Z452 Encounter for adjustment and management of vascular access device: Secondary | ICD-10-CM | POA: Diagnosis not present

## 2016-02-18 MED ORDER — HEPARIN SOD (PORK) LOCK FLUSH 100 UNIT/ML IV SOLN
500.0000 [IU] | Freq: Once | INTRAVENOUS | Status: AC | PRN
  Administered 2016-02-18: 500 [IU]

## 2016-02-18 MED ORDER — SODIUM CHLORIDE 0.9% FLUSH
10.0000 mL | INTRAVENOUS | Status: DC | PRN
  Administered 2016-02-18: 10 mL
  Filled 2016-02-18: qty 10

## 2016-02-18 MED ORDER — HEPARIN SOD (PORK) LOCK FLUSH 100 UNIT/ML IV SOLN
INTRAVENOUS | Status: AC
  Filled 2016-02-18: qty 5

## 2016-02-18 NOTE — Patient Instructions (Signed)
[place] at [facility] Discharge Instructions  RECOMMENDATIONS MADE BY THE CONSULTANT AND ANY TEST RESULTS WILL BE SENT TO YOUR REFERRING PHYSICIAN.  5FU pump d/c'd Follow up as scheduled.  Thank you for choosing [place] at [facility] to provide your oncology and hematology care.  To afford each patient quality time with our provider, please arrive at least 15 minutes before your scheduled appointment time.    If you have a lab appointment with the [place] please come in thru the  Main Entrance and check in at the main information desk  You need to re-schedule your appointment should you arrive 10 or more minutes late.  We strive to give you quality time with our providers, and arriving late affects you and other patients whose appointments are after yours.  Also, if you no show three or more times for appointments you may be dismissed from the clinic at the providers discretion.     Again, thank you for choosing [facility].  Our hope is that these requests will decrease the amount of time that you wait before being seen by our physicians.       _____________________________________________________________  Should you have questions after your visit to [facility], please contact our office at [phone] between the hours of 8:30 a.m. and 4:30 p.m.  Voicemails left after 4:30 p.m. will not be returned until the following business day.  For prescription refill requests, have your pharmacy contact our office.       Resources For Cancer Patients and their Caregivers ? American Cancer Society: Can assist with transportation, wigs, general needs, runs Look Good Feel Better.        ([phone] ? Cancer Care: Provides financial assistance, online support groups, medication/co-pay assistance.  1-800-813-HOPE 727-057-3998) ? [place] Assists [place] cancer patients and their  families through emotional , educational and financial support.  ([phone] ? [place] DSS Where to apply for food stamps, Medicaid and utility assistance. 715-116-2900 ? RCATS: Transportation to medical appointments. 732-001-3094 ? Social Security Administration: May apply for disability if have a Stage IV cancer. [phone] ? [employer], Disability and Transit Services: Assists with nutrition, care and transit needs. [place] Support Programs: '@10RELATIVEDAYS'$ @ > Cancer Support Group  2nd Tuesday of the month 1pm-2pm, Journey Room  > Creative Journey  3rd Tuesday of the month 1130am-1pm, Journey Room  > Look Good Feel Better  1st Wednesday of the month 10am-12 noon, Journey Room (Call [place] to register [phone])

## 2016-02-18 NOTE — Progress Notes (Signed)
[name] [name] returns today for port de access and flush after 46 hr continous infusion of 76f. Tolerated infusion without problems. Portacath located right chest wall was deaccessed and flushed with 256mNS and 500U/63m64meparin and needle removed intact.  Procedure without incident. Patient tolerated procedure well.  Vitals stable, little nausea noted, discharged from clinic ambulatory. Follow up as scheduled.

## 2016-02-29 ENCOUNTER — Other Ambulatory Visit (HOSPITAL_COMMUNITY): Payer: Self-pay | Admitting: Hematology & Oncology

## 2016-03-01 ENCOUNTER — Encounter (HOSPITAL_COMMUNITY): Payer: Self-pay | Admitting: Hematology & Oncology

## 2016-03-01 ENCOUNTER — Encounter (HOSPITAL_COMMUNITY): Payer: Medicare Other | Attending: Adult Health | Admitting: Hematology & Oncology

## 2016-03-01 ENCOUNTER — Encounter (HOSPITAL_BASED_OUTPATIENT_CLINIC_OR_DEPARTMENT_OTHER): Payer: Medicare Other

## 2016-03-01 VITALS — BP 110/45 | HR 53 | Temp 97.8°F | Resp 18

## 2016-03-01 VITALS — BP 130/43 | HR 59 | Temp 97.7°F | Resp 20 | Wt 124.0 lb

## 2016-03-01 DIAGNOSIS — D649 Anemia, unspecified: Secondary | ICD-10-CM

## 2016-03-01 DIAGNOSIS — Z95828 Presence of other vascular implants and grafts: Secondary | ICD-10-CM | POA: Insufficient documentation

## 2016-03-01 DIAGNOSIS — Z7901 Long term (current) use of anticoagulants: Secondary | ICD-10-CM | POA: Diagnosis not present

## 2016-03-01 DIAGNOSIS — Z5111 Encounter for antineoplastic chemotherapy: Secondary | ICD-10-CM | POA: Diagnosis not present

## 2016-03-01 DIAGNOSIS — I82C11 Acute embolism and thrombosis of right internal jugular vein: Secondary | ICD-10-CM | POA: Diagnosis not present

## 2016-03-01 DIAGNOSIS — C259 Malignant neoplasm of pancreas, unspecified: Secondary | ICD-10-CM

## 2016-03-01 DIAGNOSIS — C7802 Secondary malignant neoplasm of left lung: Secondary | ICD-10-CM | POA: Diagnosis not present

## 2016-03-01 DIAGNOSIS — C7800 Secondary malignant neoplasm of unspecified lung: Secondary | ICD-10-CM

## 2016-03-01 DIAGNOSIS — R197 Diarrhea, unspecified: Secondary | ICD-10-CM | POA: Diagnosis not present

## 2016-03-01 DIAGNOSIS — E538 Deficiency of other specified B group vitamins: Secondary | ICD-10-CM

## 2016-03-01 DIAGNOSIS — D6481 Anemia due to antineoplastic chemotherapy: Secondary | ICD-10-CM

## 2016-03-01 DIAGNOSIS — T451X5A Adverse effect of antineoplastic and immunosuppressive drugs, initial encounter: Secondary | ICD-10-CM

## 2016-03-01 DIAGNOSIS — R0609 Other forms of dyspnea: Secondary | ICD-10-CM | POA: Diagnosis not present

## 2016-03-01 DIAGNOSIS — J910 Malignant pleural effusion: Secondary | ICD-10-CM | POA: Diagnosis not present

## 2016-03-01 DIAGNOSIS — G8929 Other chronic pain: Secondary | ICD-10-CM

## 2016-03-01 LAB — CBC WITH DIFFERENTIAL/PLATELET
BASOS ABS: 0 10*3/uL (ref 0.0–0.1)
BASOS PCT: 0 %
Eosinophils Absolute: 0 10*3/uL (ref 0.0–0.7)
Eosinophils Relative: 1 %
HEMATOCRIT: 31.4 % — AB (ref 36.0–46.0)
HEMOGLOBIN: 10.1 g/dL — AB (ref 12.0–15.0)
LYMPHS PCT: 14 %
Lymphs Abs: 0.8 10*3/uL (ref 0.7–4.0)
MCH: 32.4 pg (ref 26.0–34.0)
MCHC: 32.2 g/dL (ref 30.0–36.0)
MCV: 100.6 fL — AB (ref 78.0–100.0)
Monocytes Absolute: 0.7 10*3/uL (ref 0.1–1.0)
Monocytes Relative: 11 %
NEUTROS ABS: 4.4 10*3/uL (ref 1.7–7.7)
NEUTROS PCT: 74 %
Platelets: 146 10*3/uL — ABNORMAL LOW (ref 150–400)
RBC: 3.12 MIL/uL — AB (ref 3.87–5.11)
RDW: 16.9 % — AB (ref 11.5–15.5)
WBC: 5.9 10*3/uL (ref 4.0–10.5)

## 2016-03-01 LAB — COMPREHENSIVE METABOLIC PANEL
ALBUMIN: 3.3 g/dL — AB (ref 3.5–5.0)
ALT: 10 U/L — AB (ref 14–54)
AST: 19 U/L (ref 15–41)
Alkaline Phosphatase: 104 U/L (ref 38–126)
Anion gap: 6 (ref 5–15)
BILIRUBIN TOTAL: 0.8 mg/dL (ref 0.3–1.2)
BUN: 13 mg/dL (ref 6–20)
CHLORIDE: 113 mmol/L — AB (ref 101–111)
CO2: 19 mmol/L — ABNORMAL LOW (ref 22–32)
CREATININE: 1.22 mg/dL — AB (ref 0.44–1.00)
Calcium: 8.9 mg/dL (ref 8.9–10.3)
GFR calc Af Amer: 48 mL/min — ABNORMAL LOW (ref >60)
GFR, EST NON AFRICAN AMERICAN: 42 mL/min — AB (ref >60)
GLUCOSE: 149 mg/dL — AB (ref 65–99)
POTASSIUM: 4 mmol/L (ref 3.5–5.1)
Sodium: 138 mmol/L (ref 135–145)
TOTAL PROTEIN: 6.3 g/dL — AB (ref 6.5–8.1)

## 2016-03-01 MED ORDER — SODIUM CHLORIDE 0.9 % IV SOLN
35.0000 mg/m2 | Freq: Once | INTRAVENOUS | Status: AC
  Administered 2016-03-01: 55.9 mg via INTRAVENOUS
  Filled 2016-03-01: qty 13

## 2016-03-01 MED ORDER — SODIUM CHLORIDE 0.9 % IV SOLN
Freq: Once | INTRAVENOUS | Status: AC
  Administered 2016-03-01: 10:00:00 via INTRAVENOUS

## 2016-03-01 MED ORDER — PROCHLORPERAZINE MALEATE 10 MG PO TABS
10.0000 mg | ORAL_TABLET | Freq: Once | ORAL | Status: AC
  Administered 2016-03-01: 10 mg via ORAL
  Filled 2016-03-01: qty 1

## 2016-03-01 MED ORDER — PALONOSETRON HCL INJECTION 0.25 MG/5ML
0.2500 mg | Freq: Once | INTRAVENOUS | Status: AC
  Administered 2016-03-01: 0.25 mg via INTRAVENOUS
  Filled 2016-03-01: qty 5

## 2016-03-01 MED ORDER — SODIUM CHLORIDE 0.9% FLUSH: 10.0000 mL | INTRAVENOUS | Status: DC | PRN

## 2016-03-01 MED ORDER — SODIUM CHLORIDE 0.9 % IV SOLN
1500.0000 mg/m2 | INTRAVENOUS | Status: AC
  Administered 2016-03-01: 2450 mg via INTRAVENOUS
  Filled 2016-03-01: qty 49

## 2016-03-01 MED ORDER — DEXTROSE 5 % IV SOLN
400.0000 mg/m2 | Freq: Once | INTRAVENOUS | Status: AC
  Administered 2016-03-01: 652 mg via INTRAVENOUS
  Filled 2016-03-01: qty 32.6

## 2016-03-01 NOTE — Patient Instructions (Signed)
[place] at [facility] Discharge Instructions  RECOMMENDATIONS MADE BY THE CONSULTANT AND ANY TEST RESULTS WILL BE SENT TO YOUR REFERRING PHYSICIAN.  You were seen today by Dr. Youlanda Roys will see [name] [name] at your next visit and have labs drawn  Thank you for choosing [place] at [facility] to provide your oncology and hematology care.  To afford each patient quality time with our provider, please arrive at least 15 minutes before your scheduled appointment time.    If you have a lab appointment with the [place] please come in thru the  Main Entrance and check in at the main information desk  You need to re-schedule your appointment should you arrive 10 or more minutes late.  We strive to give you quality time with our providers, and arriving late affects you and other patients whose appointments are after yours.  Also, if you no show three or more times for appointments you may be dismissed from the clinic at the providers discretion.     Again, thank you for choosing [facility].  Our hope is that these requests will decrease the amount of time that you wait before being seen by our physicians.       _____________________________________________________________  Should you have questions after your visit to [facility], please contact our office at [phone] between the hours of 8:30 a.m. and 4:30 p.m.  Voicemails left after 4:30 p.m. will not be returned until the following business day.  For prescription refill requests, have your pharmacy contact our office.       Resources For Cancer Patients and their Caregivers ? American Cancer Society: Can assist with transportation, wigs, general needs, runs Look Good Feel Better.        364-689-4818 ? Cancer Care: Provides financial assistance, online support groups, medication/co-pay assistance.  1-800-813-HOPE (774)385-9382) ? [place] Assists [place] cancer patients and their families through emotional , educational and financial support.  ([phone] ? [place] DSS Where to apply for food stamps, Medicaid and utility assistance. 905-733-6911 ? RCATS: Transportation to medical appointments. 2207310726 ? Social Security Administration: May apply for disability if have a Stage IV cancer. 403-119-4630 805-437-6463 ? [employer], Disability and Transit Services: Assists with nutrition, care and transit needs. [place] Support Programs: '@10RELATIVEDAYS'$ @ > Cancer Support Group  2nd Tuesday of the month 1pm-2pm, Journey Room  > Creative Journey  3rd Tuesday of the month 1130am-1pm, Journey Room  > Look Good Feel Better  1st Wednesday of the month 10am-12 noon, Journey Room (Call [place] to register [phone])

## 2016-03-01 NOTE — Progress Notes (Signed)
[place]  Progress Note  Patient Care Team: Monico Blitz, MD as PCP - General  CHIEF COMPLAINTS/PURPOSE OF CONSULTATION:    Pancreatic cancer metastasized to lung [facility])   09/03/2008 Surgery    Whipple with Dr. Birdie Sons for T3N1 3/12 LN+ Pancreatic Adenocarcinoma      11/13/2008 - 09/12/2009 Chemotherapy    Adjuvant Gemcitabine X 6 months      08/31/2009 Surgery    Metastectomy with Dr. Arlyce Dice RUL 2 nodules, RLL (wedge resection X 2)      08/31/2009 Pathology Results    Metastatic adenocarcinoma TTF-1 negative      06/03/2010 Surgery    Left video-assisted thoracoscopic surgery, resection of left lower lobe lesion. with Dr. Arlyce Dice      06/04/2010 Pathology Results    adenocarcinoma positive for cytokeratin 7, focally positive for CDX2, with scattered staining for TTF1 cytokeratin 20. [facility] opinion felt to be lung primary although noted to stain for GI markers, not felt to be pancreatic primary. EGFR and ALK negative      03/31/2011 - 09/04/2011 Radiation Therapy    SBRT LUL 18Gy Dr. Tammi Klippel      03/15/2012 - 06/13/2012 Chemotherapy    5-FU based chemotherapy      02/13/2013 - 05/20/2013 Chemotherapy    Gemzar Abraxane for metastatic disease (given to cover both primaries at [facility])      07/15/2014 PET scan    Mild progression of pulm mets, hypermet mesenteric tumor assoc with  SMA is stable to slightly increased in degree of FDG uptake      07/23/2014 Imaging         07/30/2014 - 09/10/2014 Chemotherapy    Gemzar abraxane stopped due to poor tolerance      04/06/2015 Procedure    L 20 French chest tube for spontaneous pneumothorax, by Dr. Tharon Aquas Trigt      04/20/2015 Pathology Results    High grade carcinoma c/w patient's known pancreatic primary (secondary opinion at [facility]) PDL-1 high expression, preserved major and minor MMR, MSI stable, [facility] ONE testing performed,       04/20/2015 Surgery    Left VATS, mini  thoracotomy with stapling and oversewing of blebs, Talc pleurodesis. performed secondary to persistent air leak not improved with chest tube drainage      07/29/2015 Imaging    No signif change in appear of multifocal B cavitary pulmonary lesions, postop appearance upper abdomen, no findings of met disease to abd or pelvis,      09/29/2015 Imaging    Slight progression of multifocal cavitary pulmonary metastases, as discussed above. 2. Interval development of a lytic lesion in the antral lateral aspect of the left fifth rib where there is a nondisplaced pathologic fracture. No other definite osseous metastases are identified. 3. Prominent soft tissue adjacent to the proximal superior mesenteric artery and in the aortocaval nodal station, similar to prior examinations, likely to represent treated metastatic lymphadenopathy. 4. Epigastric ventral hernia containing several short loops of small bowel. There is a focally dilated loop of small bowel adjacent to this, however, this is an isolated finding. No other dilatation of small bowel or colon to suggest frank bowel obstruction at this time.       10/19/2015 -  Chemotherapy    The patient had palonosetron (ALOXI) injection 0.25 mg, 0.25 mg, Intravenous,  Once, 1 of 4 cycles  leucovorin 652 mg in dextrose 5 % 250 mL infusion, 400 mg/m2 = 652 mg, Intravenous,  Once, 1 of 4 cycles  fluorouracil (ADRUCIL) 2,950 mg in sodium chloride 0.9 % 91 mL chemo infusion, 1,800 mg/m2 = 2,950 mg (100 % of original dose 1,800 mg/m2), Intravenous, 1 Day/Dose, 1 of 4 cycles Dose modification: 1,920 mg/m2 (original dose 1,800 mg/m2, Cycle 1, Reason: Provider Judgment), 1,800 mg/m2 (original dose 1,800 mg/m2, Cycle 1, Reason: Provider Judgment)  irinotecan LIPOSOME (ONIVYDE) 55.9 mg in sodium chloride 0.9 % 500 mL chemo infusion, 35 mg/m2 = 55.9 mg (100 % of original dose 35 mg/m2), Intravenous, Once, 1 of 4 cycles Dose modification: 35 mg/m2 (original dose 35  mg/m2, Cycle 1, Reason: Provider Judgment)  for chemotherapy treatment.        01/12/2016 Imaging    CT CAP- 1. Cavitary lung metastases are stable to decreased in size. 2. Expansile mixed lytic and sclerotic bone metastasis in the anterior left fifth rib is increased in sclerosis and mildly increased in size. 3. Infiltrative soft tissue in the retroperitoneum abutting the SMA is stable. 4. No new sites of metastatic disease in the chest, abdomen or pelvis.       HISTORY OF PRESENTING ILLNESS:  [name] [name] 76 y.o. female is here for a follow up of stage IV pancreatic carcinoma.    She is unaccompanied and on Cycle 10 of Irinotecan/Leucovorin/5-FU.  States she is doing well.  States she has nosebleeds- one time had a blood clot in her sinus. States it is mostly in the morning when she blows her nose. It is better in the afternoon. States she doesn't use a humidifier, but can if needed. States no sign of infection. She does wonder if it is from the dry air.  Reports her skin is dry.  She says her fatigue level is about the same as before. When she gets tired she has to sit down. Has mild SOB, chronic.   States  abdominal pain that hurts on and off associated with her hernia . This is alleviated with her brace.   Appetite is better, eats about 2 meals a day. She asked about not gaining any weight even though she was eating.   States she is getting dry red spots on her face and isn't sure if its related to chemotherapy. She is using Vaseline on them.   She reports that once in a while she has nausea, which is allievated by taking ginger ale.   States she has diarrhea and that some days are bad and some days ok. Takes imodium and it helps alleviate it. She received a recall of her filled lomotil prescription, but she has not bothered to get it refilled because her diarrhea has been managed with imodium.  She has no other questions or concerns at this time.   MEDICAL  HISTORY:  Past Medical History:  Diagnosis Date  . Abdominal wall hernia   . Aortic aneurysm (HCC)    small aortic aneurysm  . Cancer [facility]) July 2010   Pancreatic adenocarcinoma  . Cataract 2010   b/l surgery  . Cervical cancer ([place]) 1963  . COPD (chronic obstructive pulmonary disease) ([place])   . Emphysema   . Goals of care, counseling/discussion 02/02/2016  . Hypercholesterolemia   . Hypertension   . Pancreatic cancer metastasized to lung [facility]) 09/04/2015    SURGICAL HISTORY: Past Surgical History:  Procedure Laterality Date  . BREAST SURGERY  1988   Right breast abcess  . BUNIONECTOMY  2007  . CHOLECYSTECTOMY  July 2010   done w/ Whipple procedure  .  EYE SURGERY  04/26/07   Bilateral cataract  . STAPLING OF BLEBS Left 04/20/2015   Procedure: STAPLING OF BLEBS;  Surgeon: Ivin Poot, MD;  Location: [place];  Service: Thoracic;  Laterality: Left;  . TONSILLECTOMY  04/25/41  . VAGINAL HYSTERECTOMY  1963   Cervical Cancer  . VIDEO ASSISTED THORACOSCOPY Left 04/20/2015   Procedure: VIDEO ASSISTED THORACOSCOPY;  Surgeon: Ivin Poot, MD;  Location: [place];  Service: Thoracic;  Laterality: Left;  . WHIPPLE PROCEDURE  July 2010   pancreatic adenocarcinoma    SOCIAL HISTORY: Social History   Social History  . Marital status: Widowed    Spouse name: N/A  . Number of children: 0  . Years of education: N/A   Occupational History  .  Retired   Social History Main Topics  . Smoking status: Former Smoker    Packs/day: 1.00    Years: 30.00    Types: Cigarettes    Quit date: 02/29/1996  . Smokeless tobacco: Never Used  . Alcohol use No  . Drug use: No  . Sexual activity: No   Other Topics Concern  . Not on file   Social History Narrative  . No narrative on file   Widowed; 5 years ago in December. Married 32 years, no children. Had cervical cancer when she was 21 Pancreatic cancer in July of 2010. Worked at [employer] for 22 years; also worked i Solicitor, at [employer],  Social research officer, government. Smoked, but quit 15-20 years ago.  Enjoys quilting, crocheting, knitting. Has nieces and nephews.  Was born in [place].  FAMILY HISTORY: Family History  Problem Relation Age of Onset  . Pancreatic cancer Brother   . Colon cancer Sister 76    12/2006 dx surgery and chemotherapy  . Ovarian cancer Sister 55    Ovarian ca,  surgery and chemotherapy    One older sister and one younger sister still living. Four brothers deceased. Daddy died of emphysema back in 04-25-73 at the age of 44-68 Mother was 58 when she died in 04-26-1987. Had 4 brothers, died from 25-Apr-1998 to April 25, 2006 Heart trouble, emphysema & dialysis ("he just had everything"). One brother had lung disease. One brother had pancreatic cancer and lived less than 2 months. [name] has had ovarian and colon cancer. Nephew that died last year with brain cancer. Niece with cancer being treated here.   ALLERGIES:  is allergic to oxaliplatin and aspirin.  MEDICATIONS:  Current Outpatient Prescriptions  Medication Sig Dispense Refill  . amoxicillin (AMOXIL) 500 MG capsule     . diphenoxylate-atropine (LOMOTIL) 2.5-0.025 MG tablet Take 1 tablet by mouth 4 (four) times daily as needed for diarrhea or loose stools. 30 tablet 0  . fluorouracil CALGB 74081 in sodium chloride 0.9 % 150 mL Inject into the vein. To be given every 14 days over 46 hours.    . hydrochlorothiazide (HYDRODIURIL) 25 MG tablet Take by mouth.    Marland Kitchen ibuprofen (ADVIL,MOTRIN) 200 MG tablet Take 400 mg by mouth every 6 (six) hours as needed.    . IRINOTECAN HCL IV Inject into the vein. To be given every 14 days.    Marland Kitchen leucovorin in dextrose 5 % 250 mL Inject into the vein once. To be given every 14 days    . levofloxacin (LEVAQUIN) 250 MG tablet Take 2 tablets on day 1 then one tablet daily thereafter until gone 15 tablet 0  . lidocaine-prilocaine (EMLA) cream Apply a quarter size amount to port site 1 hour prior to chemo.  Do not rub in. Cover with plastic wrap. 30 g 3  .  lipase/protease/amylase (CREON) 36000 UNITS CPEP capsule Take 1 capsule (36,000 Units total) by mouth 3 (three) times daily with meals. 270 capsule 1  . lipase/protease/amylase (CREON) 36000 UNITS CPEP capsule Take by mouth.    . loperamide (IMODIUM A-D) 2 MG tablet At the onset of diarrhea, take 2 tabs. Then take 1 tab every 2 hours until you have gone 12 hours without having a loose stool. If it is bedtime and you have a loose stool(s) take 2 tabs every 4 hours until morning. Call [place]. 60 tablet 1  . loperamide (IMODIUM) 2 MG capsule     . losartan (COZAAR) 100 MG tablet     . metoprolol succinate (TOPROL-XL) 50 MG 24 hr tablet Take 25 mg by mouth daily before breakfast. Take with or immediately following a meal.    . nystatin cream (MYCOSTATIN)     . omeprazole (PRILOSEC) 20 MG capsule Take 20 mg by mouth daily.     . ondansetron (ZOFRAN) 8 MG tablet Take 1 tablet (8 mg total) by mouth every 8 (eight) hours as needed for nausea or vomiting. 30 tablet 2  . Potassium Bicarb-Citric Acid (EFFER-K) 10 MEQ TBEF Take 1 tablet (10 mEq total) by mouth 2 (two) times daily. 60 each 0  . potassium chloride SA (K-DUR,KLOR-CON) 20 MEQ tablet Take by mouth.    . prochlorperazine (COMPAZINE) 10 MG tablet Take 1 tablet (10 mg total) by mouth every 6 (six) hours as needed for nausea or vomiting. 30 tablet 2  . XARELTO 20 MG TABS tablet      No current facility-administered medications for this visit.     Review of Systems  Constitutional: Positive for malaise/fatigue (same level as previous).       Appetite is better than before, eat 2 meals a day.  HENT: Positive for nosebleeds.   Eyes: Negative.   Respiratory: Positive for shortness of breath (mild).   Cardiovascular: Negative.   Gastrointestinal: Positive for abdominal pain, diarrhea (some days better than others, managed with imodium) and nausea (once in a while, allievated with ginger ale). Negative for constipation and vomiting.    Genitourinary: Negative.   Skin: Negative.        Dry. Also dry red spots on her face.  Neurological: Negative.   Endo/Heme/Allergies: Negative.   Psychiatric/Behavioral: Negative.   All other systems reviewed and are negative. 14 point ROS was done and is otherwise as detailed above or in HPI   PHYSICAL EXAMINATION: ECOG PERFORMANCE STATUS: 1 - Symptomatic but completely ambulatory  Vitals:   03/01/16 0918  BP: (!) 130/43  Pulse: (!) 59  Resp: 20  Temp: 97.7 F (36.5 C)   Filed Weights   03/01/16 0918  Weight: 124 lb (56.2 kg)    Physical Exam  Constitutional: She is oriented to person, place, and time and well-developed, well-nourished, and in no distress. No distress.  Well groomed  HENT:  Head: Normocephalic and atraumatic.  Mouth/Throat: Oropharynx is clear and moist. No oropharyngeal exudate.  Eyes: Conjunctivae and EOM are normal. Pupils are equal, round, and reactive to light. Right eye exhibits no discharge. Left eye exhibits no discharge. No scleral icterus.  Neck: Normal range of motion. Neck supple. No tracheal deviation present. No thyromegaly present.  Cardiovascular: Normal rate, regular rhythm and intact distal pulses.   No murmur heard. Pulmonary/Chest: Effort normal and breath sounds normal.  Abdominal: Soft. Bowel sounds are  normal. She exhibits no distension and no mass. There is no tenderness. There is no rebound and no guarding.  Abdominal binder  Musculoskeletal: Normal range of motion. She exhibits no edema or tenderness.  Lymphadenopathy:    She has no cervical adenopathy.  Neurological: She is alert and oriented to person, place, and time. Gait normal. Coordination normal.  Skin: Skin is warm and dry. She is not diaphoretic.  One small red spot on her cheek. - non impressive  Psychiatric: Mood, memory, affect and judgment normal.  Nursing note and vitals reviewed.   LABORATORY DATA:  I have reviewed the data as listed   Results for  MARCEA, ROJEK (MRN [id number]) as of 03/01/2016 17:06  Ref. Range 03/01/2016 09:10 03/01/2016 09:13  COMPREHENSIVE METABOLIC PANEL Unknown  Rpt (A)  Sodium Latest Ref Range: 135 - 145 mmol/L  138  Potassium Latest Ref Range: 3.5 - 5.1 mmol/L  4.0  Chloride Latest Ref Range: 101 - 111 mmol/L  113 (H)  CO2 Latest Ref Range: 22 - 32 mmol/L  19 (L)  Glucose Latest Ref Range: 65 - 99 mg/dL  149 (H)  BUN Latest Ref Range: 6 - 20 mg/dL  13  Creatinine Latest Ref Range: 0.44 - 1.00 mg/dL  1.22 (H)  Calcium Latest Ref Range: 8.9 - 10.3 mg/dL  8.9  Anion gap Latest Ref Range: 5 - 15   6  Alkaline Phosphatase Latest Ref Range: 38 - 126 U/L  104  Albumin Latest Ref Range: 3.5 - 5.0 g/dL  3.3 (L)  AST Latest Ref Range: 15 - 41 U/L  19  ALT Latest Ref Range: 14 - 54 U/L  10 (L)  Total Protein Latest Ref Range: 6.5 - 8.1 g/dL  6.3 (L)  Total Bilirubin Latest Ref Range: 0.3 - 1.2 mg/dL  0.8  EGFR (African American) Latest Ref Range: >60 mL/min  48 (L)  EGFR (Non-African Amer.) Latest Ref Range: >60 mL/min  42 (L)  WBC Latest Ref Range: 4.0 - 10.5 K/uL 5.9   RBC Latest Ref Range: 3.87 - 5.11 MIL/uL 3.12 (L)   Hemoglobin Latest Ref Range: 12.0 - 15.0 g/dL 10.1 (L)   HCT Latest Ref Range: 36.0 - 46.0 % 31.4 (L)   MCV Latest Ref Range: 78.0 - 100.0 fL 100.6 (H)   MCH Latest Ref Range: 26.0 - 34.0 pg 32.4   MCHC Latest Ref Range: 30.0 - 36.0 g/dL 32.2   RDW Latest Ref Range: 11.5 - 15.5 % 16.9 (H)   Platelets Latest Ref Range: 150 - 400 K/uL 146 (L)   Neutrophils Latest Units: % 74   Lymphocytes Latest Units: % 14   Monocytes Relative Latest Units: % 11   Eosinophil Latest Units: % 1   Basophil Latest Units: % 0   NEUT# Latest Ref Range: 1.7 - 7.7 K/uL 4.4   Lymphocyte # Latest Ref Range: 0.7 - 4.0 K/uL 0.8   Monocyte # Latest Ref Range: 0.1 - 1.0 K/uL 0.7   Eosinophils Absolute Latest Ref Range: 0.0 - 0.7 K/uL 0.0   Basophils Absolute Latest Ref Range: 0.0 - 0.1 K/uL 0.0     PATHOLOGY     2012                  RADIOGRAPHIC STUDIES: I have personally reviewed the radiological images as listed and agreed with the findings in the report. No results found. Study Result   CLINICAL DATA:  76 year old female with stage IV pancreatic cancer initially  diagnosed in 2010 status post Whipple procedure, status post wedge resection x2 for right pulmonary metastases in July 2011, status post left lower lobe wedge resection able 2012 for adenocarcinoma, status post radiation therapy to the left upper lobe completed July 2013, status post left VATS with talc pleurodesis 04/20/2015 due to persistent spontaneous pneumothorax, with ongoing chemotherapy, presenting for restaging.  EXAM: CT CHEST, ABDOMEN, AND PELVIS WITH CONTRAST  TECHNIQUE: Multidetector CT imaging of the chest, abdomen and pelvis was performed following the standard protocol during bolus administration of intravenous contrast.  CONTRAST:  45m ISOVUE-300 IOPAMIDOL (ISOVUE-300) INJECTION 61%  COMPARISON:  09/29/2015 CT chest, abdomen and pelvis.  FINDINGS: CT CHEST FINDINGS  Cardiovascular: Normal heart size. No significant pericardial fluid/thickening. Left main, left anterior descending, left circumflex and right coronary atherosclerosis. Right internal jugular MediPort terminates in the lower third of the superior vena cava. Atherosclerotic nonaneurysmal thoracic aorta. Normal caliber pulmonary arteries. No central pulmonary emboli.  Mediastinum/Nodes: Dominant hypodense posterior right thyroid lobe 1.7 cm nodule, stable. Unremarkable esophagus. No pathologically enlarged axillary, mediastinal or hilar lymph nodes.  Lungs/Pleura: No pneumothorax. No pleural effusion. Stable patchy pleural thickening and calcification in the left pleural space from left talc pleurodesis. Moderate centrilobular emphysema. Stable postsurgical changes from right upper lobe, right  lower lobe and left lower lobe wedge resections. No acute consolidative airspace disease. Multiple irregular cavitary pulmonary nodules and masses in the mid to lower lungs bilaterally appear stable to the decreased in size. For example in the posterior right lower lobe, a 5.0 x 3.4 cm cavitary mass (series 3/ image 116), previously 6.9 x 4.0 cm, decreased. Anterior right lower lobe 2.7 x 1.4 cm irregular cavitary nodule (series 3/ image 108), previously 2.8 x 1.4 cm, not appreciably changed. Anterior left lower lobe partially cavitary irregular 1.8 x 1.5 cm nodule (series 3/ image 110), previously 2.0 x 1.9 cm, mildly decreased. No appreciable new or enlarging pulmonary nodules. Stable irregular bandlike radiation fibrosis in the left upper lobe.  Musculoskeletal: Stable nonunion of a mildly displaced chronic anterior left fourth rib fracture. Stable healed deformity in the anterolateral left sixth rib. Increased sclerosis associated with the mixed lytic and sclerotic expansile bone lesion in the anterior left fifth rib with associated nondisplaced pathologic fracture, with increased bony expansion (14 mm diameter on series 2/image 36, previously 12 mm diameter). Moderate thoracic spondylosis. No new thoracic osseous lesions.  CT ABDOMEN PELVIS FINDINGS  Hepatobiliary: Asymmetric enlargement of the left liver lobe with diffusely mildly irregular liver surface, suggesting cirrhosis, unchanged. No liver mass. Cholecystectomy. Mild diffuse intrahepatic biliary ductal dilatation with extensive pneumobilia, unchanged. Stable appearance of the choledochojejunostomy.  Pancreas: Status post resection of the pancreatic head and neck. Stable appearance of the relatively atrophic remnant pancreatic body and tail. Stable gas within the mildly dilated pancreatic bile duct. Pancreaticojejunostomy appears stable. No pancreatic or anastomotic mass.  Spleen: Normal size. No  mass.  Adrenals/Urinary Tract: Stable 1.7 cm right adrenal nodule, non hypermetabolic on prior PET-CT studies and stable back to the 10/28/2008, consistent with a benign adenoma. No left adrenal nodule. No hydronephrosis. Simple 2.0 cm anterior lower left renal cyst. Normal bladder.  Stomach/Bowel: Stable large midline ventral upper abdominal wall hernia containing a portion of the distal stomach, gastrojejunostomy and multiple proximal small bowel loops, with no evidence of significant gastric distention, small bowel dilatation, pneumatosis or significant bowel wall thickening. Normal caliber small bowel with no small bowel wall thickening. Normal appendix. Normal large bowel with no diverticulosis, large bowel  wall thickening or pericolonic fat stranding.  Vascular/Lymphatic: Atherosclerotic abdominal aorta. Stable aneurysmal dilatation of the infrarenal abdominal aorta, maximum diameter 3.9 cm. Patent portal, splenic and renal veins. Infiltrative soft tissue abutting the portosplenic venous confluence, SMA, IVC and abdominal aorta measures 2.5 x 1.8 cm (series 2/ image 61), previously 2.5 x 1.8 cm, stable. Otherwise no pathologically enlarged abdominopelvic lymph nodes.  Reproductive: Status post hysterectomy, with no abnormal findings at the vaginal cuff. No adnexal mass.  Other: No pneumoperitoneum, ascites or focal fluid collection.  Musculoskeletal: No aggressive appearing focal osseous lesions. Mild lumbar spondylosis.  IMPRESSION: 1. Cavitary lung metastases are stable to decreased in size. 2. Expansile mixed lytic and sclerotic bone metastasis in the anterior left fifth rib is increased in sclerosis and mildly increased in size. 3. Infiltrative soft tissue in the retroperitoneum abutting the SMA is stable. 4. No new sites of metastatic disease in the chest, abdomen or pelvis. 5. Aortic atherosclerosis. Stable 3.9 cm infrarenal abdominal aortic aneurysm. 6.  Stable postsurgical and numerous additional chronic changes as described.   Electronically Signed   By: Ilona Sorrel M.D.   On: 01/12/2016 16:23       ASSESSMENT & PLAN:  Stage IV Pancreatic Adenocarcinoma Lung Adenocarcinoma RIJ thrombosis Transverse Sinus Thrombosis Chronic Anticoagulation with lovenox L pneumothorax, spontaneous Malignant Pleural Effusion, Talc Pleurodesis Preserved expression of major/minor MMR proteins done on 2017 LUL biopsy by Dr. Prescott Gum Dyspnea Anemia Macrocytosis B12 deficiency  Complicated course but she has certainly done well for many years. She and her sister are not unrealistic. She has a HCPOA and living will in place. We discussed that although her pancreatic cancer is PDL1 positive this unfortunately does not translate into benefit from immunotherapy, MSI - high status does and unfortunately, her disease is MSI - stable. .  Lab results reviewed. Results noted above. She is macrocytic, may be from 5-FU, she was diagnosed with B12 deficiency in December. She is continuing on IM B12.   She doesn't need refills on anything at this time.   The patient complains of red dry spots on her face. We will monitor this, and refer to dermatology as needed.   She will follow up with the clinic before her next cycle.  Dyspnea is chronic. She continues to be independent in all of her ADL's.    Orders Placed This Encounter  Procedures  . CBC with Differential    Standing Status:   Future    Standing Expiration Date:   03/01/2017  . Comprehensive metabolic panel    Standing Status:   Future    Standing Expiration Date:   03/01/2017  . Cancer antigen 19-9    Standing Status:   Future    Standing Expiration Date:   03/01/2017   All questions were answered. The patient knows to call the clinic with any problems, questions or concerns.  This document serves as a record of services personally performed by Ancil Linsey, MD. It was created on her behalf  by Shirlean Mylar, a trained medical scribe. The creation of this record is based on the scribe's personal observations and the provider's statements to them. This document has been checked and approved by the attending provider.  I have reviewed the above documentation for accuracy and completeness and I agree with the above.  This note was electronically signed.    Molli Hazard, MD  03/01/2016 9:48 AM

## 2016-03-02 LAB — CANCER ANTIGEN 19-9: CA 19 9: 196 U/mL — AB (ref 0–35)

## 2016-03-03 ENCOUNTER — Encounter (HOSPITAL_BASED_OUTPATIENT_CLINIC_OR_DEPARTMENT_OTHER): Payer: Medicare Other

## 2016-03-03 VITALS — BP 137/56 | HR 59 | Temp 98.1°F | Resp 18

## 2016-03-03 DIAGNOSIS — Z452 Encounter for adjustment and management of vascular access device: Secondary | ICD-10-CM | POA: Diagnosis present

## 2016-03-03 DIAGNOSIS — C259 Malignant neoplasm of pancreas, unspecified: Secondary | ICD-10-CM | POA: Diagnosis not present

## 2016-03-03 DIAGNOSIS — C7802 Secondary malignant neoplasm of left lung: Secondary | ICD-10-CM

## 2016-03-03 DIAGNOSIS — C7800 Secondary malignant neoplasm of unspecified lung: Principal | ICD-10-CM

## 2016-03-03 MED ORDER — SODIUM CHLORIDE 0.9% FLUSH
10.0000 mL | INTRAVENOUS | Status: DC | PRN
  Administered 2016-03-03: 10 mL
  Filled 2016-03-03: qty 10

## 2016-03-03 MED ORDER — HEPARIN SOD (PORK) LOCK FLUSH 100 UNIT/ML IV SOLN
500.0000 [IU] | Freq: Once | INTRAVENOUS | Status: AC | PRN
  Administered 2016-03-03: 500 [IU]

## 2016-03-03 NOTE — Patient Instructions (Signed)
[name] [name] at [facility] Discharge Instructions  RECOMMENDATIONS MADE BY THE CONSULTANT AND ANY TEST RESULTS WILL BE SENT TO YOUR REFERRING PHYSICIAN.  5FU pump discontinued and port flushed per protocol today. Follow-up as scheduled. Call clinic for any questions or concerns  Thank you for choosing [place] at [facility] to provide your oncology and hematology care.  To afford each patient quality time with our provider, please arrive at least 15 minutes before your scheduled appointment time.    If you have a lab appointment with the [facility] please come in thru the  Main Entrance and check in at the main information desk  You need to re-schedule your appointment should you arrive 10 or more minutes late.  We strive to give you quality time with our providers, and arriving late affects you and other patients whose appointments are after yours.  Also, if you no show three or more times for appointments you may be dismissed from the clinic at the providers discretion.     Again, thank you for choosing [facility].  Our hope is that these requests will decrease the amount of time that you wait before being seen by our physicians.       _____________________________________________________________  Should you have questions after your visit to [facility], please contact our office at [phone] between the hours of 8:30 a.m. and 4:30 p.m.  Voicemails left after 4:30 p.m. will not be returned until the following business day.  For prescription refill requests, have your pharmacy contact our office.       Resources For Cancer Patients and their Caregivers ? American Cancer Society: Can assist with transportation, wigs, general needs, runs Look Good Feel Better.        ([phone] ? Cancer Care: Provides financial assistance, online support groups, medication/co-pay assistance.  1-800-813-HOPE  (352) 631-7801) ? [place] Assists [place] cancer patients and their families through emotional , educational and financial support.  913-044-3599 ? [place] DSS Where to apply for food stamps, Medicaid and utility assistance. 667-759-0625 ? [employer]: Transportation to medical appointments. 308-198-7097 ? Social Security Administration: May apply for disability if have a Stage IV cancer. 763 017 7011 209-127-7231 ? [employer], Disability and Transit Services: Assists with nutrition, care and transit needs. [place] Support Programs: '@10RELATIVEDAYS'$ @ > Cancer Support Group  2nd Tuesday of the month 1pm-2pm, Journey Room  > Creative Journey  3rd Tuesday of the month 1130am-1pm, Journey Room  > Look Good Feel Better  1st Wednesday of the month 10am-12 noon, Journey Room (Call [place] to register [phone])

## 2016-03-03 NOTE — Progress Notes (Signed)
[name] [name] tolerated 5FU pump well without complaints or incident. 5FU pump discontinued and port flushed with 10 ml NS and 5 ml Heparin easily per protocol. VSS Pt discharged self ambulatory in satisfactory condition

## 2016-03-05 ENCOUNTER — Encounter (HOSPITAL_COMMUNITY): Payer: Self-pay | Admitting: Certified Nurse Midwife

## 2016-03-05 ENCOUNTER — Emergency Department: Payer: Self-pay

## 2016-03-05 ENCOUNTER — Other Ambulatory Visit: Payer: Self-pay

## 2016-03-05 ENCOUNTER — Emergency Department (HOSPITAL_COMMUNITY): Payer: Medicare Other

## 2016-03-05 ENCOUNTER — Inpatient Hospital Stay (HOSPITAL_COMMUNITY)
Admission: EM | Admit: 2016-03-05 | Discharge: 2016-03-11 | DRG: 200 | Disposition: A | Discharge status: Home / Self Care | Payer: Medicare Other | Attending: Surgery | Admitting: Surgery

## 2016-03-05 DIAGNOSIS — J9383 Other pneumothorax: Secondary | ICD-10-CM | POA: Diagnosis present

## 2016-03-05 DIAGNOSIS — Z87891 Personal history of nicotine dependence: Secondary | ICD-10-CM | POA: Diagnosis not present

## 2016-03-05 DIAGNOSIS — Z90411 Acquired partial absence of pancreas: Secondary | ICD-10-CM | POA: Diagnosis not present

## 2016-03-05 DIAGNOSIS — J9311 Primary spontaneous pneumothorax: Secondary | ICD-10-CM | POA: Diagnosis not present

## 2016-03-05 DIAGNOSIS — Z902 Acquired absence of lung [part of]: Secondary | ICD-10-CM | POA: Diagnosis not present

## 2016-03-05 DIAGNOSIS — R0902 Hypoxemia: Secondary | ICD-10-CM | POA: Diagnosis not present

## 2016-03-05 DIAGNOSIS — Z90710 Acquired absence of both cervix and uterus: Secondary | ICD-10-CM

## 2016-03-05 DIAGNOSIS — Z9689 Presence of other specified functional implants: Secondary | ICD-10-CM

## 2016-03-05 DIAGNOSIS — J948 Other specified pleural conditions: Secondary | ICD-10-CM | POA: Diagnosis present

## 2016-03-05 DIAGNOSIS — Z888 Allergy status to other drugs, medicaments and biological substances status: Secondary | ICD-10-CM

## 2016-03-05 DIAGNOSIS — R079 Chest pain, unspecified: Secondary | ICD-10-CM | POA: Diagnosis not present

## 2016-03-05 DIAGNOSIS — C7800 Secondary malignant neoplasm of unspecified lung: Secondary | ICD-10-CM | POA: Diagnosis present

## 2016-03-05 DIAGNOSIS — Z978 Presence of other specified devices: Secondary | ICD-10-CM | POA: Diagnosis not present

## 2016-03-05 DIAGNOSIS — Z8541 Personal history of malignant neoplasm of cervix uteri: Secondary | ICD-10-CM

## 2016-03-05 DIAGNOSIS — R0602 Shortness of breath: Secondary | ICD-10-CM | POA: Diagnosis not present

## 2016-03-05 DIAGNOSIS — J449 Chronic obstructive pulmonary disease, unspecified: Secondary | ICD-10-CM | POA: Diagnosis not present

## 2016-03-05 DIAGNOSIS — Z7901 Long term (current) use of anticoagulants: Secondary | ICD-10-CM | POA: Diagnosis not present

## 2016-03-05 DIAGNOSIS — Z8041 Family history of malignant neoplasm of ovary: Secondary | ICD-10-CM

## 2016-03-05 DIAGNOSIS — Z9889 Other specified postprocedural states: Secondary | ICD-10-CM | POA: Diagnosis not present

## 2016-03-05 DIAGNOSIS — Z79899 Other long term (current) drug therapy: Secondary | ICD-10-CM | POA: Diagnosis not present

## 2016-03-05 DIAGNOSIS — C7801 Secondary malignant neoplasm of right lung: Secondary | ICD-10-CM | POA: Diagnosis not present

## 2016-03-05 DIAGNOSIS — R0682 Tachypnea, not elsewhere classified: Secondary | ICD-10-CM | POA: Diagnosis not present

## 2016-03-05 DIAGNOSIS — Z8507 Personal history of malignant neoplasm of pancreas: Secondary | ICD-10-CM | POA: Diagnosis not present

## 2016-03-05 DIAGNOSIS — C7802 Secondary malignant neoplasm of left lung: Secondary | ICD-10-CM | POA: Diagnosis not present

## 2016-03-05 DIAGNOSIS — Z9049 Acquired absence of other specified parts of digestive tract: Secondary | ICD-10-CM

## 2016-03-05 DIAGNOSIS — J939 Pneumothorax, unspecified: Secondary | ICD-10-CM | POA: Diagnosis present

## 2016-03-05 DIAGNOSIS — I10 Essential (primary) hypertension: Secondary | ICD-10-CM | POA: Diagnosis not present

## 2016-03-05 DIAGNOSIS — K219 Gastro-esophageal reflux disease without esophagitis: Secondary | ICD-10-CM | POA: Diagnosis not present

## 2016-03-05 DIAGNOSIS — Z4682 Encounter for fitting and adjustment of non-vascular catheter: Secondary | ICD-10-CM

## 2016-03-05 DIAGNOSIS — C259 Malignant neoplasm of pancreas, unspecified: Secondary | ICD-10-CM | POA: Diagnosis not present

## 2016-03-05 DIAGNOSIS — Z800 Family history of malignant neoplasm of digestive organs: Secondary | ICD-10-CM | POA: Diagnosis not present

## 2016-03-05 DIAGNOSIS — J90 Pleural effusion, not elsewhere classified: Secondary | ICD-10-CM | POA: Diagnosis not present

## 2016-03-05 DIAGNOSIS — Z9221 Personal history of antineoplastic chemotherapy: Secondary | ICD-10-CM

## 2016-03-05 DIAGNOSIS — C3490 Malignant neoplasm of unspecified part of unspecified bronchus or lung: Secondary | ICD-10-CM | POA: Diagnosis not present

## 2016-03-05 HISTORY — DX: Anemia, unspecified: D64.9

## 2016-03-05 HISTORY — DX: Malignant neoplasm of pancreas, unspecified: C25.9

## 2016-03-05 HISTORY — DX: Unspecified osteoarthritis, unspecified site: M19.90

## 2016-03-05 HISTORY — DX: Gastro-esophageal reflux disease without esophagitis: K21.9

## 2016-03-05 LAB — BASIC METABOLIC PANEL
ANION GAP: 10 (ref 5–15)
BUN: 16 mg/dL (ref 6–20)
CO2: 21 mmol/L — AB (ref 22–32)
CREATININE: 1.08 mg/dL — AB (ref 0.44–1.00)
Calcium: 8.9 mg/dL (ref 8.9–10.3)
Chloride: 106 mmol/L (ref 101–111)
GFR calc Af Amer: 56 mL/min — ABNORMAL LOW (ref >60)
GFR calc non Af Amer: 48 mL/min — ABNORMAL LOW (ref >60)
GLUCOSE: 121 mg/dL — AB (ref 65–99)
Potassium: 4.3 mmol/L (ref 3.5–5.1)
Sodium: 137 mmol/L (ref 135–145)

## 2016-03-05 LAB — CBC
HCT: 29.8 % — ABNORMAL LOW (ref 36.0–46.0)
HEMOGLOBIN: 9.6 g/dL — AB (ref 12.0–15.0)
MCH: 31.5 pg (ref 26.0–34.0)
MCHC: 32.2 g/dL (ref 30.0–36.0)
MCV: 97.7 fL (ref 78.0–100.0)
Platelets: 173 10*3/uL (ref 150–400)
RBC: 3.05 MIL/uL — ABNORMAL LOW (ref 3.87–5.11)
RDW: 15.9 % — ABNORMAL HIGH (ref 11.5–15.5)
WBC: 4.6 10*3/uL (ref 4.0–10.5)

## 2016-03-05 MED ORDER — LIDOCAINE-EPINEPHRINE (PF) 2 %-1:200000 IJ SOLN
INTRAMUSCULAR | Status: AC
  Administered 2016-03-05: 20 mL
  Filled 2016-03-05: qty 20

## 2016-03-05 MED ORDER — SODIUM CHLORIDE 0.9% FLUSH: 3.0000 mL | Freq: Two times a day (BID) | INTRAVENOUS | Status: DC

## 2016-03-05 MED ORDER — RIVAROXABAN 20 MG PO TABS: 20.0000 mg | ORAL_TABLET | Freq: Every day | ORAL | Status: DC

## 2016-03-05 MED ORDER — SODIUM CHLORIDE 0.9% FLUSH: 3.0000 mL | INTRAVENOUS | Status: DC | PRN

## 2016-03-05 MED ORDER — SODIUM CHLORIDE 0.9 % IV SOLN: 250.0000 mL | INTRAVENOUS | Status: DC | PRN

## 2016-03-05 MED ORDER — LIDOCAINE HCL 2 % IJ SOLN: 20.0000 mL | Freq: Once | INTRAMUSCULAR | Status: DC

## 2016-03-05 MED ORDER — PANCRELIPASE (LIP-PROT-AMYL) 12000-38000 UNITS PO CPEP
36000.0000 [IU] | ORAL_CAPSULE | Freq: Three times a day (TID) | ORAL | Status: DC
  Administered 2016-03-06 – 2016-03-11 (×13): 36000 [IU] via ORAL
  Filled 2016-03-05 (×3): qty 3
  Filled 2016-03-05: qty 1
  Filled 2016-03-05: qty 3
  Filled 2016-03-05: qty 1
  Filled 2016-03-05 (×11): qty 3

## 2016-03-05 MED ORDER — METOPROLOL SUCCINATE ER 25 MG PO TB24
25.0000 mg | ORAL_TABLET | Freq: Every day | ORAL | Status: DC
  Administered 2016-03-06 – 2016-03-11 (×6): 25 mg via ORAL
  Filled 2016-03-05 (×6): qty 1

## 2016-03-05 MED ORDER — MORPHINE SULFATE (PF) 4 MG/ML IV SOLN
4.0000 mg | Freq: Once | INTRAVENOUS | Status: AC
  Administered 2016-03-05: 4 mg via INTRAVENOUS

## 2016-03-05 MED ORDER — PROMETHAZINE HCL 25 MG/ML IJ SOLN
12.5000 mg | Freq: Once | INTRAMUSCULAR | Status: AC
Start: 2016-03-05 — End: 2016-03-05
  Administered 2016-03-05: 12.5 mg via INTRAVENOUS
  Filled 2016-03-05: qty 1

## 2016-03-05 MED ORDER — TRAMADOL HCL 50 MG PO TABS: 50.0000 mg | ORAL_TABLET | Freq: Four times a day (QID) | ORAL | Status: DC | PRN

## 2016-03-05 MED ORDER — ACETAMINOPHEN 325 MG PO TABS: 650.0000 mg | ORAL_TABLET | Freq: Four times a day (QID) | ORAL | Status: DC | PRN

## 2016-03-05 MED ORDER — ONDANSETRON HCL 4 MG/2ML IJ SOLN
4.0000 mg | Freq: Four times a day (QID) | INTRAMUSCULAR | Status: DC | PRN
  Administered 2016-03-05 – 2016-03-07 (×3): 4 mg via INTRAVENOUS
  Filled 2016-03-05 (×3): qty 2

## 2016-03-05 MED ORDER — PROCHLORPERAZINE MALEATE 10 MG PO TABS
10.0000 mg | ORAL_TABLET | Freq: Four times a day (QID) | ORAL | Status: DC | PRN
  Administered 2016-03-07: 10 mg via ORAL
  Filled 2016-03-05 (×2): qty 1

## 2016-03-05 MED ORDER — ACETAMINOPHEN 650 MG RE SUPP: 650.0000 mg | Freq: Four times a day (QID) | RECTAL | Status: DC | PRN

## 2016-03-05 MED ORDER — POTASSIUM CHLORIDE CRYS ER 20 MEQ PO TBCR: 20.0000 meq | EXTENDED_RELEASE_TABLET | Freq: Every day | ORAL | Status: DC

## 2016-03-05 MED ORDER — SODIUM CHLORIDE 0.9% FLUSH
10.0000 mL | INTRAVENOUS | Status: DC | PRN
  Administered 2016-03-05 – 2016-03-09 (×2): 10 mL
  Filled 2016-03-05 (×2): qty 40

## 2016-03-05 MED ORDER — ONDANSETRON HCL 4 MG PO TABS: 4.0000 mg | ORAL_TABLET | Freq: Four times a day (QID) | ORAL | Status: DC | PRN

## 2016-03-05 MED ORDER — DEXTROSE-NACL 5-0.9 % IV SOLN
INTRAVENOUS | Status: DC
  Administered 2016-03-05 – 2016-03-09 (×5): via INTRAVENOUS

## 2016-03-05 MED ORDER — MORPHINE SULFATE (PF) 4 MG/ML IV SOLN
INTRAVENOUS | Status: AC
  Filled 2016-03-05: qty 1

## 2016-03-05 MED ORDER — OXYCODONE HCL 5 MG PO TABS
10.0000 mg | ORAL_TABLET | ORAL | Status: DC | PRN
  Administered 2016-03-05: 10 mg via ORAL
  Filled 2016-03-05: qty 2

## 2016-03-05 MED ORDER — METOCLOPRAMIDE HCL 5 MG/ML IJ SOLN
10.0000 mg | Freq: Once | INTRAMUSCULAR | Status: AC
  Administered 2016-03-05: 10 mg via INTRAVENOUS
  Filled 2016-03-05: qty 2

## 2016-03-05 NOTE — ED Provider Notes (Signed)
[place] DEPT Provider Note   CSN: [id number] Arrival date & time: 03/05/16  1801     History   Chief Complaint Chief Complaint  Patient presents with  . Shortness of Breath    HPI [name] [name] is a 76 y.o. female.  HPI 76 year old female with a history of pancreatic cancer with lung metastases sent from [facility] for right-sided pneumothorax. Patient wanted to be transferred to have a cardiothoracic surgeon perform the chest tube. She is currently on chemotherapy. She states she woke up this morning she was severely short of breath and thought it was gas in her stomach however her abdominal discomfort improved but she still felt short of breath. She's had a pneumothorax in the past related to cancer. [facility] did a chest x-ray and a CT chest. CT chest showed 40-50% pneumothorax on the right. She denies any chest pain sign. She endorses shortness of breath. Her shortness of breath slightly worsens with exertion and improved with rest however patient still complains of dyspnea on rest. She denies recent fevers, cough, congestion, abdominal pain, nausea, vomiting, diarrhea.  Past Medical History:  Diagnosis Date  . Abdominal wall hernia   . Aortic aneurysm (HCC)    small aortic aneurysm  . Cancer [facility]) July 2010   Pancreatic adenocarcinoma  . Cataract 2010   b/l surgery  . Cervical cancer ([place]) 1963  . COPD (chronic obstructive pulmonary disease) ([place])   . Emphysema   . Goals of care, counseling/discussion 02/02/2016  . Hypercholesterolemia   . Hypertension   . Pancreatic cancer metastasized to lung [facility]) 09/04/2015    Patient Active Problem List   Diagnosis Date Noted  . Goals of care, counseling/discussion 02/02/2016  . Low serum vitamin B12 01/20/2016  . Low blood potassium 12/08/2015  . Pancreatic cancer metastasized to lung ([place]) 09/04/2015  . Pneumothorax, left 04/06/2015  . Cervical cancer ([place])   . Emphysema   . Hypertension   . Lung cancer ([place])  06/11/2010  . Cancer ([place]) 08/21/2008    Past Surgical History:  Procedure Laterality Date  . BREAST SURGERY  1988   Right breast abcess  . BUNIONECTOMY  2007  . CHOLECYSTECTOMY  July 2010   done w/ Whipple procedure  . EYE SURGERY  2009   Bilateral cataract  . STAPLING OF BLEBS Left 04/20/2015   Procedure: STAPLING OF BLEBS;  Surgeon: Ivin Poot, MD;  Location: [place];  Service: Thoracic;  Laterality: Left;  . TONSILLECTOMY  1943  . VAGINAL HYSTERECTOMY  1963   Cervical Cancer  . VIDEO ASSISTED THORACOSCOPY Left 04/20/2015   Procedure: VIDEO ASSISTED THORACOSCOPY;  Surgeon: Ivin Poot, MD;  Location: [place];  Service: Thoracic;  Laterality: Left;  . WHIPPLE PROCEDURE  July 2010   pancreatic adenocarcinoma    OB History    No data available       Home Medications    Prior to Admission medications   Medication Sig Start Date End Date Taking? Authorizing Provider  amoxicillin (AMOXIL) 500 MG capsule  01/26/16   Historical Provider, MD  CREON 36000 units CPEP capsule TAKE 1 CAPSULE THREE TIMES A DAY WITH MEALS 03/01/16   Patrici Ranks, MD  diphenoxylate-atropine (LOMOTIL) 2.5-0.025 MG tablet Take 1 tablet by mouth 4 (four) times daily as needed for diarrhea or loose stools. 10/14/15   Patrici Ranks, MD  fluorouracil CALGB 81017 in sodium chloride 0.9 % 150 mL Inject into the vein. To be given every 14 days over  46 hours.    Historical Provider, MD  hydrochlorothiazide (HYDRODIURIL) 25 MG tablet Take by mouth.    Historical Provider, MD  ibuprofen (ADVIL,MOTRIN) 200 MG tablet Take 400 mg by mouth every 6 (six) hours as needed.    Historical Provider, MD  IRINOTECAN HCL IV Inject into the vein. To be given every 14 days.    Historical Provider, MD  leucovorin in dextrose 5 % 250 mL Inject into the vein once. To be given every 14 days    Historical Provider, MD  levofloxacin (LEVAQUIN) 250 MG tablet Take 2 tablets on day 1 then one tablet daily thereafter until gone  12/22/15   Patrici Ranks, MD  lidocaine-prilocaine (EMLA) cream Apply a quarter size amount to port site 1 hour prior to chemo. Do not rub in. Cover with plastic wrap. 10/14/15   Patrici Ranks, MD  lipase/protease/amylase (CREON) 36000 UNITS CPEP capsule Take by mouth. 04/30/15   Historical Provider, MD  loperamide (IMODIUM A-D) 2 MG tablet At the onset of diarrhea, take 2 tabs. Then take 1 tab every 2 hours until you have gone 12 hours without having a loose stool. If it is bedtime and you have a loose stool(s) take 2 tabs every 4 hours until morning. Call [place]. 10/14/15   Patrici Ranks, MD  loperamide (IMODIUM) 2 MG capsule  10/14/15   Historical Provider, MD  losartan (COZAAR) 100 MG tablet  08/31/15   Historical Provider, MD  metoprolol succinate (TOPROL-XL) 50 MG 24 hr tablet Take 25 mg by mouth daily before breakfast. Take with or immediately following a meal.    Historical Provider, MD  nystatin cream (MYCOSTATIN)  09/23/15   Historical Provider, MD  omeprazole (PRILOSEC) 20 MG capsule Take 20 mg by mouth daily.     Historical Provider, MD  ondansetron (ZOFRAN) 8 MG tablet Take 1 tablet (8 mg total) by mouth every 8 (eight) hours as needed for nausea or vomiting. 10/14/15   Patrici Ranks, MD  Potassium Bicarb-Citric Acid (EFFER-K) 10 MEQ TBEF Take 1 tablet (10 mEq total) by mouth 2 (two) times daily. 12/08/15   Patrici Ranks, MD  potassium chloride SA (K-DUR,KLOR-CON) 20 MEQ tablet Take by mouth.    Historical Provider, MD  prochlorperazine (COMPAZINE) 10 MG tablet Take 1 tablet (10 mg total) by mouth every 6 (six) hours as needed for nausea or vomiting. 10/14/15   Patrici Ranks, MD  XARELTO 20 MG TABS tablet  08/04/15   Historical Provider, MD    Family History Family History  Problem Relation Age of Onset  . Pancreatic cancer Brother   . Colon cancer Sister 22    12/2006 dx surgery and chemotherapy  . Ovarian cancer Sister 23    Ovarian ca,  surgery and  chemotherapy    Social History Social History  Substance Use Topics  . Smoking status: Former Smoker    Packs/day: 1.00    Years: 30.00    Types: Cigarettes    Quit date: 02/29/1996  . Smokeless tobacco: Never Used  . Alcohol use No     Allergies   Oxaliplatin and Aspirin   Review of Systems Review of Systems  Constitutional: Negative for chills and fever.  HENT: Negative for ear pain and sore throat.   Eyes: Negative for pain and visual disturbance.  Respiratory: Positive for shortness of breath. Negative for cough and chest tightness.   Cardiovascular: Negative for chest pain and palpitations.  Gastrointestinal: Negative for abdominal  pain and vomiting.  Genitourinary: Negative for dysuria and hematuria.  Musculoskeletal: Negative for arthralgias and back pain.  Skin: Negative for color change and rash.  Neurological: Negative for seizures and syncope.  All other systems reviewed and are negative.    Physical Exam Updated Vital Signs Ht '5\' 2"'$  (1.575 m)   Wt 56.2 kg   SpO2 96%   BMI 22.68 kg/m   Physical Exam  Constitutional: She appears cachectic. She does not appear ill. No distress. Nasal cannula in place.  HENT:  Head: Normocephalic and atraumatic.  Eyes: Conjunctivae are normal.  Neck: Neck supple.  Cardiovascular: Normal rate, regular rhythm, S1 normal, S2 normal, normal heart sounds, intact distal pulses and normal pulses.   No murmur heard. Pulmonary/Chest: Accessory muscle usage present. No respiratory distress. She has decreased breath sounds in the right upper field and the right middle field. She has no wheezes. She has no rhonchi.  Abdominal: Soft. There is no tenderness. A hernia is present. Hernia confirmed positive in the ventral area.  Musculoskeletal: She exhibits no edema.  Neurological: She is alert.  Skin: Skin is warm and dry.  Psychiatric: She has a normal mood and affect.  Nursing note and vitals reviewed.    ED Treatments / Results   Labs (all labs ordered are listed, but only abnormal results are displayed) Labs Reviewed - No data to display  EKG  EKG Interpretation  Date/Time:  Saturday March 05 2016 18:05:13 EST Ventricular Rate:  80 PR Interval:    QRS Duration: 87 QT Interval:  373 QTC Calculation: 431 R Axis:   -26 Text Interpretation:  Sinus rhythm Borderline left axis deviation Abnormal R-wave progression, early transition No STEMI.  Confirmed by LONG MD, JOSHUA [phone]) on 03/05/2016 6:07:44 PM       Radiology No results found.  Procedures Procedures (including critical care time)  Medications Ordered in ED Medications - No data to display   Initial Impression / Assessment and Plan / ED Course  I have reviewed the triage vital signs and the nursing notes.  Pertinent labs & imaging results that were available during my care of the patient were reviewed by me and considered in my medical decision making (see chart for details).  Clinical Course   76 year old female transferred from [facility] for any pneumothorax. She has a history of pancreatic cancer with metastases to the lungs who's had spontaneous pneumothoraces in the past. She states that she woke MORE and feeling more short of breath that gradually worsened throughout the day. CT at outside hospital showed a 40-50% right-sided pneumothorax. On arrival she is afebrile and hemodynamically stable. She has mildly increased work of breathing but appears comfortable. She is in the low 90s on room air therefore was placed on 2 L oxygen. She is not tachycardic or hypotensive. A&O 4.  Image sent from [facility] uploaded radiology. Dr. Cyndia Bent with Cardiothoracic surgery consultation for spontaneous pneumothorax.  Dr. Cyndia Bent placed a chest tube in the R chest and will admit for further management. Pt remained HDS while in the ED.  Patient care discussed and supervised by my attending, Dr. Laverta Baltimore. Drucie Ip, MD   Final Clinical Impressions(s) / ED  Diagnoses   Final diagnoses:  Chest tube in place    New Prescriptions New Prescriptions   No medications on file     Yasin Ducat Mal[place] Lakeia Bradshaw, MD 03/05/16 [place], MD 03/06/16 [phone]

## 2016-03-05 NOTE — ED Triage Notes (Signed)
Pt arrives via [facility] for pneumothorax. Pt was seen in [place]. Pt has a hx of pancreatic CA with mets to the lungs. Pt states she has SOB since this AM. O2 is @ 2L and sats are 96%.

## 2016-03-05 NOTE — H&P (Signed)
[address]       ,[place] [id number]             [phone]      Cardiothoracic Surgery Admission History and Physical   [name] [name] is an 76 y.o. female.   Chief Complaint: Spontaneous right pneumothorax HPI:   The patient is a 76 year old woman with stage IV pancreatic cancer s/p Whipple in 08/2008 with postop chemotherapy. She subsequently had wedge resection of  lung mets from the right upper and lower lobes by Dr. Arlyce Dice in 08/2009. She then had a left VATS with resection of a LLL nodule by Dr. Arlyce Dice in 05/2010. Pathology felt to be lung primary and not pancreatic. She had SBRT of a LUL lesion in 08/2011 followed by chemotherapy in 2014 and 2015. In 03/2015 she had a left chest tube placed by Dr. Darcey Nora for spontaneous ptx and then underwent left VATS, mini-thoracotomy and stapling of blebs, talc pleurodesis for persistent air leak on 04/20/2015. She started more chemo in August 2017 after imaging showed progression of disease in the lungs, left fifth rib. Her most recent imaging in November 2017 showed the cavitary lung mets to be stable to decreased in size with no new mets in the chest, abdomen or pelvis. She is followed by Dr. Whitney Muse. She lives at home by herself and takes care of herself and her house. Her sister lives nearby and is with her a lot. She reports getting up this morning with pain in the right upper chest that felt like a gas bubble and some SOB. She took some antacids without relief and went to [facility] where CXR and CT showed a large right ptx. She requested transfer here.  Past Medical History:  Diagnosis Date  . Abdominal wall hernia   . Aortic aneurysm (HCC)    small aortic aneurysm  . Cancer [facility]) July 2010   Pancreatic adenocarcinoma  . Cataract 2010   b/l surgery  . Cervical cancer ([place]) 1963  . COPD (chronic obstructive pulmonary disease) ([place])   . Emphysema   . Goals of care, counseling/discussion 02/02/2016  .  Hypercholesterolemia   . Hypertension   . Pancreatic cancer metastasized to lung [facility]) 09/04/2015    Past Surgical History:  Procedure Laterality Date  . BREAST SURGERY  1988   Right breast abcess  . BUNIONECTOMY  2007  . CHOLECYSTECTOMY  July 2010   done w/ Whipple procedure  . EYE SURGERY  2009   Bilateral cataract  . STAPLING OF BLEBS Left 04/20/2015   Procedure: STAPLING OF BLEBS;  Surgeon: Ivin Poot, MD;  Location: [place];  Service: Thoracic;  Laterality: Left;  . TONSILLECTOMY  1943  . VAGINAL HYSTERECTOMY  1963   Cervical Cancer  . VIDEO ASSISTED THORACOSCOPY Left 04/20/2015   Procedure: VIDEO ASSISTED THORACOSCOPY;  Surgeon: Ivin Poot, MD;  Location: [place];  Service: Thoracic;  Laterality: Left;  . WHIPPLE PROCEDURE  July 2010   pancreatic adenocarcinoma    Family History  Problem Relation Age of Onset  . Pancreatic cancer Brother   . Colon cancer Sister 87    12/2006 dx surgery and chemotherapy  . Ovarian cancer Sister 53    Ovarian ca,  surgery and chemotherapy   Social History:  reports that she quit smoking about 20 years ago. Her smoking use included Cigarettes. She has a 30.00 pack-year smoking history. She has never used smokeless tobacco. She reports that  she does not drink alcohol or use drugs.  Allergies:  Allergies  Allergen Reactions  . Oxaliplatin Anaphylaxis  . Aspirin Other (See Comments)    Burns stomach     (Not in a hospital admission)  No results found for this or any previous visit (from the past 48 hour(s)). No results found.  Review of Systems  Constitutional: Positive for malaise/fatigue. Negative for chills and fever.  HENT: Negative.   Eyes: Negative.   Respiratory: Positive for cough and shortness of breath.        Pain in right shoulder and chest  Cardiovascular: Negative for palpitations and leg swelling.  Gastrointestinal:       Intermittent nausea and diarrhea  Genitourinary: Negative.   Musculoskeletal:  Negative.   Skin:       Dry red spots on face  Neurological: Negative.   Endo/Heme/Allergies: Negative.   Psychiatric/Behavioral: Negative.     Blood pressure 148/67, pulse 74, resp. rate 17, height '5\' 2"'$  (1.575 m), weight 56.2 kg (124 lb), SpO2 100 %. Physical Exam  Constitutional: She is oriented to person, place, and time.  Thin elderly woman in no distress  HENT:  Head: Normocephalic and atraumatic.  Mouth/Throat: Oropharynx is clear and moist.  Eyes: EOM are normal. Pupils are equal, round, and reactive to light.  Neck: Normal range of motion. Neck supple. No JVD present.  Cardiovascular: Normal rate, regular rhythm and normal heart sounds.   No murmur heard. Respiratory: Effort normal. No respiratory distress.  Decreased breath sounds on the right  GI: Soft. Bowel sounds are normal. She exhibits no distension. There is tenderness.  Large ventral incisional hernia in midline that is easily reducible.  Musculoskeletal: Normal range of motion. She exhibits no edema.  Lymphadenopathy:    She has no cervical adenopathy.  Neurological: She is alert and oriented to person, place, and time.  Skin: Skin is warm and dry.  Psychiatric: She has a normal mood and affect.    DG Chest Portable 1 View (Accession [id number]) (Order [id number])  Imaging  Date: 03/05/2016 Department: [place] [facility] Released By: Phoebe Sharps, RN (auto-released) Authorizing: Margette Fast, MD  Exam Information   Status Exam Begun  Exam Ended   Final [99] 03/05/2016 7:34 PM 03/05/2016 7:44 PM  PACS Images   Show images for DG Chest Portable 1 View  Study Result   CLINICAL DATA:  Right-sided chest tube.  EXAM: PORTABLE CHEST 1 VIEW  COMPARISON:  March 05, 2016  FINDINGS: A new right chest tube has been placed with the distal tip of the medial right upper chest. No pneumothorax identified. Persistent opacities in the left base. Decreased opacity in the  lateral right lung base. No other interval changes.  IMPRESSION: Right chest tube placement with resolution pneumothorax. No other significant change.   Electronically Signed   By: Dorise Bullion III M.D   On: 03/05/2016 19:48     Assessment/Plan  This very nice woman has stage IV pancreatic cancer with lung mets and has undergone previous right lung wedge resections in the past. She has also had a left lung resection for a nodule that was felt to be a lung primary in the past. She has been treated for a spontaneous ptx on the left in 03/2015 and required surgery for that to resolve the air leak. She now presents with a large spontaneous right ptx and this could be related to her cancer but she also has severe emphysematous changes  in the lungs. The ptx is resolved after chest tube placement and there is no active air leak at this time. She will be admitted for chest tube management.  Gaye Pollack, MD 03/05/2016, 7:43 PM

## 2016-03-05 NOTE — ED Provider Notes (Signed)
Spoke with Dr. Felton Clinton from [facility] ED. Patient with history of lung CA and new 50% pneumothorax on CXR. He spoke with cardiothoracic who recommends transfer to the [facility] ED for evaluation and chest tube placement. Patient vital signs are stable. I accepted the patient in transfer.   Nanda Quinton, MD   Margette Fast, MD 03/05/16 [phone]

## 2016-03-05 NOTE — ED Notes (Signed)
Pt and family made aware of bed assignment 

## 2016-03-05 NOTE — CV Procedure (Signed)
Chest Tube Insertion Procedure Note  Indications:  Clinically significant right Pneumothorax  Pre-operative Diagnosis: right Pneumothorax  Post-operative Diagnosis: right Pneumothorax  Procedure Details  Informed consent was obtained for the procedure, including sedation.  Risks of lung perforation, hemorrhage, arrhythmia, and adverse drug reaction were discussed.   After sterile skin prep, using standard technique, a 20 French tube was placed in the right lateral 7th rib space.  Findings: None  Estimated Blood Loss:  Minimal         Specimens:  None              Complications:  None; patient tolerated the procedure well.         Disposition: admit          Condition: stable  Attending Attestation: I performed the procedure.

## 2016-03-06 ENCOUNTER — Inpatient Hospital Stay (HOSPITAL_COMMUNITY): Payer: Medicare Other

## 2016-03-06 MED ORDER — IBUPROFEN 200 MG PO TABS
400.0000 mg | ORAL_TABLET | Freq: Four times a day (QID) | ORAL | Status: DC | PRN
  Administered 2016-03-06: 400 mg via ORAL
  Filled 2016-03-06: qty 2

## 2016-03-06 MED ORDER — LOSARTAN POTASSIUM 25 MG PO TABS
25.0000 mg | ORAL_TABLET | Freq: Every day | ORAL | Status: DC
  Administered 2016-03-06 – 2016-03-08 (×3): 25 mg via ORAL
  Filled 2016-03-06 (×3): qty 1

## 2016-03-06 MED ORDER — RIVAROXABAN 15 MG PO TABS
15.0000 mg | ORAL_TABLET | Freq: Every day | ORAL | Status: DC
  Administered 2016-03-06 – 2016-03-10 (×5): 15 mg via ORAL
  Filled 2016-03-06 (×5): qty 1

## 2016-03-06 MED ORDER — OMEPRAZOLE 20 MG PO CPDR
20.0000 mg | DELAYED_RELEASE_CAPSULE | Freq: Every day | ORAL | Status: DC
  Administered 2016-03-06 – 2016-03-11 (×6): 20 mg via ORAL
  Filled 2016-03-06 (×6): qty 1

## 2016-03-06 NOTE — Progress Notes (Addendum)
      [address]       [place] [address]             [phone]      Subjective:  [name] [name] is doing okay this morning. Her nausea has resolved.  Objective: Vital signs in last 24 hours: Temp:  [98.2 F (36.8 C)-98.6 F (37 C)] 98.2 F (36.8 C) (01/14 0444) Pulse Rate:  [59-74] 63 (01/14 0821) Cardiac Rhythm: Normal sinus rhythm (01/14 0717) Resp:  [12-26] 20 (01/14 0444) BP: (118-171)/(53-99) 142/69 (01/14 0821) SpO2:  [96 %-100 %] 100 % (01/13 2303) Weight:  [120 lb 14.4 oz (54.8 kg)-124 lb (56.2 kg)] 120 lb 14.4 oz (54.8 kg) (01/13 2303)  General appearance: alert, cooperative and no distress Heart: regular rate and rhythm Lungs: clear to auscultation bilaterally Abdomen: soft, non-tender; bowel sounds normal; no masses,  no organomegaly Wound: clean and dry  Lab Results:  Recent Labs  03/05/16 2049  WBC 4.6  HGB 9.6*  HCT 29.8*  PLT 173   BMET:  Recent Labs  03/05/16 2049  NA 137  K 4.3  CL 106  CO2 21*  GLUCOSE 121*  BUN 16  CREATININE 1.08*  CALCIUM 8.9    PT/INR: No results for input(s): LABPROT, INR in the last 72 hours. ABG    Component Value Date/Time   PHART 7.412 04/21/2015 0447   HCO3 27.9 (H) 04/21/2015 0447   TCO2 29 04/21/2015 0447   ACIDBASEDEF 0.2 04/19/2015 1856   O2SAT 98.0 04/21/2015 0447   CBG (last 3)  No results for input(s): GLUCAP in the last 72 hours.  Assessment/Plan:  1. S/P Chest tube placement for pneumothorax- no air leak, keep on suction today... CXR shows no pneumothorax 2. Pulm- no acute issues, Emphysema, continue IS 3. CV- NSR, HTN- will continue Toprol, restart home Cozaar at reduced dose 4. Pain control- no narcotics cause N/V, patient requests tylenol, ibuprofen 5. Dispo- patient stable, chest tube to suction, cozaar for HTN, CXR in AM   LOS: 1 day    BARRETT, ERIN 03/06/2016   Chart reviewed, patient examined, agree with above. Feels better and able to eat some today.    CXR shows no ptx and there is no air leak from the chest tube. Continue to suction today and if CXR tomorrow looks ok will put to water seal.

## 2016-03-07 ENCOUNTER — Inpatient Hospital Stay (HOSPITAL_COMMUNITY): Payer: Medicare Other

## 2016-03-07 MED ORDER — ACETAMINOPHEN 500 MG PO TABS
1000.0000 mg | ORAL_TABLET | Freq: Four times a day (QID) | ORAL | Status: DC | PRN
  Administered 2016-03-07 – 2016-03-10 (×6): 1000 mg via ORAL
  Filled 2016-03-07 (×6): qty 2

## 2016-03-07 MED ORDER — PROMETHAZINE HCL 25 MG/ML IJ SOLN
12.5000 mg | Freq: Four times a day (QID) | INTRAMUSCULAR | Status: DC | PRN
  Administered 2016-03-07: 12.5 mg via INTRAVENOUS
  Filled 2016-03-07: qty 1

## 2016-03-07 MED ORDER — KETOROLAC TROMETHAMINE 15 MG/ML IJ SOLN: 15.0000 mg | Freq: Four times a day (QID) | INTRAMUSCULAR | Status: DC | PRN

## 2016-03-07 NOTE — Progress Notes (Signed)
  Subjective:  Nauseous this am  Objective: Vital signs in last 24 hours: Temp:  [97.7 F (36.5 C)-98.3 F (36.8 C)] 97.7 F (36.5 C) (01/15 0442) Pulse Rate:  [58-65] 60 (01/15 0442) Cardiac Rhythm: Normal sinus rhythm (01/15 0715) Resp:  [20] 20 (01/15 0442) BP: (138-150)/(56-69) 150/61 (01/15 0442) SpO2:  [95 %-100 %] 95 % (01/15 0442)  Hemodynamic parameters for last 24 hours:    Intake/Output from previous day: 01/14 0701 - 01/15 0700 In: 120 [P.O.:120] Out: -  Intake/Output this shift: No intake/output data recorded.  General appearance: alert and cooperative Heart: regular rate and rhythm, S1, S2 normal, no murmur, click, rub or gallop Lungs: clear to auscultation bilaterally Abdomen: soft, non-tender; bowel sounds normal; no masses,  no organomegaly chest tube has no air leak  Lab Results:  Recent Labs  03/05/16 2049  WBC 4.6  HGB 9.6*  HCT 29.8*  PLT 173   BMET:  Recent Labs  03/05/16 2049  NA 137  K 4.3  CL 106  CO2 21*  GLUCOSE 121*  BUN 16  CREATININE 1.08*  CALCIUM 8.9    PT/INR: No results for input(s): LABPROT, INR in the last 72 hours. ABG    Component Value Date/Time   PHART 7.412 04/21/2015 0447   HCO3 27.9 (H) 04/21/2015 0447   TCO2 29 04/21/2015 0447   ACIDBASEDEF 0.2 04/19/2015 1856   O2SAT 98.0 04/21/2015 0447   CBG (last 3)  No results for input(s): GLUCAP in the last 72 hours.  Assessment/Plan:  There is no air leak from the chest tube and CXR ok so will put to water seal and get 2 view in am.  She has nausea this am after feeling well yesterday. I suspect it is related to pain meds. Will DC ibuprofen and narcotics and use tylenol.   Continue some IVF since not taking much po.  LOS: 2 days    Gaye Pollack 03/07/2016

## 2016-03-08 ENCOUNTER — Inpatient Hospital Stay (HOSPITAL_COMMUNITY): Payer: Medicare Other

## 2016-03-08 NOTE — Progress Notes (Addendum)
      [address]       [place] [address]             [phone]         Subjective: Feels well  Objective: Vital signs in last 24 hours: Temp:  [97.5 F (36.4 C)-98 F (36.7 C)] 97.6 F (36.4 C) (01/16 0527) Pulse Rate:  [61-72] 61 (01/16 0527) Cardiac Rhythm: Normal sinus rhythm (01/15 1903) Resp:  [18-20] 18 (01/16 0527) BP: (145-167)/(60-70) 159/70 (01/16 0527) SpO2:  [96 %-100 %] 96 % (01/16 0527)  Hemodynamic parameters for last 24 hours:    Intake/Output from previous day: 01/15 0701 - 01/16 0700 In: 2588.8 [P.O.:120; I.V.:2468.8] Out: 1 [Urine:1] Intake/Output this shift: No intake/output data recorded.  General appearance: alert, cooperative and no distress Heart: regular rate and rhythm Lungs: somewhat coarse Abdomen: benign Extremities: no edema  Lab Results:  Recent Labs  03/05/16 2049  WBC 4.6  HGB 9.6*  HCT 29.8*  PLT 173   BMET:  Recent Labs  03/05/16 2049  NA 137  K 4.3  CL 106  CO2 21*  GLUCOSE 121*  BUN 16  CREATININE 1.08*  CALCIUM 8.9    PT/INR: No results for input(s): LABPROT, INR in the last 72 hours. ABG    Component Value Date/Time   PHART 7.412 04/21/2015 0447   HCO3 27.9 (H) 04/21/2015 0447   TCO2 29 04/21/2015 0447   ACIDBASEDEF 0.2 04/19/2015 1856   O2SAT 98.0 04/21/2015 0447   CBG (last 3)  No results for input(s): GLUCAP in the last 72 hours.  Meds Scheduled Meds: . lipase/protease/amylase  36,000 Units Oral TID WC  . losartan  25 mg Oral Daily  . metoprolol succinate  25 mg Oral QAC breakfast  . omeprazole  20 mg Oral Daily  . rivaroxaban  15 mg Oral Q supper  . sodium chloride flush  3 mL Intravenous Q12H  . sodium chloride flush  3 mL Intravenous Q12H   Continuous Infusions: . dextrose 5 % and 0.9% NaCl 50 mL/hr at 03/07/16 2031   PRN Meds:.sodium chloride, acetaminophen, ondansetron **OR** ondansetron (ZOFRAN) IV, prochlorperazine, promethazine, sodium chloride flush, sodium  chloride flush  Xrays Dg Chest Port 1 View  Result Date: 03/07/2016 CLINICAL DATA:  Pneumothorax, nausea Hx of cancer, HTN, COPD, aortic aneurysm EXAM: PORTABLE CHEST 1 VIEW COMPARISON:  03/06/2016 FINDINGS: Right-sided Port-A-Cath terminates at the low SVC. Right-sided chest tube remains in place. Patient rotated minimally right. Normal heart size. Trace left pleural fluid or thickening. Suspect minimal super lateral right-sided pleural air. Left greater than right base airspace disease is similar. Underlying hyperinflation indicative of COPD. IMPRESSION: Right-sided chest tube in place with possible small volume superolateral pneumothorax. Otherwise, similar appearance of COPD/hyperinflation and bibasilar airspace opacities. Electronically Signed   By: Abigail Miyamoto M.D.   On: 03/07/2016 08:10    Assessment/Plan:  1 doing well 2 no air leak, CXR is pending, currently on water seal 3 may be having some afib, intermittent, currently sinus, cont beta blocker   LOS: 3 days    GOLD,WAYNE E 03/08/2016   Chart reviewed, patient examined, agree with above. CXR today shows small hydropneumothorax on water seal. This afternoon there is a moderate air leak with coughing. Will continue to water seal and repeat CXR in the am. She may require VATS to get this to stop. Discussed with patient and her sister.

## 2016-03-09 ENCOUNTER — Inpatient Hospital Stay (HOSPITAL_COMMUNITY): Payer: Medicare Other

## 2016-03-09 MED ORDER — LOSARTAN POTASSIUM 50 MG PO TABS
50.0000 mg | ORAL_TABLET | Freq: Every day | ORAL | Status: DC
  Administered 2016-03-09 – 2016-03-11 (×3): 50 mg via ORAL
  Filled 2016-03-09 (×3): qty 1

## 2016-03-09 NOTE — Care Management Note (Signed)
Case Management Note Marvetta Gibbons RN, BSN Unit 2W-Case Manager [phone]  Patient Details  Name: [name] [name] MRN: [id number] Date of Birth: Jul 14, 1940  Subjective/Objective:    Pt admitted with pntx- chest tube placed                Action/Plan: PTA pt lived at home, has supportive sister, anticipate return home- CM to follow.   Expected Discharge Date:                  Expected Discharge Plan:  Home/Self Care  In-House Referral:     Discharge planning Services  CM Consult  Post Acute Care Choice:    Choice offered to:     DME Arranged:    DME Agency:     HH Arranged:    HH Agency:     Status of Service:  In process, will continue to follow  If discussed at Long Length of Stay Meetings, dates discussed:    Additional Comments:  Dawayne Patricia, RN 03/09/2016, 11:00 AM

## 2016-03-09 NOTE — Progress Notes (Addendum)
      [address]       [name] [address]             [phone]     Subjective:   [name] [name] has no complaints. She has no further nausea.  Objective: Vital signs in last 24 hours: Temp:  [97.8 F (36.6 C)-98.4 F (36.9 C)] 98.4 F (36.9 C) (01/17 0455) Pulse Rate:  [53-76] 59 (01/17 0455) Cardiac Rhythm: Sinus bradycardia (01/16 1900) Resp:  [16-18] 16 (01/17 0455) BP: (148-162)/(68-76) 152/69 (01/17 0455) SpO2:  [95 %-100 %] 95 % (01/17 0455)  Intake/Output from previous day: 01/16 0701 - 01/17 0700 In: 1330 [P.O.:720; I.V.:610] Out: 11 [Urine:1; Chest Tube:10]  General appearance: alert and cooperative Heart: regular rate and rhythm Lungs: clear to auscultation bilaterally Abdomen: soft, non-tender; bowel sounds normal; no masses,  no organomegaly Wound: clean and dry  Lab Results: No results for input(s): WBC, HGB, HCT, PLT in the last 72 hours. BMET: No results for input(s): NA, K, CL, CO2, GLUCOSE, BUN, CREATININE, CALCIUM in the last 72 hours.  PT/INR: No results for input(s): LABPROT, INR in the last 72 hours. ABG    Component Value Date/Time   PHART 7.412 04/21/2015 0447   HCO3 27.9 (H) 04/21/2015 0447   TCO2 29 04/21/2015 0447   ACIDBASEDEF 0.2 04/19/2015 1856   O2SAT 98.0 04/21/2015 0447   CBG (last 3)  No results for input(s): GLUCAP in the last 72 hours.  Assessment/Plan:  1. Chest tube- no air leak present, leave chest tube on water seal 2. CV- Sinus Brady- on Toprol XL, Cozaar 50 mg daily 3. Dispo- increase Cozaar for HTN, no air leak present, leave chest tube on water seal  LOS: 4 days    [name] [name] 03/09/2016   Chart reviewed, patient examined, agree with above. There is no air leak today and minimal tidaling. The CXR shows resolution of the pneumo seen yesterday. I suspect that whatever is leaking is opening and closing. I would keep CT to water seal and hopefully this leak will seal permanently although I am  skeptical given the appearance of her lungs on CT. She is not a great surgical candidate although I would do it if needed. Discussed plans with her and sister.

## 2016-03-10 ENCOUNTER — Inpatient Hospital Stay (HOSPITAL_COMMUNITY): Payer: Medicare Other

## 2016-03-10 ENCOUNTER — Encounter (HOSPITAL_COMMUNITY): Payer: Self-pay | Admitting: General Practice

## 2016-03-10 MED ORDER — ENSURE ENLIVE PO LIQD
237.0000 mL | Freq: Two times a day (BID) | ORAL | Status: DC
  Administered 2016-03-10: 237 mL via ORAL

## 2016-03-10 NOTE — Discharge Summary (Signed)
Physician Discharge Summary  Patient ID: [name] [name] MRN: [id number] DOB/AGE: [birth year] 76 y.o.  Admit date: 03/05/2016 Discharge date: 03/11/2016  Admission Diagnoses:  Patient Active Problem List   Diagnosis Date Noted  . Pneumothorax 03/05/2016  . Goals of care, counseling/discussion 02/02/2016  . Low serum vitamin B12 01/20/2016  . Low blood potassium 12/08/2015  . Pancreatic cancer metastasized to lung ([facility]) 09/04/2015  . Pneumothorax, left 04/06/2015  . Cervical cancer ([facility])   . Emphysema   . Hypertension   . Lung cancer ([facility]) 06/11/2010  . Cancer ([facility]) 08/21/2008   Discharge Diagnoses:   Patient Active Problem List   Diagnosis Date Noted  . Pneumothorax 03/05/2016  . Goals of care, counseling/discussion 02/02/2016  . Low serum vitamin B12 01/20/2016  . Low blood potassium 12/08/2015  . Pancreatic cancer metastasized to lung ([facility]) 09/04/2015  . Pneumothorax, left 04/06/2015  . Cervical cancer ([facility])   . Emphysema   . Hypertension   . Lung cancer ([facility]) 06/11/2010  . Cancer ([place]) 08/21/2008   Discharged Condition: good  History of Present Illness:  [name] [name] is a 76 yo white female stage IV pancreatic cancer s/p [place] in 08/2008 with postop chemotherapy. She subsequently had wedge resection of  lung mets from the right upper and lower lobes by Dr. Arlyce Dice in 08/2009. She then had a left VATS with resection of a LLL nodule by Dr. Arlyce Dice in 05/2010. Pathology felt to be lung primary and not pancreatic. She had SBRT of a LUL lesion in 08/2011 followed by chemotherapy in 2014 and 2015. In 03/2015 she had a left chest tube placed by Dr. Darcey Nora for spontaneous ptx and then underwent left VATS, mini-thoracotomy and stapling of blebs, talc pleurodesis for persistent air leak on 04/20/2015. She started more chemo in August 2017 after imaging showed progression of disease in the lungs, left fifth rib. Her most recent imaging in November 2017 showed the cavitary lung mets to be  stable to decreased in size with no new mets in the chest, abdomen or pelvis.  She presented to OSH with complaints of pain in the right upper chest that felt like a gas bubble and some SOB. She took some antacids without relief and went to [facility] ER where CXR and CT showed a large right ptx.  She requested transfer to [facility] for further care.  Hospital Course:   She was admitted to the hospital by Dr. Cyndia Bent.  She underwent Right Chest Tube placement.  Follow up CXR shows resolution of pneumothorax.  She suffered nausea with use of narcotic medication.  This resolved with discontinuation of narcotic pain medication.  Follow up CXR continued to show no pneumothorax.  Her chest tube was transitioned to water seal on HD #2.  Follow up CXR showed a hydropneumothorax.  She also exhibited evidence of an air leak and her chest tube was left on water seal.  This resolved and CXR remained stable.  Her chest tube was removed on HD #5.  Repeat CXR was obtained and showed no pneumothorax.  The patient feels great and is medically stable for discharge today.      Treatments: chest tube placement  Disposition: 01-Home or Self Care   Discharge Medications:   Allergies as of 03/11/2016      Reactions   Oxaliplatin Anaphylaxis   Aspirin Other (See Comments)   Burns stomach   Pantoprazole Other (See Comments)   THIS GIVES THE PATIENT TERRIBLE HEARTBURN (PLEASE DO NOT GIVE)  Medication List    TAKE these medications   acetaminophen 500 MG tablet Commonly known as:  TYLENOL Take 2 tablets (1,000 mg total) by mouth every 6 (six) hours as needed for mild pain.   amoxicillin 500 MG capsule Commonly known as:  AMOXIL Take 2,000 mg by mouth See admin instructions. One hour prior to dental appt (as a pre-treatment)   CREON 36000 UNITS Cpep capsule Generic drug:  lipase/protease/amylase TAKE 1 CAPSULE THREE TIMES A DAY WITH MEALS   diphenoxylate-atropine 2.5-0.025 MG tablet Commonly known as:   LOMOTIL Take 1 tablet by mouth 4 (four) times daily as needed for diarrhea or loose stools.   fluorouracil CALGB 16109 in sodium chloride 0.9 % 150 mL Inject into the vein. To be given every 14 days over 46 hours.   hydrochlorothiazide 25 MG tablet Commonly known as:  HYDRODIURIL Take 12.5 mg by mouth every morning.   ibuprofen 200 MG tablet Commonly known as:  ADVIL,MOTRIN Take 200 mg by mouth every 6 (six) hours as needed for headache.   IRINOTECAN HCL IV Inject into the vein. To be given every 14 days.   leucovorin in dextrose 5 % 250 mL Inject into the vein once. To be given every 14 days   levofloxacin 250 MG tablet Commonly known as:  LEVAQUIN Take 2 tablets on day 1 then one tablet daily thereafter until gone   lidocaine-prilocaine cream Commonly known as:  EMLA Apply a quarter size amount to port site 1 hour prior to chemo. Do not rub in. Cover with plastic wrap.   loperamide 2 MG tablet Commonly known as:  IMODIUM A-D At the onset of diarrhea, take 2 tabs. Then take 1 tab every 2 hours until you have gone 12 hours without having a loose stool. If it is bedtime and you have a loose stool(s) take 2 tabs every 4 hours until morning. Call [facility].   losartan 100 MG tablet Commonly known as:  COZAAR Take 100 mg by mouth every morning.   metoprolol succinate 50 MG 24 hr tablet Commonly known as:  TOPROL-XL Take 25 mg by mouth every morning. Take with or immediately following a meal.   omeprazole 20 MG capsule Commonly known as:  PRILOSEC Take 20 mg by mouth daily before breakfast.   ondansetron 8 MG tablet Commonly known as:  ZOFRAN Take 1 tablet (8 mg total) by mouth every 8 (eight) hours as needed for nausea or vomiting.   Potassium Bicarb-Citric Acid 10 MEQ Tbef Commonly known as:  EFFER-K Take 1 tablet (10 mEq total) by mouth 2 (two) times daily.   prochlorperazine 10 MG tablet Commonly known as:  COMPAZINE Take 1 tablet (10 mg total) by mouth every  6 (six) hours as needed for nausea or vomiting.   XARELTO 20 MG Tabs tablet Generic drug:  rivaroxaban Take 20 mg by mouth daily with supper.      Follow-up Information    Gaye Pollack, MD Follow up on 03/23/2016.   Specialty:  Cardiothoracic Surgery Why:  Appointment is at 3:00, please get CXR at 2:30 at [facility] located on first floor of our office building Contact information: [address] [address] [phone]           Signed: Ellwood Handler 03/11/2016, 8:10 AM

## 2016-03-10 NOTE — Discharge Instructions (Signed)
Pneumothorax Introduction A pneumothorax, commonly called a collapsed lung, is a condition in which air leaks from a lung and builds up in the space between the lung and the chest wall (pleural space). The air in a pneumothorax is trapped outside the lung and takes up space, preventing the lung from fully expanding. This is a condition that usually occurs suddenly. The buildup of air may be small or large. A small pneumothorax may go away on its own. When a pneumothorax is larger, it will often require medical treatment and hospitalization. What are the causes? A pneumothorax can sometimes happen quickly with no apparent cause. People with underlying lung problems, particularly COPD or emphysema, are at higher risk of pneumothorax. However, pneumothorax can happen quickly even in people with no prior known lung problems. Trauma, surgery, medical procedures, or injury to the chest wall can also cause a pneumothorax. What are the signs or symptoms? Sometimes a pneumothorax will have no symptoms. When symptoms are present, they can include:  Chest pain.  Shortness of breath.  Increased rate of breathing.  Bluish color to your lips or skin (cyanosis). How is this diagnosed? Pneumothorax is usually diagnosed by a chest X-ray or chest CT scan. Your health care provider will also take a medical history and perform a physical exam to determine why you may have a pneumothorax. How is this treated? A small pneumothorax may go away on its own without treatment. Extra oxygen can sometimes help a small pneumothorax go away more quickly. For a larger pneumothorax or a pneumothorax that is causing symptoms, a procedure is usually needed to drain the air.In some cases, the health care provider may drain the air using a needle. In other cases, a chest tube may be inserted into the pleural space. A chest tube is a small tube placed between the ribs and into the pleural space. This removes the extra air and allows  the lung to expand back to its normal size. A large pneumothorax will usually require a hospital stay. If there is ongoing air leakage into the pleural space, then the chest tube may need to remain in place for several days until the air leak has healed. In some cases, surgery may be needed. Follow these instructions at home:  Only take over-the-counter or prescription medicines as directed by your health care provider.  If a cough or pain makes it difficult for you to sleep at night, try sleeping in a semi-upright position in a recliner or by using 2 or 3 pillows.  Rest and limit activity as directed by your health care provider.  If you had a chest tube and it was removed, ask your health care provider when it is okay to remove the dressing. Until your health care provider says you can remove the dressing, do not allow it to get wet.  Do not smoke. Smoking is a risk factor for pneumothorax.  Do not fly in an airplane or scuba dive until your health care provider says it is okay.  Follow up with your health care provider as directed. Get help right away if:  You have increasing chest pain or shortness of breath.  You have a cough that is not controlled with suppressants.  You begin coughing up blood.  You have pain that is getting worse or is not controlled with medicines.  You cough up thick, discolored mucus (sputum) that is yellow to green in color.  You have redness, increasing pain, or discharge at the site where a  chest tube had been in place (if your pneumothorax was treated with a chest tube).  The site where your chest tube was located opens up.  You feel air coming out of the site where the chest tube was placed.  You have a fever or persistent symptoms for more than 2-3 days.  You have a fever and your symptoms suddenly get worse. This information is not intended to replace advice given to you by your health care provider. Make sure you discuss any questions you have  with your health care provider. Document Released: 02/07/2005 Document Revised: 07/16/2015 Document Reviewed: 07/03/2013  2017 Elsevier

## 2016-03-10 NOTE — Progress Notes (Signed)
      [address]       [place] [address]             [phone]     CARDIOTHORACIC SURGERY PROGRESS NOTE  Subjective: Soreness around chest tube site. Otherwise feeling well.  Objective: Vital signs in last 24 hours: Temp:  [97.6 F (36.4 C)-98 F (36.7 C)] 97.6 F (36.4 C) (01/18 0430) Pulse Rate:  [55-71] 55 (01/18 0430) Cardiac Rhythm: Normal sinus rhythm (01/17 1900) Resp:  [16-18] 16 (01/18 0430) BP: (131-156)/(62-71) 131/62 (01/18 0430) SpO2:  [98 %-100 %] 98 % (01/18 0430)  Physical Exam:  General:   Well appearing, alert and oriented  Breath sounds: CTAB  Heart sounds:  Regular rhythm, bradycardia. No murmur, gallop or rub on auscultation.  Incisions:  Clean and dry  Abdomen:  Soft and nontender, normoactive bowel sounds  Extremities:  Warm and no edema   Intake/Output from previous day: 01/17 0701 - 01/18 0700 In: 480 [P.O.:480] Out: 651 [Urine:650; Stool:1] Intake/Output this shift: No intake/output data recorded.  Lab Results: No results for input(s): WBC, HGB, HCT, PLT in the last 72 hours. BMET: No results for input(s): NA, K, CL, CO2, GLUCOSE, BUN, CREATININE, CALCIUM in the last 72 hours.  CBG (last 3)  No results for input(s): GLUCAP in the last 72 hours. PT/INR:  No results for input(s): LABPROT, INR in the last 72 hours.  CXR:   PORTABLE CHEST 1 VIEW  COMPARISON:  03/09/2016  FINDINGS: Right chest tube remains in place. No pneumothorax. Port-A-Cath tip in the SVC  Left lower lobe airspace disease unchanged. Negative for heart failure. Mild pleural scarring or pleural effusion bilaterally.  IMPRESSION: No pneumothorax.  Mild left lower lobe airspace disease unchanged.   Electronically Signed   By: Franchot Gallo M.D.   On: 03/10/2016 07:09  Assessment/Plan:    1 Chest tube - no air leak or tidaling, no more output. Can pull tube and check CXR, repeat CXR tomorrow morning as well 2 CV - sinus  bradycardia - on Toprol XL, Cozaar 50 mg 3 If CXR looks good tomorrow morning patient can be discharged.   Kelly Rayburn, PA-S 03/10/2016 7:52 AM  CXR shows no ptx and there is no air leak or tidaling. Will remove chest tube today.

## 2016-03-10 NOTE — Progress Notes (Signed)
Per physician orders, the chest tube has been removed. Patient tolerated it well. She has been instructed to remain in bed until her chest x-ray has been done. Will continue to monitor.

## 2016-03-11 ENCOUNTER — Inpatient Hospital Stay (HOSPITAL_COMMUNITY): Payer: Medicare Other

## 2016-03-11 MED ORDER — ACETAMINOPHEN 500 MG PO TABS: 1000.0000 mg | ORAL_TABLET | Freq: Four times a day (QID) | ORAL | 0 refills | Status: DC | PRN

## 2016-03-11 MED ORDER — HEPARIN SOD (PORK) LOCK FLUSH 100 UNIT/ML IV SOLN
500.0000 [IU] | INTRAVENOUS | Status: AC | PRN
  Administered 2016-03-11: 500 [IU]

## 2016-03-11 NOTE — Progress Notes (Signed)
      [address]       [place] [address]             [phone]     Subjective:  Ms. Benavidez has no complaints.  Feels great and is ready to go home.  Objective: Vital signs in last 24 hours: Temp:  [97.5 F (36.4 C)-98.1 F (36.7 C)] 98.1 F (36.7 C) (01/19 0417) Pulse Rate:  [55-63] 63 (01/19 0417) Cardiac Rhythm: Normal sinus rhythm (01/18 1900) Resp:  [16-20] 16 (01/19 0417) BP: (128-160)/(53-60) 137/58 (01/19 0417) SpO2:  [99 %-100 %] 99 % (01/19 0417)  Intake/Output from previous day: 01/18 0701 - 01/19 0700 In: 720 [P.O.:720] Out: -   General appearance: alert, cooperative and no distress Heart: regular rate and rhythm Lungs: clear to auscultation bilaterally Abdomen: soft, non-tender; bowel sounds normal; no masses,  no organomegaly Wound: clean and dry  Lab Results: No results for input(s): WBC, HGB, HCT, PLT in the last 72 hours. BMET: No results for input(s): NA, K, CL, CO2, GLUCOSE, BUN, CREATININE, CALCIUM in the last 72 hours.  PT/INR: No results for input(s): LABPROT, INR in the last 72 hours. ABG    Component Value Date/Time   PHART 7.412 04/21/2015 0447   HCO3 27.9 (H) 04/21/2015 0447   TCO2 29 04/21/2015 0447   ACIDBASEDEF 0.2 04/19/2015 1856   O2SAT 98.0 04/21/2015 0447   CBG (last 3)  No results for input(s): GLUCAP in the last 72 hours.  Assessment/Plan:  1. Chest tube- no pneumothorax on follow up CXR 2. CV-continue Lopressor, home Cozaar 3. Dispo- patient stable, will d/c home today   LOS: 6 days    Tmya Wigington 03/11/2016

## 2016-03-11 NOTE — Care Management Note (Signed)
Case Management Note Marvetta Gibbons RN, BSN Unit 2W-Case Manager [phone]  Patient Details  Name: [name] [name] MRN: [id number] Date of Birth: Dec 29, 1940  Subjective/Objective:    Pt admitted with pntx- chest tube placed                Action/Plan: PTA pt lived at home, has supportive sister, anticipate return home- CM to follow.   Expected Discharge Date:  03/11/16               Expected Discharge Plan:  Home/Self Care  In-House Referral:     Discharge planning Services  CM Consult  Post Acute Care Choice:    Choice offered to:     DME Arranged:    DME Agency:     HH Arranged:    HH Agency:     Status of Service:  Completed, signed off  If discussed at [place] of Stay Meetings, dates discussed:    Discharge Disposition: home/self care   Additional Comments:  Dawayne Patricia, RN 03/11/2016, 10:52 AM

## 2016-03-11 NOTE — Progress Notes (Signed)
Patient discharged teaching given including activity, diet, follow-up appointments and medication. Patient verbalized understanding of all discharge instructions. IV access was dc'd. Vitals are stable. Skin is intact. Pt to be escorted out by volunteer, to be driven home by family.

## 2016-03-14 ENCOUNTER — Telehealth (HOSPITAL_COMMUNITY): Payer: Self-pay

## 2016-03-14 ENCOUNTER — Ambulatory Visit (INDEPENDENT_AMBULATORY_CARE_PROVIDER_SITE_OTHER): Payer: Medicare Other | Admitting: Physician Assistant

## 2016-03-14 ENCOUNTER — Encounter (HOSPITAL_COMMUNITY): Payer: Self-pay | Admitting: Emergency Medicine

## 2016-03-14 ENCOUNTER — Other Ambulatory Visit: Payer: Self-pay | Admitting: Physician Assistant

## 2016-03-14 ENCOUNTER — Other Ambulatory Visit: Payer: Self-pay | Admitting: *Deleted

## 2016-03-14 ENCOUNTER — Ambulatory Visit
Admission: RE | Admit: 2016-03-14 | Discharge: 2016-03-14 | Disposition: A | Discharge status: Home / Self Care | Payer: Medicare Other | Source: Ambulatory Visit | Attending: Physician Assistant | Admitting: Physician Assistant

## 2016-03-14 ENCOUNTER — Inpatient Hospital Stay (HOSPITAL_COMMUNITY)
Admission: EM | Admit: 2016-03-14 | Discharge: 2016-03-18 | DRG: 200 | Disposition: A | Discharge status: Home / Self Care | Payer: Medicare Other | Attending: Cardiothoracic Surgery | Admitting: Cardiothoracic Surgery

## 2016-03-14 VITALS — BP 156/93 | HR 107 | Temp 98.6°F | Resp 20

## 2016-03-14 DIAGNOSIS — Z8041 Family history of malignant neoplasm of ovary: Secondary | ICD-10-CM | POA: Diagnosis not present

## 2016-03-14 DIAGNOSIS — R0602 Shortness of breath: Secondary | ICD-10-CM

## 2016-03-14 DIAGNOSIS — Z800 Family history of malignant neoplasm of digestive organs: Secondary | ICD-10-CM | POA: Diagnosis not present

## 2016-03-14 DIAGNOSIS — Z9889 Other specified postprocedural states: Secondary | ICD-10-CM

## 2016-03-14 DIAGNOSIS — C799 Secondary malignant neoplasm of unspecified site: Secondary | ICD-10-CM

## 2016-03-14 DIAGNOSIS — Z9842 Cataract extraction status, left eye: Secondary | ICD-10-CM

## 2016-03-14 DIAGNOSIS — Z8507 Personal history of malignant neoplasm of pancreas: Secondary | ICD-10-CM

## 2016-03-14 DIAGNOSIS — I10 Essential (primary) hypertension: Secondary | ICD-10-CM | POA: Diagnosis present

## 2016-03-14 DIAGNOSIS — J939 Pneumothorax, unspecified: Secondary | ICD-10-CM

## 2016-03-14 DIAGNOSIS — Z961 Presence of intraocular lens: Secondary | ICD-10-CM | POA: Diagnosis present

## 2016-03-14 DIAGNOSIS — Z85828 Personal history of other malignant neoplasm of skin: Secondary | ICD-10-CM | POA: Diagnosis not present

## 2016-03-14 DIAGNOSIS — Z9221 Personal history of antineoplastic chemotherapy: Secondary | ICD-10-CM | POA: Diagnosis not present

## 2016-03-14 DIAGNOSIS — Z7901 Long term (current) use of anticoagulants: Secondary | ICD-10-CM

## 2016-03-14 DIAGNOSIS — M13841 Other specified arthritis, right hand: Secondary | ICD-10-CM | POA: Diagnosis present

## 2016-03-14 DIAGNOSIS — C259 Malignant neoplasm of pancreas, unspecified: Secondary | ICD-10-CM

## 2016-03-14 DIAGNOSIS — Z9841 Cataract extraction status, right eye: Secondary | ICD-10-CM | POA: Diagnosis not present

## 2016-03-14 DIAGNOSIS — E440 Moderate protein-calorie malnutrition: Secondary | ICD-10-CM | POA: Insufficient documentation

## 2016-03-14 DIAGNOSIS — J9383 Other pneumothorax: Secondary | ICD-10-CM

## 2016-03-14 DIAGNOSIS — Z79899 Other long term (current) drug therapy: Secondary | ICD-10-CM

## 2016-03-14 DIAGNOSIS — Z9049 Acquired absence of other specified parts of digestive tract: Secondary | ICD-10-CM

## 2016-03-14 DIAGNOSIS — R0600 Dyspnea, unspecified: Secondary | ICD-10-CM | POA: Diagnosis not present

## 2016-03-14 DIAGNOSIS — Z888 Allergy status to other drugs, medicaments and biological substances status: Secondary | ICD-10-CM

## 2016-03-14 DIAGNOSIS — Z4682 Encounter for fitting and adjustment of non-vascular catheter: Secondary | ICD-10-CM | POA: Diagnosis not present

## 2016-03-14 DIAGNOSIS — Z85118 Personal history of other malignant neoplasm of bronchus and lung: Secondary | ICD-10-CM | POA: Diagnosis not present

## 2016-03-14 DIAGNOSIS — Z8541 Personal history of malignant neoplasm of cervix uteri: Secondary | ICD-10-CM | POA: Diagnosis not present

## 2016-03-14 DIAGNOSIS — Z90710 Acquired absence of both cervix and uterus: Secondary | ICD-10-CM

## 2016-03-14 DIAGNOSIS — C7800 Secondary malignant neoplasm of unspecified lung: Secondary | ICD-10-CM | POA: Diagnosis present

## 2016-03-14 DIAGNOSIS — K219 Gastro-esophageal reflux disease without esophagitis: Secondary | ICD-10-CM | POA: Diagnosis present

## 2016-03-14 DIAGNOSIS — Z87891 Personal history of nicotine dependence: Secondary | ICD-10-CM

## 2016-03-14 DIAGNOSIS — J948 Other specified pleural conditions: Secondary | ICD-10-CM | POA: Diagnosis not present

## 2016-03-14 LAB — CBC WITH DIFFERENTIAL/PLATELET
Basophils Absolute: 0 10*3/uL (ref 0.0–0.1)
Basophils Relative: 0 %
Eosinophils Absolute: 0 10*3/uL (ref 0.0–0.7)
Eosinophils Relative: 0 %
HCT: 34.6 % — ABNORMAL LOW (ref 36.0–46.0)
HEMOGLOBIN: 11.3 g/dL — AB (ref 12.0–15.0)
LYMPHS ABS: 1.4 10*3/uL (ref 0.7–4.0)
LYMPHS PCT: 20 %
MCH: 31 pg (ref 26.0–34.0)
MCHC: 32.7 g/dL (ref 30.0–36.0)
MCV: 95.1 fL (ref 78.0–100.0)
Monocytes Absolute: 0.9 10*3/uL (ref 0.1–1.0)
Monocytes Relative: 13 %
NEUTROS PCT: 66 %
Neutro Abs: 4.5 10*3/uL (ref 1.7–7.7)
Platelets: 181 10*3/uL (ref 150–400)
RBC: 3.64 MIL/uL — AB (ref 3.87–5.11)
RDW: 15.8 % — ABNORMAL HIGH (ref 11.5–15.5)
WBC: 6.8 10*3/uL (ref 4.0–10.5)

## 2016-03-14 LAB — BASIC METABOLIC PANEL
ANION GAP: 12 (ref 5–15)
BUN: 10 mg/dL (ref 6–20)
CHLORIDE: 105 mmol/L (ref 101–111)
CO2: 24 mmol/L (ref 22–32)
Calcium: 9.3 mg/dL (ref 8.9–10.3)
Creatinine, Ser: 1.05 mg/dL — ABNORMAL HIGH (ref 0.44–1.00)
GFR calc Af Amer: 58 mL/min — ABNORMAL LOW (ref >60)
GFR calc non Af Amer: 50 mL/min — ABNORMAL LOW (ref >60)
GLUCOSE: 111 mg/dL — AB (ref 65–99)
POTASSIUM: 2.8 mmol/L — AB (ref 3.5–5.1)
SODIUM: 141 mmol/L (ref 135–145)

## 2016-03-14 LAB — PROTIME-INR
INR: 1.38
PROTHROMBIN TIME: 17.1 s — AB (ref 11.4–15.2)

## 2016-03-14 LAB — CBC
HCT: 31.7 % — ABNORMAL LOW (ref 36.0–46.0)
Hemoglobin: 10.3 g/dL — ABNORMAL LOW (ref 12.0–15.0)
MCH: 30.9 pg (ref 26.0–34.0)
MCHC: 32.5 g/dL (ref 30.0–36.0)
MCV: 95.2 fL (ref 78.0–100.0)
Platelets: 152 10*3/uL (ref 150–400)
RBC: 3.33 MIL/uL — ABNORMAL LOW (ref 3.87–5.11)
RDW: 16 % — ABNORMAL HIGH (ref 11.5–15.5)
WBC: 6.4 10*3/uL (ref 4.0–10.5)

## 2016-03-14 MED ORDER — LOPERAMIDE HCL 2 MG PO CAPS: 2.0000 mg | ORAL_CAPSULE | ORAL | Status: DC | PRN

## 2016-03-14 MED ORDER — ACETAMINOPHEN 325 MG PO TABS
650.0000 mg | ORAL_TABLET | Freq: Four times a day (QID) | ORAL | Status: DC | PRN
  Administered 2016-03-15 – 2016-03-16 (×2): 650 mg via ORAL
  Filled 2016-03-14 (×2): qty 2

## 2016-03-14 MED ORDER — HYDROCHLOROTHIAZIDE 12.5 MG PO CAPS
12.5000 mg | ORAL_CAPSULE | Freq: Every day | ORAL | Status: DC
  Administered 2016-03-16 – 2016-03-18 (×3): 12.5 mg via ORAL
  Filled 2016-03-14 (×3): qty 1

## 2016-03-14 MED ORDER — PANCRELIPASE (LIP-PROT-AMYL) 12000-38000 UNITS PO CPEP
36000.0000 [IU] | ORAL_CAPSULE | Freq: Three times a day (TID) | ORAL | Status: DC
  Administered 2016-03-16 – 2016-03-18 (×8): 36000 [IU] via ORAL
  Filled 2016-03-14: qty 3
  Filled 2016-03-14: qty 1
  Filled 2016-03-14 (×7): qty 3

## 2016-03-14 MED ORDER — FOLIC ACID 1 MG PO TABS
1.0000 mg | ORAL_TABLET | Freq: Every day | ORAL | Status: DC
  Administered 2016-03-16 – 2016-03-18 (×3): 1 mg via ORAL
  Filled 2016-03-14 (×3): qty 1

## 2016-03-14 MED ORDER — AMOXICILLIN 500 MG PO CAPS: 500.0000 mg | ORAL_CAPSULE | Freq: Every morning | ORAL | Status: DC

## 2016-03-14 MED ORDER — ACETAMINOPHEN 650 MG RE SUPP: 650.0000 mg | Freq: Four times a day (QID) | RECTAL | Status: DC | PRN

## 2016-03-14 MED ORDER — KCL IN DEXTROSE-NACL 10-5-0.45 MEQ/L-%-% IV SOLN
INTRAVENOUS | Status: DC
  Administered 2016-03-14 – 2016-03-17 (×3): via INTRAVENOUS
  Filled 2016-03-14 (×4): qty 1000

## 2016-03-14 MED ORDER — ENOXAPARIN SODIUM 30 MG/0.3ML ~~LOC~~ SOLN: 30.0000 mg | Freq: Every day | SUBCUTANEOUS | Status: DC

## 2016-03-14 MED ORDER — ONDANSETRON HCL 4 MG PO TABS: 4.0000 mg | ORAL_TABLET | Freq: Four times a day (QID) | ORAL | Status: DC | PRN

## 2016-03-14 MED ORDER — METOPROLOL SUCCINATE ER 50 MG PO TB24
50.0000 mg | ORAL_TABLET | Freq: Every day | ORAL | Status: DC
  Administered 2016-03-16 – 2016-03-18 (×3): 50 mg via ORAL
  Filled 2016-03-14 (×3): qty 1

## 2016-03-14 MED ORDER — LOSARTAN POTASSIUM 50 MG PO TABS
100.0000 mg | ORAL_TABLET | Freq: Every day | ORAL | Status: DC
  Administered 2016-03-16 – 2016-03-18 (×3): 100 mg via ORAL
  Filled 2016-03-14 (×3): qty 2

## 2016-03-14 MED ORDER — ENOXAPARIN SODIUM 30 MG/0.3ML ~~LOC~~ SOLN: 30.0000 mg | SUBCUTANEOUS | Status: DC

## 2016-03-14 MED ORDER — OMEPRAZOLE 20 MG PO CPDR
20.0000 mg | DELAYED_RELEASE_CAPSULE | Freq: Every morning | ORAL | Status: DC
  Administered 2016-03-16 – 2016-03-18 (×3): 20 mg via ORAL
  Filled 2016-03-14 (×4): qty 1

## 2016-03-14 MED ORDER — SENNOSIDES-DOCUSATE SODIUM 8.6-50 MG PO TABS
1.0000 | ORAL_TABLET | Freq: Every evening | ORAL | Status: DC | PRN
Start: 2016-03-14 — End: 2016-03-18
  Filled 2016-03-14: qty 1

## 2016-03-14 MED ORDER — ONDANSETRON HCL 4 MG/2ML IJ SOLN: 4.0000 mg | Freq: Four times a day (QID) | INTRAMUSCULAR | Status: DC | PRN

## 2016-03-14 NOTE — Telephone Encounter (Signed)
Patients sister called stating patient needs to cancel chemo and MD visit for this week. She was discharged from hospital on Thursday 03/10/16 after having chest tube removed. On Sunday morning, patient started gasping for breath and was unable to walk unassisted to rest room. She has an appointment with Dr. Cyndia Bent this afternoon at 2:30 to do a walk test and plans to get home O2. Sister states it is her right lung this time giving her trouble. Last year it was her left lung. Instructed sister to call and keep [place] updated with what happens this afternoon and to reschedule appts.

## 2016-03-14 NOTE — H&P (Signed)
[address]       [place] [address]             [phone]        [name] [name] [place] Medical Record #[id number] Date of Birth: Nov 27, 1940  Referring: No ref. provider found Primary Care: Monico Blitz, MD  Chief Complaint:    Chief Complaint  Patient presents with  . Shortness of Breath  Patient examined, most recent chest x-rays and CT scan of chest late November 2017 personally reviewed and counseled with patient  History of Present Illness:     76 year old Caucasian female with severe COPD, metastatic pancreatic cancer receiving chemotherapy by Dr. Whitney Muse, status post left VATS with stapling of blebs, resection of metastatic pancreatic cancer, and pleurodesis a year ago by myself now with symptomatic right spontaneous pneumothorax. She was hospitalized about a week ago and had a right chest tube placed by Dr. Cyndia Bent. This resolved her basilar right pneumothorax and the tube was subtotally removed and she was discharged. She started having shortness of breath however and was on room air at home. She presented to the office today with shortness of breath and oxygen saturation of 85% which dropped to 78% with ambulation. Chest x-ray performed shows recurrent right basilar pneumothorax, moderate. His left side has no pneumothorax. She is currently in the emergency department comfortable on nasal cannula oxygen with saturation 96%.  The patient will be prepared for pigtail catheter placement by interventional radiology in the right subpulmonic space . This will probably be left in long-term as her general functional status is deteriorating along with weight loss from her metastatic cancer.   Current Activity/ Functional Status: Patient lives alone but has difficulty with ADLs especially with shortness of breath from her current spontaneous right pneumothorax   Zubrod Score: At the time of surgery this patient's most appropriate activity status/level should  be described as: '[]'$     0    Normal activity, no symptoms '[]'$     1    Restricted in physical strenuous activity but ambulatory, able to do out light work '[]'$     2    Ambulatory and capable of self care, unable to do work activities, up and about                 more than 50%  Of the time                            '[x]'$     3    Only limited self care, in bed greater than 50% of waking hours '[]'$     4    Completely disabled, no self care, confined to bed or chair '[]'$     5    Moribund  Past Medical History:  Diagnosis Date  . Abdominal wall hernia   . Anemia   . Aortic aneurysm (HCC)    small aortic aneurysm  . Arthritis    "little in my right hand" (03/10/2016)  . Cervical cancer ([place]) 1963  . COPD (chronic obstructive pulmonary disease) ([place])   . Emphysema   . GERD (gastroesophageal reflux disease)   . Goals of care, counseling/discussion 02/02/2016  . Hypercholesterolemia   . Hypertension   . Pancreatic adenocarcinoma [facility]) July 2010  . Pancreatic cancer metastasized to lung [facility]) 09/04/2015    Past Surgical History:  Procedure Laterality Date  . BREAST CYST EXCISION Right 1988  . BUNIONECTOMY Bilateral  2007  . CARDIAC CATHETERIZATION    . CATARACT EXTRACTION W/ INTRAOCULAR LENS  IMPLANT, BILATERAL Bilateral 2009  . CHOLECYSTECTOMY  July 2010   done w/ Whipple procedure  . SKIN CANCER EXCISION Left    nasal fold  . STAPLING OF BLEBS Left 04/20/2015   Procedure: STAPLING OF BLEBS;  Surgeon: Ivin Poot, MD;  Location: [place] [place];  Service: Thoracic;  Laterality: Left;  . THYROID SURGERY     "had a growth taken off"  . TONSILLECTOMY  1943  . VAGINAL HYSTERECTOMY  1963   Cervical Cancer  . VIDEO ASSISTED THORACOSCOPY Left 04/20/2015   Procedure: VIDEO ASSISTED THORACOSCOPY;  Surgeon: Ivin Poot, MD;  Location: [place];  Service: Thoracic;  Laterality: Left;  . WHIPPLE PROCEDURE  July 2010   pancreatic adenocarcinoma    History  Smoking Status  . Former Smoker  . Packs/day:  1.00  . Years: 30.00  . Types: Cigarettes  . Quit date: 02/29/1996  Smokeless Tobacco  . Never Used   History  Alcohol Use No    Social History   Social History  . Marital status: Widowed    Spouse name: N/A  . Number of children: 0  . Years of education: N/A   Occupational History  .  Retired   Social History Main Topics  . Smoking status: Former Smoker    Packs/day: 1.00    Years: 30.00    Types: Cigarettes    Quit date: 02/29/1996  . Smokeless tobacco: Never Used  . Alcohol use No  . Drug use: No  . Sexual activity: No   Other Topics Concern  . Not on file   Social History Narrative  . No narrative on file    Allergies  Allergen Reactions  . Oxaliplatin Anaphylaxis  . Pantoprazole Other (See Comments)    THIS GIVES THE PATIENT TERRIBLE HEARTBURN (PLEASE DO NOT GIVE)  . Adhesive [Tape] Rash  . Aspirin Other (See Comments)    Burns stomach  . Neulasta [Pegfilgrastim] Other (See Comments)    UNSURE   . Oxycodone Nausea And Vomiting  . Tramadol Nausea And Vomiting    No current facility-administered medications for this encounter.    Current Outpatient Prescriptions  Medication Sig Dispense Refill  . CREON 36000 units CPEP capsule TAKE 1 CAPSULE THREE TIMES A DAY WITH MEALS 270 capsule 1  . hydrochlorothiazide (HYDRODIURIL) 25 MG tablet Take 12.5 mg by mouth every morning.     . loperamide (IMODIUM A-D) 2 MG tablet At the onset of diarrhea, take 2 tabs. Then take 1 tab every 2 hours until you have gone 12 hours without having a loose stool. If it is bedtime and you have a loose stool(s) take 2 tabs every 4 hours until morning. Call [place]. 60 tablet 1  . losartan (COZAAR) 100 MG tablet Take 100 mg by mouth every morning.     . metoprolol succinate (TOPROL-XL) 50 MG 24 hr tablet Take 25 mg by mouth every morning. Take with or immediately following a meal.     . omeprazole (PRILOSEC) 20 MG capsule Take 20 mg by mouth daily before breakfast.     .  Potassium Bicarb-Citric Acid (EFFER-K) 10 MEQ TBEF Take 1 tablet (10 mEq total) by mouth 2 (two) times daily. 60 each 0  . prochlorperazine (COMPAZINE) 10 MG tablet Take 1 tablet (10 mg total) by mouth every 6 (six) hours as needed for nausea or vomiting. 30 tablet 2  . Alveda Reasons  20 MG TABS tablet Take 20 mg by mouth daily with supper.     Marland Kitchen acetaminophen (TYLENOL) 500 MG tablet Take 2 tablets (1,000 mg total) by mouth every 6 (six) hours as needed for mild pain. 30 tablet 0  . amoxicillin (AMOXIL) 500 MG capsule Take 2,000 mg by mouth See admin instructions. One hour prior to dental appt (as a pre-treatment)    . diphenoxylate-atropine (LOMOTIL) 2.5-0.025 MG tablet Take 1 tablet by mouth 4 (four) times daily as needed for diarrhea or loose stools. (Patient not taking: Reported on 03/14/2016) 30 tablet 0  . fluorouracil CALGB 16073 in sodium chloride 0.9 % 150 mL Inject into the vein. To be given every 14 days over 46 hours.    Marland Kitchen ibuprofen (ADVIL,MOTRIN) 200 MG tablet Take 200 mg by mouth every 6 (six) hours as needed for headache.     . IRINOTECAN HCL IV Inject into the vein. To be given every 14 days.    Marland Kitchen leucovorin in dextrose 5 % 250 mL Inject into the vein once. To be given every 14 days    . lidocaine-prilocaine (EMLA) cream Apply a quarter size amount to port site 1 hour prior to chemo. Do not rub in. Cover with plastic wrap. 30 g 3  . ondansetron (ZOFRAN) 8 MG tablet Take 1 tablet (8 mg total) by mouth every 8 (eight) hours as needed for nausea or vomiting. 30 tablet 2   Facility-Administered Medications Ordered in Other Encounters  Medication Dose Route Frequency Provider Last Rate Last Dose  . sodium chloride flush (NS) 0.9 % injection 10 mL  10 mL Intracatheter PRN Patrici Ranks, MD         (Not in a hospital admission)  Family History  Problem Relation Age of Onset  . Pancreatic cancer Brother   . Colon cancer Sister 79    12/2006 dx surgery and chemotherapy  . Ovarian cancer  Sister 65    Ovarian ca,  surgery and chemotherapy     Review of Systems:       Cardiac Review of Systems: Y or N  Chest Pain [  No  ]  Resting SOB [ yes  ] Exertional SOB  [ Yes ]  Orthopnea [ yes ]   Pedal Edema [mild   ]    Palpitations [ no ] Syncope  [ no ]   Presyncope [no   ]  General Review of Systems: [Y] = yes [  ]=no Constitional: recent weight change [ Losing weight ]; anorexia [place]  ]; fatigue [place]  ]; nausea [  ]; night sweats [  ]; fever [  ]; or chills [  ]                                                               Dental: poor dentition[  ]; Last Dentist visit: 6 months  Eye : blurred vision [  ]; diplopia [   ]; vision changes [  ];  Amaurosis fugax[  ]; Resp: cough [  ];  wheezing[  ];  hemoptysis[  ]; shortness of breath[  ]; paroxysmal nocturnal dyspnea[  ]; dyspnea on exertion[ yes  ]; or orthopnea[  ];  GI:  gallstones[  ], vomiting[  ];  dy sphagia[  ];  melena[  ];  hematochezia [  ]; heartburn[  ];   Hx of  Colonoscopy[  ]; diarrhea GU: kidney stones [  ]; hematuria[  ];   dysuria [  ];  nocturia[  ];  history of     obstruction [  ]; urinary frequency [  ]             Skin: rash, swelling[  ];, hair loss[  ];  peripheral edema[  ];  or itching[  ]; dry skin Musculosketetal: myalgias[  ];  joint swelling[  ];  joint erythema[  ];  joint pain[  ];  back pain[  ];  Heme/Lymph: bruising[  ];  bleeding[  ];  anemia[  ];  Neuro: TIA[  ];  headaches[  ];  stroke[  ];  vertigo[  ];  seizures[  ];   paresthesias[  ];  difficulty walking[  ];  Psych:depression[  ]; anxiety[  ];  Endocrine: diabetes[  ];  thyroid dysfunction[  ];  Immunizations: Flu [  ]; Pneumococcal[  ];  Other:  Physical Exam: BP 125/71 (BP Location: Left Arm)   Pulse 82   Temp 98.3 F (36.8 C) (Oral)   Resp 22   SpO2 96%       Physical Exam  General: Pleasant elderly chronically oh female no acute distress on nasal cannula HEENT: Normocephalic pupils equal , dentition adequate Neck:  Supple without JVD, adenopathy, or bruit Chest: Clear to auscultation on left, diminished right-sided breath sounds, no rhonchi, no tenderness             or deformity well-healed bilateral thoracotomy incisions, recent right chest tube site with sutures intact Cardiovascular: Regular rate and rhythm, no murmur, no gallop, peripheral pulses             palpable in all extremities Abdomen:  Soft, nontender, no palpable mass or organomegaly, reducible large ventral hernia in midline Extremities: Warm, well-perfused, no clubbing cyanosis edema or tenderness,              no venous stasis changes of the legs Rectal/GU: Deferred Neuro: Grossly non--focal and symmetrical throughout Skin: Clean and dry without rash or ulceration    Diagnostic Studies & Laboratory data:     Recent Radiology Findings:   Dg Chest 2 View  Result Date: 03/14/2016 CLINICAL DATA:  76 year old female with a past history of lung cancer and recent pneumothorax now with progressive shortness of breath on exertion. EXAM: CHEST  2 VIEW COMPARISON:  Prior chest x-rays including 03/05/2016 and 03/11/2016 FINDINGS: Cardiac and mediastinal contours remain unchanged. Atherosclerotic calcifications present in the transverse aorta. A single lumen port catheter is present overlying the right chest. The catheter tip overlies the mid SVC. Interval development of a moderate right hydropneumothorax. The pneumothorax is slightly larger than that seen on January 13th. Patchy opacities projecting over the left lung base consistent with a combination of an expansile lytic lesion in the anterior aspect of the left fifth rib and linear pleuroparenchymal scarring in the left lung base. Biapical pleuroparenchymal scarring again noted. No acute osseous abnormality. IMPRESSION: 1. Moderate right-sided hydropneumothorax. Overall, the size of the pneumothorax is slightly larger than that seen on January 13th 2018. 2. Extensive bilateral pleuroparenchymal  scarring and postsurgical changes again demonstrated. 3. Expansile lytic lesion in the anterolateral aspect of the left fifth rib. 4.  Aortic Atherosclerosis (ICD10-170.0). These results will be called to the ordering clinician or representative by the Radiologist Assistant, and communication documented  in the PACS or zVision Dashboard. Electronically Signed   By: Jacqulynn Cadet M.D.   On: 03/14/2016 15:43     I have independently reviewed the above radiologic studies.  Recent Lab Findings: Lab Results  Component Value Date   WBC 4.6 03/05/2016   HGB 9.6 (L) 03/05/2016   HCT 29.8 (L) 03/05/2016   PLT 173 03/05/2016   GLUCOSE 121 (H) 03/05/2016   ALT 10 (L) 03/01/2016   AST 19 03/01/2016   NA 137 03/05/2016   K 4.3 03/05/2016   CL 106 03/05/2016   CREATININE 1.08 (H) 03/05/2016   BUN 16 03/05/2016   CO2 21 (L) 03/05/2016   INR 1.09 04/19/2015      Assessment / Plan:    Recurrent spontaneous right pneumothorax primarily related to her pancreatic home a metastatic disease. The patient has had 2 previous right thoracotomies with significant adhesions noted on her most recent chest CT scan. Her general functional status is deteriorating and she is not a candidate for a formal right VATS for recurrent pneumothorax. We'll proceed with IR CT-guided pigtail catheter placed into the right basilar pneumothorax and then probable observation on suction, transition to water seal then management as an outpatient. Plan discussed with patient and she understands and agrees.

## 2016-03-14 NOTE — ED Provider Notes (Signed)
[facility] DEPT Provider Note   CSN: [id number] Arrival date & time: 03/14/16  1609     History   Chief Complaint Chief Complaint  Patient presents with  . Shortness of Breath    HPI [name] [name] is a 76 y.o. female.  HPI 76 year old female with a history of pancreas cancer with metastases to the lungs and a history of multiple pneumothoraces who was recently admitted for right-sided pneumothorax on the 13th. At that time she had a thal-quick and was admitted to their service until the 19th at that which he pneumothorax had resolved. She states that starting Saturday night she started noticing shortness of breath similar to her last pneumothorax. Her shortness of breath gradually worsened. She went to her cardio thoracic surgeon's office however they were unable to see her at that time and she was told to come to the emergency department. She endorses shortness of breath but denies any chest pain. She denies any fevers, cough, sputum production, abdominal pain, nausea, vomiting, diarrhea.  Past Medical History:  Diagnosis Date  . Abdominal wall hernia   . Anemia   . Aortic aneurysm (HCC)    small aortic aneurysm  . Arthritis    "little in my right hand" (03/10/2016)  . Cervical cancer ([place]) 1963  . COPD (chronic obstructive pulmonary disease) ([place])   . Emphysema   . GERD (gastroesophageal reflux disease)   . Goals of care, counseling/discussion 02/02/2016  . Hypercholesterolemia   . Hypertension   . Pancreatic adenocarcinoma [facility]) July 2010  . Pancreatic cancer metastasized to lung [facility]) 09/04/2015    Patient Active Problem List   Diagnosis Date Noted  . Pneumothorax on the right 03/05/2016  . Goals of care, counseling/discussion 02/02/2016  . Low serum vitamin B12 01/20/2016  . Low blood potassium 12/08/2015  . Pancreatic cancer metastasized to lung ([place]) 09/04/2015  . Pneumothorax, left 04/06/2015  . Cervical cancer ([place])   . Emphysema   . Hypertension   .  Lung cancer ([place]) 06/11/2010  . Cancer ([place]) 08/21/2008    Past Surgical History:  Procedure Laterality Date  . BREAST CYST EXCISION Right 1988  . BUNIONECTOMY Bilateral 2007  . CARDIAC CATHETERIZATION    . CATARACT EXTRACTION W/ INTRAOCULAR LENS  IMPLANT, BILATERAL Bilateral 2009  . CHOLECYSTECTOMY  July 2010   done w/ Whipple procedure  . SKIN CANCER EXCISION Left    nasal fold  . STAPLING OF BLEBS Left 04/20/2015   Procedure: STAPLING OF BLEBS;  Surgeon: Ivin Poot, MD;  Location: [place];  Service: Thoracic;  Laterality: Left;  . THYROID SURGERY     "had a growth taken off"  . TONSILLECTOMY  1943  . VAGINAL HYSTERECTOMY  1963   Cervical Cancer  . VIDEO ASSISTED THORACOSCOPY Left 04/20/2015   Procedure: VIDEO ASSISTED THORACOSCOPY;  Surgeon: Ivin Poot, MD;  Location: [place];  Service: Thoracic;  Laterality: Left;  . WHIPPLE PROCEDURE  July 2010   pancreatic adenocarcinoma    OB History    No data available       Home Medications    Prior to Admission medications   Medication Sig Start Date End Date Taking? Authorizing Provider  acetaminophen (TYLENOL) 500 MG tablet Take 2 tablets (1,000 mg total) by mouth every 6 (six) hours as needed for mild pain. 03/11/16   Erin R Barrett, PA-C  amoxicillin (AMOXIL) 500 MG capsule Take 2,000 mg by mouth See admin instructions. One hour prior to dental appt (as a pre-treatment)  01/26/16   Historical Provider, MD  CREON 36000 units CPEP capsule TAKE 1 CAPSULE THREE TIMES A DAY WITH MEALS 03/01/16   Patrici Ranks, MD  diphenoxylate-atropine (LOMOTIL) 2.5-0.025 MG tablet Take 1 tablet by mouth 4 (four) times daily as needed for diarrhea or loose stools. Patient not taking: Reported on 03/14/2016 10/14/15   Patrici Ranks, MD  fluorouracil CALGB 74827 in sodium chloride 0.9 % 150 mL Inject into the vein. To be given every 14 days over 46 hours.    Historical Provider, MD  hydrochlorothiazide (HYDRODIURIL) 25 MG tablet Take 12.5 mg  by mouth every morning.     Historical Provider, MD  ibuprofen (ADVIL,MOTRIN) 200 MG tablet Take 200 mg by mouth every 6 (six) hours as needed for headache.     Historical Provider, MD  IRINOTECAN HCL IV Inject into the vein. To be given every 14 days.    Historical Provider, MD  leucovorin in dextrose 5 % 250 mL Inject into the vein once. To be given every 14 days    Historical Provider, MD  lidocaine-prilocaine (EMLA) cream Apply a quarter size amount to port site 1 hour prior to chemo. Do not rub in. Cover with plastic wrap. 10/14/15   Patrici Ranks, MD  loperamide (IMODIUM A-D) 2 MG tablet At the onset of diarrhea, take 2 tabs. Then take 1 tab every 2 hours until you have gone 12 hours without having a loose stool. If it is bedtime and you have a loose stool(s) take 2 tabs every 4 hours until morning. Call [facility]. 10/14/15   Patrici Ranks, MD  losartan (COZAAR) 100 MG tablet Take 100 mg by mouth every morning.  08/31/15   Historical Provider, MD  metoprolol succinate (TOPROL-XL) 50 MG 24 hr tablet Take 25 mg by mouth every morning. Take with or immediately following a meal.     Historical Provider, MD  omeprazole (PRILOSEC) 20 MG capsule Take 20 mg by mouth daily before breakfast.     Historical Provider, MD  ondansetron (ZOFRAN) 8 MG tablet Take 1 tablet (8 mg total) by mouth every 8 (eight) hours as needed for nausea or vomiting. 10/14/15   Patrici Ranks, MD  Potassium Bicarb-Citric Acid (EFFER-K) 10 MEQ TBEF Take 1 tablet (10 mEq total) by mouth 2 (two) times daily. 12/08/15   Patrici Ranks, MD  prochlorperazine (COMPAZINE) 10 MG tablet Take 1 tablet (10 mg total) by mouth every 6 (six) hours as needed for nausea or vomiting. 10/14/15   Patrici Ranks, MD  XARELTO 20 MG TABS tablet Take 20 mg by mouth daily with supper.  08/04/15   Historical Provider, MD    Family History Family History  Problem Relation Age of Onset  . Pancreatic cancer Brother   . Colon cancer Sister  8    12/2006 dx surgery and chemotherapy  . Ovarian cancer Sister 27    Ovarian ca,  surgery and chemotherapy    Social History Social History  Substance Use Topics  . Smoking status: Former Smoker    Packs/day: 1.00    Years: 30.00    Types: Cigarettes    Quit date: 02/29/1996  . Smokeless tobacco: Never Used  . Alcohol use No     Allergies   Oxaliplatin; Aspirin; and Pantoprazole   Review of Systems Review of Systems  Constitutional: Negative for chills and fever.  HENT: Negative for ear pain and sore throat.   Eyes: Negative for pain and visual  disturbance.  Respiratory: Positive for shortness of breath. Negative for cough.   Cardiovascular: Negative for chest pain and palpitations.  Gastrointestinal: Negative for abdominal pain and vomiting.  Genitourinary: Negative for dysuria and hematuria.  Musculoskeletal: Negative for arthralgias and back pain.  Skin: Negative for color change and rash.  Neurological: Negative for seizures and syncope.  All other systems reviewed and are negative.    Physical Exam Updated Vital Signs BP 122/83 (BP Location: Right Arm)   Pulse 80   Temp 98.3 F (36.8 C) (Oral)   Resp 20   SpO2 96%   Physical Exam  Constitutional: She appears well-developed and well-nourished. No distress.  HENT:  Head: Normocephalic and atraumatic.  Eyes: Conjunctivae are normal.  Neck: Normal range of motion. Neck supple.  Cardiovascular: Normal rate, regular rhythm, S1 normal, S2 normal, normal heart sounds, intact distal pulses and normal pulses.   No murmur heard. Pulmonary/Chest: Accessory muscle usage present. Tachypnea noted. No respiratory distress. She has decreased breath sounds in the right upper field, the right middle field and the right lower field. She has no wheezes. She has no rhonchi. She has no rales.  Abdominal: Soft. There is no tenderness.  Musculoskeletal: She exhibits no edema.  Neurological: She is alert.  Skin: Skin is warm  and dry. Capillary refill takes less than 2 seconds.  Psychiatric: She has a normal mood and affect.  Nursing note and vitals reviewed.    ED Treatments / Results  Labs (all labs ordered are listed, but only abnormal results are displayed) Labs Reviewed - No data to display  EKG  EKG Interpretation None       Radiology Dg Chest 2 View  Result Date: 03/14/2016 CLINICAL DATA:  76 year old female with a past history of lung cancer and recent pneumothorax now with progressive shortness of breath on exertion. EXAM: CHEST  2 VIEW COMPARISON:  Prior chest x-rays including 03/05/2016 and 03/11/2016 FINDINGS: Cardiac and mediastinal contours remain unchanged. Atherosclerotic calcifications present in the transverse aorta. A single lumen port catheter is present overlying the right chest. The catheter tip overlies the mid SVC. Interval development of a moderate right hydropneumothorax. The pneumothorax is slightly larger than that seen on January 13th. Patchy opacities projecting over the left lung base consistent with a combination of an expansile lytic lesion in the anterior aspect of the left fifth rib and linear pleuroparenchymal scarring in the left lung base. Biapical pleuroparenchymal scarring again noted. No acute osseous abnormality. IMPRESSION: 1. Moderate right-sided hydropneumothorax. Overall, the size of the pneumothorax is slightly larger than that seen on January 13th 2018. 2. Extensive bilateral pleuroparenchymal scarring and postsurgical changes again demonstrated. 3. Expansile lytic lesion in the anterolateral aspect of the left fifth rib. 4.  Aortic Atherosclerosis (ICD10-170.0). These results will be called to the ordering clinician or representative by the Radiologist Assistant, and communication documented in the PACS or zVision Dashboard. Electronically Signed   By: Jacqulynn Cadet M.D.   On: 03/14/2016 15:43    Procedures Procedures (including critical care  time)  Medications Ordered in ED Medications - No data to display   Initial Impression / Assessment and Plan / ED Course  I have reviewed the triage vital signs and the nursing notes.  Pertinent labs & imaging results that were available during my care of the patient were reviewed by me and considered in my medical decision making (see chart for details).    76 year old female with a history of recurrent pneumothoraces due to metastatic  cancer and her lungs. Chest x-ray performed prior to the patient coming to the room and notable for moderate right-sided hydropneumothorax is slightly larger than what was seen on January 13. On arrival to the emergency department she is not hypotensive or tachycardic. She is slightly tachypnea with mild accessory muscle use but she is pleasant and in no distress. She is satting in the mid to high 90s on 2 L of oxygen. No signs of tension pneumothorax at this time. Past she was already initially seen at the [place] surgeons office, Dr. Kerby Less came to see the patient shortly after she arrived.  She will be admitted to the CT surgery service for a pigtail placed by IR tomorrow.  Patient care discussed and supervised by my attending, Dr. Darl Householder. Drucie Ip, MD   Final Clinical Impressions(s) / ED Diagnoses   Final diagnoses:  Pneumothorax on right  Spontaneous pneumothorax    New Prescriptions New Prescriptions   No medications on file     Bronwyn Belasco Mali Pancho Rushing, MD 03/14/16 1314    Drenda Freeze, MD 03/15/16 1123

## 2016-03-14 NOTE — Progress Notes (Signed)
HPI:  Patient well known to [facility].  She has history of Pacreatic cancer S/P Whipple, VATS with wedge resection of lung mets in RUL and RLL in 2011.  She also had VATs with stapling of blebs and pleurodesis in 2017.  She presented to OSH with complains of right upper chest pain and shortness of breath.  CXR and CT scan was obtained and showed a large right pneumothorax.  She was transferred to [facility] per her request for further care.  She underwent chest tube placement with resolution of pneumothorax and was later discharged home on 03/11/2016 in stable condition.  Today she presents with complaints of worsening shortness of breath.  She states this has developed slowly over the weekend, especially with trying to lay flat.  She states her saturation levels were low as well, with at times being in the 70s.  On arrival the patient continues to be short of breath.     Current Outpatient Prescriptions  Medication Sig Dispense Refill  . acetaminophen (TYLENOL) 500 MG tablet Take 2 tablets (1,000 mg total) by mouth every 6 (six) hours as needed for mild pain. 30 tablet 0  . amoxicillin (AMOXIL) 500 MG capsule Take 2,000 mg by mouth See admin instructions. One hour prior to dental appt (as a pre-treatment)    . CREON 36000 units CPEP capsule TAKE 1 CAPSULE THREE TIMES A DAY WITH MEALS 270 capsule 1  . fluorouracil CALGB 59563 in sodium chloride 0.9 % 150 mL Inject into the vein. To be given every 14 days over 46 hours.    . hydrochlorothiazide (HYDRODIURIL) 25 MG tablet Take 12.5 mg by mouth every morning.     Marland Kitchen ibuprofen (ADVIL,MOTRIN) 200 MG tablet Take 200 mg by mouth every 6 (six) hours as needed for headache.     . IRINOTECAN HCL IV Inject into the vein. To be given every 14 days.    Marland Kitchen leucovorin in dextrose 5 % 250 mL Inject into the vein once. To be given every 14 days    . lidocaine-prilocaine (EMLA) cream Apply a quarter size amount to port site 1 hour prior to chemo. Do not rub in. Cover  with plastic wrap. 30 g 3  . loperamide (IMODIUM A-D) 2 MG tablet At the onset of diarrhea, take 2 tabs. Then take 1 tab every 2 hours until you have gone 12 hours without having a loose stool. If it is bedtime and you have a loose stool(s) take 2 tabs every 4 hours until morning. Call [facility]. 60 tablet 1  . losartan (COZAAR) 100 MG tablet Take 100 mg by mouth every morning.     . metoprolol succinate (TOPROL-XL) 50 MG 24 hr tablet Take 25 mg by mouth every morning. Take with or immediately following a meal.     . omeprazole (PRILOSEC) 20 MG capsule Take 20 mg by mouth daily before breakfast.     . ondansetron (ZOFRAN) 8 MG tablet Take 1 tablet (8 mg total) by mouth every 8 (eight) hours as needed for nausea or vomiting. 30 tablet 2  . Potassium Bicarb-Citric Acid (EFFER-K) 10 MEQ TBEF Take 1 tablet (10 mEq total) by mouth 2 (two) times daily. 60 each 0  . prochlorperazine (COMPAZINE) 10 MG tablet Take 1 tablet (10 mg total) by mouth every 6 (six) hours as needed for nausea or vomiting. 30 tablet 2  . XARELTO 20 MG TABS tablet Take 20 mg by mouth daily with supper.     Marland Kitchen  diphenoxylate-atropine (LOMOTIL) 2.5-0.025 MG tablet Take 1 tablet by mouth 4 (four) times daily as needed for diarrhea or loose stools. (Patient not taking: Reported on 03/14/2016) 30 tablet 0   No current facility-administered medications for this visit.    Facility-Administered Medications Ordered in Other Visits  Medication Dose Route Frequency Provider Last Rate Last Dose  . sodium chloride flush (NS) 0.9 % injection 10 mL  10 mL Intracatheter PRN Patrici Ranks, MD        Physical Exam:  BP (!) 156/93 (BP Location: Right Arm, Patient Position: Sitting, Cuff Size: Normal)   Pulse (!) 107   Temp 98.6 F (37 C) (Oral)   Resp 20   SpO2 (!) 85% Comment: ON RA...down to 79% with exercise, 93% on O2  Sats worsened to 79% with ambulation  Gen: no apparent distress Heart: RRR, tachy Lungs: mildly diminished on  right, clear on left Incision: clean and dry  Diagnostic Tests:  CXR: shows basilar pneumothorax  A/P:  1. Recurrent Basilar pneumothorax- patient will require admission for observation 2. Hypoxemia- likely due to development of pneumothorax, will require oxygen therapy 3. H/O Pancreatic/Lung Cancer 4. Dispo- unfortunately the hospital census is full.  The patient will require overnight observation for her pneumothorax... Will have to refer to ED until patient can be admitted for IR placement of pigtail catheter... Dr. Prescott Gum is aware of patient's admission   Ellwood Handler, [facility] [phone]

## 2016-03-14 NOTE — ED Triage Notes (Signed)
Pt here with increasing pneumothorax; pt with hx of same just discharged on Friday

## 2016-03-15 ENCOUNTER — Encounter (HOSPITAL_COMMUNITY): Payer: Self-pay | Admitting: Interventional Radiology

## 2016-03-15 ENCOUNTER — Ambulatory Visit (HOSPITAL_COMMUNITY): Payer: Medicare Other

## 2016-03-15 ENCOUNTER — Inpatient Hospital Stay (HOSPITAL_COMMUNITY): Payer: Medicare Other

## 2016-03-15 ENCOUNTER — Ambulatory Visit (HOSPITAL_COMMUNITY): Payer: Medicare Other | Admitting: Oncology

## 2016-03-15 HISTORY — PX: IR GENERIC HISTORICAL: IMG1180011

## 2016-03-15 LAB — COMPREHENSIVE METABOLIC PANEL
ALT: 11 U/L — ABNORMAL LOW (ref 14–54)
AST: 17 U/L (ref 15–41)
Albumin: 3 g/dL — ABNORMAL LOW (ref 3.5–5.0)
Alkaline Phosphatase: 76 U/L (ref 38–126)
Anion gap: 9 (ref 5–15)
BUN: 14 mg/dL (ref 6–20)
CO2: 26 mmol/L (ref 22–32)
Calcium: 8.9 mg/dL (ref 8.9–10.3)
Chloride: 107 mmol/L (ref 101–111)
Creatinine, Ser: 1.01 mg/dL — ABNORMAL HIGH (ref 0.44–1.00)
GFR calc Af Amer: >60 mL/min (ref >60)
GFR calc non Af Amer: 52 mL/min — ABNORMAL LOW (ref >60)
Glucose, Bld: 110 mg/dL — ABNORMAL HIGH (ref 65–99)
Potassium: 2.6 mmol/L — CL (ref 3.5–5.1)
Sodium: 142 mmol/L (ref 135–145)
Total Bilirubin: 0.6 mg/dL (ref 0.3–1.2)
Total Protein: 5.6 g/dL — ABNORMAL LOW (ref 6.5–8.1)

## 2016-03-15 LAB — APTT: aPTT: 29 seconds (ref 24–36)

## 2016-03-15 MED ORDER — ENOXAPARIN SODIUM 30 MG/0.3ML ~~LOC~~ SOLN: 30.0000 mg | Freq: Every day | SUBCUTANEOUS | Status: DC

## 2016-03-15 MED ORDER — ENOXAPARIN SODIUM 40 MG/0.4ML ~~LOC~~ SOLN
40.0000 mg | Freq: Every day | SUBCUTANEOUS | Status: DC
  Administered 2016-03-16 – 2016-03-18 (×3): 40 mg via SUBCUTANEOUS
  Filled 2016-03-15 (×3): qty 0.4

## 2016-03-15 MED ORDER — FENTANYL CITRATE (PF) 100 MCG/2ML IJ SOLN
INTRAMUSCULAR | Status: AC | PRN
  Administered 2016-03-15: 50 ug via INTRAVENOUS

## 2016-03-15 MED ORDER — FENTANYL CITRATE (PF) 100 MCG/2ML IJ SOLN
INTRAMUSCULAR | Status: AC
  Filled 2016-03-15: qty 2

## 2016-03-15 MED ORDER — ENSURE ENLIVE PO LIQD
237.0000 mL | Freq: Two times a day (BID) | ORAL | Status: DC
  Administered 2016-03-16 – 2016-03-17 (×3): 237 mL via ORAL

## 2016-03-15 MED ORDER — MIDAZOLAM HCL 2 MG/2ML IJ SOLN
INTRAMUSCULAR | Status: AC | PRN
  Administered 2016-03-15: 1 mg via INTRAVENOUS

## 2016-03-15 MED ORDER — MIDAZOLAM HCL 2 MG/2ML IJ SOLN
INTRAMUSCULAR | Status: AC
  Filled 2016-03-15: qty 2

## 2016-03-15 MED ORDER — LIDOCAINE HCL 1 % IJ SOLN
INTRAMUSCULAR | Status: AC
  Filled 2016-03-15: qty 20

## 2016-03-15 MED ORDER — LIDOCAINE HCL 1 % IJ SOLN
INTRAMUSCULAR | Status: AC | PRN
  Administered 2016-03-15: 4 mL

## 2016-03-15 NOTE — Procedures (Signed)
S/p FLUORO RT CHEST TUBE INSERTION  No comp Stable Keep to [facility] Full report in PACS

## 2016-03-15 NOTE — Sedation Documentation (Signed)
Patient is resting comfortably. 

## 2016-03-15 NOTE — Progress Notes (Signed)
      [address]       [place] [address],[place] [address]             [phone]           Subjective: Patient in good spirits. No specific complaints this am.  Objective: Vital signs in last 24 hours: Temp:  [98 F (36.7 C)-98.6 F (37 C)] 98 F (36.7 C) (01/23 0646) Pulse Rate:  [73-107] 79 (01/23 0646) Cardiac Rhythm: Normal sinus rhythm (01/23 0126) Resp:  [18-25] 21 (01/23 0000) BP: (121-159)/(55-93) 137/68 (01/23 0646) SpO2:  [85 %-96 %] 94 % (01/23 0646) Weight:  [117 lb 11.2 oz (53.4 kg)-121 lb 4.1 oz (55 kg)] 121 lb 4.1 oz (55 kg) (01/23 0646)     Intake/Output from previous day: 01/22 0701 - 01/23 0700 In: -  Out: 100 [Urine:100]   Physical Exam:  Cardiovascular: RRR Pulmonary: Clear to auscultation on left and diminished on right.    Lab Results: CBC: Recent Labs  03/14/16 1732 03/14/16 1836  WBC 6.8 6.4  HGB 11.3* 10.3*  HCT 34.6* 31.7*  PLT 181 152   BMET:  Recent Labs  03/14/16 1732 03/15/16 0358  NA 141 142  K 2.8* 2.6*  CL 105 107  CO2 24 26  GLUCOSE 111* 110*  BUN 10 14  CREATININE 1.05* 1.01*  CALCIUM 9.3 8.9    PT/INR:  Recent Labs  03/14/16 1732  LABPROT 17.1*  INR 1.38   ABG:  INR: Will add last result for INR, ABG once components are confirmed Will add last 4 CBG results once components are confirmed  Assessment/Plan:  1. CV - SR in the 70's. 2.  Pulmonary - Severe COPD, metastatic pancreatic ancer. CXR appears so show moderate right pneumothorax, extensive bilateral pleuroparenchymal scarring, and expansile lytic lesion in the anterolateral aspect of the left fifth rib.She is NOT a candidate for right VATS. IR to place pigtail catheter.   Arushi Partridge MPA-C 03/15/2016,7:40 AM

## 2016-03-15 NOTE — Consult Note (Signed)
Chief Complaint: Patient was seen in consultation today for right chest tube drain placement Chief Complaint  Patient presents with  . Shortness of Breath   at the request of Dr Einar Grad  Referring Physician(s): Dr Einar Grad  Supervising Physician: Daryll Brod  Patient Status: [facility]  History of Present Illness: [name] [name] is a 76 y.o. female   Hx metastatic pancreatic Ca Severe COPD Post L VATS with bleb stapling and pleurodesis 1 yr ago Was hospitalized for SOB- approx 1 week ago. Rt PTX per imaging and right chest tube was placed 02/29/16 per Dr Cyndia Bent Removed few days later and discharged home. Presented back to ED 1/22 SOB and chest pain 1/22 CXR: IMPRESSION: 1. Moderate right-sided hydropneumothorax. Overall, the size of the pneumothorax is slightly larger than that seen on January 13th 2018. 2. Extensive bilateral pleuroparenchymal scarring and postsurgical changes again demonstrated. 3. Expansile lytic lesion in the anterolateral aspect of the left fifth rib. 4.  Aortic Atherosclerosis (ICD10-170.0).  Now request for Right chest tube drain placement per Dr Darcey Nora NOT candidate for R VATS per CTS Dr Annamaria Boots has reviewed imaging and approves procedure  Pt taking Xarelto daily at home Last dose 1/21 Lovenox not started until 1/24  Past Medical History:  Diagnosis Date  . Abdominal wall hernia   . Anemia   . Aortic aneurysm (HCC)    small aortic aneurysm  . Arthritis    "little in my right hand" (03/10/2016)  . Cervical cancer ([place]) 1963  . COPD (chronic obstructive pulmonary disease) ([place])   . Emphysema   . GERD (gastroesophageal reflux disease)   . Goals of care, counseling/discussion 02/02/2016  . Hypercholesterolemia   . Hypertension   . Pancreatic adenocarcinoma [facility]) July 2010  . Pancreatic cancer metastasized to lung [facility]) 09/04/2015    Past Surgical History:  Procedure Laterality Date  . BREAST CYST EXCISION Right 1988  .  BUNIONECTOMY Bilateral 2007  . CARDIAC CATHETERIZATION    . CATARACT EXTRACTION W/ INTRAOCULAR LENS  IMPLANT, BILATERAL Bilateral 2009  . CHOLECYSTECTOMY  July 2010   done w/ Whipple procedure  . SKIN CANCER EXCISION Left    nasal fold  . STAPLING OF BLEBS Left 04/20/2015   Procedure: STAPLING OF BLEBS;  Surgeon: Ivin Poot, MD;  Location: [place];  Service: Thoracic;  Laterality: Left;  . THYROID SURGERY     "had a growth taken off"  . TONSILLECTOMY  1943  . VAGINAL HYSTERECTOMY  1963   Cervical Cancer  . VIDEO ASSISTED THORACOSCOPY Left 04/20/2015   Procedure: VIDEO ASSISTED THORACOSCOPY;  Surgeon: Ivin Poot, MD;  Location: [place];  Service: Thoracic;  Laterality: Left;  . WHIPPLE PROCEDURE  July 2010   pancreatic adenocarcinoma    Allergies: Oxaliplatin; Pantoprazole; Adhesive [tape]; Aspirin; Neulasta [pegfilgrastim]; Oxycodone; and Tramadol  Medications: Prior to Admission medications   Medication Sig Start Date End Date Taking? Authorizing Provider  amoxicillin (AMOXIL) 500 MG capsule Take 2,000 mg by mouth See admin instructions. One hour prior to dental appt (as a pre-treatment) 01/26/16  Yes Historical Provider, MD  CREON 36000 units CPEP capsule TAKE 1 CAPSULE THREE TIMES A DAY WITH MEALS 03/01/16  Yes Patrici Ranks, MD  hydrochlorothiazide (HYDRODIURIL) 25 MG tablet Take 12.5 mg by mouth every morning.    Yes Historical Provider, MD  lidocaine-prilocaine (EMLA) cream Apply a quarter size amount to port site 1 hour prior to chemo. Do not rub in. Cover with plastic  wrap. 10/14/15  Yes Patrici Ranks, MD  loperamide (IMODIUM A-D) 2 MG tablet At the onset of diarrhea, take 2 tabs. Then take 1 tab every 2 hours until you have gone 12 hours without having a loose stool. If it is bedtime and you have a loose stool(s) take 2 tabs every 4 hours until morning. Call [place]. 10/14/15  Yes Patrici Ranks, MD  losartan (COZAAR) 100 MG tablet Take 100 mg by mouth every  morning.  08/31/15  Yes Historical Provider, MD  metoprolol succinate (TOPROL-XL) 50 MG 24 hr tablet Take 25 mg by mouth every morning. Take with or immediately following a meal.    Yes Historical Provider, MD  omeprazole (PRILOSEC) 20 MG capsule Take 20 mg by mouth daily before breakfast.    Yes Historical Provider, MD  ondansetron (ZOFRAN) 8 MG tablet Take 1 tablet (8 mg total) by mouth every 8 (eight) hours as needed for nausea or vomiting. 10/14/15  Yes Patrici Ranks, MD  Potassium Bicarb-Citric Acid (EFFER-K) 10 MEQ TBEF Take 1 tablet (10 mEq total) by mouth 2 (two) times daily. 12/08/15  Yes Patrici Ranks, MD  prochlorperazine (COMPAZINE) 10 MG tablet Take 1 tablet (10 mg total) by mouth every 6 (six) hours as needed for nausea or vomiting. 10/14/15  Yes Patrici Ranks, MD  XARELTO 20 MG TABS tablet Take 20 mg by mouth daily with supper.  08/04/15  Yes Historical Provider, MD     Family History  Problem Relation Age of Onset  . Pancreatic cancer Brother   . Colon cancer Sister 36    12/2006 dx surgery and chemotherapy  . Ovarian cancer Sister 79    Ovarian ca,  surgery and chemotherapy    Social History   Social History  . Marital status: Widowed    Spouse name: N/A  . Number of children: 0  . Years of education: N/A   Occupational History  .  Retired   Social History Main Topics  . Smoking status: Former Smoker    Packs/day: 1.00    Years: 30.00    Types: Cigarettes    Quit date: 02/29/1996  . Smokeless tobacco: Never Used  . Alcohol use No  . Drug use: No  . Sexual activity: No   Other Topics Concern  . None   Social History Narrative  . None    Review of Systems: A 12 point ROS discussed and pertinent positives are indicated in the HPI above.  All other systems are negative.  Review of Systems  Constitutional: Positive for activity change. Negative for diaphoresis, fatigue and fever.  Respiratory: Positive for shortness of breath. Negative for  wheezing.   Cardiovascular: Negative for chest pain.  Neurological: Positive for weakness.  Psychiatric/Behavioral: Negative for behavioral problems and confusion.    Vital Signs: BP 137/68 (BP Location: Right Arm)   Pulse 79   Temp 98 F (36.7 C) (Oral)   Resp 21   Ht '5\' 4"'$  (1.626 m)   Wt 121 lb 4.1 oz (55 kg)   SpO2 94%   BMI 20.81 kg/m   Physical Exam  Constitutional: She is oriented to person, place, and time.  Cardiovascular: Normal rate, regular rhythm and normal heart sounds.   Pulmonary/Chest: Effort normal. No respiratory distress.  Abdominal: Soft. Bowel sounds are normal.  Musculoskeletal: Normal range of motion.  Neurological: She is alert and oriented to person, place, and time.  Skin: Skin is warm and dry.  Psychiatric: She  has a normal mood and affect. Her behavior is normal. Judgment and thought content normal.  Nursing note and vitals reviewed.   Mallampati Score:  MD Evaluation Airway: WNL Heart: WNL Abdomen: WNL Chest/ Lungs: WNL ASA  Classification: 3 Mallampati/Airway Score: One  Imaging: Dg Chest 2 View  Result Date: 03/14/2016 CLINICAL DATA:  76 year old female with a past history of lung cancer and recent pneumothorax now with progressive shortness of breath on exertion. EXAM: CHEST  2 VIEW COMPARISON:  Prior chest x-rays including 03/05/2016 and 03/11/2016 FINDINGS: Cardiac and mediastinal contours remain unchanged. Atherosclerotic calcifications present in the transverse aorta. A single lumen port catheter is present overlying the right chest. The catheter tip overlies the mid SVC. Interval development of a moderate right hydropneumothorax. The pneumothorax is slightly larger than that seen on January 13th. Patchy opacities projecting over the left lung base consistent with a combination of an expansile lytic lesion in the anterior aspect of the left fifth rib and linear pleuroparenchymal scarring in the left lung base. Biapical pleuroparenchymal  scarring again noted. No acute osseous abnormality. IMPRESSION: 1. Moderate right-sided hydropneumothorax. Overall, the size of the pneumothorax is slightly larger than that seen on January 13th 2018. 2. Extensive bilateral pleuroparenchymal scarring and postsurgical changes again demonstrated. 3. Expansile lytic lesion in the anterolateral aspect of the left fifth rib. 4.  Aortic Atherosclerosis (ICD10-170.0). These results will be called to the ordering clinician or representative by the Radiologist Assistant, and communication documented in the PACS or zVision Dashboard. Electronically Signed   By: Jacqulynn Cadet M.D.   On: 03/14/2016 15:43   Dg Chest 2 View  Result Date: 03/11/2016 CLINICAL DATA:  Chest tube removal. EXAM: CHEST  2 VIEW COMPARISON:  01/18/ 2018. FINDINGS: PowerPort catheter stable position. Heart size stable. Postsurgical changes right upper lobe. Basilar pleuroparenchymal thickening consistent with scarring again noted. No definite pneumothorax noted on today's exam. IMPRESSION: 1. Basal pleural-parenchymal thickening again PowerPort catheter in stable position. 2. No evidence of pneumothorax on today's exam . 3. Postsurgical changes right upper lung. Bilateral pleuroparenchymal thickening again noted consistent scarring. No acute abnormality identified. Electronically Signed   By: Marcello Moores  Register   On: 03/11/2016 07:13   Dg Chest 2 View  Result Date: 03/10/2016 CLINICAL DATA:  76 year old female status post chest tube removal. Initial encounter. EXAM: CHEST  2 VIEW COMPARISON:  0608 hours today and earlier. FINDINGS: PA and lateral views of the chest at 1411 hours. The right chest tube has been removed. There is a tiny residual right apical pneumothorax suspected (arrow). Chronically large lung volumes with chronic lung disease including fairly extensive right apical and bibasilar architectural distortion, some which appears to be postoperative in nature. Stable right chest porta  cath. Stable cardiac size and mediastinal contours. Calcified aortic atherosclerosis. Osteopenia. No acute osseous abnormality identified. Negative visible bowel gas pattern. IMPRESSION: 1. Suspected tiny right apical residual pneumothorax following right chest tube removal. 2. Otherwise stable chest with chronic lung disease. Electronically Signed   By: Genevie Ann M.D.   On: 03/10/2016 14:45   Dg Chest 2 View  Result Date: 03/08/2016 CLINICAL DATA:  Chest 2, history of lung carcinoma EXAM: CHEST  2 VIEW COMPARISON:  Portable chest x-ray of 03/07/2016 FINDINGS: There is a right basilar hydropneumothorax present with right chest tube remaining. Right Port-A-Cath is unchanged with tip overlying the mid SVC. There has been improvement in the left basilar linear atelectasis with a small left pleural effusion remaining. Minimal opacity remains at the  left lung base. Mild cardiomegaly is stable. The lungs remain hyperaerated. The bones are osteopenic. IMPRESSION: 1. Right basilar hydropneumothorax with right chest tube present. 2. Small bilateral pleural effusions with improving aeration of the left lung base. Electronically Signed   By: Ivar Drape M.D.   On: 03/08/2016 09:11   Portable Chest 1 View  Result Date: 03/15/2016 CLINICAL DATA:  Pneumothorax. EXAM: PORTABLE CHEST 1 VIEW COMPARISON:  03/14/2016 FINDINGS: Right jugular Port-A-Cath is unchanged, terminating over the SVC. Cardiomediastinal silhouette is unchanged. Moderate-sized right pneumothorax has not significantly changed. There is underlying emphysema with postsurgical changes and extensive scarring in the lungs bilaterally. Patchy left basilar opacity is unchanged and corresponds to lung scarring and an anterior left fifth rib lesion as previously described. No large pleural effusion is identified. IMPRESSION: 1. Moderate right pneumothorax, unchanged. 2. Postsurgical changes in the lungs with extensive scarring. Electronically Signed   By: Logan Bores M.D.   On: 03/15/2016 07:41   Dg Chest Port 1 View  Result Date: 03/10/2016 CLINICAL DATA:  Pneumothorax EXAM: PORTABLE CHEST 1 VIEW COMPARISON:  03/09/2016 FINDINGS: Right chest tube remains in place. No pneumothorax. Port-A-Cath tip in the SVC Left lower lobe airspace disease unchanged. Negative for heart failure. Mild pleural scarring or pleural effusion bilaterally. IMPRESSION: No pneumothorax. Mild left lower lobe airspace disease unchanged. Electronically Signed   By: Franchot Gallo M.D.   On: 03/10/2016 07:09   Dg Chest Port 1 View  Result Date: 03/09/2016 CLINICAL DATA:  Recent pneumothorax requiring chest tube. Shortness of breath. EXAM: PORTABLE CHEST 1 VIEW COMPARISON:  CT 03/05/2016.  Radiographs 03/07/2016 and 03/08/2016. FINDINGS: 0647 hour. The right IJ Port-A-Cath and right chest tube are unchanged in position. No residual pneumothorax demonstrated. The heart size and mediastinal contours are stable with aortic atherosclerosis. There is chronic lung disease with postsurgical changes and scarring at both lung bases. No significant pleural effusion. IMPRESSION: No residual pneumothorax identified. Stable scarring at both lung bases. Electronically Signed   By: Richardean Sale M.D.   On: 03/09/2016 08:48   Dg Chest Port 1 View  Result Date: 03/07/2016 CLINICAL DATA:  Pneumothorax, nausea Hx of cancer, HTN, COPD, aortic aneurysm EXAM: PORTABLE CHEST 1 VIEW COMPARISON:  03/06/2016 FINDINGS: Right-sided Port-A-Cath terminates at the low SVC. Right-sided chest tube remains in place. Patient rotated minimally right. Normal heart size. Trace left pleural fluid or thickening. Suspect minimal super lateral right-sided pleural air. Left greater than right base airspace disease is similar. Underlying hyperinflation indicative of COPD. IMPRESSION: Right-sided chest tube in place with possible small volume superolateral pneumothorax. Otherwise, similar appearance of COPD/hyperinflation and  bibasilar airspace opacities. Electronically Signed   By: Abigail Miyamoto M.D.   On: 03/07/2016 08:10   Portable Chest 1 View  Result Date: 03/06/2016 CLINICAL DATA:  Chest tube. Pancreatic carcinoma with metastatic disease the lung. Emphysema. EXAM: PORTABLE CHEST 1 VIEW COMPARISON:  One-view chest x-ray 03/05/2016 FINDINGS: The heart size is normal. Aortic atherosclerosis is present. A a right IJ Port-A-Cath is stable. A right-sided chest tube is in place. There is no significant pneumothorax. Interstitial coarsening and scarring is stable. IMPRESSION: 1. Right chest tube remains in place without pneumothorax. 2. Aortic atherosclerotic is is. 3. Stable bilateral opacities compatible with scarring and interstitial coarsening. Electronically Signed   By: San Morelle M.D.   On: 03/06/2016 07:16   Dg Chest Portable 1 View  Result Date: 03/05/2016 CLINICAL DATA:  Right-sided chest tube. EXAM: PORTABLE CHEST 1 VIEW COMPARISON:  March 05, 2016 FINDINGS: A new right chest tube has been placed with the distal tip of the medial right upper chest. No pneumothorax identified. Persistent opacities in the left base. Decreased opacity in the lateral right lung base. No other interval changes. IMPRESSION: Right chest tube placement with resolution pneumothorax. No other significant change. Electronically Signed   By: Dorise Bullion III M.D   On: 03/05/2016 19:48    Labs:  CBC:  Recent Labs  03/01/16 0910 03/05/16 2049 03/14/16 1732 03/14/16 1836  WBC 5.9 4.6 6.8 6.4  HGB 10.1* 9.6* 11.3* 10.3*  HCT 31.4* 29.8* 34.6* 31.7*  PLT 146* 173 181 152    COAGS:  Recent Labs  04/06/15 2136 04/19/15 2005 03/14/16 1732 03/15/16 0358  INR 1.11 1.09 1.38  --   APTT  --  27  --  29    BMP:  Recent Labs  03/01/16 0913 03/05/16 2049 03/14/16 1732 03/15/16 0358  NA 138 137 141 142  K 4.0 4.3 2.8* 2.6*  CL 113* 106 105 107  CO2 19* 21* 24 26  GLUCOSE 149* 121* 111* 110*  BUN '13 16 10 14    '$ CALCIUM 8.9 8.9 9.3 8.9  CREATININE 1.22* 1.08* 1.05* 1.01*  GFRNONAA 42* 48* 50* 52*  GFRAA 48* 56* 58* >60    LIVER FUNCTION TESTS:  Recent Labs  02/02/16 0936 02/16/16 0916 03/01/16 0913 03/15/16 0358  BILITOT 0.6 0.5 0.8 0.6  AST '16 17 19 17  '$ ALT 13* 11* 10* 11*  ALKPHOS 100 100 104 76  PROT 6.2* 6.4* 6.3* 5.6*  ALBUMIN 3.6 3.5 3.3* 3.0*    TUMOR MARKERS:  Recent Labs  12/08/15 0932 12/22/15 0847 02/02/16 0936 03/01/16 0910  CA199 435* 374* 238* 196*    Assessment and Plan:  Severe COPD Hx L VATS and pleurodesis yr ago Now has had 2 PTX in just 3 weeks Rt chest tube placed and removed last week per Dr Cyndia Bent New R PTX---scheduled for IR chest tube drain now Risks and Benefits discussed with the patient including bleeding, infection, damage to adjacent structures, death, and sepsis. All of the patient's questions were answered, patient is agreeable to proceed. Consent signed and in chart.   Thank you for this interesting consult.  I greatly enjoyed meeting [name] [name] and look forward to participating in their care.  A copy of this report was sent to the requesting provider on this date.  Electronically Signed: Monia Sabal A 03/15/2016, 9:32 AM   I spent a total of 40 Minutes    in face to face in clinical consultation, greater than 50% of which was counseling/coordinating care for right chest tube drain placement

## 2016-03-15 NOTE — Progress Notes (Signed)
Patient admitted with SOB with admitting diagnosis of recurrent pneumothorax. Family by the bedside. Oriented to room and surroundings. Family by the bedside.

## 2016-03-15 NOTE — Progress Notes (Signed)
Patient Potassium 2.6. Dropped from 2.8. Already receiving D5 with half NS with 10 of potassium. Patient sleeping att his time. Denies any distress.

## 2016-03-15 NOTE — Plan of Care (Signed)
Problem: Phase I Progression Outcomes Goal: Dyspnea controlled at rest Outcome: Progressing Patient continues on oxygen at 3L. Oxygen saturation at 93%.

## 2016-03-16 ENCOUNTER — Inpatient Hospital Stay (HOSPITAL_COMMUNITY): Payer: Medicare Other

## 2016-03-16 ENCOUNTER — Telehealth (HOSPITAL_COMMUNITY): Payer: Self-pay

## 2016-03-16 DIAGNOSIS — E440 Moderate protein-calorie malnutrition: Secondary | ICD-10-CM | POA: Insufficient documentation

## 2016-03-16 MED ORDER — IBUPROFEN 200 MG PO TABS
400.0000 mg | ORAL_TABLET | Freq: Four times a day (QID) | ORAL | Status: DC | PRN
  Administered 2016-03-16 – 2016-03-18 (×6): 400 mg via ORAL
  Filled 2016-03-16 (×6): qty 2

## 2016-03-16 MED ORDER — POTASSIUM CHLORIDE CRYS ER 20 MEQ PO TBCR
40.0000 meq | EXTENDED_RELEASE_TABLET | Freq: Two times a day (BID) | ORAL | Status: AC
  Administered 2016-03-16 (×2): 40 meq via ORAL
  Filled 2016-03-16 (×2): qty 2

## 2016-03-16 MED ORDER — BOOST / RESOURCE BREEZE PO LIQD
1.0000 | Freq: Three times a day (TID) | ORAL | Status: DC
  Administered 2016-03-16 – 2016-03-18 (×4): 1 via ORAL

## 2016-03-16 NOTE — Progress Notes (Signed)
Referring Physician(s): DR Mamie Nick VanTrigt  Supervising Physician: Arne Cleveland  Patient Status:  [facility] - In-pt  Chief Complaint:  Right PTX 1/23: Spontaneous pneumothorax [J93.83]       S/p FLUORO RT CHEST TUBE INSERTION       Subjective:  Doing well this am Painful chest tube site Up in bed Eating well  Allergies: Oxaliplatin; Pantoprazole; Adhesive [tape]; Aspirin; Neulasta [pegfilgrastim]; Oxycodone; and Tramadol  Medications: Prior to Admission medications   Medication Sig Start Date End Date Taking? Authorizing Provider  amoxicillin (AMOXIL) 500 MG capsule Take 2,000 mg by mouth See admin instructions. One hour prior to dental appt (as a pre-treatment) 01/26/16  Yes Historical Provider, MD  CREON 36000 units CPEP capsule TAKE 1 CAPSULE THREE TIMES A DAY WITH MEALS 03/01/16  Yes Patrici Ranks, MD  hydrochlorothiazide (HYDRODIURIL) 25 MG tablet Take 12.5 mg by mouth every morning.    Yes Historical Provider, MD  lidocaine-prilocaine (EMLA) cream Apply a quarter size amount to port site 1 hour prior to chemo. Do not rub in. Cover with plastic wrap. 10/14/15  Yes Patrici Ranks, MD  loperamide (IMODIUM A-D) 2 MG tablet At the onset of diarrhea, take 2 tabs. Then take 1 tab every 2 hours until you have gone 12 hours without having a loose stool. If it is bedtime and you have a loose stool(s) take 2 tabs every 4 hours until morning. Call [place]. 10/14/15  Yes Patrici Ranks, MD  losartan (COZAAR) 100 MG tablet Take 100 mg by mouth every morning.  08/31/15  Yes Historical Provider, MD  metoprolol succinate (TOPROL-XL) 50 MG 24 hr tablet Take 25 mg by mouth every morning. Take with or immediately following a meal.    Yes Historical Provider, MD  omeprazole (PRILOSEC) 20 MG capsule Take 20 mg by mouth daily before breakfast.    Yes Historical Provider, MD  ondansetron (ZOFRAN) 8 MG tablet Take 1 tablet (8 mg total) by mouth every 8 (eight) hours as needed for nausea  or vomiting. 10/14/15  Yes Patrici Ranks, MD  Potassium Bicarb-Citric Acid (EFFER-K) 10 MEQ TBEF Take 1 tablet (10 mEq total) by mouth 2 (two) times daily. 12/08/15  Yes Patrici Ranks, MD  prochlorperazine (COMPAZINE) 10 MG tablet Take 1 tablet (10 mg total) by mouth every 6 (six) hours as needed for nausea or vomiting. 10/14/15  Yes Patrici Ranks, MD  XARELTO 20 MG TABS tablet Take 20 mg by mouth daily with supper.  08/04/15  Yes Historical Provider, MD     Vital Signs: BP (!) 142/72 (BP Location: Right Arm)   Pulse 70   Temp 97.6 F (36.4 C) (Oral)   Resp 18   Ht '5\' 4"'$  (1.626 m)   Wt 118 lb 9.6 oz (53.8 kg)   SpO2 99%   BMI 20.36 kg/m   Physical Exam  Constitutional: She is oriented to person, place, and time.  Pulmonary/Chest: Effort normal and breath sounds normal.  Musculoskeletal: Normal range of motion.  Neurological: She is alert and oriented to person, place, and time.  Skin: Skin is warm and dry.  Right chest tube site is clean and dry Tender No bleeding  110 cc yellow fluid in Pleurvac ++ airleak  Psychiatric: She has a normal mood and affect.   1/24: CXR: IMPRESSION: No pneumothorax is noted on the right. Chronic changes bilaterally stable from the previous exam.   Imaging: Dg Chest 2 View  Result Date: 03/14/2016 CLINICAL  DATA:  76 year old female with a past history of lung cancer and recent pneumothorax now with progressive shortness of breath on exertion. EXAM: CHEST  2 VIEW COMPARISON:  Prior chest x-rays including 03/05/2016 and 03/11/2016 FINDINGS: Cardiac and mediastinal contours remain unchanged. Atherosclerotic calcifications present in the transverse aorta. A single lumen port catheter is present overlying the right chest. The catheter tip overlies the mid SVC. Interval development of a moderate right hydropneumothorax. The pneumothorax is slightly larger than that seen on January 13th. Patchy opacities projecting over the left lung base  consistent with a combination of an expansile lytic lesion in the anterior aspect of the left fifth rib and linear pleuroparenchymal scarring in the left lung base. Biapical pleuroparenchymal scarring again noted. No acute osseous abnormality. IMPRESSION: 1. Moderate right-sided hydropneumothorax. Overall, the size of the pneumothorax is slightly larger than that seen on January 13th 2018. 2. Extensive bilateral pleuroparenchymal scarring and postsurgical changes again demonstrated. 3. Expansile lytic lesion in the anterolateral aspect of the left fifth rib. 4.  Aortic Atherosclerosis (ICD10-170.0). These results will be called to the ordering clinician or representative by the Radiologist Assistant, and communication documented in the PACS or zVision Dashboard. Electronically Signed   By: Jacqulynn Cadet M.D.   On: 03/14/2016 15:43   Ir Guided Niel Hummer W Catheter Placement  Result Date: 03/15/2016 INDICATION: Recurrent spontaneous right pneumothorax EXAM: ABSCESS DRAINAGE MEDICATIONS: 1% lidocaine locally ANESTHESIA/SEDATION: Fentanyl 50 mcg IV; Versed 1.0 mg IV Moderate Sedation Time:  10 The patient was continuously monitored during the procedure by the interventional radiology nurse under my direct supervision. COMPLICATIONS: None immediate. PROCEDURE: Informed written consent was obtained from the patient after a thorough discussion of the procedural risks, benefits and alternatives. All questions were addressed. Maximal Sterile Barrier Technique was utilized including caps, mask, sterile gowns, sterile gloves, sterile drape, hand hygiene and skin antiseptic. A timeout was performed prior to the initiation of the procedure. Previous imaging reviewed. Patient positioned supine. Under fluoroscopy, the moderate right basilar pneumothorax was localized. In the mid axillary line, a lower right intercostal space was localized. Overlying skin marked. Under sterile conditions and local anesthesia, a 19 gauge  introducer needle was advanced percutaneously into the right pleural space. There was return of air with syringe aspiration. Amplatz guidewire inserted. Tract dilatation performed to insert a 10 [place] drain. Retention loop formed in the pleural space. Position confirmed with fluoroscopy. Images obtained for documentation. Catheter secured with a Prolene suture and connected to a pleura vac. Sterile dressing applied. No immediate complication. Patient tolerated the procedure well. IMPRESSION: Successful fluoroscopic 10 French right basilar chest tube insertion. Electronically Signed   By: Jerilynn Mages.  Shick M.D.   On: 03/15/2016 15:30   Dg Chest Port 1 View  Result Date: 03/16/2016 CLINICAL DATA:  Status post right chest tube placement EXAM: PORTABLE CHEST 1 VIEW COMPARISON:  03/15/2016 FINDINGS: Cardiac shadow is stable. Right-sided chest wall port is again seen. Chronic changes are noted within both lungs as well as extensive postsurgical changes bilaterally. These changes are stable from the prior exam. There is been interval re-expansion of the right lung when compared with the prior study secondary to chest tube placement in the right base. No significant residual pneumothorax is seen. IMPRESSION: No pneumothorax is noted on the right. Chronic changes bilaterally stable from the previous exam. Electronically Signed   By: Inez Catalina M.D.   On: 03/16/2016 08:23   Portable Chest 1 View  Result Date: 03/15/2016 CLINICAL DATA:  Pneumothorax. EXAM:  PORTABLE CHEST 1 VIEW COMPARISON:  03/14/2016 FINDINGS: Right jugular Port-A-Cath is unchanged, terminating over the SVC. Cardiomediastinal silhouette is unchanged. Moderate-sized right pneumothorax has not significantly changed. There is underlying emphysema with postsurgical changes and extensive scarring in the lungs bilaterally. Patchy left basilar opacity is unchanged and corresponds to lung scarring and an anterior left fifth rib lesion as previously described. No  large pleural effusion is identified. IMPRESSION: 1. Moderate right pneumothorax, unchanged. 2. Postsurgical changes in the lungs with extensive scarring. Electronically Signed   By: Logan Bores M.D.   On: 03/15/2016 07:41    Labs:  CBC:  Recent Labs  03/01/16 0910 03/05/16 2049 03/14/16 1732 03/14/16 1836  WBC 5.9 4.6 6.8 6.4  HGB 10.1* 9.6* 11.3* 10.3*  HCT 31.4* 29.8* 34.6* 31.7*  PLT 146* 173 181 152    COAGS:  Recent Labs  04/06/15 2136 04/19/15 2005 03/14/16 1732 03/15/16 0358  INR 1.11 1.09 1.38  --   APTT  --  27  --  29    BMP:  Recent Labs  03/01/16 0913 03/05/16 2049 03/14/16 1732 03/15/16 0358  NA 138 137 141 142  K 4.0 4.3 2.8* 2.6*  CL 113* 106 105 107  CO2 19* 21* 24 26  GLUCOSE 149* 121* 111* 110*  BUN '13 16 10 14  '$ CALCIUM 8.9 8.9 9.3 8.9  CREATININE 1.22* 1.08* 1.05* 1.01*  GFRNONAA 42* 48* 50* 52*  GFRAA 48* 56* 58* >60    LIVER FUNCTION TESTS:  Recent Labs  02/02/16 0936 02/16/16 0916 03/01/16 0913 03/15/16 0358  BILITOT 0.6 0.5 0.8 0.6  AST '16 17 19 17  '$ ALT 13* 11* 10* 11*  ALKPHOS 100 100 104 76  PROT 6.2* 6.4* 6.3* 5.6*  ALBUMIN 3.6 3.5 3.3* 3.0*    Assessment and Plan:  Right chest tube drain in place Plan per Dr Everlene Balls  Electronically Signed: Monia Sabal A 03/16/2016, 10:42 AM   I spent a total of 15 Minutes at the the patient's bedside AND on the patient's hospital floor or unit, greater than 50% of which was counseling/coordinating care for right chest tube

## 2016-03-16 NOTE — Progress Notes (Addendum)
      [address]       [place] [address]             [phone]           Subjective: Patient with pain at right chest tube site this am.  Objective: Vital signs in last 24 hours: Temp:  [97.6 F (36.4 C)-98.2 F (36.8 C)] 97.6 F (36.4 C) (01/24 0300) Pulse Rate:  [62-98] 64 (01/24 0300) Cardiac Rhythm: Normal sinus rhythm (01/23 2130) Resp:  [16-27] 18 (01/24 0300) BP: (116-166)/(55-89) 153/55 (01/24 0300) SpO2:  [96 %-100 %] 99 % (01/24 0300) Weight:  [118 lb 9.6 oz (53.8 kg)] 118 lb 9.6 oz (53.8 kg) (01/24 0300)     Intake/Output from previous day: 01/23 0701 - 01/24 0700 In: 120 [I.V.:120] Out: -    Physical Exam:  Cardiovascular: RRR Pulmonary: Clear to auscultation on left and slightly diminished on right.    Lab Results: CBC:  Recent Labs  03/14/16 1732 03/14/16 1836  WBC 6.8 6.4  HGB 11.3* 10.3*  HCT 34.6* 31.7*  PLT 181 152   BMET:   Recent Labs  03/14/16 1732 03/15/16 0358  NA 141 142  K 2.8* 2.6*  CL 105 107  CO2 24 26  GLUCOSE 111* 110*  BUN 10 14  CREATININE 1.05* 1.01*  CALCIUM 9.3 8.9    PT/INR:   Recent Labs  03/14/16 1732  LABPROT 17.1*  INR 1.38   ABG:  INR: Will add last result for INR, ABG once components are confirmed Will add last 4 CBG results once components are confirmed  Assessment/Plan:  1. CV - SR in the 70's. 2.  Pulmonary - Severe COPD, metastatic pancreatic ancer. S/p IR pigtail catheter. No air leak.CXR appears so show  extensive bilateral pleuroparenchymal scarring, and expansile lytic lesion in the anterolateral aspect of the left fifth rib.She is NOT a candidate for right VATS. Hope to place pigtail to mini express in am. 3. Patient states unable to take strong narcotic but is having a fair amount of pain at chest tube site. Will try Ibuprofen as unable to take Ultram or codeine products.  [name] [name] MPA-C 03/16/2016,7:36 AM

## 2016-03-16 NOTE — Telephone Encounter (Signed)
Patients family came by and let us know that [name] [name] is in the hospital and has another chest tube. They state the plan is to send her home with the chest tube. Dr. Whitney Muse notified and was already aware.

## 2016-03-16 NOTE — Progress Notes (Addendum)
Initial Nutrition Assessment  DOCUMENTATION CODES:   Non-severe (moderate) malnutrition in context of chronic illness  INTERVENTION:    Continue Boost Breeze po TID, each supplement provides 250 kcal and 9 grams of protein   Continue Ensure Enlive po BID, each supplement provides 350 kcal and 20 grams of protein  NUTRITION DIAGNOSIS:   Malnutrition related to catabolic illness as evidenced by moderate depletion of body fat, moderate depletions of muscle mass, percent weight loss (5% x 1 month)  GOAL:   Patient will meet greater than or equal to 90% of their needs  MONITOR:   PO intake, Supplement acceptance, Labs, Weight trends, I & O's  REASON FOR ASSESSMENT:   Malnutrition Screening Tool  ASSESSMENT:   76 yo Female with severe COPD, metastatic pancreatic cancer receiving chemotherapy by Dr. Whitney Muse, status post left VATS with stapling of blebs, resection of metastatic pancreatic cancer, and pleurodesis a year ago now with symptomatic right spontaneous pneumothorax.    Pt is being followed by outpatient RD at [facility].  Pt reports her appetite is "doing ok;" PO intake 50-100%. Was typically consuming 1-2 meals per day PTA. She has also been drinking oral nutrition supplements at home. Receiving Boost Breeze and [employer] here (enjoys both). Per wt readings, pt has had a 5% weight loss x 1 month.  Nutrition-Focused physical exam completed. Findings are moderate fat depletion, moderate muscle depletion, and no edema.   Diet Order:  Diet Heart Room service appropriate? Yes; Fluid consistency: Thin  Skin:  Reviewed, no issues  Last BM:  1/22  Height:   Ht Readings from Last 1 Encounters:  03/15/16 '5\' 4"'$  (1.626 m)    Weight:   Wt Readings from Last 1 Encounters:  03/16/16 118 lb 9.6 oz (53.8 kg)   Wt Readings from Last 15 Encounters:  03/16/16 118 lb 9.6 oz (53.8 kg)  03/05/16 120 lb 14.4 oz (54.8 kg)  03/01/16 124 lb (56.2 kg)   02/16/16 125 lb 3.2 oz (56.8 kg)  02/02/16 125 lb 8 oz (56.9 kg)  01/05/16 125 lb 12.8 oz (57.1 kg)  12/22/15 126 lb 12.8 oz (57.5 kg)  12/08/15 126 lb 6.4 oz (57.3 kg)  11/24/15 127 lb (57.6 kg)  11/16/15 129 lb 3.2 oz (58.6 kg)  11/09/15 127 lb 12.8 oz (58 kg)  11/02/15 129 lb 3.2 oz (58.6 kg)  10/27/15 129 lb 12.8 oz (58.9 kg)  10/19/15 129 lb 3.2 oz (58.6 kg)  10/14/15 129 lb 9.6 oz (58.8 kg)    Ideal Body Weight:  54.5 kg  BMI:  Body mass index is 20.36 kg/m.  Estimated Nutritional Needs:   Kcal:  1600-1800  Protein:  80-90 gm  Fluid:  1.6-1.8 L  EDUCATION NEEDS:   No education needs identified at this time  Arthur Holms, RD, LDN Pager #: [phone] After-Hours Pager #: [phone]

## 2016-03-17 ENCOUNTER — Encounter (HOSPITAL_COMMUNITY): Payer: Medicare Other

## 2016-03-17 ENCOUNTER — Inpatient Hospital Stay (HOSPITAL_COMMUNITY): Payer: Medicare Other

## 2016-03-17 DIAGNOSIS — J9383 Other pneumothorax: Secondary | ICD-10-CM

## 2016-03-17 LAB — BASIC METABOLIC PANEL
ANION GAP: 5 (ref 5–15)
BUN: 14 mg/dL (ref 6–20)
CALCIUM: 9.1 mg/dL (ref 8.9–10.3)
CO2: 24 mmol/L (ref 22–32)
Chloride: 111 mmol/L (ref 101–111)
Creatinine, Ser: 1.17 mg/dL — ABNORMAL HIGH (ref 0.44–1.00)
GFR, EST AFRICAN AMERICAN: 51 mL/min — AB (ref >60)
GFR, EST NON AFRICAN AMERICAN: 44 mL/min — AB (ref >60)
Glucose, Bld: 94 mg/dL (ref 65–99)
Potassium: 3.8 mmol/L (ref 3.5–5.1)
Sodium: 140 mmol/L (ref 135–145)

## 2016-03-17 NOTE — Progress Notes (Signed)
Referring Physician(s): Dr Einar Grad  Supervising Physician: Aletta Edouard  Patient Status:  [facility] - In-pt  Chief Complaint:  Right PTX Chest tube drain placed 1/23  Subjective:  Still painful at chest tube site---but better Up in bed CT to water seal per Dr Darcey Nora today + airleak OP 170 cc today- clear yellow fluid CXR today: IMPRESSION: 1. Port-A-Cath and right chest tube in stable position. Small right base pneumothorax again noted. 2. Postsurgical changes both lung lobes. Stable pleural-parenchymal thickening noted. Improved atelectasis left lung base.   Allergies: Oxaliplatin; Pantoprazole; Adhesive [tape]; Aspirin; Neulasta [pegfilgrastim]; Oxycodone; and Tramadol  Medications: Prior to Admission medications   Medication Sig Start Date End Date Taking? Authorizing Provider  amoxicillin (AMOXIL) 500 MG capsule Take 2,000 mg by mouth See admin instructions. One hour prior to dental appt (as a pre-treatment) 01/26/16  Yes Historical Provider, MD  CREON 36000 units CPEP capsule TAKE 1 CAPSULE THREE TIMES A DAY WITH MEALS 03/01/16  Yes Patrici Ranks, MD  hydrochlorothiazide (HYDRODIURIL) 25 MG tablet Take 12.5 mg by mouth every morning.    Yes Historical Provider, MD  lidocaine-prilocaine (EMLA) cream Apply a quarter size amount to port site 1 hour prior to chemo. Do not rub in. Cover with plastic wrap. 10/14/15  Yes Patrici Ranks, MD  loperamide (IMODIUM A-D) 2 MG tablet At the onset of diarrhea, take 2 tabs. Then take 1 tab every 2 hours until you have gone 12 hours without having a loose stool. If it is bedtime and you have a loose stool(s) take 2 tabs every 4 hours until morning. Call [place]. 10/14/15  Yes Patrici Ranks, MD  losartan (COZAAR) 100 MG tablet Take 100 mg by mouth every morning.  08/31/15  Yes Historical Provider, MD  metoprolol succinate (TOPROL-XL) 50 MG 24 hr tablet Take 25 mg by mouth every morning. Take with or immediately following  a meal.    Yes Historical Provider, MD  omeprazole (PRILOSEC) 20 MG capsule Take 20 mg by mouth daily before breakfast.    Yes Historical Provider, MD  ondansetron (ZOFRAN) 8 MG tablet Take 1 tablet (8 mg total) by mouth every 8 (eight) hours as needed for nausea or vomiting. 10/14/15  Yes Patrici Ranks, MD  Potassium Bicarb-Citric Acid (EFFER-K) 10 MEQ TBEF Take 1 tablet (10 mEq total) by mouth 2 (two) times daily. 12/08/15  Yes Patrici Ranks, MD  prochlorperazine (COMPAZINE) 10 MG tablet Take 1 tablet (10 mg total) by mouth every 6 (six) hours as needed for nausea or vomiting. 10/14/15  Yes Patrici Ranks, MD  XARELTO 20 MG TABS tablet Take 20 mg by mouth daily with supper.  08/04/15  Yes Historical Provider, MD     Vital Signs: BP (!) 145/75   Pulse 69   Temp 97.6 F (36.4 C) (Oral)   Resp 18   Ht '5\' 4"'$  (1.626 m)   Wt 118 lb 6.4 oz (53.7 kg)   SpO2 100%   BMI 20.32 kg/m   Physical Exam  Constitutional: She is oriented to person, place, and time. She appears well-nourished.  Pulmonary/Chest: Effort normal and breath sounds normal.  Abdominal: Soft.  Musculoskeletal: Normal range of motion.  Neurological: She is alert and oriented to person, place, and time.  Skin: Skin is warm and dry.  Rt chest tube drain intact Site clean and dry Tender No sign of infection + air leak 170 cc yellow fluid in Pleurvac   Psychiatric: She  has a normal mood and affect. Her behavior is normal.  Nursing note and vitals reviewed.   Imaging: Dg Chest 2 View  Result Date: 03/14/2016 CLINICAL DATA:  76 year old female with a past history of lung cancer and recent pneumothorax now with progressive shortness of breath on exertion. EXAM: CHEST  2 VIEW COMPARISON:  Prior chest x-rays including 03/05/2016 and 03/11/2016 FINDINGS: Cardiac and mediastinal contours remain unchanged. Atherosclerotic calcifications present in the transverse aorta. A single lumen port catheter is present overlying the  right chest. The catheter tip overlies the mid SVC. Interval development of a moderate right hydropneumothorax. The pneumothorax is slightly larger than that seen on January 13th. Patchy opacities projecting over the left lung base consistent with a combination of an expansile lytic lesion in the anterior aspect of the left fifth rib and linear pleuroparenchymal scarring in the left lung base. Biapical pleuroparenchymal scarring again noted. No acute osseous abnormality. IMPRESSION: 1. Moderate right-sided hydropneumothorax. Overall, the size of the pneumothorax is slightly larger than that seen on January 13th 2018. 2. Extensive bilateral pleuroparenchymal scarring and postsurgical changes again demonstrated. 3. Expansile lytic lesion in the anterolateral aspect of the left fifth rib. 4.  Aortic Atherosclerosis (ICD10-170.0). These results will be called to the ordering clinician or representative by the Radiologist Assistant, and communication documented in the PACS or zVision Dashboard. Electronically Signed   By: Jacqulynn Cadet M.D.   On: 03/14/2016 15:43   Ir Guided Niel Hummer W Catheter Placement  Result Date: 03/15/2016 INDICATION: Recurrent spontaneous right pneumothorax EXAM: ABSCESS DRAINAGE MEDICATIONS: 1% lidocaine locally ANESTHESIA/SEDATION: Fentanyl 50 mcg IV; Versed 1.0 mg IV Moderate Sedation Time:  10 The patient was continuously monitored during the procedure by the interventional radiology nurse under my direct supervision. COMPLICATIONS: None immediate. PROCEDURE: Informed written consent was obtained from the patient after a thorough discussion of the procedural risks, benefits and alternatives. All questions were addressed. Maximal Sterile Barrier Technique was utilized including caps, mask, sterile gowns, sterile gloves, sterile drape, hand hygiene and skin antiseptic. A timeout was performed prior to the initiation of the procedure. Previous imaging reviewed. Patient positioned supine.  Under fluoroscopy, the moderate right basilar pneumothorax was localized. In the mid axillary line, a lower right intercostal space was localized. Overlying skin marked. Under sterile conditions and local anesthesia, a 19 gauge introducer needle was advanced percutaneously into the right pleural space. There was return of air with syringe aspiration. Amplatz guidewire inserted. Tract dilatation performed to insert a 10 [place] drain. Retention loop formed in the pleural space. Position confirmed with fluoroscopy. Images obtained for documentation. Catheter secured with a Prolene suture and connected to a pleura vac. Sterile dressing applied. No immediate complication. Patient tolerated the procedure well. IMPRESSION: Successful fluoroscopic 10 French right basilar chest tube insertion. Electronically Signed   By: Jerilynn Mages.  Shick M.D.   On: 03/15/2016 15:30   Dg Chest Port 1 View  Result Date: 03/17/2016 CLINICAL DATA:  Chest tube. EXAM: PORTABLE CHEST 1 VIEW COMPARISON:  03/16/2016. FINDINGS: Port-A-Cath and right chest tube in stable position. Small right base pneumothorax noted on today's exam. Postsurgical changes both lungs again noted. Stable pleural-parenchymal thickening noted. Low lung volumes. Improved atelectasis left lung base. Heart size normal. IMPRESSION: 1. Port-A-Cath and right chest tube in stable position. Small right base pneumothorax again noted. 2. Postsurgical changes both lung lobes. Stable pleural-parenchymal thickening noted. Improved atelectasis left lung base. Critical Value/emergent results were called by telephone at the time of interpretation on 03/17/2016 at 7:56  am to nurse Ander Purpura , who verbally acknowledged these results. Electronically Signed   By: Marcello Moores  Register   On: 03/17/2016 07:58   Dg Chest Port 1 View  Result Date: 03/16/2016 CLINICAL DATA:  Status post right chest tube placement EXAM: PORTABLE CHEST 1 VIEW COMPARISON:  03/15/2016 FINDINGS: Cardiac shadow is stable.  Right-sided chest wall port is again seen. Chronic changes are noted within both lungs as well as extensive postsurgical changes bilaterally. These changes are stable from the prior exam. There is been interval re-expansion of the right lung when compared with the prior study secondary to chest tube placement in the right base. No significant residual pneumothorax is seen. IMPRESSION: No pneumothorax is noted on the right. Chronic changes bilaterally stable from the previous exam. Electronically Signed   By: Inez Catalina M.D.   On: 03/16/2016 08:23   Portable Chest 1 View  Result Date: 03/15/2016 CLINICAL DATA:  Pneumothorax. EXAM: PORTABLE CHEST 1 VIEW COMPARISON:  03/14/2016 FINDINGS: Right jugular Port-A-Cath is unchanged, terminating over the SVC. Cardiomediastinal silhouette is unchanged. Moderate-sized right pneumothorax has not significantly changed. There is underlying emphysema with postsurgical changes and extensive scarring in the lungs bilaterally. Patchy left basilar opacity is unchanged and corresponds to lung scarring and an anterior left fifth rib lesion as previously described. No large pleural effusion is identified. IMPRESSION: 1. Moderate right pneumothorax, unchanged. 2. Postsurgical changes in the lungs with extensive scarring. Electronically Signed   By: Logan Bores M.D.   On: 03/15/2016 07:41    Labs:  CBC:  Recent Labs  03/01/16 0910 03/05/16 2049 03/14/16 1732 03/14/16 1836  WBC 5.9 4.6 6.8 6.4  HGB 10.1* 9.6* 11.3* 10.3*  HCT 31.4* 29.8* 34.6* 31.7*  PLT 146* 173 181 152    COAGS:  Recent Labs  04/06/15 2136 04/19/15 2005 03/14/16 1732 03/15/16 0358  INR 1.11 1.09 1.38  --   APTT  --  27  --  29    BMP:  Recent Labs  03/05/16 2049 03/14/16 1732 03/15/16 0358 03/17/16 0326  NA 137 141 142 140  K 4.3 2.8* 2.6* 3.8  CL 106 105 107 111  CO2 21* '24 26 24  '$ GLUCOSE 121* 111* 110* 94  BUN '16 10 14 14  '$ CALCIUM 8.9 9.3 8.9 9.1  CREATININE 1.08*  1.05* 1.01* 1.17*  GFRNONAA 48* 50* 52* 44*  GFRAA 56* 58* >60 51*    LIVER FUNCTION TESTS:  Recent Labs  02/02/16 0936 02/16/16 0916 03/01/16 0913 03/15/16 0358  BILITOT 0.6 0.5 0.8 0.6  AST '16 17 19 17  '$ ALT 13* 11* 10* 11*  ALKPHOS 100 100 104 76  PROT 6.2* 6.4* 6.3* 5.6*  ALBUMIN 3.6 3.5 3.3* 3.0*    Assessment and Plan:  Right chest tube drain in place Water seal this am Plan per Dr Prescott Gum  Electronically Signed: Monia Sabal A 03/17/2016, 11:36 AM   I spent a total of 15 Minutes at the the patient's bedside AND on the patient's hospital floor or unit, greater than 50% of which was counseling/coordinating care for right chest tube drain

## 2016-03-17 NOTE — Discharge Summary (Signed)
Physician Discharge Summary  Patient ID: [name] [name] MRN: [id number] DOB/AGE: 78-Dec-1942 78 y.o.  Admit date: 03/14/2016 Discharge date: 03/18/2016  Admission Diagnoses:Recurrent pneumothorax(Right)  Discharge Diagnoses:  Active Problems:   Recurrent spontaneous pneumothorax   Malnutrition of moderate degree  Patient Active Problem List   Diagnosis Date Noted  . Malnutrition of moderate degree 03/16/2016  . Recurrent spontaneous pneumothorax 03/14/2016  . Pneumothorax on the right 03/05/2016  . Goals of care, counseling/discussion 02/02/2016  . Low serum vitamin B12 01/20/2016  . Low blood potassium 12/08/2015  . Pancreatic cancer metastasized to lung ([place]) 09/04/2015  . Pneumothorax, left 04/06/2015  . Cervical cancer ([place])   . Emphysema   . Hypertension   . Lung cancer ([place]) 06/11/2010  . Cancer ([place]) 08/21/2008    History of Present Illness:     76 year old Caucasian female with severe COPD, metastatic pancreatic cancer receiving chemotherapy by Dr. Whitney Muse, status post left VATS with stapling of blebs, resection of metastatic pancreatic cancer, and pleurodesis a year ago by myself now with symptomatic right spontaneous pneumothorax. She was hospitalized about a week ago and had a right chest tube placed by Dr. Cyndia Bent. This resolved her basilar right pneumothorax and the tube was subtotally removed and she was discharged. She started having shortness of breath however and was on room air at home. She presented to the office today with shortness of breath and oxygen saturation of 85% which dropped to 78% with ambulation. Chest x-ray performed shows recurrent right basilar pneumothorax, moderate. His left side has no pneumothorax. She is currently in the emergency department comfortable on nasal cannula oxygen with saturation 96%.  The patient will be prepared for pigtail catheter placement by interventional radiology in the right subpulmonic space . This will probably be left  in long-term as her general functional status is deteriorating along with weight loss from her metastatic cancer.   Discharged Condition: fair  Hospital Course: A pigtail catheter was placed by interventional radiology. She has a small basilar pneumothorax which appears stable. She is overall otherwise clinically stable. At time of discharge she will be attached to a mini expressed chest tube system and home health nursing will be arranged to assist with management.  Consults: Interventional radiology  Significant Diagnostic Studies: serial  CXR/routine labs  Treatments: IR placed a pigtail chest tube on right  Discharge Exam: Blood pressure (!) 141/66, pulse 64, temperature 97.5 F (36.4 C), temperature source Oral, resp. rate 18, height '5\' 4"'$  (1.626 m), weight 119 lb 9.6 oz (54.3 kg), SpO2 98 %.   Cardiovascular: RRR Pulmonary: Clear to auscultation on left and slightly diminished on right base   Disposition: 01-Home or Self Care   Allergies as of 03/18/2016      Reactions   Oxaliplatin Anaphylaxis   Pantoprazole Other (See Comments)   THIS GIVES THE PATIENT TERRIBLE HEARTBURN (PLEASE DO NOT GIVE)   Adhesive [tape] Rash   Aspirin Other (See Comments)   Burns stomach   Neulasta [pegfilgrastim] Other (See Comments)   UNSURE    Oxycodone Nausea And Vomiting   Tramadol Nausea And Vomiting      Medication List    TAKE these medications   acetaminophen 325 MG tablet Commonly known as:  TYLENOL Take 2 tablets (650 mg total) by mouth every 6 (six) hours as needed for mild pain (or Fever >/= 101).   amoxicillin 500 MG capsule Commonly known as:  AMOXIL Take 2,000 mg by mouth See admin instructions. One hour prior to  dental appt (as a pre-treatment)   CREON 36000 UNITS Cpep capsule Generic drug:  lipase/protease/amylase TAKE 1 CAPSULE THREE TIMES A DAY WITH MEALS   feeding supplement (ENSURE ENLIVE) Liqd Take 237 mLs by mouth 2 (two) times daily between meals.   folic  acid 1 MG tablet Commonly known as:  FOLVITE Take 1 tablet (1 mg total) by mouth daily. For one month then stop.   hydrochlorothiazide 25 MG tablet Commonly known as:  HYDRODIURIL Take 12.5 mg by mouth every morning.   ibuprofen 400 MG tablet Commonly known as:  ADVIL,MOTRIN Take 1 tablet (400 mg total) by mouth every 6 (six) hours as needed for mild pain.   lidocaine-prilocaine cream Commonly known as:  EMLA Apply a quarter size amount to port site 1 hour prior to chemo. Do not rub in. Cover with plastic wrap.   loperamide 2 MG tablet Commonly known as:  IMODIUM A-D At the onset of diarrhea, take 2 tabs. Then take 1 tab every 2 hours until you have gone 12 hours without having a loose stool. If it is bedtime and you have a loose stool(s) take 2 tabs every 4 hours until morning. Call [place].   losartan 100 MG tablet Commonly known as:  COZAAR Take 100 mg by mouth every morning.   metoprolol succinate 50 MG 24 hr tablet Commonly known as:  TOPROL-XL Take 25 mg by mouth every morning. Take with or immediately following a meal.   omeprazole 20 MG capsule Commonly known as:  PRILOSEC Take 20 mg by mouth daily before breakfast.   ondansetron 8 MG tablet Commonly known as:  ZOFRAN Take 1 tablet (8 mg total) by mouth every 8 (eight) hours as needed for nausea or vomiting.   Potassium Bicarb-Citric Acid 10 MEQ Tbef Commonly known as:  EFFER-K Take 1 tablet (10 mEq total) by mouth 2 (two) times daily.   prochlorperazine 10 MG tablet Commonly known as:  COMPAZINE Take 1 tablet (10 mg total) by mouth every 6 (six) hours as needed for nausea or vomiting.   XARELTO 20 MG Tabs tablet Generic drug:  rivaroxaban Take 20 mg by mouth daily with supper.      Follow-up Information    Tharon Aquas Trigt III, MD Follow up on 03/23/2016.   Specialty:  Cardiothoracic Surgery Why:  PA/LAT CXR to be taken (at [facility] which is in the same building as Dr. Lucianne Lei Trigt's office) on  03/23/2016 at 2:30 pm;Appointment time is at 3:00 pm Contact information:  [place] [address] [place] Follow up.   Why:  Please change chest tube dressing every other day.        [facility] Follow up.   Why:  HHRN for chest tube management Contact information: [place] [address] [phone]           Signed: Nani Skillern PA-C 03/18/2016, 2:44 PM

## 2016-03-17 NOTE — Care Management Note (Signed)
Case Management Note [name] Gibbons RN, BSN Unit 2W-Case Manager [phone]  Patient Details  Name: [name] [name] MRN: [id number] Date of Birth: [birth year]  Subjective/Objective:   Pt admitted with re-current pntx                 Action/Plan: PTA pt lived at home with sister- referral received for need of [facility]- plan for pt to return home with CT to mini express- spoke with pt at bedside- choice offered for [facility] agency- per pt she has used [facility] in past- would like to use them again- call made to  [name] [name] for [facility] referral- MD will need to place order for [facility] with F2F prior to discharge.   Expected Discharge Date:  03/17/16               Expected Discharge Plan:  [place]  In-House Referral:     Discharge planning Services  CM Consult  Post Acute Care Choice:  Home Health Choice offered to:  Patient  DME Arranged:    DME Agency:     HH Arranged:  RN [name] [name] Agency:  [name] [name]  Status of Service:  Completed, signed off  If discussed at [place] of Stay Meetings, dates discussed:    Discharge Disposition: Home with Home Health   Additional Comments:  [name] Patricia, RN 03/17/2016, 11:37 AM

## 2016-03-17 NOTE — Progress Notes (Addendum)
      [address]       [place], [address]             [phone]           Subjective: Patient eating breakfast. No specific complaints this am.  Objective: Vital signs in last 24 hours: Temp:  [97.7 F (36.5 C)-98.4 F (36.9 C)] 97.7 F (36.5 C) (01/25 0553) Pulse Rate:  [64-72] 65 (01/25 0553) Cardiac Rhythm: Normal sinus rhythm (01/24 1900) Resp:  [18] 18 (01/25 0553) BP: (114-146)/(63-72) 146/64 (01/25 0553) SpO2:  [100 %] 100 % (01/25 0553) Weight:  [118 lb 6.4 oz (53.7 kg)] 118 lb 6.4 oz (53.7 kg) (01/25 0553)     Intake/Output from previous day: 01/24 0701 - 01/25 0700 In: 720 [P.O.:720] Out: 36 [Chest Tube:36]   Physical Exam:  Cardiovascular: RRR Pulmonary: Clear to auscultation on left and slightly diminished on right base    Lab Results: CBC:  Recent Labs  03/14/16 1732 03/14/16 1836  WBC 6.8 6.4  HGB 11.3* 10.3*  HCT 34.6* 31.7*  PLT 181 152   BMET:   Recent Labs  03/15/16 0358 03/17/16 0326  NA 142 140  K 2.6* 3.8  CL 107 111  CO2 26 24  GLUCOSE 110* 94  BUN 14 14  CREATININE 1.01* 1.17*  CALCIUM 8.9 9.1    PT/INR:   Recent Labs  03/14/16 1732  LABPROT 17.1*  INR 1.38   ABG:  INR: Will add last result for INR, ABG once components are confirmed Will add last 4 CBG results once components are confirmed  Assessment/Plan:  1. CV - SR in the 70's. 2.  Pulmonary - Severe COPD, metastatic pancreatic ancer. S/p IR pigtail catheter. 30 cc of output last 24 hours. No air leak. CXR appears so show a small right basilar pneumothorax, extensive bilateral pleuroparenchymal scarring, and expansile lytic lesion in the anterolateral aspect of the left fifth rib. She is NOT a candidate for right VATS. As discussed with Dr. Prescott Gum, o place pigtail to mini express. 3. Patient states unable to take strong narcotic but is having a fair amount of pain at chest tube site. Will try Ibuprofen as unable to take Ultram or  codeine products. 4. Likely home in am  ZIMMERMAN,DONIELLE MPA-C 03/17/2016,7:54 AM Home in am w/ miniexpress- d/w patient Will need HHN patient examined and medical record reviewed,agree with above note. Tharon Aquas Trigt III 03/17/2016

## 2016-03-18 ENCOUNTER — Inpatient Hospital Stay (HOSPITAL_COMMUNITY): Payer: Medicare Other

## 2016-03-18 MED ORDER — IBUPROFEN 400 MG PO TABS: 400.0000 mg | ORAL_TABLET | Freq: Four times a day (QID) | ORAL | 0 refills | Status: DC | PRN

## 2016-03-18 MED ORDER — FOLIC ACID 1 MG PO TABS: 1.0000 mg | ORAL_TABLET | Freq: Every day | ORAL | Status: DC

## 2016-03-18 MED ORDER — ACETAMINOPHEN 325 MG PO TABS: 650.0000 mg | ORAL_TABLET | Freq: Four times a day (QID) | ORAL | Status: AC | PRN

## 2016-03-18 MED ORDER — ENSURE ENLIVE PO LIQD: 237.0000 mL | Freq: Two times a day (BID) | ORAL | 12 refills | Status: DC

## 2016-03-18 MED ORDER — HEPARIN SOD (PORK) LOCK FLUSH 100 UNIT/ML IV SOLN
500.0000 [IU] | INTRAVENOUS | Status: AC | PRN
  Administered 2016-03-18: 500 [IU]

## 2016-03-18 NOTE — Progress Notes (Addendum)
      [address]       ,[place] [address]             [phone]           Subjective: Patient without specific complaints this am.  Objective: Vital signs in last 24 hours: Temp:  [97.5 F (36.4 C)-97.9 F (36.6 C)] 97.5 F (36.4 C) (01/26 0524) Pulse Rate:  [66-75] 66 (01/26 0524) Cardiac Rhythm: Normal sinus rhythm (01/26 0723) Resp:  [14-18] 18 (01/26 0524) BP: (131-145)/(61-75) 139/67 (01/26 0524) SpO2:  [100 %] 100 % (01/26 0524) Weight:  [119 lb 9.6 oz (54.3 kg)] 119 lb 9.6 oz (54.3 kg) (01/26 0524)     Intake/Output from previous day: 01/25 0701 - 01/26 0700 In: 720 [P.O.:720] Out: 108 [Chest Tube:32]   Physical Exam:  Cardiovascular: RRR Pulmonary: Clear to auscultation on left and slightly diminished on right base    Lab Results: CBC: No results for input(s): WBC, HGB, HCT, PLT in the last 72 hours. BMET:   Recent Labs  03/17/16 0326  NA 140  K 3.8  CL 111  CO2 24  GLUCOSE 94  BUN 14  CREATININE 1.17*  CALCIUM 9.1    PT/INR:  No results for input(s): LABPROT, INR in the last 72 hours. ABG:  INR: Will add last result for INR, ABG once components are confirmed Will add last 4 CBG results once components are confirmed  Assessment/Plan:  1. CV - SR in the 60-70's. Will decrease Toprol to 25 as take prior to admission. Continue Losartan and HCTZ 2.  Pulmonary - Severe COPD, metastatic pancreatic ancer. S/p IR pigtail catheter on 01/23. Pigtail catheter to be placed to mini express today. 32 cc of output last 24 hours. No air leak. CXR this am appears stable (small right basilar ptx).  She is NOT a candidate for right VATS.  3. Once CXR read by radiologist, d/c home with [place] MPA-C 03/18/2016,7:45 AM

## 2016-03-18 NOTE — Progress Notes (Signed)
Pt has been discharged home. Pt left with all of her belongings. Pt left the unit via wheelchair and was accompanied by this RN and pt's sister.   Grant Fontana BSN, RN

## 2016-03-18 NOTE — Care Management Important Message (Signed)
Important Message  Patient Details  Name: [name] [name] MRN: [id number] Date of Birth: 03/15/1940   Medicare Important Message Given:  Yes    Nathen May 03/18/2016, 2:01 PM

## 2016-03-18 NOTE — Progress Notes (Signed)
Pt's chest tube has been connected with mini express system for discharge. Pt and pt's sister received discharge instructions and all questions were answered. Pt's port-a-cath has been de-accessed by IV team. CCMD has been notified about pt's discharge and telemetry box has been removed.   Grant Fontana BSN, RN

## 2016-03-18 NOTE — Discharge Instructions (Signed)
Pneumothorax Introduction A pneumothorax, commonly called a collapsed lung, is a condition in which air leaks from a lung and builds up in the space between the lung and the chest wall (pleural space). The air in a pneumothorax is trapped outside the lung and takes up space, preventing the lung from fully expanding. This is a condition that usually occurs suddenly. The buildup of air may be small or large. A small pneumothorax may go away on its own. When a pneumothorax is larger, it will often require medical treatment and hospitalization. What are the causes? A pneumothorax can sometimes happen quickly with no apparent cause. People with underlying lung problems, particularly COPD or emphysema, are at higher risk of pneumothorax. However, pneumothorax can happen quickly even in people with no prior known lung problems. Trauma, surgery, medical procedures, or injury to the chest wall can also cause a pneumothorax. What are the signs or symptoms? Sometimes a pneumothorax will have no symptoms. When symptoms are present, they can include:  Chest pain.  Shortness of breath.  Increased rate of breathing.  Bluish color to your lips or skin (cyanosis). How is this diagnosed? Pneumothorax is usually diagnosed by a chest X-ray or chest CT scan. Your health care provider will also take a medical history and perform a physical exam to determine why you may have a pneumothorax. How is this treated? A small pneumothorax may go away on its own without treatment. Extra oxygen can sometimes help a small pneumothorax go away more quickly. For a larger pneumothorax or a pneumothorax that is causing symptoms, a procedure is usually needed to drain the air.In some cases, the health care provider may drain the air using a needle. In other cases, a chest tube may be inserted into the pleural space. A chest tube is a small tube placed between the ribs and into the pleural space. This removes the extra air and  allows the lung to expand back to its normal size. A large pneumothorax will usually require a hospital stay. If there is ongoing air leakage into the pleural space, then the chest tube may need to remain in place for several days until the air leak has healed. In some cases, surgery may be needed. Follow these instructions at home:  Only take over-the-counter or prescription medicines as directed by your health care provider.  If a cough or pain makes it difficult for you to sleep at night, try sleeping in a semi-upright position in a recliner or by using 2 or 3 pillows.  Rest and limit activity as directed by your health care provider.  If you had a chest tube and it was removed, ask your health care provider when it is okay to remove the dressing. Until your health care provider says you can remove the dressing, do not allow it to get wet.  Do not smoke. Smoking is a risk factor for pneumothorax.  Do not fly in an airplane or scuba dive until your health care provider says it is okay.  Follow up with your health care provider as directed. Get help right away if:  You have increasing chest pain or shortness of breath.  You have a cough that is not controlled with suppressants.  You begin coughing up blood.  You have pain that is getting worse or is not controlled with medicines.  You cough up thick, discolored mucus (sputum) that is yellow to green in color.  You have redness, increasing pain, or discharge at the site where  a chest tube had been in place (if your pneumothorax was treated with a chest tube).  The site where your chest tube was located opens up.  You feel air coming out of the site where the chest tube was placed.  You have a fever or persistent symptoms for more than 2-3 days.  You have a fever and your symptoms suddenly get worse. This information is not intended to replace advice given to you by your health care provider. Make sure you discuss any questions  you have with your health care provider. Document Released: 02/07/2005 Document Revised: 07/16/2015 Document Reviewed: 07/03/2013  2017 Elsevier  Chest Tube Insertion, Adult, Care After This sheet gives you information about how to care for yourself after your procedure. Your health care provider may also give you more specific instructions. If you have problems or questions, contact your health care provider. What can I expect after the procedure? After the procedure, it is common to have:  Mild discomfort.  Slight bruising. Follow these instructions at home: Incision care  Follow instructions from your health care provider about how to take care of your incision. Make sure you:  Wash your hands with soap and water before you change your bandage (dressing). If soap and water are not available, use hand sanitizer.  Change your dressing as told by your health care provider.  Leave stitches (sutures), skin glue, or adhesive strips in place. These skin closures may need to stay in place for 2 weeks or longer. If adhesive strip edges start to loosen and curl up, you may trim the loose edges. Do not remove adhesive strips completely unless your health care provider tells you to do that.  Check your incision area every day for signs of infection. Check for:  More redness, swelling, or pain.  More fluid or blood.  Warmth.  Pus or a bad smell.  Do not take baths, swim, or use a hot tub until your health care provider approves. Ask your health care provider if you can take showers or have a sponge bath. You may need to keep the incision area dry for a few days. General instructions  Take over-the-counter and prescription medicines only as told by your health care provider.  Return to your normal activities as told by your health care provider. Ask your health care provider what activities are safe.  Keep all follow-up visits as told by your health care provider. This is  important.  Do not drive for 24 hours if you were given a medicine to help you relax (sedative). Contact a health care provider if:  You have chills or fever.  You have pain that is not controlled with medicine.  You have more redness, swelling, or pain around your incision.  You have more fluid or blood coming from your incision.  Your incision feels warm to the touch.  You have pus or a bad smell coming from the incision. Get help right away if:  You have chest pain or trouble breathing.  You develop red streaking on your skin around your incision. This information is not intended to replace advice given to you by your health care provider. Make sure you discuss any questions you have with your health care provider. Document Released: 11/28/2012 Document Revised: 08/28/2015 Document Reviewed: 07/22/2015 Elsevier Interactive Patient Education  2017 [employer].

## 2016-03-19 DIAGNOSIS — I10 Essential (primary) hypertension: Secondary | ICD-10-CM | POA: Diagnosis not present

## 2016-03-19 DIAGNOSIS — J9312 Secondary spontaneous pneumothorax: Secondary | ICD-10-CM | POA: Diagnosis not present

## 2016-03-19 DIAGNOSIS — C259 Malignant neoplasm of pancreas, unspecified: Secondary | ICD-10-CM | POA: Diagnosis not present

## 2016-03-19 DIAGNOSIS — Z4682 Encounter for fitting and adjustment of non-vascular catheter: Secondary | ICD-10-CM | POA: Diagnosis not present

## 2016-03-19 DIAGNOSIS — Z87891 Personal history of nicotine dependence: Secondary | ICD-10-CM | POA: Diagnosis not present

## 2016-03-19 DIAGNOSIS — Z8541 Personal history of malignant neoplasm of cervix uteri: Secondary | ICD-10-CM | POA: Diagnosis not present

## 2016-03-19 DIAGNOSIS — C7800 Secondary malignant neoplasm of unspecified lung: Secondary | ICD-10-CM | POA: Diagnosis not present

## 2016-03-19 DIAGNOSIS — J439 Emphysema, unspecified: Secondary | ICD-10-CM | POA: Diagnosis not present

## 2016-03-21 DIAGNOSIS — J9312 Secondary spontaneous pneumothorax: Secondary | ICD-10-CM | POA: Diagnosis not present

## 2016-03-21 DIAGNOSIS — C7800 Secondary malignant neoplasm of unspecified lung: Secondary | ICD-10-CM | POA: Diagnosis not present

## 2016-03-21 DIAGNOSIS — J439 Emphysema, unspecified: Secondary | ICD-10-CM | POA: Diagnosis not present

## 2016-03-21 DIAGNOSIS — I10 Essential (primary) hypertension: Secondary | ICD-10-CM | POA: Diagnosis not present

## 2016-03-21 DIAGNOSIS — C259 Malignant neoplasm of pancreas, unspecified: Secondary | ICD-10-CM | POA: Diagnosis not present

## 2016-03-21 DIAGNOSIS — Z4682 Encounter for fitting and adjustment of non-vascular catheter: Secondary | ICD-10-CM | POA: Diagnosis not present

## 2016-03-22 ENCOUNTER — Telehealth (HOSPITAL_COMMUNITY): Payer: Self-pay | Admitting: *Deleted

## 2016-03-22 ENCOUNTER — Telehealth: Payer: Self-pay | Admitting: Surgical

## 2016-03-22 DIAGNOSIS — C259 Malignant neoplasm of pancreas, unspecified: Secondary | ICD-10-CM | POA: Diagnosis not present

## 2016-03-22 DIAGNOSIS — C7800 Secondary malignant neoplasm of unspecified lung: Secondary | ICD-10-CM | POA: Diagnosis not present

## 2016-03-22 DIAGNOSIS — J439 Emphysema, unspecified: Secondary | ICD-10-CM | POA: Diagnosis not present

## 2016-03-22 DIAGNOSIS — J9312 Secondary spontaneous pneumothorax: Secondary | ICD-10-CM | POA: Diagnosis not present

## 2016-03-22 DIAGNOSIS — I10 Essential (primary) hypertension: Secondary | ICD-10-CM | POA: Diagnosis not present

## 2016-03-22 DIAGNOSIS — Z4682 Encounter for fitting and adjustment of non-vascular catheter: Secondary | ICD-10-CM | POA: Diagnosis not present

## 2016-03-22 NOTE — Telephone Encounter (Signed)
Spoke with patient via telephone and she advised [place] that she is going to see her doctor tomorrow to potentially remove the chest tube.  She is wanting to let us know so that we can maybe set her up for resuming chemo once it is out.

## 2016-03-22 NOTE — Telephone Encounter (Signed)
[address]       [place] [address]             [phone]          [address]       [place] [address]             [phone]    Astha M Younes [phone]   S/P right pigtail catheter for spont pnx performed on 1/23.  Discharged home on 1/26  Medications: Allergies as of 03/22/2016      Reactions   Oxaliplatin Anaphylaxis   Pantoprazole Other (See Comments)   THIS GIVES THE PATIENT TERRIBLE HEARTBURN (PLEASE DO NOT GIVE)   Adhesive [tape] Rash   Aspirin Other (See Comments)   Burns stomach   Neulasta [pegfilgrastim] Other (See Comments)   UNSURE    Oxycodone Nausea And Vomiting   Tramadol Nausea And Vomiting      Medication List       Accurate as of 03/22/16 12:17 PM. Always use your most recent med list.          acetaminophen 325 MG tablet Commonly known as:  TYLENOL Take 2 tablets (650 mg total) by mouth every 6 (six) hours as needed for mild pain (or Fever >/= 101).   amoxicillin 500 MG capsule Commonly known as:  AMOXIL Take 2,000 mg by mouth See admin instructions. One hour prior to dental appt (as a pre-treatment)   CREON 36000 UNITS Cpep capsule Generic drug:  lipase/protease/amylase TAKE 1 CAPSULE THREE TIMES A DAY WITH MEALS   feeding supplement (ENSURE ENLIVE) Liqd Take 237 mLs by mouth 2 (two) times daily between meals.   folic acid 1 MG tablet Commonly known as:  FOLVITE Take 1 tablet (1 mg total) by mouth daily. For one month then stop.   hydrochlorothiazide 25 MG tablet Commonly known as:  HYDRODIURIL Take 12.5 mg by mouth every morning.   ibuprofen 400 MG tablet Commonly known as:  ADVIL,MOTRIN Take 1 tablet (400 mg total) by mouth every 6 (six) hours as needed for mild pain.   lidocaine-prilocaine cream Commonly known as:  EMLA Apply a quarter size amount to port site 1 hour prior to chemo. Do not rub in. Cover with plastic wrap.   loperamide 2 MG tablet Commonly known as:   IMODIUM A-D At the onset of diarrhea, take 2 tabs. Then take 1 tab every 2 hours until you have gone 12 hours without having a loose stool. If it is bedtime and you have a loose stool(s) take 2 tabs every 4 hours until morning. Call [place].   losartan 100 MG tablet Commonly known as:  COZAAR Take 100 mg by mouth every morning.   metoprolol succinate 50 MG 24 hr tablet Commonly known as:  TOPROL-XL Take 25 mg by mouth every morning. Take with or immediately following a meal.   omeprazole 20 MG capsule Commonly known as:  PRILOSEC Take 20 mg by mouth daily before breakfast.   ondansetron 8 MG tablet Commonly known as:  ZOFRAN Take 1 tablet (8 mg total) by mouth every 8 (eight) hours as needed for nausea or vomiting.   Potassium Bicarb-Citric Acid 10 MEQ Tbef Commonly known as:  EFFER-K Take 1 tablet (10 mEq total) by mouth 2 (two) times daily.   prochlorperazine 10 MG tablet Commonly known as:  COMPAZINE Take 1 tablet (10 mg total) by mouth every 6 (six) hours as needed for nausea  or vomiting.   XARELTO 20 MG Tabs tablet Generic drug:  rivaroxaban Take 20 mg by mouth daily with supper.       Coumadin:  INR check no Problems/Concerns:feels like she is doing well. Some mild pain/mild  SOB  Assessment:  Patient is doing well.   Further instruction provided.  Contact office if concerns or problems develop  Follow up Appointment:Tomorrow with Dr Lucianne Lei Geralynn Rile, PA-C

## 2016-03-23 ENCOUNTER — Ambulatory Visit: Payer: Medicare Other | Admitting: Surgery

## 2016-03-23 ENCOUNTER — Other Ambulatory Visit: Payer: Self-pay | Admitting: Cardiothoracic Surgery

## 2016-03-23 ENCOUNTER — Ambulatory Visit
Admission: RE | Admit: 2016-03-23 | Discharge: 2016-03-23 | Disposition: A | Discharge status: Home / Self Care | Payer: Medicare Other | Source: Ambulatory Visit | Attending: Cardiothoracic Surgery | Admitting: Cardiothoracic Surgery

## 2016-03-23 ENCOUNTER — Ambulatory Visit (INDEPENDENT_AMBULATORY_CARE_PROVIDER_SITE_OTHER): Payer: Medicare Other | Admitting: Cardiothoracic Surgery

## 2016-03-23 VITALS — BP 138/75 | HR 98 | Resp 20 | Ht 64.0 in | Wt 119.0 lb

## 2016-03-23 DIAGNOSIS — Z9889 Other specified postprocedural states: Secondary | ICD-10-CM

## 2016-03-23 DIAGNOSIS — Z85118 Personal history of other malignant neoplasm of bronchus and lung: Secondary | ICD-10-CM

## 2016-03-23 DIAGNOSIS — IMO0002 Reserved for concepts with insufficient information to code with codable children: Secondary | ICD-10-CM

## 2016-03-23 DIAGNOSIS — J9383 Other pneumothorax: Secondary | ICD-10-CM

## 2016-03-23 DIAGNOSIS — C799 Secondary malignant neoplasm of unspecified site: Secondary | ICD-10-CM

## 2016-03-23 DIAGNOSIS — C259 Malignant neoplasm of pancreas, unspecified: Secondary | ICD-10-CM

## 2016-03-23 DIAGNOSIS — J939 Pneumothorax, unspecified: Secondary | ICD-10-CM

## 2016-03-23 DIAGNOSIS — J9382 Other air leak: Secondary | ICD-10-CM

## 2016-03-23 NOTE — Telephone Encounter (Signed)
Ok.  She is to call when tube is out.  Then we will get her in for an appointment to evaluate her prior to chemotherapy.  TK

## 2016-03-23 NOTE — Progress Notes (Signed)
PCP is Monico Blitz, MD Referring Provider is Kefalas, Manon Hilding, PA-C  Chief Complaint  Patient presents with  . Spontaneous Pneumothorax    s/p Chest Tube Placement 03/05/16    HPI: [facility] visit for spontaneous right pneumothorax related to intrathoracic malignancy-metastatic pancreatic cancer. The pneumothorax was subpulmonic and a IR CT-guided pigtail catheter was placed. She had a persistent air leak and was discharged home with the pigtail catheter connected to a mini express. She is been asymptomatic on room air at home. There is minimal amount of clear yellow fluid draining into the canister. She denies fever. She does have some dyspnea with exertion. This is baseline.  She complains of herpes zoster over the left anterior chest in the distribution of the fifth rib. She was started on oral Valtrex with improved symptoms  Past Medical History:  Diagnosis Date  . Abdominal wall hernia   . Anemia   . Aortic aneurysm (H[facility])    small aortic aneurysm  . Arthritis    "little in my right hand" (03/10/2016)  . Cervical cancer ([facility]) 1963  . COPD (chronic obstructive pulmonary disease) ([facility])   . Emphysema   . GERD (gastroesophageal reflux disease)   . Goals of care, counseling/discussion 02/02/2016  . Hypercholesterolemia   . Hypertension   . Pancreatic adenocarcinoma [facility]) July 2010  . Pancreatic cancer metastasized to lung [facility]) 09/04/2015    Past Surgical History:  Procedure Laterality Date  . BREAST CYST EXCISION Right 1988  . BUNIONECTOMY Bilateral 2007  . CARDIAC CATHETERIZATION    . CATARACT EXTRACTION W/ INTRAOCULAR LENS  IMPLANT, BILATERAL Bilateral 2009  . CHOLECYSTECTOMY  July 2010   done w/ Whipple procedure  . IR GENERIC HISTORICAL  03/15/2016   IR GUIDED DRAIN W CATHETER PLACEMENT 03/15/2016 Greggory Keen, MD [facility]-INTERV RAD  . SKIN CANCER EXCISION Left    nasal fold  . STAPLING OF BLEBS Left 04/20/2015   Procedure: STAPLING OF BLEBS;  Surgeon: Ivin Poot,  MD;  Location: [facility];  Service: Thoracic;  Laterality: Left;  . THYROID SURGERY     "had a growth taken off"  . TONSILLECTOMY  1943  . VAGINAL HYSTERECTOMY  1963   Cervical Cancer  . VIDEO ASSISTED THORACOSCOPY Left 04/20/2015   Procedure: VIDEO ASSISTED THORACOSCOPY;  Surgeon: Ivin Poot, MD;  Location: [facility];  Service: Thoracic;  Laterality: Left;  . WHIPPLE PROCEDURE  July 2010   pancreatic adenocarcinoma    Family History  Problem Relation Age of Onset  . Pancreatic cancer Brother   . Colon cancer Sister 85    12/2006 dx surgery and chemotherapy  . Ovarian cancer Sister 49    Ovarian ca,  surgery and chemotherapy    Social History Social History  Substance Use Topics  . Smoking status: Former Smoker    Packs/day: 1.00    Years: 30.00    Types: Cigarettes    Quit date: 02/29/1996  . Smokeless tobacco: Never Used  . Alcohol use No    Current Outpatient Prescriptions  Medication Sig Dispense Refill  . acetaminophen (TYLENOL) 325 MG tablet Take 2 tablets (650 mg total) by mouth every 6 (six) hours as needed for mild pain (or Fever >/= 101).    Marland Kitchen acyclovir (ZOVIRAX) 800 MG tablet 800 mg 3 (three) times daily.     Marland Kitchen amoxicillin (AMOXIL) 500 MG capsule Take 2,000 mg by mouth See admin instructions. One hour prior to dental appt (as a pre-treatment)    . CREON 36000  units CPEP capsule TAKE 1 CAPSULE THREE TIMES A DAY WITH MEALS 270 capsule 1  . feeding supplement, ENSURE ENLIVE, (ENSURE ENLIVE) LIQD Take 237 mLs by mouth 2 (two) times daily between meals. 161 mL 12  . folic acid (FOLVITE) 1 MG tablet Take 1 tablet (1 mg total) by mouth daily. For one month then stop.    . hydrochlorothiazide (HYDRODIURIL) 25 MG tablet Take 12.5 mg by mouth every morning.     Marland Kitchen ibuprofen (ADVIL,MOTRIN) 400 MG tablet Take 1 tablet (400 mg total) by mouth every 6 (six) hours as needed for mild pain. 30 tablet 0  . lidocaine-prilocaine (EMLA) cream Apply a quarter size amount to port site 1 hour  prior to chemo. Do not rub in. Cover with plastic wrap. 30 g 3  . loperamide (IMODIUM A-D) 2 MG tablet At the onset of diarrhea, take 2 tabs. Then take 1 tab every 2 hours until you have gone 12 hours without having a loose stool. If it is bedtime and you have a loose stool(s) take 2 tabs every 4 hours until morning. Call [place]. 60 tablet 1  . losartan (COZAAR) 100 MG tablet Take 100 mg by mouth every morning.     . metoprolol succinate (TOPROL-XL) 50 MG 24 hr tablet Take 25 mg by mouth every morning. Take with or immediately following a meal.     . omeprazole (PRILOSEC) 20 MG capsule Take 20 mg by mouth daily before breakfast.     . ondansetron (ZOFRAN) 8 MG tablet Take 1 tablet (8 mg total) by mouth every 8 (eight) hours as needed for nausea or vomiting. 30 tablet 2  . Potassium Bicarb-Citric Acid (EFFER-K) 10 MEQ TBEF Take 1 tablet (10 mEq total) by mouth 2 (two) times daily. 60 each 0  . prochlorperazine (COMPAZINE) 10 MG tablet Take 1 tablet (10 mg total) by mouth every 6 (six) hours as needed for nausea or vomiting. 30 tablet 2  . XARELTO 20 MG TABS tablet Take 20 mg by mouth daily with supper.      No current facility-administered medications for this visit.    Facility-Administered Medications Ordered in Other Visits  Medication Dose Route Frequency Provider Last Rate Last Dose  . sodium chloride flush (NS) 0.9 % injection 10 mL  10 mL Intracatheter PRN Patrici Ranks, MD        Allergies  Allergen Reactions  . Oxaliplatin Anaphylaxis  . Pantoprazole Other (See Comments)    THIS GIVES THE PATIENT TERRIBLE HEARTBURN (PLEASE DO NOT GIVE)  . Adhesive [Tape] Rash  . Aspirin Other (See Comments)    Burns stomach  . Neulasta [Pegfilgrastim] Other (See Comments)    UNSURE   . Oxycodone Nausea And Vomiting  . Tramadol Nausea And Vomiting    Review of Systems  Minimal dyspnea on exertion Minimal drainage from pigtail catheter in right pleural space  BP 138/75   Pulse 98    Resp 20   Ht '5\' 4"'$  (1.626 m)   Wt 119 lb (54 kg)   SpO2 94%   BMI 20.43 kg/m  Physical Exam Alert and pleasant appears well Breath sounds slightly diminished on the right Old chest tube suture removed from right side Pigtail catheter site examined and redressed-clean and dry Catheter examined for air leak and is positive for a small air leak-we'll leave catheter in place connected to a mini express Diagnostic Tests: Chest x-ray with small right basilar hydro-- pneumothorax  Impression: Persistent air leak from  probable malignant intrathoracic disease-metastatic pancreatic cancer as well as history of bullous emphysema  Plan: Pigtail catheter to remain for another week with home health nurse support I will see the patient back with a chest x-ray in 1 week to assess for air leak and to determine whether the catheter can be removed   Len Childs, MD [facility] [phone]

## 2016-03-24 ENCOUNTER — Telehealth (HOSPITAL_COMMUNITY): Payer: Self-pay | Admitting: *Deleted

## 2016-03-24 DIAGNOSIS — J439 Emphysema, unspecified: Secondary | ICD-10-CM | POA: Diagnosis not present

## 2016-03-24 DIAGNOSIS — C259 Malignant neoplasm of pancreas, unspecified: Secondary | ICD-10-CM | POA: Diagnosis not present

## 2016-03-24 DIAGNOSIS — C7800 Secondary malignant neoplasm of unspecified lung: Secondary | ICD-10-CM | POA: Diagnosis not present

## 2016-03-24 DIAGNOSIS — Z4682 Encounter for fitting and adjustment of non-vascular catheter: Secondary | ICD-10-CM | POA: Diagnosis not present

## 2016-03-24 DIAGNOSIS — J9312 Secondary spontaneous pneumothorax: Secondary | ICD-10-CM | POA: Diagnosis not present

## 2016-03-24 DIAGNOSIS — I10 Essential (primary) hypertension: Secondary | ICD-10-CM | POA: Diagnosis not present

## 2016-03-29 ENCOUNTER — Other Ambulatory Visit: Payer: Self-pay | Admitting: Cardiothoracic Surgery

## 2016-03-29 DIAGNOSIS — C3490 Malignant neoplasm of unspecified part of unspecified bronchus or lung: Secondary | ICD-10-CM

## 2016-03-30 ENCOUNTER — Ambulatory Visit
Admission: RE | Admit: 2016-03-30 | Discharge: 2016-03-30 | Disposition: A | Discharge status: Home / Self Care | Payer: Medicare Other | Source: Ambulatory Visit | Attending: Cardiothoracic Surgery | Admitting: Cardiothoracic Surgery

## 2016-03-30 ENCOUNTER — Ambulatory Visit (INDEPENDENT_AMBULATORY_CARE_PROVIDER_SITE_OTHER): Payer: Medicare Other | Admitting: Physician Assistant

## 2016-03-30 VITALS — BP 140/74 | HR 54 | Resp 20 | Ht 64.0 in | Wt 119.0 lb

## 2016-03-30 DIAGNOSIS — Z09 Encounter for follow-up examination after completed treatment for conditions other than malignant neoplasm: Secondary | ICD-10-CM | POA: Diagnosis not present

## 2016-03-30 DIAGNOSIS — C259 Malignant neoplasm of pancreas, unspecified: Secondary | ICD-10-CM

## 2016-03-30 DIAGNOSIS — C799 Secondary malignant neoplasm of unspecified site: Secondary | ICD-10-CM

## 2016-03-30 DIAGNOSIS — C3491 Malignant neoplasm of unspecified part of right bronchus or lung: Secondary | ICD-10-CM | POA: Diagnosis not present

## 2016-03-30 DIAGNOSIS — C3490 Malignant neoplasm of unspecified part of unspecified bronchus or lung: Secondary | ICD-10-CM

## 2016-03-30 DIAGNOSIS — C3412 Malignant neoplasm of upper lobe, left bronchus or lung: Secondary | ICD-10-CM | POA: Diagnosis not present

## 2016-03-30 DIAGNOSIS — IMO0002 Reserved for concepts with insufficient information to code with codable children: Secondary | ICD-10-CM

## 2016-03-30 DIAGNOSIS — J9382 Other air leak: Secondary | ICD-10-CM

## 2016-03-30 DIAGNOSIS — J939 Pneumothorax, unspecified: Secondary | ICD-10-CM | POA: Diagnosis not present

## 2016-03-30 NOTE — Progress Notes (Signed)
HPI:  Patient returns one week follow up for spontaneous right pneumothorax related to intrathoracic malignancy-metastatic pancreatic cancer. The pneumothorax was subpulmonic and a IR CT-guided pigtail catheter was placed. She had a persistent air leak and was discharged home with the pigtail catheter connected to a mini express. She continues to do well.  She states she is ready to get rid of the chest tube.  Her shingles that developed have scabbed over but she continues to have some mild pain associated with this.  Drainage from her chest tube remains minimal.  Current Outpatient Prescriptions  Medication Sig Dispense Refill  . acetaminophen (TYLENOL) 325 MG tablet Take 2 tablets (650 mg total) by mouth every 6 (six) hours as needed for mild pain (or Fever >/= 101).    Marland Kitchen acyclovir (ZOVIRAX) 800 MG tablet 800 mg 3 (three) times daily.     Marland Kitchen amoxicillin (AMOXIL) 500 MG capsule Take 2,000 mg by mouth See admin instructions. One hour prior to dental appt (as a pre-treatment)    . CREON 36000 units CPEP capsule TAKE 1 CAPSULE THREE TIMES A DAY WITH MEALS 270 capsule 1  . feeding supplement, ENSURE ENLIVE, (ENSURE ENLIVE) LIQD Take 237 mLs by mouth 2 (two) times daily between meals. 621 mL 12  . folic acid (FOLVITE) 1 MG tablet Take 1 tablet (1 mg total) by mouth daily. For one month then stop.    . hydrochlorothiazide (HYDRODIURIL) 25 MG tablet Take 12.5 mg by mouth every morning.     Marland Kitchen ibuprofen (ADVIL,MOTRIN) 400 MG tablet Take 1 tablet (400 mg total) by mouth every 6 (six) hours as needed for mild pain. 30 tablet 0  . lidocaine-prilocaine (EMLA) cream Apply a quarter size amount to port site 1 hour prior to chemo. Do not rub in. Cover with plastic wrap. 30 g 3  . loperamide (IMODIUM A-D) 2 MG tablet At the onset of diarrhea, take 2 tabs. Then take 1 tab every 2 hours until you have gone 12 hours without having a loose stool. If it is bedtime and you have a loose stool(s) take 2 tabs every 4 hours  until morning. Call [place]. 60 tablet 1  . losartan (COZAAR) 100 MG tablet Take 100 mg by mouth every morning.     . metoprolol succinate (TOPROL-XL) 50 MG 24 hr tablet Take 25 mg by mouth every morning. Take with or immediately following a meal.     . omeprazole (PRILOSEC) 20 MG capsule Take 20 mg by mouth daily before breakfast.     . ondansetron (ZOFRAN) 8 MG tablet Take 1 tablet (8 mg total) by mouth every 8 (eight) hours as needed for nausea or vomiting. 30 tablet 2  . Potassium Bicarb-Citric Acid (EFFER-K) 10 MEQ TBEF Take 1 tablet (10 mEq total) by mouth 2 (two) times daily. 60 each 0  . prochlorperazine (COMPAZINE) 10 MG tablet Take 1 tablet (10 mg total) by mouth every 6 (six) hours as needed for nausea or vomiting. 30 tablet 2  . XARELTO 20 MG TABS tablet Take 20 mg by mouth daily with supper.      No current facility-administered medications for this visit.    Facility-Administered Medications Ordered in Other Visits  Medication Dose Route Frequency Provider Last Rate Last Dose  . sodium chloride flush (NS) 0.9 % injection 10 mL  10 mL Intracatheter PRN Patrici Ranks, MD        Physical Exam:  Gen: no apparent distress Heart: RRR Lungs: Diminished bibasilar  Wound: clean and dry, + air leak (minimal)  Diagnostic Tests:  CXR: stable appearance of pleural effusion, small improvement in right basilar hydropneumothorax  A/P:  1. Spontaneous right pneumothorax related to intrathoracic malignancy-metastatic pancreatic cancer---- pigtail remains in place connect to Mini Express... Minimal air leak present 2. Dispo- minimal air leak present, will bring patient back to clinic in 1 week for repeat CXR and can hopefully remove chest tube at that time   Ellwood Handler, PA-C [facility] [phone]

## 2016-03-31 ENCOUNTER — Encounter: Payer: Medicare Other | Admitting: Cardiothoracic Surgery

## 2016-03-31 DIAGNOSIS — J439 Emphysema, unspecified: Secondary | ICD-10-CM | POA: Diagnosis not present

## 2016-03-31 DIAGNOSIS — C259 Malignant neoplasm of pancreas, unspecified: Secondary | ICD-10-CM | POA: Diagnosis not present

## 2016-03-31 DIAGNOSIS — J9312 Secondary spontaneous pneumothorax: Secondary | ICD-10-CM | POA: Diagnosis not present

## 2016-03-31 DIAGNOSIS — C7800 Secondary malignant neoplasm of unspecified lung: Secondary | ICD-10-CM | POA: Diagnosis not present

## 2016-03-31 DIAGNOSIS — Z4682 Encounter for fitting and adjustment of non-vascular catheter: Secondary | ICD-10-CM | POA: Diagnosis not present

## 2016-03-31 DIAGNOSIS — I10 Essential (primary) hypertension: Secondary | ICD-10-CM | POA: Diagnosis not present

## 2016-04-06 ENCOUNTER — Other Ambulatory Visit: Payer: Self-pay | Admitting: Cardiothoracic Surgery

## 2016-04-06 DIAGNOSIS — J9383 Other pneumothorax: Secondary | ICD-10-CM

## 2016-04-07 ENCOUNTER — Encounter: Payer: Self-pay | Admitting: Cardiothoracic Surgery

## 2016-04-07 ENCOUNTER — Ambulatory Visit
Admission: RE | Admit: 2016-04-07 | Discharge: 2016-04-07 | Disposition: A | Discharge status: Home / Self Care | Payer: Medicare Other | Source: Ambulatory Visit | Attending: Cardiothoracic Surgery | Admitting: Cardiothoracic Surgery

## 2016-04-07 ENCOUNTER — Other Ambulatory Visit: Payer: Self-pay | Admitting: *Deleted

## 2016-04-07 ENCOUNTER — Ambulatory Visit (INDEPENDENT_AMBULATORY_CARE_PROVIDER_SITE_OTHER): Payer: Medicare Other | Admitting: Cardiothoracic Surgery

## 2016-04-07 VITALS — BP 110/58 | HR 80 | Resp 20 | Ht 64.0 in | Wt 119.0 lb

## 2016-04-07 DIAGNOSIS — Z09 Encounter for follow-up examination after completed treatment for conditions other than malignant neoplasm: Secondary | ICD-10-CM

## 2016-04-07 DIAGNOSIS — C3412 Malignant neoplasm of upper lobe, left bronchus or lung: Secondary | ICD-10-CM

## 2016-04-07 DIAGNOSIS — J9383 Other pneumothorax: Secondary | ICD-10-CM

## 2016-04-07 DIAGNOSIS — J939 Pneumothorax, unspecified: Secondary | ICD-10-CM

## 2016-04-07 DIAGNOSIS — IMO0002 Reserved for concepts with insufficient information to code with codable children: Secondary | ICD-10-CM

## 2016-04-07 DIAGNOSIS — J9311 Primary spontaneous pneumothorax: Secondary | ICD-10-CM | POA: Diagnosis not present

## 2016-04-07 DIAGNOSIS — J9382 Other air leak: Secondary | ICD-10-CM

## 2016-04-07 NOTE — Progress Notes (Signed)
PCP is Monico Blitz, MD Referring Provider is Kefalas, Manon Hilding, PA-C  Chief Complaint  Patient presents with  . Routine Post Op    1 week f/u with CXR mini express in place for persistent air leak    HPI: The patient returns for follow-up of a right pigtail catheter placed in pleural space for recurrent spontaneous pneumothorax due to history of bullous emphysema as well as metastatic pancreatic cancer. The cath was placed by IR and has been in place for approximately 3 weeks. The patient has been at home. There has been minimal drainage. Today chest x-ray shows no significant pneumothorax. Examination of the mini express chamber shows no evidence of air leak. The chest tube was removed in the office today. Good breath sounds are present following removal tube  Past Medical History:  Diagnosis Date  . Abdominal wall hernia   . Anemia   . Aortic aneurysm (HCC)    small aortic aneurysm  . Arthritis    "little in my right hand" (03/10/2016)  . Cervical cancer ([name]) 1963  . COPD (chronic obstructive pulmonary disease) ([place])   . Emphysema   . GERD (gastroesophageal reflux disease)   . Goals of care, counseling/discussion 02/02/2016  . Hypercholesterolemia   . Hypertension   . Pancreatic adenocarcinoma [facility]) July 2010  . Pancreatic cancer metastasized to lung [facility]) 09/04/2015    Past Surgical History:  Procedure Laterality Date  . BREAST CYST EXCISION Right 1988  . BUNIONECTOMY Bilateral 2007  . CARDIAC CATHETERIZATION    . CATARACT EXTRACTION W/ INTRAOCULAR LENS  IMPLANT, BILATERAL Bilateral 2009  . CHOLECYSTECTOMY  July 2010   done w/ Whipple procedure  . IR GENERIC HISTORICAL  03/15/2016   IR GUIDED DRAIN W CATHETER PLACEMENT 03/15/2016 Greggory Keen, MD [facility]-INTERV RAD  . SKIN CANCER EXCISION Left    nasal fold  . STAPLING OF BLEBS Left 04/20/2015   Procedure: STAPLING OF BLEBS;  Surgeon: Ivin Poot, MD;  Location: [place];  Service: Thoracic;  Laterality: Left;  . THYROID  SURGERY     "had a growth taken off"  . TONSILLECTOMY  1943  . VAGINAL HYSTERECTOMY  1963   Cervical Cancer  . VIDEO ASSISTED THORACOSCOPY Left 04/20/2015   Procedure: VIDEO ASSISTED THORACOSCOPY;  Surgeon: Ivin Poot, MD;  Location: [place];  Service: Thoracic;  Laterality: Left;  . WHIPPLE PROCEDURE  July 2010   pancreatic adenocarcinoma    Family History  Problem Relation Age of Onset  . Pancreatic cancer Brother   . Colon cancer Sister 79    12/2006 dx surgery and chemotherapy  . Ovarian cancer Sister 40    Ovarian ca,  surgery and chemotherapy    Social History Social History  Substance Use Topics  . Smoking status: Former Smoker    Packs/day: 1.00    Years: 30.00    Types: Cigarettes    Quit date: 02/29/1996  . Smokeless tobacco: Never Used  . Alcohol use No    Current Outpatient Prescriptions  Medication Sig Dispense Refill  . acetaminophen (TYLENOL) 325 MG tablet Take 2 tablets (650 mg total) by mouth every 6 (six) hours as needed for mild pain (or Fever >/= 101).    Marland Kitchen acyclovir (ZOVIRAX) 800 MG tablet 800 mg 3 (three) times daily.     Marland Kitchen amoxicillin (AMOXIL) 500 MG capsule Take 2,000 mg by mouth See admin instructions. One hour prior to dental appt (as a pre-treatment)    . CREON 36000 units CPEP capsule TAKE  1 CAPSULE THREE TIMES A DAY WITH MEALS 270 capsule 1  . feeding supplement, ENSURE ENLIVE, (ENSURE ENLIVE) LIQD Take 237 mLs by mouth 2 (two) times daily between meals. 250 mL 12  . folic acid (FOLVITE) 1 MG tablet Take 1 tablet (1 mg total) by mouth daily. For one month then stop.    . hydrochlorothiazide (HYDRODIURIL) 25 MG tablet Take 12.5 mg by mouth every morning.     Marland Kitchen ibuprofen (ADVIL,MOTRIN) 400 MG tablet Take 1 tablet (400 mg total) by mouth every 6 (six) hours as needed for mild pain. 30 tablet 0  . lidocaine-prilocaine (EMLA) cream Apply a quarter size amount to port site 1 hour prior to chemo. Do not rub in. Cover with plastic wrap. 30 g 3  .  loperamide (IMODIUM A-D) 2 MG tablet At the onset of diarrhea, take 2 tabs. Then take 1 tab every 2 hours until you have gone 12 hours without having a loose stool. If it is bedtime and you have a loose stool(s) take 2 tabs every 4 hours until morning. Call [place]. 60 tablet 1  . losartan (COZAAR) 100 MG tablet Take 100 mg by mouth every morning.     . metoprolol succinate (TOPROL-XL) 50 MG 24 hr tablet Take 25 mg by mouth every morning. Take with or immediately following a meal.     . omeprazole (PRILOSEC) 20 MG capsule Take 20 mg by mouth daily before breakfast.     . ondansetron (ZOFRAN) 8 MG tablet Take 1 tablet (8 mg total) by mouth every 8 (eight) hours as needed for nausea or vomiting. 30 tablet 2  . Potassium Bicarb-Citric Acid (EFFER-K) 10 MEQ TBEF Take 1 tablet (10 mEq total) by mouth 2 (two) times daily. 60 each 0  . prochlorperazine (COMPAZINE) 10 MG tablet Take 1 tablet (10 mg total) by mouth every 6 (six) hours as needed for nausea or vomiting. 30 tablet 2  . XARELTO 20 MG TABS tablet Take 20 mg by mouth daily with supper.      No current facility-administered medications for this visit.    Facility-Administered Medications Ordered in Other Visits  Medication Dose Route Frequency Provider Last Rate Last Dose  . sodium chloride flush (NS) 0.9 % injection 10 mL  10 mL Intracatheter PRN Patrici Ranks, MD        Allergies  Allergen Reactions  . Oxaliplatin Anaphylaxis  . Pantoprazole Other (See Comments)    THIS GIVES THE PATIENT TERRIBLE HEARTBURN (PLEASE DO NOT GIVE)  . Adhesive [Tape] Rash  . Aspirin Other (See Comments)    Burns stomach  . Neulasta [Pegfilgrastim] Other (See Comments)    UNSURE   . Oxycodone Nausea And Vomiting  . Tramadol Nausea And Vomiting    Review of Systems  No shortness of breath No drainage around the catheter site No fever  BP (!) 110/58   Pulse 80   Resp 20   Ht '5\' 4"'$  (1.626 m)   Wt 119 lb (54 kg)   BMI 20.43 kg/m  Physical  Exam Breath sounds equal with scattered rhonchi Catheter removed without difficulty and tight dressing applied Neuro intact  Diagnostic Tests: Chest x-ray before removal catheter shows catheter in good position without pneumothorax  Impression: Successful treatment of spontaneous right sided pneumothorax with IR catheter  Plan: Return with chest x-ray in 2 weeks for follow-up   Len Childs, MD [facility] [phone]

## 2016-04-08 DIAGNOSIS — I10 Essential (primary) hypertension: Secondary | ICD-10-CM | POA: Diagnosis not present

## 2016-04-08 DIAGNOSIS — J439 Emphysema, unspecified: Secondary | ICD-10-CM | POA: Diagnosis not present

## 2016-04-08 DIAGNOSIS — C7800 Secondary malignant neoplasm of unspecified lung: Secondary | ICD-10-CM | POA: Diagnosis not present

## 2016-04-08 DIAGNOSIS — J9312 Secondary spontaneous pneumothorax: Secondary | ICD-10-CM | POA: Diagnosis not present

## 2016-04-08 DIAGNOSIS — Z4682 Encounter for fitting and adjustment of non-vascular catheter: Secondary | ICD-10-CM | POA: Diagnosis not present

## 2016-04-08 DIAGNOSIS — C259 Malignant neoplasm of pancreas, unspecified: Secondary | ICD-10-CM | POA: Diagnosis not present

## 2016-04-15 ENCOUNTER — Other Ambulatory Visit: Payer: Self-pay | Admitting: *Deleted

## 2016-04-15 DIAGNOSIS — C3412 Malignant neoplasm of upper lobe, left bronchus or lung: Secondary | ICD-10-CM

## 2016-04-15 DIAGNOSIS — J9383 Other pneumothorax: Secondary | ICD-10-CM

## 2016-04-19 ENCOUNTER — Ambulatory Visit
Admission: RE | Admit: 2016-04-19 | Discharge: 2016-04-19 | Disposition: A | Discharge status: Home / Self Care | Payer: Medicare Other | Source: Ambulatory Visit | Attending: Cardiothoracic Surgery | Admitting: Cardiothoracic Surgery

## 2016-04-19 ENCOUNTER — Ambulatory Visit (INDEPENDENT_AMBULATORY_CARE_PROVIDER_SITE_OTHER): Payer: Medicare Other | Admitting: Cardiothoracic Surgery

## 2016-04-19 ENCOUNTER — Encounter: Payer: Self-pay | Admitting: Cardiothoracic Surgery

## 2016-04-19 VITALS — BP 122/75 | HR 75 | Resp 20 | Ht 64.0 in | Wt 119.0 lb

## 2016-04-19 DIAGNOSIS — J939 Pneumothorax, unspecified: Secondary | ICD-10-CM

## 2016-04-19 DIAGNOSIS — C3412 Malignant neoplasm of upper lobe, left bronchus or lung: Secondary | ICD-10-CM

## 2016-04-19 DIAGNOSIS — J9382 Other air leak: Secondary | ICD-10-CM | POA: Diagnosis not present

## 2016-04-19 DIAGNOSIS — R05 Cough: Secondary | ICD-10-CM | POA: Diagnosis not present

## 2016-04-19 DIAGNOSIS — J9383 Other pneumothorax: Secondary | ICD-10-CM

## 2016-04-19 DIAGNOSIS — IMO0002 Reserved for concepts with insufficient information to code with codable children: Secondary | ICD-10-CM

## 2016-04-19 NOTE — Progress Notes (Signed)
PCP is Monico Blitz, MD Referring Provider is Kefalas, Manon Hilding, PA-C  Chief Complaint  Patient presents with  . Pleural Effusion    2 week f/u with CXR    HPI: Routine follow-up with chest x-ray Patient had interventional radiology right pigtail catheter placed for spontaneous pneumothorax which was recurrent after right chest tube.  The patient had the pigtail catheter removed at last office visit and demonstrated no air leak.  Chest x-ray today shows a very small right basilar hydropneumothorax clinically insignificant.   The patient has severe COPD, pulmonary metastatic disease from pancreatic cancer and is a poor operative candidate. She had the same problem on the left side and required a VATS which took her several weeks to recover  The patient is going to start her routine chemotherapy cycles later this month for her metastatic pancreatic cancer.  Past Medical History:  Diagnosis Date  . Abdominal wall hernia   . Anemia   . Aortic aneurysm (HCC)    small aortic aneurysm  . Arthritis    "little in my right hand" (03/10/2016)  . Cervical cancer ([place]) 1963  . COPD (chronic obstructive pulmonary disease) ([place])   . Emphysema   . GERD (gastroesophageal reflux disease)   . Goals of care, counseling/discussion 02/02/2016  . Hypercholesterolemia   . Hypertension   . Pancreatic adenocarcinoma [facility]) July 2010  . Pancreatic cancer metastasized to lung [facility]) 09/04/2015    Past Surgical History:  Procedure Laterality Date  . BREAST CYST EXCISION Right 1988  . BUNIONECTOMY Bilateral 2007  . CARDIAC CATHETERIZATION    . CATARACT EXTRACTION W/ INTRAOCULAR LENS  IMPLANT, BILATERAL Bilateral 2009  . CHOLECYSTECTOMY  July 2010   done w/ Whipple procedure  . IR GENERIC HISTORICAL  03/15/2016   IR GUIDED DRAIN W CATHETER PLACEMENT 03/15/2016 Greggory Keen, MD [facility]-INTERV RAD  . SKIN CANCER EXCISION Left    nasal fold  . STAPLING OF BLEBS Left 04/20/2015   Procedure: STAPLING OF BLEBS;   Surgeon: Ivin Poot, MD;  Location: [place];  Service: Thoracic;  Laterality: Left;  . THYROID SURGERY     "had a growth taken off"  . TONSILLECTOMY  1943  . VAGINAL HYSTERECTOMY  1963   Cervical Cancer  . VIDEO ASSISTED THORACOSCOPY Left 04/20/2015   Procedure: VIDEO ASSISTED THORACOSCOPY;  Surgeon: Ivin Poot, MD;  Location: [place];  Service: Thoracic;  Laterality: Left;  . WHIPPLE PROCEDURE  July 2010   pancreatic adenocarcinoma    Family History  Problem Relation Age of Onset  . Pancreatic cancer Brother   . Colon cancer Sister 4    12/2006 dx surgery and chemotherapy  . Ovarian cancer Sister 38    Ovarian ca,  surgery and chemotherapy    Social History Social History  Substance Use Topics  . Smoking status: Former Smoker    Packs/day: 1.00    Years: 30.00    Types: Cigarettes    Quit date: 02/29/1996  . Smokeless tobacco: Never Used  . Alcohol use No    Current Outpatient Prescriptions  Medication Sig Dispense Refill  . acetaminophen (TYLENOL) 325 MG tablet Take 2 tablets (650 mg total) by mouth every 6 (six) hours as needed for mild pain (or Fever >/= 101).    Marland Kitchen acyclovir (ZOVIRAX) 800 MG tablet 800 mg 3 (three) times daily.     Marland Kitchen amoxicillin (AMOXIL) 500 MG capsule Take 2,000 mg by mouth See admin instructions. One hour prior to dental appt (as a pre-treatment)    .  CREON 36000 units CPEP capsule TAKE 1 CAPSULE THREE TIMES A DAY WITH MEALS 270 capsule 1  . feeding supplement, ENSURE ENLIVE, (ENSURE ENLIVE) LIQD Take 237 mLs by mouth 2 (two) times daily between meals. 585 mL 12  . folic acid (FOLVITE) 1 MG tablet Take 1 tablet (1 mg total) by mouth daily. For one month then stop.    . hydrochlorothiazide (HYDRODIURIL) 25 MG tablet Take 12.5 mg by mouth every morning.     Marland Kitchen ibuprofen (ADVIL,MOTRIN) 400 MG tablet Take 1 tablet (400 mg total) by mouth every 6 (six) hours as needed for mild pain. 30 tablet 0  . lidocaine-prilocaine (EMLA) cream Apply a quarter size  amount to port site 1 hour prior to chemo. Do not rub in. Cover with plastic wrap. 30 g 3  . loperamide (IMODIUM A-D) 2 MG tablet At the onset of diarrhea, take 2 tabs. Then take 1 tab every 2 hours until you have gone 12 hours without having a loose stool. If it is bedtime and you have a loose stool(s) take 2 tabs every 4 hours until morning. Call [place]. 60 tablet 1  . losartan (COZAAR) 100 MG tablet Take 100 mg by mouth every morning.     . metoprolol succinate (TOPROL-XL) 50 MG 24 hr tablet Take 25 mg by mouth every morning. Take with or immediately following a meal.     . omeprazole (PRILOSEC) 20 MG capsule Take 20 mg by mouth daily before breakfast.     . ondansetron (ZOFRAN) 8 MG tablet Take 1 tablet (8 mg total) by mouth every 8 (eight) hours as needed for nausea or vomiting. 30 tablet 2  . Potassium Bicarb-Citric Acid (EFFER-K) 10 MEQ TBEF Take 1 tablet (10 mEq total) by mouth 2 (two) times daily. 60 each 0  . prochlorperazine (COMPAZINE) 10 MG tablet Take 1 tablet (10 mg total) by mouth every 6 (six) hours as needed for nausea or vomiting. 30 tablet 2  . XARELTO 20 MG TABS tablet Take 20 mg by mouth daily with supper.      No current facility-administered medications for this visit.    Facility-Administered Medications Ordered in Other Visits  Medication Dose Route Frequency Provider Last Rate Last Dose  . sodium chloride flush (NS) 0.9 % injection 10 mL  10 mL Intracatheter PRN Patrici Ranks, MD        Allergies  Allergen Reactions  . Oxaliplatin Anaphylaxis  . Pantoprazole Other (See Comments)    THIS GIVES THE PATIENT TERRIBLE HEARTBURN (PLEASE DO NOT GIVE)  . Adhesive [Tape] Rash  . Aspirin Other (See Comments)    Burns stomach  . Neulasta [Pegfilgrastim] Other (See Comments)    UNSURE   . Oxycodone Nausea And Vomiting  . Tramadol Nausea And Vomiting    Review of Systems   Intermittent episodes of right pleuritic chest pain which are not  prolonged Intermittent dry cough No fever Chest tube incision site well healed  BP 122/75   Pulse 75   Resp 20   Ht '5\' 4"'$  (1.626 m)   Wt 119 lb (54 kg)   SpO2 97% Comment: RA  BMI 20.43 kg/m  Physical Exam       Exam    General- alert and comfortable   Lungs- clear without rales, wheezes   Cor- regular rate and rhythm, no murmur , gallop   Abdomen- soft, non-tender   Extremities - warm, non-tender, minimal edema   Neuro- oriented, appropriate, no focal weakness  Diagnostic Tests: Chest x-ray without significant right pneumothorax after removal of chest tube but with severe COPD and bilateral pulmonary metastatic disease from pancreatic cancer  Impression: Patient doing well No significant residual pneumothorax Continue current activity level-patient advised against heavy lifting or straining Patient may begin resuming her chemotherapy cycles   Plan: Return with chest x-ray in 3 months or sooner if she develops symptoms or recurrent pneumothorax  Len Childs, MD [facility] [phone]

## 2016-04-25 ENCOUNTER — Encounter (HOSPITAL_COMMUNITY): Payer: Medicare Other | Attending: Adult Health | Admitting: Oncology

## 2016-04-25 ENCOUNTER — Encounter (HOSPITAL_COMMUNITY): Payer: Medicare Other

## 2016-04-25 ENCOUNTER — Encounter (HOSPITAL_COMMUNITY): Payer: Self-pay | Admitting: Oncology

## 2016-04-25 DIAGNOSIS — C259 Malignant neoplasm of pancreas, unspecified: Secondary | ICD-10-CM | POA: Diagnosis not present

## 2016-04-25 DIAGNOSIS — Z95828 Presence of other vascular implants and grafts: Secondary | ICD-10-CM | POA: Insufficient documentation

## 2016-04-25 DIAGNOSIS — C7800 Secondary malignant neoplasm of unspecified lung: Secondary | ICD-10-CM | POA: Diagnosis not present

## 2016-04-25 DIAGNOSIS — D6481 Anemia due to antineoplastic chemotherapy: Secondary | ICD-10-CM

## 2016-04-25 DIAGNOSIS — J939 Pneumothorax, unspecified: Secondary | ICD-10-CM

## 2016-04-25 DIAGNOSIS — R197 Diarrhea, unspecified: Secondary | ICD-10-CM | POA: Insufficient documentation

## 2016-04-25 LAB — COMPREHENSIVE METABOLIC PANEL
ALBUMIN: 3.6 g/dL (ref 3.5–5.0)
ALK PHOS: 92 U/L (ref 38–126)
ALT: 11 U/L — ABNORMAL LOW (ref 14–54)
ANION GAP: 9 (ref 5–15)
AST: 18 U/L (ref 15–41)
BILIRUBIN TOTAL: 0.4 mg/dL (ref 0.3–1.2)
BUN: 23 mg/dL — ABNORMAL HIGH (ref 6–20)
CALCIUM: 9.5 mg/dL (ref 8.9–10.3)
CO2: 19 mmol/L — ABNORMAL LOW (ref 22–32)
Chloride: 110 mmol/L (ref 101–111)
Creatinine, Ser: 1.17 mg/dL — ABNORMAL HIGH (ref 0.44–1.00)
GFR calc non Af Amer: 44 mL/min — ABNORMAL LOW (ref >60)
GFR, EST AFRICAN AMERICAN: 51 mL/min — AB (ref >60)
Glucose, Bld: 94 mg/dL (ref 65–99)
Potassium: 4.7 mmol/L (ref 3.5–5.1)
SODIUM: 138 mmol/L (ref 135–145)
TOTAL PROTEIN: 7.1 g/dL (ref 6.5–8.1)

## 2016-04-25 LAB — CBC WITH DIFFERENTIAL/PLATELET
BASOS PCT: 0 %
Basophils Absolute: 0 10*3/uL (ref 0.0–0.1)
EOS ABS: 0.1 10*3/uL (ref 0.0–0.7)
Eosinophils Relative: 2 %
HEMATOCRIT: 33.8 % — AB (ref 36.0–46.0)
HEMOGLOBIN: 10.9 g/dL — AB (ref 12.0–15.0)
LYMPHS ABS: 1.6 10*3/uL (ref 0.7–4.0)
Lymphocytes Relative: 18 %
MCH: 31.1 pg (ref 26.0–34.0)
MCHC: 32.2 g/dL (ref 30.0–36.0)
MCV: 96.3 fL (ref 78.0–100.0)
MONOS PCT: 7 %
Monocytes Absolute: 0.6 10*3/uL (ref 0.1–1.0)
NEUTROS PCT: 73 %
Neutro Abs: 6.5 10*3/uL (ref 1.7–7.7)
Platelets: 167 10*3/uL (ref 150–400)
RBC: 3.51 MIL/uL — ABNORMAL LOW (ref 3.87–5.11)
RDW: 14.5 % (ref 11.5–15.5)
WBC: 8.9 10*3/uL (ref 4.0–10.5)

## 2016-04-25 MED ORDER — PANCRELIPASE (LIP-PROT-AMYL) 36000-114000 UNITS PO CPEP: 72000.0000 [IU] | ORAL_CAPSULE | Freq: Three times a day (TID) | ORAL | 0 refills | Status: DC

## 2016-04-25 NOTE — Patient Instructions (Signed)
[name] at [facility] Discharge Instructions  RECOMMENDATIONS MADE BY THE CONSULTANT AND ANY TEST RESULTS WILL BE SENT TO YOUR REFERRING PHYSICIAN.  You were seen today by Kirby Crigler PA-C. Increase Creon to 2 capsules before each meal. Labs today, treatment tomorrow. CT in 2-3 weeks. Follow up in 1 month.  Thank you for choosing [place] at [facility] to provide your oncology and hematology care.  To afford each patient quality time with our provider, please arrive at least 15 minutes before your scheduled appointment time.    If you have a lab appointment with the [facility] please come in thru the  Main Entrance and check in at the main information desk  You need to re-schedule your appointment should you arrive 10 or more minutes late.  We strive to give you quality time with our providers, and arriving late affects you and other patients whose appointments are after yours.  Also, if you no show three or more times for appointments you may be dismissed from the clinic at the providers discretion.     Again, thank you for choosing [facility].  Our hope is that these requests will decrease the amount of time that you wait before being seen by our physicians.       _____________________________________________________________  Should you have questions after your visit to [facility] : [facility], please contact our office at [phone] between the hours of 8:30 a.m. and 4:30 p.m.  Voicemails left after 4:30 p.m. will not be returned until the following business day.  For prescription refill requests, have your pharmacy contact our office.       Resources For Cancer Patients and their Caregivers ? American Cancer Society: Can assist with transportation, wigs, general needs, runs Look Good Feel Better.        732-737-1617 ? Cancer Care: Provides financial assistance, online support groups, medication/co-pay  assistance.  1-800-813-HOPE (417) 138-1961) ? [place] Assists [place] cancer patients and their families through emotional , educational and financial support.  629-723-3038 ? [place] DSS Where to apply for food stamps, Medicaid and utility assistance. (838) 768-9600 ? [facility]: Transportation to medical appointments. (716) 702-0627 ? Social Security Administration: May apply for disability if have a Stage IV cancer. 818-796-5862 217-197-1823 ? [employer], Disability and Transit Services: Assists with nutrition, care and transit needs. [place] Support Programs: '@10RELATIVEDAYS'$ @ > Cancer Support Group  2nd Tuesday of the month 1pm-2pm, Journey Room  > Creative Journey  3rd Tuesday of the month 1130am-1pm, Journey Room  > Look Good Feel Better  1st Wednesday of the month 10am-12 noon, Journey Room (Call [place] to register [phone])

## 2016-04-25 NOTE — Assessment & Plan Note (Addendum)
Stage IV pancreatic carcinoma, she is also reported to have a history of stage IV BAC of the lungs. (On further review I feel she may have had stage I BAC of the lungs).  She is currently on  liposomal irinotecan/5-FU (dose reduced). AND Recurrent pneumothorax requiring chest tube placement, followed/managed by Dr. Prescott Gum, resulting in [place] chemotherapy from 03/01/2016- March 2018.  Oncology history is updated.  Treatment plan is updated.  Labs today: CBC diff, CMET, CA 19-9.  I personally reviewed and went over laboratory results with the patient.  The results are noted within this dictation.       Supportive therapy plan for Aranesp 500 mcg every 3 weeks due to chemotherapy-induced anemia is reviewed.   This can be used if needed moving forward.  She has a history of 5FU-induced labial sores that are self limiting.  5FU has been dose-reduced in the past and further dose-reduction is not indicated.  She has a recent history of shingles that has resolved following her left T4/T5 dermatome.  She notes post-prandial diarrhea.  She is taking Creon.  I have increased the dose of Creon to 72,000 units prior to meals.  Medication list is updated accordingly.  We continued to discuss goals of care and she was very frank that quality of life is most important to her.  She is educated that we must always weigh the risks and benefits of ongoing treatment and therefore, delays or stopping treatment are reasonable and will most likely be needed.  She is realistic and prepared for transition of care when indicated.  Orders are placed for restaging scans: CT CAP with contrast.  Return tomorrow for chemotherapy.  Return in 4 weeks for follow-up with treatment.

## 2016-04-25 NOTE — Progress Notes (Signed)
[facility], MD [place] [address]  Pancreatic cancer metastasized to lung [facility]) - Plan: CBC with Differential, Comprehensive metabolic panel, Cancer antigen 19-9, CBC with Differential, Comprehensive metabolic panel, CBC with Differential, Comprehensive metabolic panel, Cancer antigen 19-9, CT Abdomen Pelvis W Contrast, CT Chest W Contrast, lipase/protease/amylase (CREON) 36000 UNITS CPEP capsule  CURRENT THERAPY: Liposome Irinotecan/5FU/Leucovorin  INTERVAL HISTORY: GLENOLA WHEAT 76 y.o. female returns for followup of stage IV pancreatic carcinoma, she is also reported to have a history of stage IV BAC of the lungs. (On further review I feel she may have had stage I BAC of the lungs).  She is currently onliposomal irinotecan/5-FU.  AND Recurrent pneumothorax requiring chest tube placement, followed/managed by Dr. Prescott Gum, resulting in [place] chemotherapy from 03/01/2016- March 2018.    Pancreatic cancer metastasized to lung [facility])   09/03/2008 Surgery    Whipple with Dr. Birdie Sons for T3N1 3/12 LN+ Pancreatic Adenocarcinoma      11/13/2008 - 09/12/2009 Chemotherapy    Adjuvant Gemcitabine X 6 months      08/31/2009 Surgery    Metastectomy with Dr. Arlyce Dice RUL 2 nodules, RLL (wedge resection X 2)      08/31/2009 Pathology Results    Metastatic adenocarcinoma TTF-1 negative      06/03/2010 Surgery    Left video-assisted thoracoscopic surgery, resection of left lower lobe lesion. with Dr. Arlyce Dice      06/04/2010 Pathology Results    adenocarcinoma positive for cytokeratin 7, focally positive for CDX2, with scattered staining for TTF1 cytokeratin 20. [facility] opinion felt to be lung primary although noted to stain for GI markers, not felt to be pancreatic primary. EGFR and ALK negative      03/31/2011 - 09/04/2011 Radiation Therapy    SBRT LUL 18Gy Dr. Tammi Klippel      03/15/2012 - 06/13/2012 Chemotherapy    5-FU based chemotherapy      02/13/2013 -  05/20/2013 Chemotherapy    Gemzar Abraxane for metastatic disease (given to cover both primaries at [facility])      07/15/2014 PET scan    Mild progression of pulm mets, hypermet mesenteric tumor assoc with  SMA is stable to slightly increased in degree of FDG uptake      07/23/2014 Imaging         07/30/2014 - 09/10/2014 Chemotherapy    Gemzar abraxane stopped due to poor tolerance      04/06/2015 Procedure    L 20 French chest tube for spontaneous pneumothorax, by Dr. Tharon Aquas Trigt      04/20/2015 Pathology Results    High grade carcinoma c/w patient's known pancreatic primary (secondary opinion at [facility]) PDL-1 high expression, preserved major and minor MMR, MSI stable, [facility] ONE testing performed,       04/20/2015 Surgery    Left VATS, mini thoracotomy with stapling and oversewing of blebs, Talc pleurodesis. performed secondary to persistent air leak not improved with chest tube drainage      07/29/2015 Imaging    No signif change in appear of multifocal B cavitary pulmonary lesions, postop appearance upper abdomen, no findings of met disease to abd or pelvis,      09/29/2015 Imaging    Slight progression of multifocal cavitary pulmonary metastases, as discussed above. 2. Interval development of a lytic lesion in the antral lateral aspect of the left fifth rib where there is a nondisplaced pathologic fracture. No other definite osseous metastases are identified. 3. Prominent  soft tissue adjacent to the proximal superior mesenteric artery and in the aortocaval nodal station, similar to prior examinations, likely to represent treated metastatic lymphadenopathy. 4. Epigastric ventral hernia containing several short loops of small bowel. There is a focally dilated loop of small bowel adjacent to this, however, this is an isolated finding. No other dilatation of small bowel or colon to suggest frank bowel obstruction at this time.       10/19/2015 -  03/01/2016 Chemotherapy    The patient had palonosetron (ALOXI) injection 0.25 mg, 0.25 mg, Intravenous,  Once, 10 of 12 cycles  leucovorin 652 mg in dextrose 5 % 250 mL infusion, 400 mg/m2 = 652 mg, Intravenous,  Once, 10 of 12 cycles  fluorouracil (ADRUCIL) 2,950 mg in sodium chloride 0.9 % 91 mL chemo infusion, 1,800 mg/m2 = 2,950 mg (100 % of original dose 1,800 mg/m2), Intravenous, 1 Day/Dose, 10 of 12 cycles Dose modification: 1,920 mg/m2 (original dose 1,800 mg/m2, Cycle 1, Reason: Provider Judgment), 1,800 mg/m2 (original dose 1,800 mg/m2, Cycle 1, Reason: Provider Judgment), 1,500 mg/m2 (original dose 1,800 mg/m2, Cycle 2, Reason: Dose not tolerated)  irinotecan LIPOSOME (ONIVYDE) 55.9 mg in sodium chloride 0.9 % 500 mL chemo infusion, 35 mg/m2 = 55.9 mg (100 % of original dose 35 mg/m2), Intravenous, Once, 10 of 12 cycles Dose modification: 35 mg/m2 (original dose 35 mg/m2, Cycle 1, Reason: Provider Judgment)  for chemotherapy treatment.        01/12/2016 Imaging    CT CAP- 1. Cavitary lung metastases are stable to decreased in size. 2. Expansile mixed lytic and sclerotic bone metastasis in the anterior left fifth rib is increased in sclerosis and mildly increased in size. 3. Infiltrative soft tissue in the retroperitoneum abutting the SMA is stable. 4. No new sites of metastatic disease in the chest, abdomen or pelvis.      03/05/2016 Procedure    Chest tube placed for spontaneous pneumothorax by Dr. Cyndia Bent      03/05/2016 - 03/10/2016 Hospital Admission    Admit date: 03/05/2016 Admission diagnosis: Spontenous pneumothorax Additional comments: chest tube placed by Dr. Cyndia Bent on 03/05/2016      03/14/2016 - 03/18/2016 Hospital Admission    Admit date: 03/14/2016 Admission diagnosis: Recurrent spontaneous pneumothorax Additional comments:        Chemotherapy    The patient had palonosetron (ALOXI) injection 0.25 mg, 0.25 mg, Intravenous,  Once, 10 of 12 cycles  leucovorin  652 mg in dextrose 5 % 250 mL infusion, 400 mg/m2 = 652 mg, Intravenous,  Once, 10 of 12 cycles  fluorouracil (ADRUCIL) 2,950 mg in sodium chloride 0.9 % 91 mL chemo infusion, 1,800 mg/m2 = 2,950 mg (100 % of original dose 1,800 mg/m2), Intravenous, 1 Day/Dose, 10 of 12 cycles Dose modification: 1,920 mg/m2 (original dose 1,800 mg/m2, Cycle 1, Reason: Provider Judgment), 1,800 mg/m2 (original dose 1,800 mg/m2, Cycle 1, Reason: Provider Judgment), 1,500 mg/m2 (original dose 1,800 mg/m2, Cycle 2, Reason: Dose not tolerated)  irinotecan LIPOSOME (ONIVYDE) 55.9 mg in sodium chloride 0.9 % 500 mL chemo infusion, 35 mg/m2 = 55.9 mg (100 % of original dose 35 mg/m2), Intravenous, Once, 10 of 12 cycles Dose modification: 35 mg/m2 (original dose 35 mg/m2, Cycle 1, Reason: Provider Judgment)  for chemotherapy treatment.         Overall, the patient is doing well.  Her weight is stable over the past 2 months.  Her appetite is fair and she is using Ensure/boost to help supplement her diet.  She reports  postprandial diarrhea.  She is taking Creon and is compliant with this medication.  She is taking 36,000 units before each meal.  Next  Her breathing is back to baseline.  She notes mild discomfort with deep breathing.  She does have a recent history of shingles and this has nearly resolved following the T4/T5 dermatome on the left.  She notes that it is difficult but overall it has resolved.  She notes that it feels edematous.  She is interested in restarting chemotherapy.  She has been off of her chemotherapy secondary to spontaneous pneumothorax it has been managed by thoracic surgery.  Most recent imaging is stable.  She continues to follow-up with Dr. Prescott Gum.  Review of Systems  Constitutional: Negative.  Negative for chills, fever and weight loss.  HENT: Negative.   Eyes: Negative.   Respiratory: Negative.  Negative for cough.   Cardiovascular: Negative.  Negative for chest pain.    Gastrointestinal: Negative.  Negative for blood in stool, constipation, diarrhea, melena, nausea and vomiting.  Genitourinary: Negative.   Musculoskeletal: Negative.   Skin: Negative.   Neurological: Negative.  Negative for weakness.  Endo/Heme/Allergies: Negative.   Psychiatric/Behavioral: Negative.     Past Medical History:  Diagnosis Date  . Abdominal wall hernia   . Anemia   . Aortic aneurysm (HCC)    small aortic aneurysm  . Arthritis    "little in my right hand" (03/10/2016)  . Cervical cancer ([place]) 1963  . COPD (chronic obstructive pulmonary disease) ([place])   . Emphysema   . GERD (gastroesophageal reflux disease)   . Goals of care, counseling/discussion 02/02/2016  . Hypercholesterolemia   . Hypertension   . Pancreatic adenocarcinoma [facility]) July 2010  . Pancreatic cancer metastasized to lung [facility]) 09/04/2015    Past Surgical History:  Procedure Laterality Date  . BREAST CYST EXCISION Right 1988  . BUNIONECTOMY Bilateral 2007  . CARDIAC CATHETERIZATION    . CATARACT EXTRACTION W/ INTRAOCULAR LENS  IMPLANT, BILATERAL Bilateral 2009  . CHOLECYSTECTOMY  July 2010   done w/ Whipple procedure  . IR GENERIC HISTORICAL  03/15/2016   IR GUIDED DRAIN W CATHETER PLACEMENT 03/15/2016 Greggory Keen, MD [facility]-INTERV RAD  . SKIN CANCER EXCISION Left    nasal fold  . STAPLING OF BLEBS Left 04/20/2015   Procedure: STAPLING OF BLEBS;  Surgeon: Ivin Poot, MD;  Location: [place];  Service: Thoracic;  Laterality: Left;  . THYROID SURGERY     "had a growth taken off"  . TONSILLECTOMY  1943  . VAGINAL HYSTERECTOMY  1963   Cervical Cancer  . VIDEO ASSISTED THORACOSCOPY Left 04/20/2015   Procedure: VIDEO ASSISTED THORACOSCOPY;  Surgeon: Ivin Poot, MD;  Location: [place];  Service: Thoracic;  Laterality: Left;  . WHIPPLE PROCEDURE  July 2010   pancreatic adenocarcinoma    Family History  Problem Relation Age of Onset  . Pancreatic cancer Brother   . Colon cancer Sister 67     12/2006 dx surgery and chemotherapy  . Ovarian cancer Sister 65    Ovarian ca,  surgery and chemotherapy    Social History   Social History  . Marital status: Widowed    Spouse name: N/A  . Number of children: 0  . Years of education: N/A   Occupational History  .  Retired   Social History Main Topics  . Smoking status: Former Smoker    Packs/day: 1.00    Years: 30.00    Types: Cigarettes  Quit date: 02/29/1996  . Smokeless tobacco: Never Used  . Alcohol use No  . Drug use: No  . Sexual activity: No   Other Topics Concern  . None   Social History Narrative  . None     PHYSICAL EXAMINATION  ECOG PERFORMANCE STATUS: 1 - Symptomatic but completely ambulatory  Vitals:   04/25/16 1026  BP: (!) 146/64  Pulse: 65  Resp: 18    GENERAL:alert, no distress, comfortable, cooperative, smiling and accompanied by her friend. SKIN: skin color, texture, turgor are normal, no rashes or significant lesions HEAD: Normocephalic, No masses, lesions, tenderness or abnormalities EYES: normal, EOMI, Conjunctiva are pink and non-injected EARS: External ears normal OROPHARYNX:lips, buccal mucosa, and tongue normal and mucous membranes are moist  NECK: supple, no adenopathy, trachea midline LYMPH:  no palpable lymphadenopathy BREAST:not examined LUNGS: clear to auscultation  HEART: regular rate & rhythm, no murmurs and no gallops ABDOMEN:abdomen soft and normal bowel sounds BACK: Back symmetric, no curvature. EXTREMITIES:less then 2 second capillary refill, no joint deformities, effusion, or inflammation, no skin discoloration, no cyanosis  NEURO: alert & oriented x 3 with fluent speech, no focal motor/sensory deficits, gait normal   LABORATORY DATA: CBC    Component Value Date/Time   WBC 8.9 04/25/2016 0932   RBC 3.51 (L) 04/25/2016 0932   HGB 10.9 (L) 04/25/2016 0932   HCT 33.8 (L) 04/25/2016 0932   PLT 167 04/25/2016 0932   MCV 96.3 04/25/2016 0932   MCH 31.1 04/25/2016  0932   MCHC 32.2 04/25/2016 0932   RDW 14.5 04/25/2016 0932   LYMPHSABS 1.6 04/25/2016 0932   MONOABS 0.6 04/25/2016 0932   EOSABS 0.1 04/25/2016 0932   BASOSABS 0.0 04/25/2016 0932      Chemistry      Component Value Date/Time   NA 138 04/25/2016 0932   K 4.7 04/25/2016 0932   CL 110 04/25/2016 0932   CO2 19 (L) 04/25/2016 0932   BUN 23 (H) 04/25/2016 0932   CREATININE 1.17 (H) 04/25/2016 0932      Component Value Date/Time   CALCIUM 9.5 04/25/2016 0932   ALKPHOS 92 04/25/2016 0932   AST 18 04/25/2016 0932   ALT 11 (L) 04/25/2016 0932   BILITOT 0.4 04/25/2016 0932     Results for ASHAYA, RAFTERY (MRN [id number]) as of 04/25/2016 08:07  Ref. Range 03/01/2016 09:10  CA 19-9 Latest Ref Range: 0 - 35 U/mL 196 (H)     PENDING LABS:   RADIOGRAPHIC STUDIES:  Dg Chest 2 View  Result Date: 04/19/2016 CLINICAL DATA:  Dyspnea, cough, history of COPD and cervical cancer. History of VATS procedure 03/2015 EXAM: CHEST  2 VIEW COMPARISON:  04/07/2016 CXR FINDINGS: Stable cardiomediastinal silhouette with aortic atherosclerosis. No aneurysm. Port catheter tip is seen in the mid SVC. Right-sided chest tube has been removed. Slightly smaller right hydropneumothorax at the lateral costophrenic angle with tiny apical pneumothorax unchanged in appearance. There is emphysematous hyperinflation of the upper lobes with chain sutures noted in the right upper lobe. Parenchymal opacities at each lung base are stable. Blunting the left costophrenic angle is also stable consistent small left effusion. IMPRESSION: 1. No significant change in the appearance of the chest. The right lateral hydropneumothorax is stable to slightly smaller in appearance with tiny stable apical pneumothorax. 2. Stable small left effusion. Stable bibasilar airspace opacities and atelectasis at each lung base. Electronically Signed   By: Ashley Royalty M.D.   On: 04/19/2016 15:30   Dg Chest  2 View  Result Date: 04/07/2016 CLINICAL  DATA:  Recurrent spontaneous pneumothorax. Right-sided chest tube placed 03/15/2016. EXAM: CHEST  2 VIEW COMPARISON:  None. FINDINGS: Stable right basilar chest tube. Stable small right pleural effusion. Small right apical pneumothorax. Tiny right basilar hydropneumothorax which has significantly decreased compared 03/23/2016. Small left pleural effusion. Bilateral airspace opacities unchanged compared with multiple prior exams. No left pneumothorax. Stable cardiomediastinal silhouette. Right-sided Port-A-Cath in satisfactory position. No acute osseous abnormality. IMPRESSION: 1. Stable right basilar chest tube. Stable small right pleural effusion. Tiny right basilar hydropneumothorax and right apical pneumothorax unchanged compared 03/23/2016. 2. Small left pleural effusion. Electronically Signed   By: Kathreen Devoid   On: 04/07/2016 15:19   Dg Chest 2 View  Result Date: 03/30/2016 CLINICAL DATA:  Lung cancer, right-sided drain EXAM: CHEST  2 VIEW COMPARISON:  03/23/2016 FINDINGS: Stable right basilar chest tube. Stable small right pleural effusion. Tiny right basilar hydropneumothorax which has significantly decreased compared 03/23/2016. Small left pleural effusion. Bilateral airspace opacities unchanged compared with multiple prior exams. No left pneumothorax. Stable cardiomediastinal silhouette. Right-sided Port-A-Cath in satisfactory position. No acute osseous abnormality. IMPRESSION: 1. Stable right basilar chest tube. Stable small right pleural effusion. Tiny right basilar hydropneumothorax which has significantly decreased compared 03/23/2016. 2. Small left pleural effusion. Electronically Signed   By: Kathreen Devoid   On: 03/30/2016 10:14     PATHOLOGY:    ASSESSMENT AND PLAN:  Pancreatic cancer metastasized to lung (HCC) Stage IV pancreatic carcinoma, she is also reported to have a history of stage IV BAC of the lungs. (On further review I feel she may have had stage I BAC of the lungs).  She is  currently on  liposomal irinotecan/5-FU (dose reduced). AND Recurrent pneumothorax requiring chest tube placement, followed/managed by Dr. Prescott Gum, resulting in [place] chemotherapy from 03/01/2016- March 2018.  Oncology history is updated.  Treatment plan is updated.  Labs today: CBC diff, CMET, CA 19-9.  I personally reviewed and went over laboratory results with the patient.  The results are noted within this dictation.       Supportive therapy plan for Aranesp 500 mcg every 3 weeks due to chemotherapy-induced anemia is reviewed.   This can be used if needed moving forward.  She has a history of 5FU-induced labial sores that are self limiting.  5FU has been dose-reduced in the past and further dose-reduction is not indicated.  She has a recent history of shingles that has resolved following her left T4/T5 dermatome.  She notes post-prandial diarrhea.  She is taking Creon.  I have increased the dose of Creon to 72,000 units prior to meals.  Medication list is updated accordingly.  We continued to discuss goals of care and she was very frank that quality of life is most important to her.  She is educated that we must always weigh the risks and benefits of ongoing treatment and therefore, delays or stopping treatment are reasonable and will most likely be needed.  She is realistic and prepared for transition of care when indicated.  Orders are placed for restaging scans: CT CAP with contrast.  Return tomorrow for chemotherapy.  Return in 4 weeks for follow-up with treatment.   ORDERS PLACED FOR THIS ENCOUNTER: Orders Placed This Encounter  Procedures  . CT Abdomen Pelvis W Contrast  . CT Chest W Contrast  . CBC with Differential  . Comprehensive metabolic panel  . Cancer antigen 19-9  . CBC with Differential  . Comprehensive metabolic panel  .  CBC with Differential  . Comprehensive metabolic panel  . Cancer antigen 19-9    MEDICATIONS PRESCRIBED THIS ENCOUNTER: Meds ordered  this encounter  Medications  . lipase/protease/amylase (CREON) 36000 UNITS CPEP capsule    Sig: Take 2 capsules (72,000 Units total) by mouth 3 (three) times daily before meals.    Dispense:  540 capsule    Refill:  0    Order Specific Question:   Supervising Provider    Answer:   Brunetta Genera [[id number]]    THERAPY PLAN:  Pretreatment labs today and we will anticipate re-starting chemotherapy tomorrow based on laboratory work.  Chemotherapy was given in a palliative fashion.  Goals of care discussions are ongoing and she is very realistic/reasonable.  All questions were answered. The patient knows to call the clinic with any problems, questions or concerns. We can certainly see the patient much sooner if necessary.  Patient and plan discussed with Dr. Twana First and she is in agreement with the aforementioned.   This note is electronically signed by: Doy Mince 04/25/2016 12:59 PM

## 2016-04-26 ENCOUNTER — Encounter (HOSPITAL_COMMUNITY): Payer: Self-pay

## 2016-04-26 ENCOUNTER — Encounter (HOSPITAL_BASED_OUTPATIENT_CLINIC_OR_DEPARTMENT_OTHER): Payer: Medicare Other

## 2016-04-26 VITALS — BP 120/62 | HR 60 | Temp 97.8°F | Resp 18 | Wt 119.2 lb

## 2016-04-26 DIAGNOSIS — C259 Malignant neoplasm of pancreas, unspecified: Secondary | ICD-10-CM | POA: Diagnosis not present

## 2016-04-26 DIAGNOSIS — Z5111 Encounter for antineoplastic chemotherapy: Secondary | ICD-10-CM | POA: Diagnosis present

## 2016-04-26 DIAGNOSIS — C7800 Secondary malignant neoplasm of unspecified lung: Principal | ICD-10-CM

## 2016-04-26 DIAGNOSIS — C7802 Secondary malignant neoplasm of left lung: Secondary | ICD-10-CM | POA: Diagnosis not present

## 2016-04-26 LAB — CANCER ANTIGEN 19-9: CA 19 9: 526 U/mL — AB (ref 0–35)

## 2016-04-26 MED ORDER — PROCHLORPERAZINE MALEATE 10 MG PO TABS
ORAL_TABLET | ORAL | Status: AC
  Filled 2016-04-26: qty 1

## 2016-04-26 MED ORDER — LEUCOVORIN CALCIUM INJECTION 350 MG
400.0000 mg/m2 | Freq: Once | INTRAVENOUS | Status: AC
  Administered 2016-04-26: 652 mg via INTRAVENOUS
  Filled 2016-04-26: qty 17.6

## 2016-04-26 MED ORDER — PALONOSETRON HCL INJECTION 0.25 MG/5ML
INTRAVENOUS | Status: AC
  Filled 2016-04-26: qty 5

## 2016-04-26 MED ORDER — SODIUM CHLORIDE 0.9 % IV SOLN
35.0000 mg/m2 | Freq: Once | INTRAVENOUS | Status: AC
  Administered 2016-04-26: 55.9 mg via INTRAVENOUS
  Filled 2016-04-26: qty 13

## 2016-04-26 MED ORDER — SODIUM CHLORIDE 0.9% FLUSH
10.0000 mL | INTRAVENOUS | Status: DC | PRN
  Administered 2016-04-26: 10 mL
  Filled 2016-04-26: qty 10

## 2016-04-26 MED ORDER — SODIUM CHLORIDE 0.9 % IV SOLN
Freq: Once | INTRAVENOUS | Status: AC
  Administered 2016-04-26: 10:00:00 via INTRAVENOUS

## 2016-04-26 MED ORDER — PROCHLORPERAZINE MALEATE 10 MG PO TABS
10.0000 mg | ORAL_TABLET | Freq: Once | ORAL | Status: AC
  Administered 2016-04-26: 10 mg via ORAL

## 2016-04-26 MED ORDER — PALONOSETRON HCL INJECTION 0.25 MG/5ML
0.2500 mg | Freq: Once | INTRAVENOUS | Status: AC
  Administered 2016-04-26: 0.25 mg via INTRAVENOUS

## 2016-04-26 MED ORDER — HEPARIN SOD (PORK) LOCK FLUSH 100 UNIT/ML IV SOLN: 500.0000 [IU] | Freq: Once | INTRAVENOUS | Status: DC | PRN

## 2016-04-26 MED ORDER — SODIUM CHLORIDE 0.9 % IV SOLN
1500.0000 mg/m2 | INTRAVENOUS | Status: DC
  Administered 2016-04-26: 2450 mg via INTRAVENOUS
  Filled 2016-04-26: qty 49

## 2016-04-26 NOTE — Progress Notes (Signed)
[name] [name] tolerated chemo tx well without complaints or incident. Labs from yesterday reviewed prior to administering chemotherapy. Pt discharged with 5FU pump infusing without issues. VSS upon discharge. Pt discharged self ambulatory in satisfactory condition accompanied by her sister

## 2016-04-26 NOTE — Patient Instructions (Signed)
[facility] Discharge Instructions for Patients Receiving Chemotherapy   Beginning January 23rd 2017 lab work for the Cancer Center will be done in the  Main lab at [place] on 1st floor. If you have a lab appointment with the Cancer Center please come in thru the  Main Entrance and check in at the main information desk   Today you received the following chemotherapy agents Irinotecan Liposome,Leucovorin and 5FU. Follow-up as scheduled. Call clinic for any questions or concerns  To help prevent nausea and vomiting after your treatment, we encourage you to take your nausea medication   If you develop nausea and vomiting, or diarrhea that is not controlled by your medication, call the clinic.  The clinic phone number is [phone]. Office hours are Monday-Friday 8:30am-5:00pm.  BELOW ARE SYMPTOMS THAT SHOULD BE REPORTED IMMEDIATELY:  *FEVER GREATER THAN 101.0 F  *CHILLS WITH OR WITHOUT FEVER  NAUSEA AND VOMITING THAT IS NOT CONTROLLED WITH YOUR NAUSEA MEDICATION  *UNUSUAL SHORTNESS OF BREATH  *UNUSUAL BRUISING OR BLEEDING  TENDERNESS IN MOUTH AND THROAT WITH OR WITHOUT PRESENCE OF ULCERS  *URINARY PROBLEMS  *BOWEL PROBLEMS  UNUSUAL RASH Items with * indicate a potential emergency and should be followed up as soon as possible. If you have an emergency after office hours please contact your primary care physician or go to the nearest emergency department.  Please call the clinic during office hours if you have any questions or concerns.   You may also contact the Patient Navigator at [phone] should you have any questions or need assistance in obtaining follow up care.      Resources For Cancer Patients and their Caregivers ? American Cancer Society: Can assist with transportation, wigs, general needs, runs Look Good Feel Better.        1-888-227-6333 ? Cancer Care: Provides financial assistance, online support groups, medication/co-pay  assistance.  1-800-813-HOPE (4673) ? [facility] Assists [place] cancer patients and their families through emotional , educational and financial support.  336-427-4357 ? [place] DSS Where to apply for food stamps, Medicaid and utility assistance. 336-342-1394 ? [facility]: Transportation to medical appointments. 336-347-2287 ? Social Security Administration: May apply for disability if have a Stage IV cancer. 336-342-7796 1-800-772-1213 ? [place] Aging, Disability and Transit Services: Assists with nutrition, care and transit needs. 336-349-2343         

## 2016-04-28 ENCOUNTER — Encounter (HOSPITAL_COMMUNITY): Payer: Medicare Other | Attending: Oncology

## 2016-04-28 VITALS — BP 129/55 | HR 54 | Temp 98.0°F | Resp 16

## 2016-04-28 DIAGNOSIS — C7800 Secondary malignant neoplasm of unspecified lung: Principal | ICD-10-CM

## 2016-04-28 DIAGNOSIS — C7802 Secondary malignant neoplasm of left lung: Secondary | ICD-10-CM

## 2016-04-28 DIAGNOSIS — Z452 Encounter for adjustment and management of vascular access device: Secondary | ICD-10-CM

## 2016-04-28 DIAGNOSIS — C259 Malignant neoplasm of pancreas, unspecified: Secondary | ICD-10-CM

## 2016-04-28 MED ORDER — HEPARIN SOD (PORK) LOCK FLUSH 100 UNIT/ML IV SOLN
500.0000 [IU] | Freq: Once | INTRAVENOUS | Status: AC | PRN
  Administered 2016-04-28: 500 [IU]

## 2016-04-28 MED ORDER — HEPARIN SOD (PORK) LOCK FLUSH 100 UNIT/ML IV SOLN
INTRAVENOUS | Status: AC
  Filled 2016-04-28: qty 5

## 2016-04-28 MED ORDER — SODIUM CHLORIDE 0.9% FLUSH
10.0000 mL | INTRAVENOUS | Status: DC | PRN
  Administered 2016-04-28: 10 mL
  Filled 2016-04-28: qty 10

## 2016-04-28 NOTE — Progress Notes (Signed)
Pt in today for pump removal. Pt with no complaints. Port to right chest flushed and deaccessed. Pt tolerated well. Pt stable and discharged home ambulatory.

## 2016-05-09 ENCOUNTER — Ambulatory Visit (HOSPITAL_COMMUNITY)
Admission: RE | Admit: 2016-05-09 | Discharge: 2016-05-09 | Disposition: A | Discharge status: Home / Self Care | Payer: Medicare Other | Source: Ambulatory Visit | Attending: Oncology | Admitting: Oncology

## 2016-05-09 DIAGNOSIS — C259 Malignant neoplasm of pancreas, unspecified: Secondary | ICD-10-CM | POA: Insufficient documentation

## 2016-05-09 DIAGNOSIS — C7800 Secondary malignant neoplasm of unspecified lung: Secondary | ICD-10-CM | POA: Diagnosis not present

## 2016-05-09 DIAGNOSIS — C7951 Secondary malignant neoplasm of bone: Secondary | ICD-10-CM | POA: Diagnosis not present

## 2016-05-09 DIAGNOSIS — I714 Abdominal aortic aneurysm, without rupture: Secondary | ICD-10-CM | POA: Insufficient documentation

## 2016-05-09 DIAGNOSIS — C7801 Secondary malignant neoplasm of right lung: Secondary | ICD-10-CM | POA: Diagnosis not present

## 2016-05-09 DIAGNOSIS — Z90411 Acquired partial absence of pancreas: Secondary | ICD-10-CM | POA: Diagnosis not present

## 2016-05-09 DIAGNOSIS — R918 Other nonspecific abnormal finding of lung field: Secondary | ICD-10-CM | POA: Insufficient documentation

## 2016-05-09 DIAGNOSIS — K838 Other specified diseases of biliary tract: Secondary | ICD-10-CM | POA: Insufficient documentation

## 2016-05-09 MED ORDER — IOPAMIDOL (ISOVUE-300) INJECTION 61%
75.0000 mL | Freq: Once | INTRAVENOUS | Status: AC | PRN
  Administered 2016-05-09: 75 mL via INTRAVENOUS

## 2016-05-10 ENCOUNTER — Encounter (HOSPITAL_BASED_OUTPATIENT_CLINIC_OR_DEPARTMENT_OTHER): Payer: Medicare Other | Admitting: Oncology

## 2016-05-10 ENCOUNTER — Encounter (HOSPITAL_BASED_OUTPATIENT_CLINIC_OR_DEPARTMENT_OTHER): Payer: Medicare Other

## 2016-05-10 VITALS — BP 107/53 | HR 51 | Temp 97.5°F | Resp 16 | Wt 118.8 lb

## 2016-05-10 DIAGNOSIS — R970 Elevated carcinoembryonic antigen [CEA]: Secondary | ICD-10-CM

## 2016-05-10 DIAGNOSIS — Z5111 Encounter for antineoplastic chemotherapy: Secondary | ICD-10-CM

## 2016-05-10 DIAGNOSIS — C7800 Secondary malignant neoplasm of unspecified lung: Principal | ICD-10-CM

## 2016-05-10 DIAGNOSIS — C259 Malignant neoplasm of pancreas, unspecified: Secondary | ICD-10-CM

## 2016-05-10 DIAGNOSIS — Z95828 Presence of other vascular implants and grafts: Secondary | ICD-10-CM | POA: Diagnosis not present

## 2016-05-10 DIAGNOSIS — R197 Diarrhea, unspecified: Secondary | ICD-10-CM | POA: Diagnosis not present

## 2016-05-10 DIAGNOSIS — C7802 Secondary malignant neoplasm of left lung: Secondary | ICD-10-CM

## 2016-05-10 LAB — CBC WITH DIFFERENTIAL/PLATELET
Basophils Absolute: 0 10*3/uL (ref 0.0–0.1)
Basophils Relative: 0 %
EOS PCT: 1 %
Eosinophils Absolute: 0 10*3/uL (ref 0.0–0.7)
HEMATOCRIT: 29.5 % — AB (ref 36.0–46.0)
Hemoglobin: 9.7 g/dL — ABNORMAL LOW (ref 12.0–15.0)
LYMPHS ABS: 1.1 10*3/uL (ref 0.7–4.0)
LYMPHS PCT: 18 %
MCH: 31.3 pg (ref 26.0–34.0)
MCHC: 32.9 g/dL (ref 30.0–36.0)
MCV: 95.2 fL (ref 78.0–100.0)
MONO ABS: 0.7 10*3/uL (ref 0.1–1.0)
MONOS PCT: 12 %
Neutro Abs: 4.1 10*3/uL (ref 1.7–7.7)
Neutrophils Relative %: 69 %
PLATELETS: 149 10*3/uL — AB (ref 150–400)
RBC: 3.1 MIL/uL — ABNORMAL LOW (ref 3.87–5.11)
RDW: 15 % (ref 11.5–15.5)
WBC: 6 10*3/uL (ref 4.0–10.5)

## 2016-05-10 LAB — COMPREHENSIVE METABOLIC PANEL
ALBUMIN: 3.4 g/dL — AB (ref 3.5–5.0)
ALT: 10 U/L — AB (ref 14–54)
AST: 17 U/L (ref 15–41)
Alkaline Phosphatase: 92 U/L (ref 38–126)
Anion gap: 5 (ref 5–15)
BUN: 17 mg/dL (ref 6–20)
CHLORIDE: 114 mmol/L — AB (ref 101–111)
CO2: 18 mmol/L — ABNORMAL LOW (ref 22–32)
CREATININE: 1 mg/dL (ref 0.44–1.00)
Calcium: 8.9 mg/dL (ref 8.9–10.3)
GFR calc Af Amer: >60 mL/min (ref >60)
GFR, EST NON AFRICAN AMERICAN: 53 mL/min — AB (ref >60)
Glucose, Bld: 91 mg/dL (ref 65–99)
POTASSIUM: 4 mmol/L (ref 3.5–5.1)
Sodium: 137 mmol/L (ref 135–145)
Total Bilirubin: 0.5 mg/dL (ref 0.3–1.2)
Total Protein: 6.6 g/dL (ref 6.5–8.1)

## 2016-05-10 MED ORDER — SODIUM CHLORIDE 0.9 % IV SOLN
35.0000 mg/m2 | Freq: Once | INTRAVENOUS | Status: AC
  Administered 2016-05-10: 55.9 mg via INTRAVENOUS
  Filled 2016-05-10: qty 13

## 2016-05-10 MED ORDER — PALONOSETRON HCL INJECTION 0.25 MG/5ML
0.2500 mg | Freq: Once | INTRAVENOUS | Status: AC
  Administered 2016-05-10: 0.25 mg via INTRAVENOUS
  Filled 2016-05-10: qty 5

## 2016-05-10 MED ORDER — SODIUM CHLORIDE 0.9 % IV SOLN
1500.0000 mg/m2 | INTRAVENOUS | Status: DC
  Administered 2016-05-10: 2450 mg via INTRAVENOUS
  Filled 2016-05-10: qty 40

## 2016-05-10 MED ORDER — SODIUM CHLORIDE 0.9 % IV SOLN
Freq: Once | INTRAVENOUS | Status: AC
  Administered 2016-05-10: 09:00:00 via INTRAVENOUS

## 2016-05-10 MED ORDER — LEUCOVORIN CALCIUM INJECTION 350 MG
400.0000 mg/m2 | Freq: Once | INTRAVENOUS | Status: AC
  Administered 2016-05-10: 652 mg via INTRAVENOUS
  Filled 2016-05-10: qty 32.6

## 2016-05-10 MED ORDER — PROCHLORPERAZINE MALEATE 10 MG PO TABS
10.0000 mg | ORAL_TABLET | Freq: Once | ORAL | Status: AC
  Administered 2016-05-10: 10 mg via ORAL
  Filled 2016-05-10: qty 1

## 2016-05-10 NOTE — Progress Notes (Signed)
Patient is here for treatment.  I saw her as a work-in to review her imaging results.  I personally reviewed and went over radiographic studies with the patient.  The results are noted within this dictation.  CT demonstrates mild progression of disease.  She is advised that her progression of disease is expected given her extended time away from chemotherapy due to pneumothorax.    I personally reviewed and went over laboratory results with the patient.  The results are noted within this dictation. CA19-9 has increased as well.  CA 19-9 and CT imaging are our new baseline moving forward.  Robynn Pane, PA-C 05/10/2016 4:43 PM

## 2016-05-10 NOTE — Patient Instructions (Signed)
[name] Discharge Instructions for Patients Receiving Chemotherapy   Beginning January 23rd 2017 lab work for the [name] will be done in the  Main lab at [name] on 1st floor. If you have a lab appointment with the [name] please come in thru the  Main Entrance and check in at the main information desk   Today you received the following chemotherapy agents liposomal irinotecan, leucovorin and 51f pump.  To help prevent nausea and vomiting after your treatment, we encourage you to take your nausea medication as instructed.   If you develop nausea and vomiting, or diarrhea that is not controlled by your medication, call the clinic.  The clinic phone number is [phone] Office hours are Monday-Friday 8:30am-5:00pm.  BELOW ARE SYMPTOMS THAT SHOULD BE REPORTED IMMEDIATELY:  *FEVER GREATER THAN 101.0 F  *CHILLS WITH OR WITHOUT FEVER  NAUSEA AND VOMITING THAT IS NOT CONTROLLED WITH YOUR NAUSEA MEDICATION  *UNUSUAL SHORTNESS OF BREATH  *UNUSUAL BRUISING OR BLEEDING  TENDERNESS IN MOUTH AND THROAT WITH OR WITHOUT PRESENCE OF ULCERS  *URINARY PROBLEMS  *BOWEL PROBLEMS  UNUSUAL RASH Items with * indicate a potential emergency and should be followed up as soon as possible. If you have an emergency after office hours please contact your primary care physician or go to the nearest emergency department.  Please call the clinic during office hours if you have any questions or concerns.   You may also contact the Patient Navigator at [phone] you have any questions or need assistance in obtaining follow up care.      Resources For Cancer Patients and their Caregivers ? American Cancer Society: Can assist with transportation, wigs, general needs, runs Look Good Feel Better.        1343 722 5878? Cancer Care: Provides financial assistance, online support groups, medication/co-pay assistance.  1-800-813-HOPE (778-319-7060 ? [facility] [place] cancer patients and their families through emotional , educational and financial support.  3518-270-4626? [place] DSS Where to apply for food stamps, Medicaid and utility assistance. 3380-383-6106? [employer]: Transportation to medical appointments. 3770-829-6380? Social Security Administration: May apply for disability if have a Stage IV cancer. [phone]? [employer]: Assists with nutrition, care and transit needs. [phone]

## 2016-05-10 NOTE — Progress Notes (Signed)
Tolerated chemo well. Continuous infusion pump intact. Patient stable and ambulatory on discharge home to self.

## 2016-05-12 ENCOUNTER — Encounter (HOSPITAL_COMMUNITY): Payer: Self-pay

## 2016-05-12 ENCOUNTER — Encounter (HOSPITAL_BASED_OUTPATIENT_CLINIC_OR_DEPARTMENT_OTHER): Payer: Medicare Other

## 2016-05-12 VITALS — BP 125/63 | HR 60 | Temp 97.3°F | Resp 16

## 2016-05-12 DIAGNOSIS — C259 Malignant neoplasm of pancreas, unspecified: Secondary | ICD-10-CM

## 2016-05-12 DIAGNOSIS — C7800 Secondary malignant neoplasm of unspecified lung: Secondary | ICD-10-CM

## 2016-05-12 DIAGNOSIS — E538 Deficiency of other specified B group vitamins: Secondary | ICD-10-CM

## 2016-05-12 DIAGNOSIS — D6481 Anemia due to antineoplastic chemotherapy: Secondary | ICD-10-CM | POA: Diagnosis present

## 2016-05-12 MED ORDER — HEPARIN SOD (PORK) LOCK FLUSH 100 UNIT/ML IV SOLN
INTRAVENOUS | Status: AC
  Filled 2016-05-12: qty 5

## 2016-05-12 MED ORDER — HEPARIN SOD (PORK) LOCK FLUSH 100 UNIT/ML IV SOLN
500.0000 [IU] | Freq: Once | INTRAVENOUS | Status: AC | PRN
  Administered 2016-05-12: 500 [IU]

## 2016-05-12 MED ORDER — DARBEPOETIN ALFA 500 MCG/ML IJ SOSY
PREFILLED_SYRINGE | INTRAMUSCULAR | Status: AC
  Filled 2016-05-12: qty 1

## 2016-05-12 MED ORDER — DARBEPOETIN ALFA 500 MCG/ML IJ SOSY
500.0000 ug | PREFILLED_SYRINGE | Freq: Once | INTRAMUSCULAR | Status: AC
  Administered 2016-05-12: 500 ug via SUBCUTANEOUS

## 2016-05-12 MED ORDER — SODIUM CHLORIDE 0.9% FLUSH
10.0000 mL | INTRAVENOUS | Status: DC | PRN
  Administered 2016-05-12: 10 mL
  Filled 2016-05-12: qty 10

## 2016-05-12 NOTE — Patient Instructions (Signed)
[place] at [facility] Discharge Instructions  RECOMMENDATIONS MADE BY THE CONSULTANT AND ANY TEST RESULTS WILL BE SENT TO YOUR REFERRING PHYSICIAN.  Pump removal with port flush. Aranesp today.    Thank you for choosing [facility] at [facility] to provide your oncology and hematology care.  To afford each patient quality time with our provider, please arrive at least 15 minutes before your scheduled appointment time.    If you have a lab appointment with the [facility] please come in thru the  Main Entrance and check in at the main information desk  You need to re-schedule your appointment should you arrive 10 or more minutes late.  We strive to give you quality time with our providers, and arriving late affects you and other patients whose appointments are after yours.  Also, if you no show three or more times for appointments you may be dismissed from the clinic at the providers discretion.     Again, thank you for choosing [facility].  Our hope is that these requests will decrease the amount of time that you wait before being seen by our physicians.       _____________________________________________________________  Should you have questions after your visit to [facility], please contact our office at [phone] between the hours of 8:30 a.m. and 4:30 p.m.  Voicemails left after 4:30 p.m. will not be returned until the following business day.  For prescription refill requests, have your pharmacy contact our office.       Resources For Cancer Patients and their Caregivers ? American Cancer Society: Can assist with transportation, wigs, general needs, runs Look Good Feel Better.        305-085-1412 ? Cancer Care: Provides financial assistance, online support groups, medication/co-pay assistance.  1-800-813-HOPE (276)742-1537) ? [place] Assists [place] cancer patients and  their families through emotional , educational and financial support.  415-087-3891 ? [place] DSS Where to apply for food stamps, Medicaid and utility assistance. 574-275-6342 ? [employer]: Transportation to medical appointments. 763-366-6790 ? Social Security Administration: May apply for disability if have a Stage IV cancer. 519-093-7615 813-133-3191 ? [employer], Disability and Transit Services: Assists with nutrition, care and transit needs. [place] Support Programs: '@10RELATIVEDAYS'$ @ > Cancer Support Group  2nd Tuesday of the month 1pm-2pm, Journey Room  > Creative Journey  3rd Tuesday of the month 1130am-1pm, Journey Room  > Look Good Feel Better  1st Wednesday of the month 10am-12 noon, Journey Room (Call [place] to register [phone])

## 2016-05-12 NOTE — Progress Notes (Signed)
Chemotherapy home infusion pump completed without difficulty.  Pump removed and port flushed per protocol, blood return noted.  Port then de-accessed with needle intact and no active bleeding at insertion site.  Aranesp 561mg given SQ in the right lower abdomen.  Patient tolerated all well.  VSS.  Patient ambulatory and stable upon discharge from clinic.

## 2016-05-24 ENCOUNTER — Encounter (HOSPITAL_BASED_OUTPATIENT_CLINIC_OR_DEPARTMENT_OTHER): Payer: Medicare Other

## 2016-05-24 ENCOUNTER — Encounter (HOSPITAL_COMMUNITY): Payer: Medicare Other | Attending: Adult Health | Admitting: Oncology

## 2016-05-24 ENCOUNTER — Encounter (HOSPITAL_COMMUNITY): Payer: Self-pay

## 2016-05-24 ENCOUNTER — Other Ambulatory Visit (HOSPITAL_COMMUNITY): Payer: Self-pay | Admitting: Oncology

## 2016-05-24 VITALS — BP 113/40 | HR 52 | Temp 97.8°F | Resp 16

## 2016-05-24 VITALS — BP 144/81 | HR 55 | Temp 97.5°F | Resp 16 | Wt 121.4 lb

## 2016-05-24 DIAGNOSIS — C7802 Secondary malignant neoplasm of left lung: Secondary | ICD-10-CM

## 2016-05-24 DIAGNOSIS — Z5111 Encounter for antineoplastic chemotherapy: Secondary | ICD-10-CM | POA: Diagnosis present

## 2016-05-24 DIAGNOSIS — D649 Anemia, unspecified: Secondary | ICD-10-CM | POA: Diagnosis not present

## 2016-05-24 DIAGNOSIS — Z95828 Presence of other vascular implants and grafts: Secondary | ICD-10-CM | POA: Insufficient documentation

## 2016-05-24 DIAGNOSIS — C7800 Secondary malignant neoplasm of unspecified lung: Secondary | ICD-10-CM | POA: Insufficient documentation

## 2016-05-24 DIAGNOSIS — R197 Diarrhea, unspecified: Secondary | ICD-10-CM | POA: Insufficient documentation

## 2016-05-24 DIAGNOSIS — E538 Deficiency of other specified B group vitamins: Secondary | ICD-10-CM

## 2016-05-24 DIAGNOSIS — C259 Malignant neoplasm of pancreas, unspecified: Secondary | ICD-10-CM

## 2016-05-24 DIAGNOSIS — E876 Hypokalemia: Secondary | ICD-10-CM

## 2016-05-24 LAB — COMPREHENSIVE METABOLIC PANEL
ALK PHOS: 108 U/L (ref 38–126)
ALT: 10 U/L — ABNORMAL LOW (ref 14–54)
ANION GAP: 7 (ref 5–15)
AST: 17 U/L (ref 15–41)
Albumin: 3.4 g/dL — ABNORMAL LOW (ref 3.5–5.0)
BUN: 12 mg/dL (ref 6–20)
CALCIUM: 8.6 mg/dL — AB (ref 8.9–10.3)
CHLORIDE: 115 mmol/L — AB (ref 101–111)
CO2: 18 mmol/L — AB (ref 22–32)
Creatinine, Ser: 1.01 mg/dL — ABNORMAL HIGH (ref 0.44–1.00)
GFR calc non Af Amer: 52 mL/min — ABNORMAL LOW (ref >60)
Glucose, Bld: 99 mg/dL (ref 65–99)
Potassium: 3.3 mmol/L — ABNORMAL LOW (ref 3.5–5.1)
SODIUM: 140 mmol/L (ref 135–145)
Total Bilirubin: 0.6 mg/dL (ref 0.3–1.2)
Total Protein: 6.3 g/dL — ABNORMAL LOW (ref 6.5–8.1)

## 2016-05-24 LAB — CBC WITH DIFFERENTIAL/PLATELET
Basophils Absolute: 0 10*3/uL (ref 0.0–0.1)
Basophils Relative: 0 %
EOS ABS: 0 10*3/uL (ref 0.0–0.7)
EOS PCT: 1 %
HCT: 32.6 % — ABNORMAL LOW (ref 36.0–46.0)
Hemoglobin: 10.3 g/dL — ABNORMAL LOW (ref 12.0–15.0)
LYMPHS ABS: 0.8 10*3/uL (ref 0.7–4.0)
Lymphocytes Relative: 18 %
MCH: 30.7 pg (ref 26.0–34.0)
MCHC: 31.6 g/dL (ref 30.0–36.0)
MCV: 97.3 fL (ref 78.0–100.0)
MONOS PCT: 12 %
Monocytes Absolute: 0.5 10*3/uL (ref 0.1–1.0)
Neutro Abs: 3.1 10*3/uL (ref 1.7–7.7)
Neutrophils Relative %: 69 %
PLATELETS: 136 10*3/uL — AB (ref 150–400)
RBC: 3.35 MIL/uL — ABNORMAL LOW (ref 3.87–5.11)
RDW: 17.8 % — ABNORMAL HIGH (ref 11.5–15.5)
WBC: 4.5 10*3/uL (ref 4.0–10.5)

## 2016-05-24 MED ORDER — PALONOSETRON HCL INJECTION 0.25 MG/5ML
0.2500 mg | Freq: Once | INTRAVENOUS | Status: AC
  Administered 2016-05-24: 0.25 mg via INTRAVENOUS
  Filled 2016-05-24: qty 5

## 2016-05-24 MED ORDER — LEUCOVORIN CALCIUM INJECTION 350 MG
400.0000 mg/m2 | Freq: Once | INTRAVENOUS | Status: AC
  Administered 2016-05-24: 652 mg via INTRAVENOUS
  Filled 2016-05-24: qty 32.6

## 2016-05-24 MED ORDER — SODIUM CHLORIDE 0.9 % IV SOLN
35.0000 mg/m2 | Freq: Once | INTRAVENOUS | Status: AC
  Administered 2016-05-24: 55.9 mg via INTRAVENOUS
  Filled 2016-05-24: qty 13

## 2016-05-24 MED ORDER — POTASSIUM CHLORIDE CRYS ER 20 MEQ PO TBCR
60.0000 meq | EXTENDED_RELEASE_TABLET | Freq: Once | ORAL | Status: AC
  Administered 2016-05-24: 60 meq via ORAL
  Filled 2016-05-24: qty 3

## 2016-05-24 MED ORDER — SODIUM CHLORIDE 0.9 % IV SOLN
Freq: Once | INTRAVENOUS | Status: AC
  Administered 2016-05-24: 11:00:00 via INTRAVENOUS

## 2016-05-24 MED ORDER — SODIUM CHLORIDE 0.9 % IV SOLN
1500.0000 mg/m2 | INTRAVENOUS | Status: DC
  Administered 2016-05-24: 2450 mg via INTRAVENOUS
  Filled 2016-05-24: qty 40

## 2016-05-24 MED ORDER — PROCHLORPERAZINE MALEATE 10 MG PO TABS
10.0000 mg | ORAL_TABLET | Freq: Once | ORAL | Status: AC
  Administered 2016-05-24: 10 mg via ORAL
  Filled 2016-05-24: qty 1

## 2016-05-24 NOTE — Progress Notes (Signed)
[place]  Progress Note  Patient Care Team: Monico Blitz, MD as PCP - General  CHIEF COMPLAINTS/PURPOSE OF CONSULTATION:    Pancreatic cancer metastasized to lung [facility])   09/03/2008 Surgery    Whipple with Dr. Birdie Sons for T3N1 3/12 LN+ Pancreatic Adenocarcinoma      11/13/2008 - 09/12/2009 Chemotherapy    Adjuvant Gemcitabine X 6 months      08/31/2009 Surgery    Metastectomy with Dr. Arlyce Dice RUL 2 nodules, RLL (wedge resection X 2)      08/31/2009 Pathology Results    Metastatic adenocarcinoma TTF-1 negative      06/03/2010 Surgery    Left video-assisted thoracoscopic surgery, resection of left lower lobe lesion. with Dr. Arlyce Dice      06/04/2010 Pathology Results    adenocarcinoma positive for cytokeratin 7, focally positive for CDX2, with scattered staining for TTF1 cytokeratin 20. [facility] opinion felt to be lung primary although noted to stain for GI markers, not felt to be pancreatic primary. EGFR and ALK negative      03/31/2011 - 09/04/2011 Radiation Therapy    SBRT LUL 18Gy Dr. Tammi Klippel      03/15/2012 - 06/13/2012 Chemotherapy    5-FU based chemotherapy      02/13/2013 - 05/20/2013 Chemotherapy    Gemzar Abraxane for metastatic disease (given to cover both primaries at [facility])      07/15/2014 PET scan    Mild progression of pulm mets, hypermet mesenteric tumor assoc with  SMA is stable to slightly increased in degree of FDG uptake      07/23/2014 Imaging         07/30/2014 - 09/10/2014 Chemotherapy    Gemzar abraxane stopped due to poor tolerance      04/06/2015 Procedure    L 20 French chest tube for spontaneous pneumothorax, by Dr. Tharon Aquas Trigt      04/20/2015 Pathology Results    High grade carcinoma c/w patient's known pancreatic primary (secondary opinion at [facility]) PDL-1 high expression, preserved major and minor MMR, MSI stable, Foundation ONE testing performed,       04/20/2015 Surgery    Left VATS, mini  thoracotomy with stapling and oversewing of blebs, Talc pleurodesis. performed secondary to persistent air leak not improved with chest tube drainage      07/29/2015 Imaging    No signif change in appear of multifocal B cavitary pulmonary lesions, postop appearance upper abdomen, no findings of met disease to abd or pelvis,      09/29/2015 Imaging    Slight progression of multifocal cavitary pulmonary metastases, as discussed above. 2. Interval development of a lytic lesion in the antral lateral aspect of the left fifth rib where there is a nondisplaced pathologic fracture. No other definite osseous metastases are identified. 3. Prominent soft tissue adjacent to the proximal superior mesenteric artery and in the aortocaval nodal station, similar to prior examinations, likely to represent treated metastatic lymphadenopathy. 4. Epigastric ventral hernia containing several short loops of small bowel. There is a focally dilated loop of small bowel adjacent to this, however, this is an isolated finding. No other dilatation of small bowel or colon to suggest frank bowel obstruction at this time.       10/19/2015 - 03/01/2016 Chemotherapy    The patient had palonosetron (ALOXI) injection 0.25 mg, 0.25 mg, Intravenous,  Once, 10 of 12 cycles  leucovorin 652 mg in dextrose 5 % 250 mL infusion, 400 mg/m2 = 652 mg, Intravenous,  Once, 10 of 12 cycles  fluorouracil (ADRUCIL) 2,950 mg in sodium chloride 0.9 % 91 mL chemo infusion, 1,800 mg/m2 = 2,950 mg (100 % of original dose 1,800 mg/m2), Intravenous, 1 Day/Dose, 10 of 12 cycles Dose modification: 1,920 mg/m2 (original dose 1,800 mg/m2, Cycle 1, Reason: Provider Judgment), 1,800 mg/m2 (original dose 1,800 mg/m2, Cycle 1, Reason: Provider Judgment), 1,500 mg/m2 (original dose 1,800 mg/m2, Cycle 2, Reason: Dose not tolerated)  irinotecan LIPOSOME (ONIVYDE) 55.9 mg in sodium chloride 0.9 % 500 mL chemo infusion, 35 mg/m2 = 55.9 mg (100 % of original  dose 35 mg/m2), Intravenous, Once, 10 of 12 cycles Dose modification: 35 mg/m2 (original dose 35 mg/m2, Cycle 1, Reason: Provider Judgment)  for chemotherapy treatment.        01/12/2016 Imaging    CT CAP- 1. Cavitary lung metastases are stable to decreased in size. 2. Expansile mixed lytic and sclerotic bone metastasis in the anterior left fifth rib is increased in sclerosis and mildly increased in size. 3. Infiltrative soft tissue in the retroperitoneum abutting the SMA is stable. 4. No new sites of metastatic disease in the chest, abdomen or pelvis.      03/05/2016 Procedure    Chest tube placed for spontaneous pneumothorax by Dr. Cyndia Bent      03/05/2016 - 03/10/2016 Hospital Admission    Admit date: 03/05/2016 Admission diagnosis: Spontenous pneumothorax Additional comments: chest tube placed by Dr. Cyndia Bent on 03/05/2016      03/14/2016 - 03/18/2016 Hospital Admission    Admit date: 03/14/2016 Admission diagnosis: Recurrent spontaneous pneumothorax Additional comments:       04/26/2016 -  Chemotherapy    The patient had palonosetron (ALOXI) injection 0.25 mg, 0.25 mg, Intravenous,  Once, 10 of 12 cycles  leucovorin 652 mg in dextrose 5 % 250 mL infusion, 400 mg/m2 = 652 mg, Intravenous,  Once, 10 of 12 cycles  fluorouracil (ADRUCIL) 2,950 mg in sodium chloride 0.9 % 91 mL chemo infusion, 1,800 mg/m2 = 2,950 mg (100 % of original dose 1,800 mg/m2), Intravenous, 1 Day/Dose, 10 of 12 cycles Dose modification: 1,920 mg/m2 (original dose 1,800 mg/m2, Cycle 1, Reason: Provider Judgment), 1,800 mg/m2 (original dose 1,800 mg/m2, Cycle 1, Reason: Provider Judgment), 1,500 mg/m2 (original dose 1,800 mg/m2, Cycle 2, Reason: Dose not tolerated)  irinotecan LIPOSOME (ONIVYDE) 55.9 mg in sodium chloride 0.9 % 500 mL chemo infusion, 35 mg/m2 = 55.9 mg (100 % of original dose 35 mg/m2), Intravenous, Once, 10 of 12 cycles Dose modification: 35 mg/m2 (original dose 35 mg/m2, Cycle 1, Reason: Provider  Judgment)  for chemotherapy treatment.        05/09/2016 Imaging    CT CAP- CT CHEST IMPRESSION  1. Mild progression of pulmonary metastasis. Although the majority of cavitary lung lesions are primarily similar, there are new and increased small cavitary nodules. 2. Trace residual loculated inferolateral right pleural air. 3. No thoracic adenopathy. 4. Progression of anterior left fifth rib metastasis with soft tissue component surrounding.  CT ABDOMEN AND PELVIS IMPRESSION  1. Status post Whipple procedure, without locally recurrent disease. 2. Similar soft tissue fullness within the retroperitoneum. 3. Pneumobilia. Possible mild cirrhosis. 4. Similar small bowel containing ventral abdominal wall laxity. 5. Similar abdominal aortic aneurysm.      05/09/2016 Progression    Progression of disease after being off therapy x 2 months.       HISTORY OF PRESENTING ILLNESS:  [name] [name] 76 y.o. female is here for a follow up of stage IV pancreatic carcinoma.  She has been doing well. Every time she eats, within 15 minutes she has diarrhea. The Imodium hasn't been helping, but she has only been taking it occasionally. Pt states that she has lost some weight. She has an occasional dull headache in the morning, but it goes away. She has been staying active. She has an abdominal hernia, but denies abdominal pain. Denies any other concerns.   MEDICAL HISTORY:  Past Medical History:  Diagnosis Date  . Abdominal wall hernia   . Anemia   . Aortic aneurysm (HCC)    small aortic aneurysm  . Arthritis    "little in my right hand" (03/10/2016)  . Cervical cancer ([place]) 1963  . COPD (chronic obstructive pulmonary disease) ([place])   . Emphysema   . GERD (gastroesophageal reflux disease)   . Goals of care, counseling/discussion 02/02/2016  . Hypercholesterolemia   . Hypertension   . Pancreatic adenocarcinoma [facility]) July 2010  . Pancreatic cancer metastasized to lung [facility]) 09/04/2015     SURGICAL HISTORY: Past Surgical History:  Procedure Laterality Date  . BREAST CYST EXCISION Right 1988  . BUNIONECTOMY Bilateral 05-13-2005  . CARDIAC CATHETERIZATION    . CATARACT EXTRACTION W/ INTRAOCULAR LENS  IMPLANT, BILATERAL Bilateral 2007/05/14  . CHOLECYSTECTOMY  July 2010   done w/ Whipple procedure  . IR GENERIC HISTORICAL  03/15/2016   IR GUIDED DRAIN W CATHETER PLACEMENT 03/15/2016 Greggory Keen, MD [facility]-INTERV RAD  . SKIN CANCER EXCISION Left    nasal fold  . STAPLING OF BLEBS Left 04/20/2015   Procedure: STAPLING OF BLEBS;  Surgeon: Ivin Poot, MD;  Location: [place];  Service: Thoracic;  Laterality: Left;  . THYROID SURGERY     "had a growth taken off"  . TONSILLECTOMY  May 13, 1941  . VAGINAL HYSTERECTOMY  1963   Cervical Cancer  . VIDEO ASSISTED THORACOSCOPY Left 04/20/2015   Procedure: VIDEO ASSISTED THORACOSCOPY;  Surgeon: Ivin Poot, MD;  Location: [place];  Service: Thoracic;  Laterality: Left;  . WHIPPLE PROCEDURE  July 2010   pancreatic adenocarcinoma    SOCIAL HISTORY: Social History   Social History  . Marital status: Widowed    Spouse name: N/A  . Number of children: 0  . Years of education: N/A   Occupational History  .  Retired   Social History Main Topics  . Smoking status: Former Smoker    Packs/day: 1.00    Years: 30.00    Types: Cigarettes    Quit date: 02/29/1996  . Smokeless tobacco: Never Used  . Alcohol use No  . Drug use: No  . Sexual activity: No   Other Topics Concern  . Not on file   Social History Narrative  . No narrative on file   Widowed; 5 years ago in December. Married 32 years, no children. Had cervical cancer when she was 21 Pancreatic cancer in July of 2010. Worked at [employer] for 36 years; also worked i Solicitor, at [employer], Social research officer, government. Smoked, but quit 15-20 years ago.  Enjoys quilting, crocheting, knitting. Has nieces and nephews.  Was born in [place].  FAMILY HISTORY: Family History  Problem Relation Age of Onset  .  Pancreatic cancer Brother   . Colon cancer Sister 20    12/2006 dx surgery and chemotherapy  . Ovarian cancer Sister 11    Ovarian ca,  surgery and chemotherapy   One older sister and one younger sister still living. Four brothers deceased. Daddy died of emphysema back in May 13, 1973 at the age of  66-68 Mother was 85 when she died in 04/15/1987. Had 4 brothers, died from 04-14-98 to 04/14/2006 Heart trouble, emphysema & dialysis ("he just had everything"). One brother had lung disease. One brother had pancreatic cancer and lived less than 2 months. [name] has had ovarian and colon cancer. Nephew that died last year with brain cancer. Niece with cancer being treated here.  ALLERGIES:  is allergic to oxaliplatin; pantoprazole; adhesive [tape]; aspirin; neulasta [pegfilgrastim]; oxycodone; and tramadol.  MEDICATIONS:  Current Outpatient Prescriptions  Medication Sig Dispense Refill  . acetaminophen (TYLENOL) 325 MG tablet Take 2 tablets (650 mg total) by mouth every 6 (six) hours as needed for mild pain (or Fever >/= 101).    Marland Kitchen acyclovir (ZOVIRAX) 800 MG tablet 800 mg 3 (three) times daily.     Marland Kitchen amoxicillin (AMOXIL) 500 MG capsule Take 2,000 mg by mouth See admin instructions. One hour prior to dental appt (as a pre-treatment)    . feeding supplement, ENSURE ENLIVE, (ENSURE ENLIVE) LIQD Take 237 mLs by mouth 2 (two) times daily between meals. 641 mL 12  . folic acid (FOLVITE) 1 MG tablet Take 1 tablet (1 mg total) by mouth daily. For one month then stop.    . hydrochlorothiazide (HYDRODIURIL) 25 MG tablet Take 12.5 mg by mouth every morning.     Marland Kitchen ibuprofen (ADVIL,MOTRIN) 400 MG tablet Take 1 tablet (400 mg total) by mouth every 6 (six) hours as needed for mild pain. 30 tablet 0  . lidocaine-prilocaine (EMLA) cream Apply a quarter size amount to port site 1 hour prior to chemo. Do not rub in. Cover with plastic wrap. 30 g 3  . lipase/protease/amylase (CREON) 36000 UNITS CPEP capsule Take 2 capsules (72,000 Units  total) by mouth 3 (three) times daily before meals. 540 capsule 0  . loperamide (IMODIUM A-D) 2 MG tablet At the onset of diarrhea, take 2 tabs. Then take 1 tab every 2 hours until you have gone 12 hours without having a loose stool. If it is bedtime and you have a loose stool(s) take 2 tabs every 4 hours until morning. Call [facility]. 60 tablet 1  . losartan (COZAAR) 100 MG tablet Take 100 mg by mouth every morning.     . metoprolol succinate (TOPROL-XL) 50 MG 24 hr tablet Take 25 mg by mouth every morning. Take with or immediately following a meal.     . omeprazole (PRILOSEC) 20 MG capsule Take 20 mg by mouth daily before breakfast.     . ondansetron (ZOFRAN) 8 MG tablet Take 1 tablet (8 mg total) by mouth every 8 (eight) hours as needed for nausea or vomiting. 30 tablet 2  . Potassium Bicarb-Citric Acid (EFFER-K) 10 MEQ TBEF Take 1 tablet (10 mEq total) by mouth 2 (two) times daily. 60 each 0  . prochlorperazine (COMPAZINE) 10 MG tablet Take 1 tablet (10 mg total) by mouth every 6 (six) hours as needed for nausea or vomiting. 30 tablet 2  . XARELTO 20 MG TABS tablet Take 20 mg by mouth daily with supper.      No current facility-administered medications for this visit.    Facility-Administered Medications Ordered in Other Visits  Medication Dose Route Frequency Provider Last Rate Last Dose  . sodium chloride flush (NS) 0.9 % injection 10 mL  10 mL Intracatheter PRN Patrici Ranks, MD       Review of Systems  Constitutional: Positive for weight loss. Negative for malaise/fatigue.  HENT: Negative.   Eyes: Negative.  Respiratory: Negative.   Cardiovascular: Negative.   Gastrointestinal: Positive for diarrhea. Negative for abdominal pain.       Abdominal hernia  Genitourinary: Negative.   Musculoskeletal: Negative.   Skin: Negative.   Neurological: Positive for headaches.  Endo/Heme/Allergies: Negative.   Psychiatric/Behavioral: Negative.   All other systems reviewed and are  negative. 14 point ROS was done and is otherwise as detailed above or in HPI  PHYSICAL EXAMINATION: ECOG PERFORMANCE STATUS: 1 - Symptomatic but completely ambulatory  Vitals:   05/24/16 0932  BP: (!) 144/81  Pulse: (!) 55  Resp: 16  Temp: 97.5 F (36.4 C)   Filed Weights   05/24/16 0932  Weight: 121 lb 6.4 oz (55.1 kg)   Physical Exam  Constitutional: She is oriented to person, place, and time and well-developed, well-nourished, and in no distress. No distress.  Well groomed  HENT:  Head: Normocephalic and atraumatic.  Mouth/Throat: Oropharynx is clear and moist. No oropharyngeal exudate.  Eyes: Conjunctivae and EOM are normal. Pupils are equal, round, and reactive to light. Right eye exhibits no discharge. Left eye exhibits no discharge. No scleral icterus.  Neck: Normal range of motion. Neck supple. No tracheal deviation present. No thyromegaly present.  Cardiovascular: Normal rate, regular rhythm and intact distal pulses.   No murmur heard. Pulmonary/Chest: Effort normal and breath sounds normal.  Abdominal: Soft. Bowel sounds are normal. She exhibits no distension and no mass. There is no tenderness. There is no rebound and no guarding.  Abdominal hernia  Musculoskeletal: Normal range of motion. She exhibits no edema or tenderness.  Lymphadenopathy:    She has no cervical adenopathy.  Neurological: She is alert and oriented to person, place, and time. Gait normal. Coordination normal.  Skin: Skin is warm and dry. She is not diaphoretic.  Psychiatric: Mood, memory, affect and judgment normal.  Nursing note and vitals reviewed.  LABORATORY DATA:  I have reviewed the data as listed CBC Latest Ref Rng & Units 05/10/2016 04/25/2016 03/14/2016  WBC 4.0 - 10.5 K/uL 6.0 8.9 6.4  Hemoglobin 12.0 - 15.0 g/dL 9.7(L) 10.9(L) 10.3(L)  Hematocrit 36.0 - 46.0 % 29.5(L) 33.8(L) 31.7(L)  Platelets 150 - 400 K/uL 149(L) 167 152   CMP Latest Ref Rng & Units 05/10/2016 04/25/2016 03/17/2016    Glucose 65 - 99 mg/dL 91 94 94  BUN 6 - 20 mg/dL 17 23(H) 14  Creatinine 0.44 - 1.00 mg/dL 1.00 1.17(H) 1.17(H)  Sodium 135 - 145 mmol/L 137 138 140  Potassium 3.5 - 5.1 mmol/L 4.0 4.7 3.8  Chloride 101 - 111 mmol/L 114(H) 110 111  CO2 22 - 32 mmol/L 18(L) 19(L) 24  Calcium 8.9 - 10.3 mg/dL 8.9 9.5 9.1  Total Protein 6.5 - 8.1 g/dL 6.6 7.1 -  Total Bilirubin 0.3 - 1.2 mg/dL 0.5 0.4 -  Alkaline Phos 38 - 126 U/L 92 92 -  AST 15 - 41 U/L 17 18 -  ALT 14 - 54 U/L 10(L) 11(L) -   PATHOLOGY  RADIOGRAPHIC STUDIES: I have personally reviewed the radiological images as listed and agreed with the findings in the report.  CT CAP w Contrast 05/09/2016 IMPRESSION: CT CHEST IMPRESSION  1. Mild progression of pulmonary metastasis. Although the majority of cavitary lung lesions are primarily similar, there are new and increased small cavitary nodules. 2. Trace residual loculated inferolateral right pleural air. 3. No thoracic adenopathy. 4. Progression of anterior left fifth rib metastasis with soft tissue component surrounding.  CT ABDOMEN AND PELVIS IMPRESSION  1. Status post Whipple procedure, without locally recurrent disease. 2. Similar soft tissue fullness within the retroperitoneum. 3. Pneumobilia.  Possible mild cirrhosis. 4. Similar small bowel containing ventral abdominal wall laxity. 5. Similar abdominal aortic aneurysm.  ASSESSMENT & PLAN:  Stage IV Pancreatic Adenocarcinoma Lung Adenocarcinoma RIJ thrombosis Transverse Sinus Thrombosis Chronic Anticoagulation with lovenox L pneumothorax, spontaneous Malignant Pleural Effusion, Talc Pleurodesis Preserved expression of major/minor MMR proteins done on 2017 LUL biopsy by Dr. Prescott Gum Dyspnea Anemia Macrocytosis B12 deficiency  Proceed with cycle 13 of palliative chemotherapy with liposomal irinotecan/5FU/leucovorin.   I have suggested that she take Imodium every 4-6 hours ATC to assist with her diarrhea.    Repeat restaging CT C/A/P in 2-3 months.   Continue with monthly vitamin B12 injections. Will order repeat vitamin B12 level on next visit.  She will return for follow up in 4 weeks.   All questions were answered. The patient knows to call the clinic with any problems, questions or concerns.  This document serves as a record of services personally performed by Twana First, MD. It was created on her behalf by Martinique Casey, a trained medical scribe. The creation of this record is based on the scribe's personal observations and the provider's statements to them. This document has been checked and approved by the attending provider.  I have reviewed the above documentation for accuracy and completeness and I agree with the above.  This note was electronically signed.    Martinique M Casey  05/24/2016 9:35 AM

## 2016-05-24 NOTE — Patient Instructions (Signed)
[name] [name] at [facility] Discharge Instructions  RECOMMENDATIONS MADE BY THE CONSULTANT AND ANY TEST RESULTS WILL BE SENT TO YOUR REFERRING PHYSICIAN.  You were seen today by Dr. Twana First Follow up in 4 weeks with lab work See Amy up front for appointments   Thank you for choosing [place] at [facility] to provide your oncology and hematology care.  To afford each patient quality time with our provider, please arrive at least 15 minutes before your scheduled appointment time.    If you have a lab appointment with the [facility] please come in thru the  Main Entrance and check in at the main information desk  You need to re-schedule your appointment should you arrive 10 or more minutes late.  We strive to give you quality time with our providers, and arriving late affects you and other patients whose appointments are after yours.  Also, if you no show three or more times for appointments you may be dismissed from the clinic at the providers discretion.     Again, thank you for choosing [facility].  Our hope is that these requests will decrease the amount of time that you wait before being seen by our physicians.       _____________________________________________________________  Should you have questions after your visit to [facility], please contact our office at [phone] between the hours of 8:30 a.m. and 4:30 p.m.  Voicemails left after 4:30 p.m. will not be returned until the following business day.  For prescription refill requests, have your pharmacy contact our office.       Resources For Cancer Patients and their Caregivers ? American Cancer Society: Can assist with transportation, wigs, general needs, runs Look Good Feel Better.        3393591167 ? Cancer Care: Provides financial assistance, online support groups, medication/co-pay assistance.  1-800-813-HOPE 281-606-9987) ? [place] [facility] [place] cancer patients and their families through emotional , educational and financial support.  312-476-2883 ? [place] DSS Where to apply for food stamps, Medicaid and utility assistance. 959-309-3800 ? RCATS: Transportation to medical appointments. (551)727-9502 ? Social Security Administration: May apply for disability if have a Stage IV cancer. 570-500-8682 (910) 065-9408 ? [employer], Disability and Transit Services: Assists with nutrition, care and transit needs. [place] Support Programs: '@10RELATIVEDAYS'$ @ > Cancer Support Group  2nd Tuesday of the month 1pm-2pm, Journey Room  > Creative Journey  3rd Tuesday of the month 1130am-1pm, Journey Room  > Look Good Feel Better  1st Wednesday of the month 10am-12 noon, Journey Room (Call [place] to register [phone])

## 2016-05-24 NOTE — Progress Notes (Signed)
Tolerated chemo well. Continuous infusion pump intact. Stable and ambulatory on discharge home with sister.

## 2016-05-24 NOTE — Patient Instructions (Signed)
[facility] Discharge Instructions for Patients Receiving Chemotherapy   Beginning January 23rd 2017 lab work for the [facility] will be done in the  Main lab at [facility] on 1st floor. If you have a lab appointment with the [facility] please come in thru the  Main Entrance and check in at the main information desk   Today you received the following chemotherapy agents irinotecan liposomal, leucovorin and 45f pump.  To help prevent nausea and vomiting after your treatment, we encourage you to take your nausea medication as instructed.  If you develop nausea and vomiting, or diarrhea that is not controlled by your medication, call the clinic.  The clinic phone number is [phone] Office hours are Monday-Friday 8:30am-5:00pm.  BELOW ARE SYMPTOMS THAT SHOULD BE REPORTED IMMEDIATELY:  *FEVER GREATER THAN 101.0 F  *CHILLS WITH OR WITHOUT FEVER  NAUSEA AND VOMITING THAT IS NOT CONTROLLED WITH YOUR NAUSEA MEDICATION  *UNUSUAL SHORTNESS OF BREATH  *UNUSUAL BRUISING OR BLEEDING  TENDERNESS IN MOUTH AND THROAT WITH OR WITHOUT PRESENCE OF ULCERS  *URINARY PROBLEMS  *BOWEL PROBLEMS  UNUSUAL RASH Items with * indicate a potential emergency and should be followed up as soon as possible. If you have an emergency after office hours please contact your primary care physician or go to the nearest emergency department.  Please call the clinic during office hours if you have any questions or concerns.   You may also contact the Patient Navigator at [phone] you have any questions or need assistance in obtaining follow up care.      Resources For Cancer Patients and their Caregivers ? American Cancer Society: Can assist with transportation, wigs, general needs, runs Look Good Feel Better.        1458-879-3288? Cancer Care: Provides financial assistance, online support groups, medication/co-pay assistance.  1-800-813-HOPE (236-492-0871 ? [place] [place] cancer patients and their families through emotional , educational and financial support.  3805 510 7153? [place] DSS Where to apply for food stamps, Medicaid and utility assistance. 3(431) 040-2347? RCATS: Transportation to medical appointments. 3(250)633-8375? Social Security Administration: May apply for disability if have a Stage IV cancer. 3310 209 26841(973)340-4549? [employer] Disability and Transit Services: Assists with nutrition, care and transit needs. 3410-678-1499

## 2016-05-26 ENCOUNTER — Encounter (HOSPITAL_BASED_OUTPATIENT_CLINIC_OR_DEPARTMENT_OTHER): Payer: Medicare Other

## 2016-05-26 ENCOUNTER — Ambulatory Visit (HOSPITAL_COMMUNITY): Payer: Medicare Other

## 2016-05-26 VITALS — BP 134/53 | HR 67 | Temp 97.8°F | Resp 20

## 2016-05-26 DIAGNOSIS — C259 Malignant neoplasm of pancreas, unspecified: Secondary | ICD-10-CM | POA: Diagnosis not present

## 2016-05-26 DIAGNOSIS — Z452 Encounter for adjustment and management of vascular access device: Secondary | ICD-10-CM | POA: Diagnosis present

## 2016-05-26 DIAGNOSIS — C7800 Secondary malignant neoplasm of unspecified lung: Secondary | ICD-10-CM | POA: Diagnosis not present

## 2016-05-26 MED ORDER — HEPARIN SOD (PORK) LOCK FLUSH 100 UNIT/ML IV SOLN
500.0000 [IU] | Freq: Once | INTRAVENOUS | Status: AC | PRN
  Administered 2016-05-26: 500 [IU]

## 2016-05-26 MED ORDER — SODIUM CHLORIDE 0.9% FLUSH
10.0000 mL | INTRAVENOUS | Status: DC | PRN
  Administered 2016-05-26: 10 mL
  Filled 2016-05-26: qty 10

## 2016-05-26 NOTE — Progress Notes (Signed)
[name] [name] presented for pump removal & portacath deaccess and flush. Portacath located rt chest wall accessed with  H 20 needle. Good blood return present. Portacath flushed with 26m NS and 500U/597mHeparin and needle removed intact. Procedure without incident. Patient tolerated procedure well.  Pt states that she thinks the imodium is helping her diarrhea. Pt instructed to call if diarrhea does not get better. She said ok.

## 2016-05-27 LAB — CANCER ANTIGEN 19-9: CA 19-9: 973 U/mL — ABNORMAL HIGH (ref 0–35)

## 2016-06-07 ENCOUNTER — Encounter (HOSPITAL_COMMUNITY): Payer: Self-pay

## 2016-06-07 ENCOUNTER — Encounter (HOSPITAL_BASED_OUTPATIENT_CLINIC_OR_DEPARTMENT_OTHER): Payer: Medicare Other

## 2016-06-07 VITALS — BP 109/36 | HR 47 | Temp 97.9°F | Resp 18 | Wt 118.8 lb

## 2016-06-07 DIAGNOSIS — C259 Malignant neoplasm of pancreas, unspecified: Secondary | ICD-10-CM | POA: Diagnosis not present

## 2016-06-07 DIAGNOSIS — Z95828 Presence of other vascular implants and grafts: Secondary | ICD-10-CM | POA: Diagnosis not present

## 2016-06-07 DIAGNOSIS — R197 Diarrhea, unspecified: Secondary | ICD-10-CM | POA: Diagnosis not present

## 2016-06-07 DIAGNOSIS — C7802 Secondary malignant neoplasm of left lung: Secondary | ICD-10-CM

## 2016-06-07 DIAGNOSIS — Z5111 Encounter for antineoplastic chemotherapy: Secondary | ICD-10-CM | POA: Diagnosis present

## 2016-06-07 DIAGNOSIS — C7800 Secondary malignant neoplasm of unspecified lung: Principal | ICD-10-CM

## 2016-06-07 LAB — COMPREHENSIVE METABOLIC PANEL
ALBUMIN: 3.5 g/dL (ref 3.5–5.0)
ALK PHOS: 110 U/L (ref 38–126)
ALT: 11 U/L — AB (ref 14–54)
AST: 17 U/L (ref 15–41)
Anion gap: 8 (ref 5–15)
BILIRUBIN TOTAL: 0.7 mg/dL (ref 0.3–1.2)
BUN: 22 mg/dL — AB (ref 6–20)
CO2: 17 mmol/L — ABNORMAL LOW (ref 22–32)
CREATININE: 1.19 mg/dL — AB (ref 0.44–1.00)
Calcium: 9.1 mg/dL (ref 8.9–10.3)
Chloride: 110 mmol/L (ref 101–111)
GFR calc Af Amer: 49 mL/min — ABNORMAL LOW (ref >60)
GFR, EST NON AFRICAN AMERICAN: 43 mL/min — AB (ref >60)
GLUCOSE: 147 mg/dL — AB (ref 65–99)
Potassium: 5 mmol/L (ref 3.5–5.1)
Sodium: 135 mmol/L (ref 135–145)
TOTAL PROTEIN: 6.5 g/dL (ref 6.5–8.1)

## 2016-06-07 LAB — CBC WITH DIFFERENTIAL/PLATELET
BASOS ABS: 0 10*3/uL (ref 0.0–0.1)
BASOS PCT: 0 %
EOS PCT: 0 %
Eosinophils Absolute: 0 10*3/uL (ref 0.0–0.7)
HEMATOCRIT: 33.7 % — AB (ref 36.0–46.0)
HEMOGLOBIN: 10.7 g/dL — AB (ref 12.0–15.0)
Lymphocytes Relative: 17 %
Lymphs Abs: 1.1 10*3/uL (ref 0.7–4.0)
MCH: 31.5 pg (ref 26.0–34.0)
MCHC: 31.8 g/dL (ref 30.0–36.0)
MCV: 99.1 fL (ref 78.0–100.0)
MONO ABS: 0.7 10*3/uL (ref 0.1–1.0)
Monocytes Relative: 10 %
NEUTROS ABS: 4.7 10*3/uL (ref 1.7–7.7)
Neutrophils Relative %: 73 %
Platelets: 168 10*3/uL (ref 150–400)
RBC: 3.4 MIL/uL — AB (ref 3.87–5.11)
RDW: 16.9 % — ABNORMAL HIGH (ref 11.5–15.5)
WBC: 6.5 10*3/uL (ref 4.0–10.5)

## 2016-06-07 MED ORDER — PROCHLORPERAZINE MALEATE 10 MG PO TABS
10.0000 mg | ORAL_TABLET | Freq: Once | ORAL | Status: AC
  Administered 2016-06-07: 10 mg via ORAL
  Filled 2016-06-07: qty 1

## 2016-06-07 MED ORDER — LEUCOVORIN CALCIUM INJECTION 350 MG
400.0000 mg/m2 | Freq: Once | INTRAVENOUS | Status: DC
  Filled 2016-06-07: qty 32.6

## 2016-06-07 MED ORDER — DEXTROSE 5 % IV SOLN
400.0000 mg/m2 | Freq: Once | INTRAVENOUS | Status: AC
Start: 2016-06-07 — End: 2016-06-07
  Administered 2016-06-07: 652 mg via INTRAVENOUS
  Filled 2016-06-07: qty 32.6

## 2016-06-07 MED ORDER — PALONOSETRON HCL INJECTION 0.25 MG/5ML
0.2500 mg | Freq: Once | INTRAVENOUS | Status: AC
  Administered 2016-06-07: 0.25 mg via INTRAVENOUS
  Filled 2016-06-07: qty 5

## 2016-06-07 MED ORDER — SODIUM CHLORIDE 0.9 % IV SOLN
Freq: Once | INTRAVENOUS | Status: AC
  Administered 2016-06-07: 12:00:00 via INTRAVENOUS

## 2016-06-07 MED ORDER — SODIUM CHLORIDE 0.9 % IV SOLN
1500.0000 mg/m2 | INTRAVENOUS | Status: DC
  Administered 2016-06-07: 2450 mg via INTRAVENOUS
  Filled 2016-06-07: qty 49

## 2016-06-07 MED ORDER — IRINOTECAN HCL LIPOSOME CHEMO INJECTION 43 MG/10ML
35.0000 mg/m2 | INJECTION | Freq: Once | INTRAVENOUS | Status: AC
  Administered 2016-06-07: 55.9 mg via INTRAVENOUS
  Filled 2016-06-07: qty 13

## 2016-06-07 NOTE — Patient Instructions (Signed)
[facility] Discharge Instructions for Patients Receiving Chemotherapy   Beginning January 23rd 2017 lab work for the [facility] will be done in the  Main lab at [facility] on 1st floor. If you have a lab appointment with the [facility] please come in thru the  Main Entrance and check in at the main information desk   Today you received the following chemotherapy agents: Onivyde, leucovorin, and 30f  If you develop nausea and vomiting, or diarrhea that is not controlled by your medication, call the clinic.  The clinic phone number is [phone] Office hours are Monday-Friday 8:30am-5:00pm.  BELOW ARE SYMPTOMS THAT SHOULD BE REPORTED IMMEDIATELY:  *FEVER GREATER THAN 101.0 F  *CHILLS WITH OR WITHOUT FEVER  NAUSEA AND VOMITING THAT IS NOT CONTROLLED WITH YOUR NAUSEA MEDICATION  *UNUSUAL SHORTNESS OF BREATH  *UNUSUAL BRUISING OR BLEEDING  TENDERNESS IN MOUTH AND THROAT WITH OR WITHOUT PRESENCE OF ULCERS  *URINARY PROBLEMS  *BOWEL PROBLEMS  UNUSUAL RASH Items with * indicate a potential emergency and should be followed up as soon as possible. If you have an emergency after office hours please contact your primary care physician or go to the nearest emergency department.  Please call the clinic during office hours if you have any questions or concerns.   You may also contact the Patient Navigator at [phone] you have any questions or need assistance in obtaining follow up care.      Resources For Cancer Patients and their Caregivers ? American Cancer Society: Can assist with transportation, wigs, general needs, runs Look Good Feel Better.        1620 057 6148? Cancer Care: Provides financial assistance, online support groups, medication/co-pay assistance.  1-800-813-HOPE (916-338-9040 ? [place] cancer patients and their families through emotional , educational and financial support.   3(832)292-1536? [place] DSS Where to apply for food stamps, Medicaid and utility assistance. 3409-226-1113? RCATS: Transportation to medical appointments. 3(301)714-9494? Social Security Administration: May apply for disability if have a Stage IV cancer. 3316-211-18061702-131-6394? [employer] Disability and Transit Services: Assists with nutrition, care and transit needs. 3(719)827-2730

## 2016-06-07 NOTE — Progress Notes (Signed)
Tolerated tx w/o adverse reaction. Alert, in no distress.  VSS.  Discharged ambulatory in c/o family for transport home.

## 2016-06-09 ENCOUNTER — Encounter (HOSPITAL_BASED_OUTPATIENT_CLINIC_OR_DEPARTMENT_OTHER): Payer: Medicare Other

## 2016-06-09 VITALS — BP 127/44 | HR 58 | Temp 98.0°F | Resp 18

## 2016-06-09 DIAGNOSIS — C7800 Secondary malignant neoplasm of unspecified lung: Principal | ICD-10-CM

## 2016-06-09 DIAGNOSIS — C259 Malignant neoplasm of pancreas, unspecified: Secondary | ICD-10-CM

## 2016-06-09 DIAGNOSIS — E876 Hypokalemia: Secondary | ICD-10-CM

## 2016-06-09 DIAGNOSIS — Z452 Encounter for adjustment and management of vascular access device: Secondary | ICD-10-CM | POA: Diagnosis not present

## 2016-06-09 DIAGNOSIS — C7802 Secondary malignant neoplasm of left lung: Secondary | ICD-10-CM

## 2016-06-09 MED ORDER — POTASSIUM CHLORIDE CRYS ER 20 MEQ PO TBCR: 20.0000 meq | EXTENDED_RELEASE_TABLET | Freq: Once | ORAL | 0 refills | Status: DC

## 2016-06-09 MED ORDER — SODIUM CHLORIDE 0.9% FLUSH
10.0000 mL | INTRAVENOUS | Status: DC | PRN
  Administered 2016-06-09: 10 mL
  Filled 2016-06-09: qty 10

## 2016-06-09 MED ORDER — HEPARIN SOD (PORK) LOCK FLUSH 100 UNIT/ML IV SOLN
500.0000 [IU] | Freq: Once | INTRAVENOUS | Status: AC | PRN
  Administered 2016-06-09: 500 [IU]
  Filled 2016-06-09: qty 5

## 2016-06-09 NOTE — Progress Notes (Signed)
Continuous infusion pump stopped/completed on arrival. Disconnected pump from patient, flushed port per protocol and removed needle. Patient denies complaints post chemo. Stable and ambulatory on discharge home to self.

## 2016-06-09 NOTE — Patient Instructions (Signed)
[facility] Discharge Instructions  RECOMMENDATIONS MADE BY THE CONSULTANT AND ANY TEST RESULTS WILL BE SENT TO YOUR REFERRING PHYSICIAN.  Continuous infusion pump disconnected and port flushed. Return as scheduled.  Thank you for choosing [place] at [facility] to provide your oncology and hematology care.  To afford each patient quality time with our provider, please arrive at least 15 minutes before your scheduled appointment time.    If you have a lab appointment with the [facility] please come in thru the  Main Entrance and check in at the main information desk  You need to re-schedule your appointment should you arrive 10 or more minutes late.  We strive to give you quality time with our providers, and arriving late affects you and other patients whose appointments are after yours.  Also, if you no show three or more times for appointments you may be dismissed from the clinic at the providers discretion.     Again, thank you for choosing [facility].  Our hope is that these requests will decrease the amount of time that you wait before being seen by our physicians.       _____________________________________________________________  Should you have questions after your visit to [facility], please contact our office at [phone] between the hours of 8:30 a.m. and 4:30 p.m.  Voicemails left after 4:30 p.m. will not be returned until the following business day.  For prescription refill requests, have your pharmacy contact our office.       Resources For Cancer Patients and their Caregivers ? American Cancer Society: Can assist with transportation, wigs, general needs, runs Look Good Feel Better.        (443)322-6729 ? Cancer Care: Provides financial assistance, online support groups, medication/co-pay assistance.  1-800-813-HOPE 757-315-4209) ? Renisha Cockrum [place] Assists  [place] cancer patients and their families through emotional , educational and financial support.  513 771 3070 ? [place] DSS Where to apply for food stamps, Medicaid and utility assistance. 404-069-5461 ? RCATS: Transportation to medical appointments. (641) 771-1757 ? Social Security Administration: May apply for disability if have a Stage IV cancer. 906 270 8988 209-292-4679 ? [employer], Disability and Transit Services: Assists with nutrition, care and transit needs. [place] Support Programs: '@10RELATIVEDAYS'$ @ > Cancer Support Group  2nd Tuesday of the month 1pm-2pm, Journey Room  > Creative Journey  3rd Tuesday of the month 1130am-1pm, Journey Room  > Look Good Feel Better  1st Wednesday of the month 10am-12 noon, Journey Room (Call [place] to register [phone])

## 2016-06-21 ENCOUNTER — Encounter (HOSPITAL_BASED_OUTPATIENT_CLINIC_OR_DEPARTMENT_OTHER): Payer: Medicare Other | Admitting: Oncology

## 2016-06-21 ENCOUNTER — Encounter (HOSPITAL_COMMUNITY): Payer: Medicare Other | Attending: Adult Health

## 2016-06-21 ENCOUNTER — Other Ambulatory Visit (HOSPITAL_COMMUNITY): Payer: Self-pay | Admitting: Oncology

## 2016-06-21 ENCOUNTER — Encounter (HOSPITAL_COMMUNITY): Payer: Self-pay

## 2016-06-21 VITALS — BP 104/52 | HR 51 | Temp 97.5°F | Resp 18

## 2016-06-21 VITALS — BP 145/57 | HR 52 | Temp 97.4°F | Resp 18 | Wt 120.0 lb

## 2016-06-21 DIAGNOSIS — D649 Anemia, unspecified: Secondary | ICD-10-CM

## 2016-06-21 DIAGNOSIS — C7802 Secondary malignant neoplasm of left lung: Secondary | ICD-10-CM

## 2016-06-21 DIAGNOSIS — C259 Malignant neoplasm of pancreas, unspecified: Secondary | ICD-10-CM | POA: Diagnosis not present

## 2016-06-21 DIAGNOSIS — Z5111 Encounter for antineoplastic chemotherapy: Secondary | ICD-10-CM

## 2016-06-21 DIAGNOSIS — R197 Diarrhea, unspecified: Secondary | ICD-10-CM | POA: Diagnosis not present

## 2016-06-21 DIAGNOSIS — Z95828 Presence of other vascular implants and grafts: Secondary | ICD-10-CM | POA: Diagnosis not present

## 2016-06-21 DIAGNOSIS — C7800 Secondary malignant neoplasm of unspecified lung: Secondary | ICD-10-CM | POA: Diagnosis not present

## 2016-06-21 DIAGNOSIS — R1310 Dysphagia, unspecified: Secondary | ICD-10-CM | POA: Diagnosis not present

## 2016-06-21 DIAGNOSIS — E538 Deficiency of other specified B group vitamins: Secondary | ICD-10-CM | POA: Diagnosis not present

## 2016-06-21 LAB — COMPREHENSIVE METABOLIC PANEL
ALK PHOS: 109 U/L (ref 38–126)
ALT: 12 U/L — ABNORMAL LOW (ref 14–54)
AST: 16 U/L (ref 15–41)
Albumin: 3.3 g/dL — ABNORMAL LOW (ref 3.5–5.0)
Anion gap: 7 (ref 5–15)
BILIRUBIN TOTAL: 0.4 mg/dL (ref 0.3–1.2)
BUN: 26 mg/dL — AB (ref 6–20)
CALCIUM: 9 mg/dL (ref 8.9–10.3)
CO2: 18 mmol/L — ABNORMAL LOW (ref 22–32)
CREATININE: 1.1 mg/dL — AB (ref 0.44–1.00)
Chloride: 111 mmol/L (ref 101–111)
GFR calc Af Amer: 54 mL/min — ABNORMAL LOW (ref >60)
GFR, EST NON AFRICAN AMERICAN: 47 mL/min — AB (ref >60)
Glucose, Bld: 79 mg/dL (ref 65–99)
POTASSIUM: 5.3 mmol/L — AB (ref 3.5–5.1)
Sodium: 136 mmol/L (ref 135–145)
TOTAL PROTEIN: 6.2 g/dL — AB (ref 6.5–8.1)

## 2016-06-21 LAB — CBC WITH DIFFERENTIAL/PLATELET
BASOS PCT: 0 %
Basophils Absolute: 0 10*3/uL (ref 0.0–0.1)
Eosinophils Absolute: 0 10*3/uL (ref 0.0–0.7)
Eosinophils Relative: 1 %
HEMATOCRIT: 28.9 % — AB (ref 36.0–46.0)
HEMOGLOBIN: 9.3 g/dL — AB (ref 12.0–15.0)
LYMPHS ABS: 1.3 10*3/uL (ref 0.7–4.0)
Lymphocytes Relative: 20 %
MCH: 31 pg (ref 26.0–34.0)
MCHC: 32.2 g/dL (ref 30.0–36.0)
MCV: 96.3 fL (ref 78.0–100.0)
MONO ABS: 0.7 10*3/uL (ref 0.1–1.0)
MONOS PCT: 11 %
NEUTROS ABS: 4.4 10*3/uL (ref 1.7–7.7)
NEUTROS PCT: 68 %
Platelets: 139 10*3/uL — ABNORMAL LOW (ref 150–400)
RBC: 3 MIL/uL — ABNORMAL LOW (ref 3.87–5.11)
RDW: 16.3 % — AB (ref 11.5–15.5)
WBC: 6.4 10*3/uL (ref 4.0–10.5)

## 2016-06-21 MED ORDER — SODIUM CHLORIDE 0.9 % IV SOLN
1500.0000 mg/m2 | INTRAVENOUS | Status: DC
  Administered 2016-06-21: 2450 mg via INTRAVENOUS
  Filled 2016-06-21: qty 49

## 2016-06-21 MED ORDER — LEUCOVORIN CALCIUM INJECTION 350 MG
400.0000 mg/m2 | Freq: Once | INTRAVENOUS | Status: AC
  Administered 2016-06-21: 652 mg via INTRAVENOUS
  Filled 2016-06-21: qty 32.6

## 2016-06-21 MED ORDER — PALONOSETRON HCL INJECTION 0.25 MG/5ML
0.2500 mg | Freq: Once | INTRAVENOUS | Status: AC
  Administered 2016-06-21: 0.25 mg via INTRAVENOUS
  Filled 2016-06-21: qty 5

## 2016-06-21 MED ORDER — SODIUM CHLORIDE 0.9 % IV SOLN
Freq: Once | INTRAVENOUS | Status: AC
  Administered 2016-06-21: 11:00:00 via INTRAVENOUS

## 2016-06-21 MED ORDER — PROCHLORPERAZINE MALEATE 10 MG PO TABS
10.0000 mg | ORAL_TABLET | Freq: Once | ORAL | Status: AC
  Administered 2016-06-21: 10 mg via ORAL
  Filled 2016-06-21: qty 1

## 2016-06-21 MED ORDER — IRINOTECAN HCL LIPOSOME CHEMO INJECTION 43 MG/10ML
35.0000 mg/m2 | INJECTION | Freq: Once | INTRAVENOUS | Status: AC
  Administered 2016-06-21: 55.9 mg via INTRAVENOUS
  Filled 2016-06-21: qty 13

## 2016-06-21 MED ORDER — SODIUM CHLORIDE 0.9% FLUSH
10.0000 mL | INTRAVENOUS | Status: DC | PRN
  Administered 2016-06-21: 10 mL
  Filled 2016-06-21: qty 10

## 2016-06-21 MED ORDER — DARBEPOETIN ALFA 500 MCG/ML IJ SOSY
500.0000 ug | PREFILLED_SYRINGE | Freq: Once | INTRAMUSCULAR | Status: AC
  Administered 2016-06-21: 500 ug via SUBCUTANEOUS
  Filled 2016-06-21: qty 1

## 2016-06-21 MED ORDER — DEXTROSE 5 % IV SOLN
400.0000 mg/m2 | Freq: Once | INTRAVENOUS | Status: DC
  Filled 2016-06-21: qty 32.6

## 2016-06-21 NOTE — Progress Notes (Signed)
[facility], [place] [address]  Pancreatic Cancer  CURRENT THERAPY: Liposome Irinotecan/5FU/Leucovorin  INTERVAL HISTORY: [name] [name] 76 y.o. female returns for followup of stage IV pancreatic carcinoma, she is also reported to have a history of stage IV BAC of the lungs. (On further review I feel she may have had stage I BAC of the lungs).  She is currently onliposomal irinotecan/5-FU.  AND Recurrent pneumothorax requiring chest tube placement, followed/managed by Dr. Prescott Gum, resulting in [place] chemotherapy from 03/01/2016- March 2018.    Pancreatic cancer metastasized to lung [facility])   09/03/2008 Surgery    Whipple with Dr. Birdie Sons for T3N1 3/12 LN+ Pancreatic Adenocarcinoma      11/13/2008 - 09/12/2009 Chemotherapy    Adjuvant Gemcitabine X 6 months      08/31/2009 Surgery    Metastectomy with Dr. Arlyce Dice RUL 2 nodules, RLL (wedge resection X 2)      08/31/2009 Pathology Results    Metastatic adenocarcinoma TTF-1 negative      06/03/2010 Surgery    Left video-assisted thoracoscopic surgery, resection of left lower lobe lesion. with Dr. Arlyce Dice      06/04/2010 Pathology Results    adenocarcinoma positive for cytokeratin 7, focally positive for CDX2, with scattered staining for TTF1 cytokeratin 20. [facility] opinion felt to be lung primary although noted to stain for GI markers, not felt to be pancreatic primary. EGFR and ALK negative      03/31/2011 - 09/04/2011 Radiation Therapy    SBRT LUL 18Gy Dr. Tammi Klippel      03/15/2012 - 06/13/2012 Chemotherapy    5-FU based chemotherapy      02/13/2013 - 05/20/2013 Chemotherapy    Gemzar Abraxane for metastatic disease (given to cover both primaries at [facility])      07/15/2014 PET scan    Mild progression of pulm mets, hypermet mesenteric tumor assoc with  SMA is stable to slightly increased in degree of FDG uptake      07/23/2014 Imaging         07/30/2014 - 09/10/2014 Chemotherapy   Gemzar abraxane stopped due to poor tolerance      04/06/2015 Procedure    L 20 French chest tube for spontaneous pneumothorax, by Dr. Tharon Aquas Trigt      04/20/2015 Pathology Results    High grade carcinoma c/w patient's known pancreatic primary (secondary opinion at [facility]) PDL-1 high expression, preserved major and minor MMR, MSI stable, Foundation ONE testing performed,       04/20/2015 Surgery    Left VATS, mini thoracotomy with stapling and oversewing of blebs, Talc pleurodesis. performed secondary to persistent air leak not improved with chest tube drainage      07/29/2015 Imaging    No signif change in appear of multifocal B cavitary pulmonary lesions, postop appearance upper abdomen, no findings of met disease to abd or pelvis,      09/29/2015 Imaging    Slight progression of multifocal cavitary pulmonary metastases, as discussed above. 2. Interval development of a lytic lesion in the antral lateral aspect of the left fifth rib where there is a nondisplaced pathologic fracture. No other definite osseous metastases are identified. 3. Prominent soft tissue adjacent to the proximal superior mesenteric artery and in the aortocaval nodal station, similar to prior examinations, likely to represent treated metastatic lymphadenopathy. 4. Epigastric ventral hernia containing several short loops of small bowel. There is a focally dilated loop of small bowel adjacent to  this, however, this is an isolated finding. No other dilatation of small bowel or colon to suggest frank bowel obstruction at this time.       10/19/2015 - 03/01/2016 Chemotherapy    The patient had palonosetron (ALOXI) injection 0.25 mg, 0.25 mg, Intravenous,  Once, 10 of 12 cycles  leucovorin 652 mg in dextrose 5 % 250 mL infusion, 400 mg/m2 = 652 mg, Intravenous,  Once, 10 of 12 cycles  fluorouracil (ADRUCIL) 2,950 mg in sodium chloride 0.9 % 91 mL chemo infusion, 1,800 mg/m2 = 2,950 mg (100 % of  original dose 1,800 mg/m2), Intravenous, 1 Day/Dose, 10 of 12 cycles Dose modification: 1,920 mg/m2 (original dose 1,800 mg/m2, Cycle 1, Reason: Provider Judgment), 1,800 mg/m2 (original dose 1,800 mg/m2, Cycle 1, Reason: Provider Judgment), 1,500 mg/m2 (original dose 1,800 mg/m2, Cycle 2, Reason: Dose not tolerated)  irinotecan LIPOSOME (ONIVYDE) 55.9 mg in sodium chloride 0.9 % 500 mL chemo infusion, 35 mg/m2 = 55.9 mg (100 % of original dose 35 mg/m2), Intravenous, Once, 10 of 12 cycles Dose modification: 35 mg/m2 (original dose 35 mg/m2, Cycle 1, Reason: Provider Judgment)  for chemotherapy treatment.        01/12/2016 Imaging    CT CAP- 1. Cavitary lung metastases are stable to decreased in size. 2. Expansile mixed lytic and sclerotic bone metastasis in the anterior left fifth rib is increased in sclerosis and mildly increased in size. 3. Infiltrative soft tissue in the retroperitoneum abutting the SMA is stable. 4. No new sites of metastatic disease in the chest, abdomen or pelvis.      03/05/2016 Procedure    Chest tube placed for spontaneous pneumothorax by Dr. Cyndia Bent      03/05/2016 - 03/10/2016 Hospital Admission    Admit date: 03/05/2016 Admission diagnosis: Spontenous pneumothorax Additional comments: chest tube placed by Dr. Cyndia Bent on 03/05/2016      03/14/2016 - 03/18/2016 Hospital Admission    Admit date: 03/14/2016 Admission diagnosis: Recurrent spontaneous pneumothorax Additional comments:       04/26/2016 -  Chemotherapy    The patient had palonosetron (ALOXI) injection 0.25 mg, 0.25 mg, Intravenous,  Once, 10 of 12 cycles  leucovorin 652 mg in dextrose 5 % 250 mL infusion, 400 mg/m2 = 652 mg, Intravenous,  Once, 10 of 12 cycles  fluorouracil (ADRUCIL) 2,950 mg in sodium chloride 0.9 % 91 mL chemo infusion, 1,800 mg/m2 = 2,950 mg (100 % of original dose 1,800 mg/m2), Intravenous, 1 Day/Dose, 10 of 12 cycles Dose modification: 1,920 mg/m2 (original dose 1,800 mg/m2,  Cycle 1, Reason: Provider Judgment), 1,800 mg/m2 (original dose 1,800 mg/m2, Cycle 1, Reason: Provider Judgment), 1,500 mg/m2 (original dose 1,800 mg/m2, Cycle 2, Reason: Dose not tolerated)  irinotecan LIPOSOME (ONIVYDE) 55.9 mg in sodium chloride 0.9 % 500 mL chemo infusion, 35 mg/m2 = 55.9 mg (100 % of original dose 35 mg/m2), Intravenous, Once, 10 of 12 cycles Dose modification: 35 mg/m2 (original dose 35 mg/m2, Cycle 1, Reason: Provider Judgment)  for chemotherapy treatment.        05/09/2016 Imaging    CT CAP- CT CHEST IMPRESSION  1. Mild progression of pulmonary metastasis. Although the majority of cavitary lung lesions are primarily similar, there are new and increased small cavitary nodules. 2. Trace residual loculated inferolateral right pleural air. 3. No thoracic adenopathy. 4. Progression of anterior left fifth rib metastasis with soft tissue component surrounding.  CT ABDOMEN AND PELVIS IMPRESSION  1. Status post Whipple procedure, without locally recurrent disease. 2. Similar soft tissue  fullness within the retroperitoneum. 3. Pneumobilia. Possible mild cirrhosis. 4. Similar small bowel containing ventral abdominal wall laxity. 5. Similar abdominal aortic aneurysm.      05/09/2016 Progression    Progression of disease after being off therapy x 2 months.       Mrs. Setterlund presents to the clinic for continuing follow up. She is scheduled for cycle 15 Liposomal trinotecan/ Lucovorin today.    She reports diarrhea.  She notes that she feels a discomfort with her rectum. She does not know if the rectal pain is associated with the constant diarrhea. She notes she sometimes has 3-4 bowel movements and sometimes she has 1. She denies any blood in her stool. She notes she is taking imodium and it is helping.   She states that her gums feel full and her mouth feels swollen. She notices that she bites her gums and jaw due to it feeling swollen. She notes that this week it  is better.  She is experiencing dysphagia.    She notes that she is eating ok. "Some nights I eat better than others".  Her weight is stable over the past 2 months.  Her appetite is fair and she is using Ensure/boost to help supplement her diet.  She reports that her nose and eyes are constantly running and watery. Denies accompanying fevers and chills. I told her that it was likely due to seasonal allergies.  She is concerned she is "slowing down."  She is unsure if it is because of growing older or her cancer.     Review of Systems  Constitutional: Negative.  Negative for chills and fever.       Appetite is fair, weight is stable.  HENT: Negative.        Rhinorrhea  Mouth feels "full" and swollen. Dysphagia   Eyes: Positive for discharge (watery eyes).  Respiratory: Negative.   Cardiovascular: Negative.   Gastrointestinal: Positive for diarrhea (controlled with immodium ). Negative for blood in stool.       Rectal discomfort/pain.  Genitourinary: Negative.   Musculoskeletal: Negative.   Skin: Negative.   Neurological: Negative.   Endo/Heme/Allergies: Negative.   Psychiatric/Behavioral: Negative.     Past Medical History:  Diagnosis Date  . Abdominal wall hernia   . Anemia   . Aortic aneurysm (HCC)    small aortic aneurysm  . Arthritis    "little in my right hand" (03/10/2016)  . Cervical cancer ([place]) 1963  . COPD (chronic obstructive pulmonary disease) ([place])   . Emphysema   . GERD (gastroesophageal reflux disease)   . Goals of care, counseling/discussion 02/02/2016  . Hypercholesterolemia   . Hypertension   . Pancreatic adenocarcinoma [facility]) July 2010  . Pancreatic cancer metastasized to lung [facility]) 09/04/2015    Past Surgical History:  Procedure Laterality Date  . BREAST CYST EXCISION Right 1988  . BUNIONECTOMY Bilateral 2007  . CARDIAC CATHETERIZATION    . CATARACT EXTRACTION W/ INTRAOCULAR LENS  IMPLANT, BILATERAL Bilateral 2009  . CHOLECYSTECTOMY  July 2010     done w/ Whipple procedure  . IR GENERIC HISTORICAL  03/15/2016   IR GUIDED DRAIN W CATHETER PLACEMENT 03/15/2016 Greggory Keen, MD [facility]-INTERV RAD  . SKIN CANCER EXCISION Left    nasal fold  . STAPLING OF BLEBS Left 04/20/2015   Procedure: STAPLING OF BLEBS;  Surgeon: Ivin Poot, MD;  Location: [facility];  Service: Thoracic;  Laterality: Left;  . THYROID SURGERY     "had a growth taken off"  .  TONSILLECTOMY  1943  . VAGINAL HYSTERECTOMY  1963   Cervical Cancer  . VIDEO ASSISTED THORACOSCOPY Left 04/20/2015   Procedure: VIDEO ASSISTED THORACOSCOPY;  Surgeon: Ivin Poot, MD;  Location: [facility];  Service: Thoracic;  Laterality: Left;  . WHIPPLE PROCEDURE  July 2010   pancreatic adenocarcinoma    Family History  Problem Relation Age of Onset  . Pancreatic cancer Brother   . Colon cancer Sister 84    12/2006 dx surgery and chemotherapy  . Ovarian cancer Sister 70    Ovarian ca,  surgery and chemotherapy    Social History   Social History  . Marital status: Widowed    Spouse name: N/A  . Number of children: 0  . Years of education: N/A   Occupational History  .  Retired   Social History Main Topics  . Smoking status: Former Smoker    Packs/day: 1.00    Years: 30.00    Types: Cigarettes    Quit date: 02/29/1996  . Smokeless tobacco: Never Used  . Alcohol use No  . Drug use: No  . Sexual activity: No   Other Topics Concern  . Not on file   Social History Narrative  . No narrative on file     PHYSICAL EXAMINATION  ECOG PERFORMANCE STATUS: 1 - Symptomatic but completely ambulatory  There were no vitals filed for this visit. There were no vitals filed for this visit.   Physical Exam  Constitutional: She is oriented to person, place, and time and well-developed, well-nourished, and in no distress.  Exam was performed in a treatment chair  HENT:  Head: Normocephalic and atraumatic.  Eyes: Conjunctivae and EOM are normal. Pupils are equal, round, and reactive to  light.  Neck: Normal range of motion. Neck supple.  Cardiovascular: Normal rate, regular rhythm and normal heart sounds.   Pulmonary/Chest: Effort normal and breath sounds normal.  Abdominal: Soft. Bowel sounds are normal.  Musculoskeletal: Normal range of motion.  Neurological: She is alert and oriented to person, place, and time. Gait normal.  Skin: Skin is warm and dry.  Nursing note and vitals reviewed.    LABORATORY DATA: CBC    Component Value Date/Time   WBC 6.5 06/07/2016 0949   RBC 3.40 (L) 06/07/2016 0949   HGB 10.7 (L) 06/07/2016 0949   HCT 33.7 (L) 06/07/2016 0949   PLT 168 06/07/2016 0949   MCV 99.1 06/07/2016 0949   MCH 31.5 06/07/2016 0949   MCHC 31.8 06/07/2016 0949   RDW 16.9 (H) 06/07/2016 0949   LYMPHSABS 1.1 06/07/2016 0949   MONOABS 0.7 06/07/2016 0949   EOSABS 0.0 06/07/2016 0949   BASOSABS 0.0 06/07/2016 0949      Chemistry      Component Value Date/Time   NA 135 06/07/2016 0949   K 5.0 06/07/2016 0949   CL 110 06/07/2016 0949   CO2 17 (L) 06/07/2016 0949   BUN 22 (H) 06/07/2016 0949   CREATININE 1.19 (H) 06/07/2016 0949      Component Value Date/Time   CALCIUM 9.1 06/07/2016 0949   ALKPHOS 110 06/07/2016 0949   AST 17 06/07/2016 0949   ALT 11 (L) 06/07/2016 0949   BILITOT 0.7 06/07/2016 0949     Results for NEAH, SPORRER (MRN [id number]) as of 06/21/2016 09:31  Ref. Range 04/25/2016 09:32 05/24/2016 09:21  CA 19-9 Latest Ref Range: 0 - 35 U/mL 526 (H) 973 (H)    PENDING LABS:   RADIOGRAPHIC STUDIES: I have personally  reviewed the radiological images as listed and agreed with the findings in the report. No results found.   CT CHEST, ABDOMEN, AND PELVIS WITH CONTRAST 05/09/2016  IMPRESSION: CT CHEST IMPRESSION  1. Mild progression of pulmonary metastasis. Although the majority of cavitary lung lesions are primarily similar, there are new and increased small cavitary nodules. 2. Trace residual loculated inferolateral right pleural  air. 3. No thoracic adenopathy. 4. Progression of anterior left fifth rib metastasis with soft tissue component surrounding.  CT ABDOMEN AND PELVIS IMPRESSION  1. Status post Whipple procedure, without locally recurrent disease. 2. Similar soft tissue fullness within the retroperitoneum. 3. Pneumobilia.  Possible mild cirrhosis. 4. Similar small bowel containing ventral abdominal wall laxity. 5. Similar abdominal aortic aneurysm.   PATHOLOGY:    ASSESSMENT AND PLAN:  Stage IV Pancreatic Adenocarcinoma Lung Adenocarcinoma RIJ thrombosis Transverse Sinus Thrombosis Chronic Anticoagulation with lovenox L pneumothorax, spontaneous Malignant Pleural Effusion, Talc Pleurodesis Preserved expression of major/minor MMR proteins done on 2017 LUL biopsy by Dr. Prescott Gum Dyspnea Anemia Macrocytosis B12 deficiency  PLAN:   Continue cycle 15 Liposomal trinotecan/5FU/Leucovorin today. Labs reviewed.  Discussed all of patient's complaints today related and her questions were answered to her satisfaction. Reviewed her CT scans from 04/2016 again with her since she was confused about what the results were.   Plan to restage her with scans in mid June.  RTC in 2 weeks for follow up and next cycle of chemo.   All questions were answered. The patient knows to call the clinic with any problems, questions or concerns. We can certainly see the patient much sooner if necessary.  This document serves as a record of services personally performed by Twana First, MD. It was created on her behalf by Shirlean Mylar, a trained medical scribe. The creation of this record is based on the scribe's personal observations and the provider's statements to them. This document has been checked and approved by the attending provider.  I have reviewed the above documentation for accuracy and completeness and I agree with the above.   This note is electronically signed by: Mikey College 06/21/2016 8:56  AM

## 2016-06-21 NOTE — Progress Notes (Signed)
Pt complained of rectal discomfort, no energy/fatigue, mouth is swollen and her gums feel full. Pt states that the potassium she takes is burning her stomach. Pt states that her eyes and nose are runny but it's clear.

## 2016-06-21 NOTE — Progress Notes (Signed)
[name] [name] tolerated chemo tx and Aranesp injection well without complaints or incident. Labs reviewed with Dr. Talbert Cage prior to administering chemotherapy. Potassium 5.3 so MD ordered for pt to hold her Potassium pill tomorrow and pt verbalized understanding. Hgb 9.3 so Aranesp injection given as ordered.VSS upon discharge. Pt discharged self ambulatory in satisfactory condition accompanied by her sister

## 2016-06-21 NOTE — Patient Instructions (Signed)
[facility] Discharge Instructions for Patients Receiving Chemotherapy   Beginning January 23rd 2017 lab work for the [facility] will be done in the  Main lab at [facility] on 1st floor. If you have a lab appointment with the [facility] please come in thru the  Main Entrance and check in at the main information desk   Today you received the following chemotherapy agents Irinotecan,Leucovorin and 5FU as well as Aranesp injection. Follow-up as scheduled. Call clinic for any questions or concerns  To help prevent nausea and vomiting after your treatment, we encourage you to take your nausea medication   If you develop nausea and vomiting, or diarrhea that is not controlled by your medication, call the clinic.  The clinic phone number is [phone]. Office hours are Monday-Friday 8:30am-5:00pm.  BELOW ARE SYMPTOMS THAT SHOULD BE REPORTED IMMEDIATELY:  *FEVER GREATER THAN 101.0 F  *CHILLS WITH OR WITHOUT FEVER  NAUSEA AND VOMITING THAT IS NOT CONTROLLED WITH YOUR NAUSEA MEDICATION  *UNUSUAL SHORTNESS OF BREATH  *UNUSUAL BRUISING OR BLEEDING  TENDERNESS IN MOUTH AND THROAT WITH OR WITHOUT PRESENCE OF ULCERS  *URINARY PROBLEMS  *BOWEL PROBLEMS  UNUSUAL RASH Items with * indicate a potential emergency and should be followed up as soon as possible. If you have an emergency after office hours please contact your primary care physician or go to the nearest emergency department.  Please call the clinic during office hours if you have any questions or concerns.   You may also contact the Patient Navigator at [phone] should you have any questions or need assistance in obtaining follow up care.      Resources For Cancer Patients and their Caregivers ? American Cancer Society: Can assist with transportation, wigs, general needs, runs Look Good Feel Better.        9417117601 ? Cancer Care: Provides financial assistance, online support groups,  medication/co-pay assistance.  1-800-813-HOPE 219 004 9318) ? [place] Assists [place] cancer patients and their families through emotional , educational and financial support.  403-569-4924 ? [place] DSS Where to apply for food stamps, Medicaid and utility assistance. [phone] ? [employer]: Transportation to medical appointments. 325-453-7472 ? Social Security Administration: May apply for disability if have a Stage IV cancer. 720-314-9428 5410243007 ? [employer], Disability and Transit Services: Assists with nutrition, care and transit needs. 8055736953

## 2016-06-21 NOTE — Patient Instructions (Addendum)
[place] at [facility] Discharge Instructions  RECOMMENDATIONS MADE BY THE CONSULTANT AND ANY TEST RESULTS WILL BE SENT TO YOUR REFERRING PHYSICIAN.  You were seen today by Dr. Talbert Cage. Return in 2 weeks for follow up.   Thank you for choosing [place] at [facility] to provide your oncology and hematology care.  To afford each patient quality time with our provider, please arrive at least 15 minutes before your scheduled appointment time.    If you have a lab appointment with the [facility] please come in thru the  Main Entrance and check in at the main information desk  You need to re-schedule your appointment should you arrive 10 or more minutes late.  We strive to give you quality time with our providers, and arriving late affects you and other patients whose appointments are after yours.  Also, if you no show three or more times for appointments you may be dismissed from the clinic at the providers discretion.     Again, thank you for choosing [facility].  Our hope is that these requests will decrease the amount of time that you wait before being seen by our physicians.       _____________________________________________________________  Should you have questions after your visit to [facility], please contact our office at [phone] between the hours of 8:30 a.m. and 4:30 p.m.  Voicemails left after 4:30 p.m. will not be returned until the following business day.  For prescription refill requests, have your pharmacy contact our office.       Resources For Cancer Patients and their Caregivers ? American Cancer Society: Can assist with transportation, wigs, general needs, runs Look Good Feel Better.        [phone] ? Cancer Care: Provides financial assistance, online support groups, medication/co-pay assistance.  1-800-813-HOPE 623-034-0442) ? [place] Assists [place]  cancer patients and their families through emotional , educational and financial support.  443-021-1628 ? [place] DSS Where to apply for food stamps, Medicaid and utility assistance. (870) 702-7770 ? [employer]: Transportation to medical appointments. 754-505-8583 ? Social Security Administration: May apply for disability if have a Stage IV cancer. 940-769-3397 602 743 4683 ? [employer], Disability and Transit Services: Assists with nutrition, care and transit needs. [place] Support Programs: '@10RELATIVEDAYS'$ @ > Cancer Support Group  2nd Tuesday of the month 1pm-2pm, Journey Room  > Creative Journey  3rd Tuesday of the month 1130am-1pm, Journey Room  > Look Good Feel Better  1st Wednesday of the month 10am-12 noon, Journey Room (Call [facility] to register [phone])

## 2016-06-22 LAB — CANCER ANTIGEN 19-9: CA 19-9: 492 U/mL — ABNORMAL HIGH (ref 0–35)

## 2016-06-23 ENCOUNTER — Encounter (HOSPITAL_COMMUNITY): Payer: Self-pay

## 2016-06-23 ENCOUNTER — Encounter (HOSPITAL_BASED_OUTPATIENT_CLINIC_OR_DEPARTMENT_OTHER): Payer: Medicare Other

## 2016-06-23 VITALS — BP 117/80 | HR 63 | Temp 98.1°F | Resp 18

## 2016-06-23 DIAGNOSIS — C7802 Secondary malignant neoplasm of left lung: Secondary | ICD-10-CM | POA: Diagnosis not present

## 2016-06-23 DIAGNOSIS — C259 Malignant neoplasm of pancreas, unspecified: Secondary | ICD-10-CM | POA: Diagnosis not present

## 2016-06-23 DIAGNOSIS — Z452 Encounter for adjustment and management of vascular access device: Secondary | ICD-10-CM

## 2016-06-23 DIAGNOSIS — C7800 Secondary malignant neoplasm of unspecified lung: Principal | ICD-10-CM

## 2016-06-23 MED ORDER — HEPARIN SOD (PORK) LOCK FLUSH 100 UNIT/ML IV SOLN
500.0000 [IU] | Freq: Once | INTRAVENOUS | Status: AC | PRN
  Administered 2016-06-23: 500 [IU]

## 2016-06-23 MED ORDER — SODIUM CHLORIDE 0.9% FLUSH
10.0000 mL | INTRAVENOUS | Status: DC | PRN
  Administered 2016-06-23: 10 mL
  Filled 2016-06-23: qty 10

## 2016-06-23 NOTE — Progress Notes (Signed)
[name] [name] presents to have home infusion pump d/c'd and for port-a-cath deaccess with flush.  Portacath located right chest wall accessed with  H 20 needle.  Good blood return present. Portacath flushed with NS and 500U/5ml Heparin, and needle removed intact.  Procedure tolerated well and without incident.  Discharged ambulatory.  

## 2016-06-23 NOTE — Patient Instructions (Signed)
[facility] at [facility] Discharge Instructions  RECOMMENDATIONS MADE BY THE CONSULTANT AND ANY TEST RESULTS WILL BE SENT TO YOUR REFERRING PHYSICIAN.  Pump removal and port flush today. Return as scheduled.  Thank you for choosing [facility] at [facility] to provide your oncology and hematology care.  To afford each patient quality time with our provider, please arrive at least 15 minutes before your scheduled appointment time.    If you have a lab appointment with the Cancer Center please come in thru the  Main Entrance and check in at the main information desk  You need to re-schedule your appointment should you arrive 10 or more minutes late.  We strive to give you quality time with our providers, and arriving late affects you and other patients whose appointments are after yours.  Also, if you no show three or more times for appointments you may be dismissed from the clinic at the providers discretion.     Again, thank you for choosing [facility].  Our hope is that these requests will decrease the amount of time that you wait before being seen by our physicians.       _____________________________________________________________  Should you have questions after your visit to  Cancer Center, please contact our office at [phone] between the hours of 8:30 a.m. and 4:30 p.m.  Voicemails left after 4:30 p.m. will not be returned until the following business day.  For prescription refill requests, have your pharmacy contact our office.       Resources For Cancer Patients and their Caregivers ? American Cancer Society: Can assist with transportation, wigs, general needs, runs Look Good Feel Better.        1-888-227-6333 ? Cancer Care: Provides financial assistance, online support groups, medication/co-pay assistance.  1-800-813-HOPE (4673) ? [facility] Assists [place] cancer  patients and their families through emotional , educational and financial support.  336-427-4357 ? [place] DSS Where to apply for food stamps, Medicaid and utility assistance. 336-342-1394 ? [employer]: Transportation to medical appointments. 336-347-2287 ? Social Security Administration: May apply for disability if have a Stage IV cancer. 336-342-7796 1-800-772-1213 ? [place] [employer], Disability and Transit Services: Assists with nutrition, care and transit needs. 336-349-2343  Cancer Center Support Programs: @10RELATIVEDAYS@ > Cancer Support Group  2nd Tuesday of the month 1pm-2pm, Journey Room  > Creative Journey  3rd Tuesday of the month 1130am-1pm, Journey Room  > Look Good Feel Better  1st Wednesday of the month 10am-12 noon, Journey Room (Call [employer] to register 1-800-395-5775)   

## 2016-07-05 ENCOUNTER — Encounter (HOSPITAL_COMMUNITY): Payer: Self-pay | Admitting: Oncology

## 2016-07-05 ENCOUNTER — Encounter (HOSPITAL_BASED_OUTPATIENT_CLINIC_OR_DEPARTMENT_OTHER): Payer: Medicare Other | Admitting: Oncology

## 2016-07-05 ENCOUNTER — Encounter (HOSPITAL_BASED_OUTPATIENT_CLINIC_OR_DEPARTMENT_OTHER): Payer: Medicare Other

## 2016-07-05 ENCOUNTER — Other Ambulatory Visit (HOSPITAL_COMMUNITY): Payer: Self-pay | Admitting: Oncology

## 2016-07-05 VITALS — BP 106/43 | HR 48 | Temp 97.8°F | Resp 18 | Wt 118.4 lb

## 2016-07-05 DIAGNOSIS — C259 Malignant neoplasm of pancreas, unspecified: Secondary | ICD-10-CM

## 2016-07-05 DIAGNOSIS — E538 Deficiency of other specified B group vitamins: Secondary | ICD-10-CM

## 2016-07-05 DIAGNOSIS — Z5111 Encounter for antineoplastic chemotherapy: Secondary | ICD-10-CM

## 2016-07-05 DIAGNOSIS — C7800 Secondary malignant neoplasm of unspecified lung: Principal | ICD-10-CM

## 2016-07-05 DIAGNOSIS — R519 Headache, unspecified: Secondary | ICD-10-CM

## 2016-07-05 DIAGNOSIS — C7802 Secondary malignant neoplasm of left lung: Secondary | ICD-10-CM | POA: Diagnosis not present

## 2016-07-05 DIAGNOSIS — Z7189 Other specified counseling: Secondary | ICD-10-CM

## 2016-07-05 DIAGNOSIS — Z95828 Presence of other vascular implants and grafts: Secondary | ICD-10-CM | POA: Diagnosis not present

## 2016-07-05 DIAGNOSIS — D6481 Anemia due to antineoplastic chemotherapy: Secondary | ICD-10-CM

## 2016-07-05 DIAGNOSIS — R197 Diarrhea, unspecified: Secondary | ICD-10-CM | POA: Diagnosis not present

## 2016-07-05 DIAGNOSIS — R51 Headache: Secondary | ICD-10-CM

## 2016-07-05 DIAGNOSIS — T451X5A Adverse effect of antineoplastic and immunosuppressive drugs, initial encounter: Secondary | ICD-10-CM

## 2016-07-05 HISTORY — DX: Anemia due to antineoplastic chemotherapy: D64.81

## 2016-07-05 LAB — COMPREHENSIVE METABOLIC PANEL
ALBUMIN: 3.2 g/dL — AB (ref 3.5–5.0)
ALK PHOS: 116 U/L (ref 38–126)
ALT: 12 U/L — AB (ref 14–54)
ANION GAP: 7 (ref 5–15)
AST: 18 U/L (ref 15–41)
BILIRUBIN TOTAL: 0.7 mg/dL (ref 0.3–1.2)
BUN: 18 mg/dL (ref 6–20)
CALCIUM: 8.5 mg/dL — AB (ref 8.9–10.3)
CO2: 19 mmol/L — AB (ref 22–32)
CREATININE: 1.11 mg/dL — AB (ref 0.44–1.00)
Chloride: 111 mmol/L (ref 101–111)
GFR calc Af Amer: 54 mL/min — ABNORMAL LOW (ref >60)
GFR calc non Af Amer: 46 mL/min — ABNORMAL LOW (ref >60)
GLUCOSE: 133 mg/dL — AB (ref 65–99)
Potassium: 3.7 mmol/L (ref 3.5–5.1)
SODIUM: 137 mmol/L (ref 135–145)
Total Protein: 6 g/dL — ABNORMAL LOW (ref 6.5–8.1)

## 2016-07-05 LAB — CBC WITH DIFFERENTIAL/PLATELET
Basophils Absolute: 0 10*3/uL (ref 0.0–0.1)
Basophils Relative: 0 %
EOS ABS: 0 10*3/uL (ref 0.0–0.7)
Eosinophils Relative: 1 %
HCT: 31.6 % — ABNORMAL LOW (ref 36.0–46.0)
HEMOGLOBIN: 10 g/dL — AB (ref 12.0–15.0)
LYMPHS ABS: 0.9 10*3/uL (ref 0.7–4.0)
LYMPHS PCT: 14 %
MCH: 31.2 pg (ref 26.0–34.0)
MCHC: 31.6 g/dL (ref 30.0–36.0)
MCV: 98.4 fL (ref 78.0–100.0)
MONOS PCT: 16 %
Monocytes Absolute: 1 10*3/uL (ref 0.1–1.0)
NEUTROS PCT: 69 %
Neutro Abs: 4.5 10*3/uL (ref 1.7–7.7)
Platelets: 124 10*3/uL — ABNORMAL LOW (ref 150–400)
RBC: 3.21 MIL/uL — AB (ref 3.87–5.11)
RDW: 18.6 % — ABNORMAL HIGH (ref 11.5–15.5)
WBC: 6.4 10*3/uL (ref 4.0–10.5)

## 2016-07-05 LAB — VITAMIN B12: Vitamin B-12: 391 pg/mL (ref 180–914)

## 2016-07-05 MED ORDER — IRINOTECAN HCL LIPOSOME CHEMO INJECTION 43 MG/10ML
35.0000 mg/m2 | INJECTION | Freq: Once | INTRAVENOUS | Status: AC
  Administered 2016-07-05: 55.9 mg via INTRAVENOUS
  Filled 2016-07-05: qty 13

## 2016-07-05 MED ORDER — SODIUM CHLORIDE 0.9% FLUSH
10.0000 mL | INTRAVENOUS | Status: DC | PRN
  Administered 2016-07-05: 10 mL
  Filled 2016-07-05: qty 10

## 2016-07-05 MED ORDER — SODIUM CHLORIDE 0.9 % IV SOLN
Freq: Once | INTRAVENOUS | Status: AC
  Administered 2016-07-05: 10:00:00 via INTRAVENOUS

## 2016-07-05 MED ORDER — PALONOSETRON HCL INJECTION 0.25 MG/5ML
0.2500 mg | Freq: Once | INTRAVENOUS | Status: AC
  Administered 2016-07-05: 0.25 mg via INTRAVENOUS
  Filled 2016-07-05: qty 5

## 2016-07-05 MED ORDER — SODIUM CHLORIDE 0.9 % IV SOLN
1500.0000 mg/m2 | INTRAVENOUS | Status: DC
  Administered 2016-07-05: 2450 mg via INTRAVENOUS
  Filled 2016-07-05: qty 49

## 2016-07-05 MED ORDER — PROCHLORPERAZINE MALEATE 10 MG PO TABS
10.0000 mg | ORAL_TABLET | Freq: Once | ORAL | Status: AC
  Administered 2016-07-05: 10 mg via ORAL
  Filled 2016-07-05: qty 1

## 2016-07-05 MED ORDER — LEUCOVORIN CALCIUM INJECTION 350 MG
400.0000 mg/m2 | Freq: Once | INTRAVENOUS | Status: AC
  Administered 2016-07-05: 652 mg via INTRAVENOUS
  Filled 2016-07-05: qty 32.6

## 2016-07-05 NOTE — Assessment & Plan Note (Addendum)
Anemia due to antineoplastic therapy.  On ESA therapy, Aranesp 500 mcg when indicated for a HGB < 10.1 g/dL.  Aranesp given on 06/21/2016.  She cannot have Aranesp today as a result.  When she returns for her next treatment, if her HGB is < 10.1 g/dL then she can get Aranesp.

## 2016-07-05 NOTE — Progress Notes (Signed)
[name] Blitz, MD [address] [place] [address]  Pancreatic cancer metastasized to lung [facility]) - Plan: CT Abdomen Pelvis W Contrast, CT Chest W Contrast, Cancer antigen 19-9, CT Head W Wo Contrast  Low serum vitamin B12 - Plan: Vitamin B12  Goals of care, counseling/discussion  Anemia due to antineoplastic chemotherapy  CURRENT THERAPY: Liposomal Irinotecan/5FU/Leucovorin  INTERVAL HISTORY: MACKAYLA MULLINS 76 y.o. female returns for followup of Stage IV pancreatic, currently on salvage/palliative liposomal irinotecan/5FU/Leucovorin AND H/O stage I BAC of lungs AND Recurrent pneumothorax requiring chest tube placement, followed/managed by Dr. Nils Pyle, resulting in [place] chemotherapy 03/01/2016- 04/2016.    Pancreatic cancer metastasized to lung [facility])   09/03/2008 Surgery    Whipple with Dr. Birdie Sons for T3N1 3/12 LN+ Pancreatic Adenocarcinoma      11/13/2008 - 09/12/2009 Chemotherapy    Adjuvant Gemcitabine X 6 months      08/31/2009 Surgery    Metastectomy with Dr. Arlyce Dice RUL 2 nodules, RLL (wedge resection X 2)      08/31/2009 Pathology Results    Metastatic adenocarcinoma TTF-1 negative      06/03/2010 Surgery    Left video-assisted thoracoscopic surgery, resection of left lower lobe lesion. with Dr. Arlyce Dice      06/04/2010 Pathology Results    adenocarcinoma positive for cytokeratin 7, focally positive for CDX2, with scattered staining for TTF1 cytokeratin 20. [facility] opinion felt to be lung primary although noted to stain for GI markers, not felt to be pancreatic primary. EGFR and ALK negative      03/31/2011 - 09/04/2011 Radiation Therapy    SBRT LUL 18Gy Dr. Tammi Klippel      03/15/2012 - 06/13/2012 Chemotherapy    5-FU based chemotherapy      02/13/2013 - 05/20/2013 Chemotherapy    Gemzar Abraxane for metastatic disease (given to cover both primaries at [facility])      07/15/2014 PET scan    Mild progression of pulm mets, hypermet mesenteric  tumor assoc with  SMA is stable to slightly increased in degree of FDG uptake      07/23/2014 Imaging         07/30/2014 - 09/10/2014 Chemotherapy    Gemzar abraxane stopped due to poor tolerance      04/06/2015 Procedure    L 20 French chest tube for spontaneous pneumothorax, by Dr. Tharon Aquas Trigt      04/20/2015 Pathology Results    High grade carcinoma c/w patient's known pancreatic primary (secondary opinion at [facility]) PDL-1 high expression, preserved major and minor MMR, MSI stable, Foundation ONE testing performed,       04/20/2015 Surgery    Left VATS, mini thoracotomy with stapling and oversewing of blebs, Talc pleurodesis. performed secondary to persistent air leak not improved with chest tube drainage      07/29/2015 Imaging    No signif change in appear of multifocal B cavitary pulmonary lesions, postop appearance upper abdomen, no findings of met disease to abd or pelvis,      09/29/2015 Imaging    Slight progression of multifocal cavitary pulmonary metastases, as discussed above. 2. Interval development of a lytic lesion in the antral lateral aspect of the left fifth rib where there is a nondisplaced pathologic fracture. No other definite osseous metastases are identified. 3. Prominent soft tissue adjacent to the proximal superior mesenteric artery and in the aortocaval nodal station, similar to prior examinations, likely to represent treated metastatic lymphadenopathy. 4. Epigastric ventral hernia  containing several short loops of small bowel. There is a focally dilated loop of small bowel adjacent to this, however, this is an isolated finding. No other dilatation of small bowel or colon to suggest frank bowel obstruction at this time.       10/19/2015 - 03/01/2016 Chemotherapy    The patient had palonosetron (ALOXI) injection 0.25 mg, 0.25 mg, Intravenous,  Once, 10 of 12 cycles  leucovorin 652 mg in dextrose 5 % 250 mL infusion, 400 mg/m2 = 652 mg,  Intravenous,  Once, 10 of 12 cycles  fluorouracil (ADRUCIL) 2,950 mg in sodium chloride 0.9 % 91 mL chemo infusion, 1,800 mg/m2 = 2,950 mg (100 % of original dose 1,800 mg/m2), Intravenous, 1 Day/Dose, 10 of 12 cycles Dose modification: 1,920 mg/m2 (original dose 1,800 mg/m2, Cycle 1, Reason: Provider Judgment), 1,800 mg/m2 (original dose 1,800 mg/m2, Cycle 1, Reason: Provider Judgment), 1,500 mg/m2 (original dose 1,800 mg/m2, Cycle 2, Reason: Dose not tolerated)  irinotecan LIPOSOME (ONIVYDE) 55.9 mg in sodium chloride 0.9 % 500 mL chemo infusion, 35 mg/m2 = 55.9 mg (100 % of original dose 35 mg/m2), Intravenous, Once, 10 of 12 cycles Dose modification: 35 mg/m2 (original dose 35 mg/m2, Cycle 1, Reason: Provider Judgment)  for chemotherapy treatment.        01/12/2016 Imaging    CT CAP- 1. Cavitary lung metastases are stable to decreased in size. 2. Expansile mixed lytic and sclerotic bone metastasis in the anterior left fifth rib is increased in sclerosis and mildly increased in size. 3. Infiltrative soft tissue in the retroperitoneum abutting the SMA is stable. 4. No new sites of metastatic disease in the chest, abdomen or pelvis.      03/05/2016 Procedure    Chest tube placed for spontaneous pneumothorax by Dr. Cyndia Bent      03/05/2016 - 03/10/2016 Hospital Admission    Admit date: 03/05/2016 Admission diagnosis: Spontenous pneumothorax Additional comments: chest tube placed by Dr. Cyndia Bent on 03/05/2016      03/14/2016 - 03/18/2016 Hospital Admission    Admit date: 03/14/2016 Admission diagnosis: Recurrent spontaneous pneumothorax Additional comments:       04/26/2016 -  Chemotherapy    The patient had palonosetron (ALOXI) injection 0.25 mg, 0.25 mg, Intravenous,  Once, 10 of 12 cycles  leucovorin 652 mg in dextrose 5 % 250 mL infusion, 400 mg/m2 = 652 mg, Intravenous,  Once, 10 of 12 cycles  fluorouracil (ADRUCIL) 2,950 mg in sodium chloride 0.9 % 91 mL chemo infusion, 1,800 mg/m2  = 2,950 mg (100 % of original dose 1,800 mg/m2), Intravenous, 1 Day/Dose, 10 of 12 cycles Dose modification: 1,920 mg/m2 (original dose 1,800 mg/m2, Cycle 1, Reason: Provider Judgment), 1,800 mg/m2 (original dose 1,800 mg/m2, Cycle 1, Reason: Provider Judgment), 1,500 mg/m2 (original dose 1,800 mg/m2, Cycle 2, Reason: Dose not tolerated)  irinotecan LIPOSOME (ONIVYDE) 55.9 mg in sodium chloride 0.9 % 500 mL chemo infusion, 35 mg/m2 = 55.9 mg (100 % of original dose 35 mg/m2), Intravenous, Once, 10 of 12 cycles Dose modification: 35 mg/m2 (original dose 35 mg/m2, Cycle 1, Reason: Provider Judgment)  for chemotherapy treatment.        05/09/2016 Imaging    CT CAP- CT CHEST IMPRESSION  1. Mild progression of pulmonary metastasis. Although the majority of cavitary lung lesions are primarily similar, there are new and increased small cavitary nodules. 2. Trace residual loculated inferolateral right pleural air. 3. No thoracic adenopathy. 4. Progression of anterior left fifth rib metastasis with soft tissue component surrounding.  CT  ABDOMEN AND PELVIS IMPRESSION  1. Status post Whipple procedure, without locally recurrent disease. 2. Similar soft tissue fullness within the retroperitoneum. 3. Pneumobilia. Possible mild cirrhosis. 4. Similar small bowel containing ventral abdominal wall laxity. 5. Similar abdominal aortic aneurysm.      05/09/2016 Progression    Progression of disease after being off therapy x 2 months.        HPI Elements Pancreatic Ca  Location: Pancreas  Quality: Adenocarcinoma  Severity: Stage IV  Duration: Dx in 08/2008  Context: S/P whipple with Dr. Eugenia Pancoast on 09/03/2008 followed by adjuvant Gemcitabine x 6 mo, then with metastectomy by Dr. Arlyce Dice in 08/2009, SBRT in 03/2011, ongoing systemic chemotherapy.  Timing:   Modifying Factors:   Associated Signs & Symptoms:     She reports an appetite of 75%.  Her weight is stable.  She rates her energy level at  50%.  She denies any pain.  She continues to have intermittent loose stools/diarrhea.  She is utilizing Imodium for this.  She reports a headache that has been ongoing for months.  She describes it as intermittent.  She notes that it is a frontal headache and deep to her eyes.  She denies any visual changes.  She denies any dizziness or loss of consciousness.  She denies any nausea or vomiting.  Review of Systems  Constitutional: Negative.  Negative for chills, fever and weight loss.  HENT: Negative.   Eyes: Negative.   Respiratory: Negative.  Negative for cough.   Cardiovascular: Negative.  Negative for chest pain.  Gastrointestinal: Positive for diarrhea. Negative for blood in stool, constipation, melena, nausea and vomiting.  Genitourinary: Negative.   Musculoskeletal: Negative.   Skin: Negative.   Neurological: Positive for headaches. Negative for weakness.  Endo/Heme/Allergies: Negative.   Psychiatric/Behavioral: Negative.     Past Medical History:  Diagnosis Date  . Abdominal wall hernia   . Anemia   . Anemia due to antineoplastic chemotherapy 07/05/2016  . Aortic aneurysm (HCC)    small aortic aneurysm  . Arthritis    "little in my right hand" (03/10/2016)  . Cervical cancer ([place]) 1963  . COPD (chronic obstructive pulmonary disease) ([place])   . Emphysema   . GERD (gastroesophageal reflux disease)   . Goals of care, counseling/discussion 02/02/2016  . Hypercholesterolemia   . Hypertension   . Low serum vitamin B12 01/20/2016  . Pancreatic adenocarcinoma [facility]) July 2010  . Pancreatic cancer metastasized to lung [facility]) 09/04/2015    Past Surgical History:  Procedure Laterality Date  . BREAST CYST EXCISION Right 1988  . BUNIONECTOMY Bilateral 2007  . CARDIAC CATHETERIZATION    . CATARACT EXTRACTION W/ INTRAOCULAR LENS  IMPLANT, BILATERAL Bilateral 2009  . CHOLECYSTECTOMY  July 2010   done w/ Whipple procedure  . IR GENERIC HISTORICAL  03/15/2016   IR GUIDED DRAIN W  CATHETER PLACEMENT 03/15/2016 Greggory Keen, MD [facility]-INTERV RAD  . SKIN CANCER EXCISION Left    nasal fold  . STAPLING OF BLEBS Left 04/20/2015   Procedure: STAPLING OF BLEBS;  Surgeon: Ivin Poot, MD;  Location: [place];  Service: Thoracic;  Laterality: Left;  . THYROID SURGERY     "had a growth taken off"  . TONSILLECTOMY  1943  . VAGINAL HYSTERECTOMY  1963   Cervical Cancer  . VIDEO ASSISTED THORACOSCOPY Left 04/20/2015   Procedure: VIDEO ASSISTED THORACOSCOPY;  Surgeon: Ivin Poot, MD;  Location: [place];  Service: Thoracic;  Laterality: Left;  . WHIPPLE PROCEDURE  July 2010  pancreatic adenocarcinoma    Family History  Problem Relation Age of Onset  . Pancreatic cancer Brother   . Colon cancer Sister 79       12/2006 dx surgery and chemotherapy  . Ovarian cancer Sister 40       Ovarian ca,  surgery and chemotherapy    Social History   Social History  . Marital status: Widowed    Spouse name: N/A  . Number of children: 0  . Years of education: N/A   Occupational History  .  Retired   Social History Main Topics  . Smoking status: Former Smoker    Packs/day: 1.00    Years: 30.00    Types: Cigarettes    Quit date: 02/29/1996  . Smokeless tobacco: Never Used  . Alcohol use No  . Drug use: No  . Sexual activity: No   Other Topics Concern  . None   Social History Narrative  . None     PHYSICAL EXAMINATION  ECOG PERFORMANCE STATUS: 1 - Symptomatic but completely ambulatory  There were no vitals filed for this visit.  Vitals - 1 value per visit 2/59/5638  SYSTOLIC 756  DIASTOLIC 60  Pulse 59  Temperature 97.8  Respirations 18  Weight (lb) 118.4    GENERAL:alert, no distress, well nourished, well developed, comfortable, cooperative, smiling and in chemo-recliner, accompanied by sister SKIN: skin color, texture, turgor are normal, no rashes or significant lesions HEAD: Normocephalic, No masses, lesions, tenderness or abnormalities EYES: normal, EOMI,  Conjunctiva are pink and non-injected EARS: External ears normal OROPHARYNX:lips, buccal mucosa, and tongue normal and mucous membranes are moist  NECK: supple, trachea midline LYMPH:  no palpable lymphadenopathy BREAST:not examined LUNGS: clear to auscultation  HEART: regular rate & rhythm ABDOMEN:abdomen soft and normal bowel sounds BACK: Back symmetric, no curvature. EXTREMITIES:less then 2 second capillary refill, no joint deformities, effusion, or inflammation, no skin discoloration, no cyanosis  NEURO: alert & oriented x 3 with fluent speech, no focal motor/sensory deficits   LABORATORY DATA: CBC    Component Value Date/Time   WBC 6.4 07/05/2016 0816   RBC 3.21 (L) 07/05/2016 0816   HGB 10.0 (L) 07/05/2016 0816   HCT 31.6 (L) 07/05/2016 0816   PLT 124 (L) 07/05/2016 0816   MCV 98.4 07/05/2016 0816   MCH 31.2 07/05/2016 0816   MCHC 31.6 07/05/2016 0816   RDW 18.6 (H) 07/05/2016 0816   LYMPHSABS 0.9 07/05/2016 0816   MONOABS 1.0 07/05/2016 0816   EOSABS 0.0 07/05/2016 0816   BASOSABS 0.0 07/05/2016 0816      Chemistry      Component Value Date/Time   NA 137 07/05/2016 0816   K 3.7 07/05/2016 0816   CL 111 07/05/2016 0816   CO2 19 (L) 07/05/2016 0816   BUN 18 07/05/2016 0816   CREATININE 1.11 (H) 07/05/2016 0816      Component Value Date/Time   CALCIUM 8.5 (L) 07/05/2016 0816   ALKPHOS 116 07/05/2016 0816   AST 18 07/05/2016 0816   ALT 12 (L) 07/05/2016 0816   BILITOT 0.7 07/05/2016 0816     Results for TASIA, LIZ (MRN [id number]) as of 07/05/2016 08:00  Ref. Range 06/21/2016 09:41  CA 19-9 Latest Ref Range: 0 - 35 U/mL 492 (H)   Lab Results  Component Value Date   VITAMINB12 184 01/19/2016    PENDING LABS:   RADIOGRAPHIC STUDIES:  No results found.   PATHOLOGY:    ASSESSMENT AND PLAN:  Pancreatic cancer metastasized  to lung [facility]) Stage IV pancreatic carcinoma, she is also reported to have a history of stage IV BAC of the lungs. (On  further review I feel she may have had stage I BAC of the lungs).  She is currently on  liposomal irinotecan/5-FU (dose reduced). AND Recurrent pneumothorax requiring chest tube placement, followed/managed by Dr. Prescott Gum, resulting in [place] chemotherapy from 03/01/2016- March 2018.  Oncology history is updated.  Pre-treatment labs today: CBC diff, CMET.  I personally reviewed and went over laboratory results with the patient.  The results are noted within this dictation.  Labs satisfy treatment parameters today.  Nursing, in accordance with chemotherapy administration protocol, will monitor for acute side effects/toxicities associated with chemotherapy administration today.  Labs in 2 weeks: CBC diff, CMET, CA 19-9.  Goals of care remain an ongoing discussion and the patient is very realistic about her goals and ongoing treatment.  She reports being prepared to transition to comfort care when treatment options are exhausted or her performance status declines.  Restaging scans are due in mid-June.  Orders are placed for CT CAP.  Due to her headaches, I will add CT head as well, although not a good as MRI imaging.  Return in 2 weeks for ongoing therapy and follow-up  Low serum vitamin B12 H/O low-normal vitamin B12.  S/P 4 weekly injections of B12.  Labs today: B12  Anemia due to antineoplastic chemotherapy Anemia due to antineoplastic therapy.  On ESA therapy, Aranesp 500 mcg when indicated for a HGB < 10.1 g/dL.  Aranesp given on 06/21/2016.  She cannot have Aranesp today as a result.  When she returns for her next treatment, if her HGB is < 10.1 g/dL then she can get Aranesp.   Number of Diagnoses or Treatment Options- Section A:      Problems to Exam Physician Problem(s) Number x Points= Results  Self-limited or minor (stable, improved, or worsening)  Max='2  1 1  ' Est. Problem (to examiner); stable, improved   1 2  Est. Problem (to examiner); worsening   2   New problem (to  examiner); no additional work-up planned  Max=1 3   New problem (to examiner); add. work-up planned   4 4     Total: 7    Amount and/or Complexity of Data to be Reviewed- Section B    Data to be reviewed: Points    Review and/or order of clinical lab tests 1  '[x]'    Review and/or order of tests in the radiology section of CPT (includes nuclear med & other except cardiac cath & ECG) 1  '[x]'    Review and/or order of tests in the medicine section of CPT (e.g. EKG, cardiac cath, non-invasive vascular studies, pulmonary function studies) 1  '[]'    Discussion of test results with performing physician 1  '[]'    Decision to obtain old records and/or obtaining history from someone other than patient 1  '[]'    Review and summarization of old records and/or obtaining history from someone other than patient and/or discussion of case with another health care provider 2  '[x]'    Independent visualization of image, tracing, or specimen itself (not simply review report) 2  '[]'    Total:   4    Risk of  complications and/or Morbidity or Mortality- Section C  Level of Risk: Presenting Problem(s) Diagnostic Procedure(s) Ordered Management Options Selected  Minimal One self-limited or minor problem (eg cold, insect bite, tinea corporis)  Lab test requiring venipuncture  Chest  xray  EKG/EEG  [place] or Echo  KOH prep  Urinalysis  Rest  Gargles  Elastic bandages  Superficial dressings  Low  Two or more self-limited or minor problems  One stable chronic illness (well-controlled HTN, non-insulin dependent diabetes, cataract, BPH)  Acute uncomplicated illness or injury (cystitis, allergic rhinitis, simple sprain)  Physiologic test not under stress (pulm fnx tests)  Non-cardiovascular imaging studies with contrast (barium enema)  Superficial needle biopsy  Clinical laboratory tests requiring arterial puncture  Skin biopsies  OTC drugs  Minor surgery with no identified risk factors  Physical  therapy  Occupational therapy  IV fluids without additives  Moderate  One or more chronic illnesses with mild exacerbation, progression, or side effects of treatment  Two or more stable chronic illnesses  Undiagnosed new problem with uncertain prognosis (lump in breast)  Acute illness with systemic symptoms (pyelonephritis, pneumonitis, colitis)  Acute complicated injury (head injury with brief loss of consciousness)  Physiologic test under stress (cardiac stress test, fetal contraction stress test)  Diagnostic endoscopies with no identified risk factors  Deep needle or incisional biopsy  Cardiovascular imaging studies with contrast and no identified risk factors (arteriogram, cardiac cath)  Obtain fluid from body cavity (LP, thoracentesis, culdocentesis)  Minor surgery with identified risk factors  Elective surgery (open, percutaneous, or endoscopic) with no identified risk factors  Prescription drug management  Therapeutic nuclear medicine  IV fluids with additives  Closed treatment of fracture of dislocation without manipulation  High  One or more chronic illnesses with severe exacerbation, progression, or side effects of treatment.  Acute or chronic illnesses or injuries that may pose a threat to life or bodily function (multiple trauma, acute MI, PE, severe respiratory distress, progressive severe rheumatoid arthritis, psychiatric illness with potential threat to self or others, peritonitis, acute renal failure)  An abrupt change in neurological status (seizure, TIA, weakness, sensory loss)  Cardiovascular imaging studies with contrast with identified risk factors  Cardiac electrophysiological tests  Diagnostic endoscopies with identified risk factors  Discography   Elective major surgery (open, percutaneous, or endoscopic) with identified risk factor  Emergency major surgery (open, percutaneous, or endoscopic)  Parental controlled substances  Drug therapy  requiring intensive monitoring for toxicity  Decision not to resuscitate or deescalate care because of poor prognosis     Final Result of Complexity      Choose decision making level with 2 or 3 checks OR choose the decision making level on Section B       A Number of diagnoses or treatment options  '[]'   </= 1 Minimal  '[]'   2 Limited  '[]'   3 Multiple  '[x]'   >/= 4 Extensive  B Amount and complexity of data  '[]'   </= 1 Minimal or low  '[]'   2 Limited  '[]'   3  Moderate  '[x]'   >/= 4 Extensive  C Highest risk  '[]'   Minimal  '[]'   Low  '[]'   Moderate  '[x]'   High   Type of decision making  '[]'   Straight-forward  '[]'   Low Complexity  '[]'   Moderate- Complexity  '[x]'   High- Complexity     ORDERS PLACED FOR THIS ENCOUNTER: Orders Placed This Encounter  Procedures  . CT Abdomen Pelvis W Contrast  . CT Chest W Contrast  . CT Head W Wo Contrast  . Cancer antigen 19-9  . Vitamin B12    MEDICATIONS PRESCRIBED THIS ENCOUNTER: No orders of the defined types were placed in this encounter.  THERAPY PLAN:  Continue with palliative chemotherapy.  All questions were answered. The patient knows to call the clinic with any problems, questions or concerns. We can certainly see the patient much sooner if necessary.  Patient and plan discussed with Dr. Twana First and she is in agreement with the aforementioned.   This note is electronically signed by: Doy Mince 07/05/2016 9:35 AM

## 2016-07-05 NOTE — Patient Instructions (Addendum)
[place] at [facility] Discharge Instructions  RECOMMENDATIONS MADE BY THE CONSULTANT AND ANY TEST RESULTS WILL BE SENT TO YOUR REFERRING PHYSICIAN.  You were seen today by Kirby Crigler PA-C. Treatment today. Return in 2 weeks for labs and treatment. CT scans in 3 weeks. Return in 4 weeks for labs, treatment and follow up.    Thank you for choosing [place] at [facility] to provide your oncology and hematology care.  To afford each patient quality time with our provider, please arrive at least 15 minutes before your scheduled appointment time.    If you have a lab appointment with the [facility] please come in thru the  Main Entrance and check in at the main information desk  You need to re-schedule your appointment should you arrive 10 or more minutes late.  We strive to give you quality time with our providers, and arriving late affects you and other patients whose appointments are after yours.  Also, if you no show three or more times for appointments you may be dismissed from the clinic at the providers discretion.     Again, thank you for choosing [facility].  Our hope is that these requests will decrease the amount of time that you wait before being seen by our physicians.       _____________________________________________________________  Should you have questions after your visit to [facility], please contact our office at [phone] between the hours of 8:30 a.m. and 4:30 p.m.  Voicemails left after 4:30 p.m. will not be returned until the following business day.  For prescription refill requests, have your pharmacy contact our office.       Resources For Cancer Patients and their Caregivers ? American Cancer Society: Can assist with transportation, wigs, general needs, runs Look Good Feel Better.        478-290-5462 ? Cancer Care: Provides financial assistance, online support groups,  medication/co-pay assistance.  1-800-813-HOPE 905-816-0046) ? [place] Assists [place] cancer patients and their families through emotional , educational and financial support.  539-857-1770 ? [place] DSS Where to apply for food stamps, Medicaid and utility assistance. 859-046-1673 ? [facility]: Transportation to medical appointments. (509)209-0042 ? Social Security Administration: May apply for disability if have a Stage IV cancer. (607)522-5380 316-766-2219 ? [employer], Disability and Transit Services: Assists with nutrition, care and transit needs. [place] Support Programs: '@10RELATIVEDAYS'$ @ > Cancer Support Group  2nd Tuesday of the month 1pm-2pm, Journey Room  > Creative Journey  3rd Tuesday of the month 1130am-1pm, Journey Room  > Look Good Feel Better  1st Wednesday of the month 10am-12 noon, Journey Room (Call [place] to register [phone])

## 2016-07-05 NOTE — Progress Notes (Signed)
[name] [name] tolerated chemo tx well without complaints or inicdent. Labs reviewed with Kirby Crigler PA-C prior to administering chemotherapy. VSS upon discharge. Pt discharged with 5FU pump infusing without issues. Pt discharged self ambulatory in satisfactory condition accompanied by her sister

## 2016-07-05 NOTE — Patient Instructions (Signed)
[facility] Discharge Instructions for Patients Receiving Chemotherapy   Beginning January 23rd 2017 lab work for the Cancer Center will be done in the  Main lab at [facility] on 1st floor. If you have a lab appointment with the Cancer Center please come in thru the  Main Entrance and check in at the main information desk   Today you received the following chemotherapy agents Irinotecan Liposome,Leucovorin and 5FU. Follow-up as scheduled. Call clinic for any questions or concerns  To help prevent nausea and vomiting after your treatment, we encourage you to take your nausea medication   If you develop nausea and vomiting, or diarrhea that is not controlled by your medication, call the clinic.  The clinic phone number is [phone]. Office hours are Monday-Friday 8:30am-5:00pm.  BELOW ARE SYMPTOMS THAT SHOULD BE REPORTED IMMEDIATELY:  *FEVER GREATER THAN 101.0 F  *CHILLS WITH OR WITHOUT FEVER  NAUSEA AND VOMITING THAT IS NOT CONTROLLED WITH YOUR NAUSEA MEDICATION  *UNUSUAL SHORTNESS OF BREATH  *UNUSUAL BRUISING OR BLEEDING  TENDERNESS IN MOUTH AND THROAT WITH OR WITHOUT PRESENCE OF ULCERS  *URINARY PROBLEMS  *BOWEL PROBLEMS  UNUSUAL RASH Items with * indicate a potential emergency and should be followed up as soon as possible. If you have an emergency after office hours please contact your primary care physician or go to the nearest emergency department.  Please call the clinic during office hours if you have any questions or concerns.   You may also contact the Patient Navigator at [phone] should you have any questions or need assistance in obtaining follow up care.      Resources For Cancer Patients and their Caregivers ? American Cancer Society: Can assist with transportation, wigs, general needs, runs Look Good Feel Better.        1-888-227-6333 ? Cancer Care: Provides financial assistance, online support groups, medication/co-pay  assistance.  1-800-813-HOPE (4673) ? [facility] Assists [place] cancer patients and their families through emotional , educational and financial support.  336-427-4357 ? [place] DSS Where to apply for food stamps, Medicaid and utility assistance. 336-342-1394 ? [facility]: Transportation to medical appointments. 336-347-2287 ? Social Security Administration: May apply for disability if have a Stage IV cancer. 336-342-7796 1-800-772-1213 ? [place] [facility]ging, Disability and Transit Services: Assists with nutrition, care and transit needs. 336-349-2343         

## 2016-07-05 NOTE — Assessment & Plan Note (Addendum)
Stage IV pancreatic carcinoma, she is also reported to have a history of stage IV BAC of the lungs. (On further review I feel she may have had stage I BAC of the lungs).  She is currently on  liposomal irinotecan/5-FU (dose reduced). AND Recurrent pneumothorax requiring chest tube placement, followed/managed by Dr. Prescott Gum, resulting in [place] chemotherapy from 03/01/2016- March 2018.  Oncology history is updated.  Pre-treatment labs today: CBC diff, CMET.  I personally reviewed and went over laboratory results with the patient.  The results are noted within this dictation.  Labs satisfy treatment parameters today.  Nursing, in accordance with chemotherapy administration protocol, will monitor for acute side effects/toxicities associated with chemotherapy administration today.  Labs in 2 weeks: CBC diff, CMET, CA 19-9.  Goals of care remain an ongoing discussion and the patient is very realistic about her goals and ongoing treatment.  She reports being prepared to transition to comfort care when treatment options are exhausted or her performance status declines.  Restaging scans are due in mid-June.  Orders are placed for CT CAP.  Due to her headaches, I will add CT head as well, although not a good as MRI imaging.  Return in 2 weeks for ongoing therapy and follow-up

## 2016-07-05 NOTE — Assessment & Plan Note (Signed)
H/O low-normal vitamin B12.  S/P 4 weekly injections of B12.  Labs today: B12

## 2016-07-07 ENCOUNTER — Encounter (HOSPITAL_COMMUNITY): Payer: Self-pay

## 2016-07-07 ENCOUNTER — Encounter (HOSPITAL_BASED_OUTPATIENT_CLINIC_OR_DEPARTMENT_OTHER): Payer: Medicare Other

## 2016-07-07 VITALS — BP 122/87 | HR 59 | Temp 97.9°F | Resp 18

## 2016-07-07 DIAGNOSIS — C7802 Secondary malignant neoplasm of left lung: Secondary | ICD-10-CM | POA: Diagnosis not present

## 2016-07-07 DIAGNOSIS — C7800 Secondary malignant neoplasm of unspecified lung: Principal | ICD-10-CM

## 2016-07-07 DIAGNOSIS — C259 Malignant neoplasm of pancreas, unspecified: Secondary | ICD-10-CM | POA: Diagnosis not present

## 2016-07-07 MED ORDER — HEPARIN SOD (PORK) LOCK FLUSH 100 UNIT/ML IV SOLN
INTRAVENOUS | Status: AC
  Filled 2016-07-07: qty 5

## 2016-07-07 MED ORDER — SODIUM CHLORIDE 0.9% FLUSH
10.0000 mL | INTRAVENOUS | Status: DC | PRN
  Administered 2016-07-07: 10 mL
  Filled 2016-07-07: qty 10

## 2016-07-07 MED ORDER — HEPARIN SOD (PORK) LOCK FLUSH 100 UNIT/ML IV SOLN
500.0000 [IU] | Freq: Once | INTRAVENOUS | Status: AC | PRN
  Administered 2016-07-07: 500 [IU]

## 2016-07-07 NOTE — Patient Instructions (Signed)
[facility] Discharge Instructions  RECOMMENDATIONS MADE BY THE CONSULTANT AND ANY TEST RESULTS WILL BE SENT TO YOUR REFERRING PHYSICIAN.  Pump removal and port flush today. Return as scheduled.  Thank you for choosing [facility] at [facility] to provide your oncology and hematology care.  To afford each patient quality time with our provider, please arrive at least 15 minutes before your scheduled appointment time.    If you have a lab appointment with the Cancer Center please come in thru the  Main Entrance and check in at the main information desk  You need to re-schedule your appointment should you arrive 10 or more minutes late.  We strive to give you quality time with our providers, and arriving late affects you and other patients whose appointments are after yours.  Also, if you no show three or more times for appointments you may be dismissed from the clinic at the providers discretion.     Again, thank you for choosing [facility].  Our hope is that these requests will decrease the amount of time that you wait before being seen by our physicians.       _____________________________________________________________  Should you have questions after your visit to [facility], please contact our office at [phone] between the hours of 8:30 a.m. and 4:30 p.m.  Voicemails left after 4:30 p.m. will not be returned until the following business day.  For prescription refill requests, have your pharmacy contact our office.       Resources For Cancer Patients and their Caregivers ? American Cancer Society: Can assist with transportation, wigs, general needs, runs Look Good Feel Better.        1-888-227-6333 ? Cancer Care: Provides financial assistance, online support groups, medication/co-pay assistance.  1-800-813-HOPE (4673) ? [facility] Assists [place] cancer  patients and their families through emotional , educational and financial support.  336-427-4357 ? [place] DSS Where to apply for food stamps, Medicaid and utility assistance. 336-342-1394 ? [facility]: Transportation to medical appointments. 336-347-2287 ? Social Security Administration: May apply for disability if have a Stage IV cancer. 336-342-7796 1-800-772-1213 ? [place] [facility]ging, Disability and Transit Services: Assists with nutrition, care and transit needs. 336-349-2343  Cancer Center Support Programs: [email or web address]@ > Cancer Support Group  2nd Tuesday of the month 1pm-2pm, Journey Room  > Creative Journey  3rd Tuesday of the month 1130am-1pm, Journey Room  > Look Good Feel Better  1st Wednesday of the month 10am-12 noon, Journey Room (Call [employer] to register 1-800-395-5775)   

## 2016-07-07 NOTE — Progress Notes (Signed)
[name] [name] presents to have home infusion pump d/c'd and for port-a-cath deaccess with flush.  Portacath located right chest wall accessed with  H 20 needle.  Good blood return present. Portacath flushed with NS and 500U/5ml Heparin, and needle removed intact.  Procedure tolerated well and without incident.  Discharged ambulatory.  

## 2016-07-19 ENCOUNTER — Ambulatory Visit (HOSPITAL_COMMUNITY): Payer: Medicare Other

## 2016-07-19 ENCOUNTER — Ambulatory Visit (HOSPITAL_COMMUNITY): Payer: Medicare Other | Admitting: Adult Health

## 2016-07-19 ENCOUNTER — Encounter (HOSPITAL_COMMUNITY): Payer: Self-pay

## 2016-07-19 ENCOUNTER — Encounter (HOSPITAL_BASED_OUTPATIENT_CLINIC_OR_DEPARTMENT_OTHER): Payer: Medicare Other

## 2016-07-19 ENCOUNTER — Other Ambulatory Visit: Payer: Self-pay | Admitting: Cardiothoracic Surgery

## 2016-07-19 ENCOUNTER — Encounter (HOSPITAL_BASED_OUTPATIENT_CLINIC_OR_DEPARTMENT_OTHER): Payer: Medicare Other | Admitting: Adult Health

## 2016-07-19 VITALS — BP 117/49 | HR 54 | Temp 97.7°F | Resp 18 | Wt 117.6 lb

## 2016-07-19 DIAGNOSIS — C7800 Secondary malignant neoplasm of unspecified lung: Principal | ICD-10-CM

## 2016-07-19 DIAGNOSIS — D6481 Anemia due to antineoplastic chemotherapy: Secondary | ICD-10-CM

## 2016-07-19 DIAGNOSIS — C259 Malignant neoplasm of pancreas, unspecified: Secondary | ICD-10-CM

## 2016-07-19 DIAGNOSIS — T451X5A Adverse effect of antineoplastic and immunosuppressive drugs, initial encounter: Secondary | ICD-10-CM

## 2016-07-19 DIAGNOSIS — K521 Toxic gastroenteritis and colitis: Secondary | ICD-10-CM | POA: Diagnosis not present

## 2016-07-19 DIAGNOSIS — E538 Deficiency of other specified B group vitamins: Secondary | ICD-10-CM

## 2016-07-19 DIAGNOSIS — Z5111 Encounter for antineoplastic chemotherapy: Secondary | ICD-10-CM

## 2016-07-19 DIAGNOSIS — C7802 Secondary malignant neoplasm of left lung: Secondary | ICD-10-CM

## 2016-07-19 DIAGNOSIS — C3490 Malignant neoplasm of unspecified part of unspecified bronchus or lung: Secondary | ICD-10-CM

## 2016-07-19 DIAGNOSIS — R197 Diarrhea, unspecified: Secondary | ICD-10-CM | POA: Diagnosis not present

## 2016-07-19 DIAGNOSIS — Z7189 Other specified counseling: Secondary | ICD-10-CM

## 2016-07-19 DIAGNOSIS — R51 Headache: Secondary | ICD-10-CM

## 2016-07-19 DIAGNOSIS — Z95828 Presence of other vascular implants and grafts: Secondary | ICD-10-CM | POA: Diagnosis not present

## 2016-07-19 LAB — CBC WITH DIFFERENTIAL/PLATELET
BASOS PCT: 0 %
Basophils Absolute: 0 10*3/uL (ref 0.0–0.1)
EOS ABS: 0 10*3/uL (ref 0.0–0.7)
Eosinophils Relative: 1 %
HCT: 30.5 % — ABNORMAL LOW (ref 36.0–46.0)
Hemoglobin: 9.7 g/dL — ABNORMAL LOW (ref 12.0–15.0)
Lymphocytes Relative: 19 %
Lymphs Abs: 1.2 10*3/uL (ref 0.7–4.0)
MCH: 30.9 pg (ref 26.0–34.0)
MCHC: 31.8 g/dL (ref 30.0–36.0)
MCV: 97.1 fL (ref 78.0–100.0)
MONO ABS: 1 10*3/uL (ref 0.1–1.0)
MONOS PCT: 16 %
NEUTROS PCT: 64 %
Neutro Abs: 4.1 10*3/uL (ref 1.7–7.7)
Platelets: 152 10*3/uL (ref 150–400)
RBC: 3.14 MIL/uL — ABNORMAL LOW (ref 3.87–5.11)
RDW: 16.7 % — AB (ref 11.5–15.5)
WBC: 6.3 10*3/uL (ref 4.0–10.5)

## 2016-07-19 LAB — COMPREHENSIVE METABOLIC PANEL
ALT: 10 U/L — ABNORMAL LOW (ref 14–54)
ANION GAP: 8 (ref 5–15)
AST: 15 U/L (ref 15–41)
Albumin: 3.2 g/dL — ABNORMAL LOW (ref 3.5–5.0)
Alkaline Phosphatase: 101 U/L (ref 38–126)
BUN: 22 mg/dL — ABNORMAL HIGH (ref 6–20)
CO2: 20 mmol/L — AB (ref 22–32)
Calcium: 8.8 mg/dL — ABNORMAL LOW (ref 8.9–10.3)
Chloride: 109 mmol/L (ref 101–111)
Creatinine, Ser: 1.05 mg/dL — ABNORMAL HIGH (ref 0.44–1.00)
GFR calc Af Amer: 57 mL/min — ABNORMAL LOW (ref >60)
GFR calc non Af Amer: 50 mL/min — ABNORMAL LOW (ref >60)
GLUCOSE: 95 mg/dL (ref 65–99)
POTASSIUM: 4.2 mmol/L (ref 3.5–5.1)
SODIUM: 137 mmol/L (ref 135–145)
Total Bilirubin: 0.6 mg/dL (ref 0.3–1.2)
Total Protein: 6.4 g/dL — ABNORMAL LOW (ref 6.5–8.1)

## 2016-07-19 MED ORDER — DARBEPOETIN ALFA 500 MCG/ML IJ SOSY
500.0000 ug | PREFILLED_SYRINGE | Freq: Once | INTRAMUSCULAR | Status: AC
  Administered 2016-07-19: 500 ug via SUBCUTANEOUS
  Filled 2016-07-19: qty 1

## 2016-07-19 MED ORDER — SODIUM CHLORIDE 0.9% FLUSH
10.0000 mL | INTRAVENOUS | Status: DC | PRN
  Administered 2016-07-19: 10 mL
  Filled 2016-07-19: qty 10

## 2016-07-19 MED ORDER — FLUOROURACIL CHEMO INJECTION 5 GM/100ML
1500.0000 mg/m2 | INTRAVENOUS | Status: DC
  Administered 2016-07-19: 2450 mg via INTRAVENOUS
  Filled 2016-07-19: qty 49

## 2016-07-19 MED ORDER — DEXTROSE 5 % IV SOLN
400.0000 mg/m2 | Freq: Once | INTRAVENOUS | Status: AC
  Administered 2016-07-19: 652 mg via INTRAVENOUS
  Filled 2016-07-19: qty 32.6

## 2016-07-19 MED ORDER — SODIUM CHLORIDE 0.9 % IV SOLN
35.0000 mg/m2 | Freq: Once | INTRAVENOUS | Status: AC
  Administered 2016-07-19: 55.9 mg via INTRAVENOUS
  Filled 2016-07-19: qty 13

## 2016-07-19 MED ORDER — SODIUM CHLORIDE 0.9 % IV SOLN
Freq: Once | INTRAVENOUS | Status: AC
  Administered 2016-07-19: 11:00:00 via INTRAVENOUS

## 2016-07-19 MED ORDER — PALONOSETRON HCL INJECTION 0.25 MG/5ML
0.2500 mg | Freq: Once | INTRAVENOUS | Status: AC
  Administered 2016-07-19: 0.25 mg via INTRAVENOUS
  Filled 2016-07-19: qty 5

## 2016-07-19 MED ORDER — PROCHLORPERAZINE MALEATE 10 MG PO TABS
10.0000 mg | ORAL_TABLET | Freq: Once | ORAL | Status: AC
  Administered 2016-07-19: 10 mg via ORAL
  Filled 2016-07-19: qty 1

## 2016-07-19 NOTE — Progress Notes (Signed)
Labs reviewed with MD, proceed with treatment.  [name] [name] presents today for injection per MD orders. Aranesp 525mcg administered SQ in  Abdomen. Administration without incident. Patient tolerated well.   Chemotherapy given today per orders. Patient tolerated it well, vitals stable and discharged home from clinic ambulatory.Connected patient to continuous 5FU pump today. Follow up as scheduled.

## 2016-07-19 NOTE — Progress Notes (Signed)
 [facility] [address] [place], [place] [address]   CLINIC:  Medical Oncology/Hematology  PCP:  Shah, Ashish, MD [address] [place] [address] [phone]   REASON FOR VISIT:  Follow-up for Stage IV adenocarcinoma of pancreas with lung mets   CURRENT THERAPY: 5-FU/Leucovorin/Liposomal Irinotecan, resumed 04/26/16 (palliative treatment)   BRIEF ONCOLOGIC HISTORY:    Pancreatic cancer metastasized to lung (HCC)   09/03/2008 Surgery    Whipple with Dr. Russell Howerton for T3N1 3/12 LN+ Pancreatic Adenocarcinoma      11/13/2008 - 09/12/2009 Chemotherapy    Adjuvant Gemcitabine X 6 months      08/31/2009 Surgery    Metastectomy with Dr. Burney RUL 2 nodules, RLL (wedge resection X 2)      08/31/2009 Pathology Results    Metastatic adenocarcinoma TTF-1 negative      06/03/2010 Surgery    Left video-assisted thoracoscopic surgery, resection of left lower lobe lesion. with Dr. Burney      06/04/2010 Pathology Results    adenocarcinoma positive for cytokeratin 7, focally positive for CDX2, with scattered staining for TTF1 cytokeratin 20. [facility] opinion felt to be lung primary although noted to stain for GI markers, not felt to be pancreatic primary. EGFR and ALK negative      03/31/2011 - 09/04/2011 Radiation Therapy    SBRT LUL 18Gy Dr. Manning      03/15/2012 - 06/13/2012 Chemotherapy    5-FU based chemotherapy      02/13/2013 - 05/20/2013 Chemotherapy    Gemzar Abraxane for metastatic disease (given to cover both primaries at [facility])      07/15/2014 PET scan    Mild progression of pulm mets, hypermet mesenteric tumor assoc with  SMA is stable to slightly increased in degree of FDG uptake      07/23/2014 Imaging         07/30/2014 - 09/10/2014 Chemotherapy    Gemzar abraxane stopped due to poor tolerance      04/06/2015 Procedure    L 20 French chest tube for spontaneous pneumothorax, by Dr. Peter Van Trigt      04/20/2015 Pathology Results   High grade carcinoma c/w patient's known pancreatic primary (secondary opinion at [facility]) PDL-1 high expression, preserved major and minor MMR, MSI stable, Foundation ONE testing performed,       04/20/2015 Surgery    Left VATS, mini thoracotomy with stapling and oversewing of blebs, Talc pleurodesis. performed secondary to persistent air leak not improved with chest tube drainage      07/29/2015 Imaging    No signif change in appear of multifocal B cavitary pulmonary lesions, postop appearance upper abdomen, no findings of met disease to abd or pelvis,      09/29/2015 Imaging    Slight progression of multifocal cavitary pulmonary metastases, as discussed above. 2. Interval development of a lytic lesion in the antral lateral aspect of the left fifth rib where there is a nondisplaced pathologic fracture. No other definite osseous metastases are identified. 3. Prominent soft tissue adjacent to the proximal superior mesenteric artery and in the aortocaval nodal station, similar to prior examinations, likely to represent treated metastatic lymphadenopathy. 4. Epigastric ventral hernia containing several short loops of small bowel. There is a focally dilated loop of small bowel adjacent to this, however, this is an isolated finding. No other dilatation of small bowel or colon to suggest frank bowel obstruction at this time.       10/19/2015 - 03/01/2016 Chemotherapy      The patient had palonosetron (ALOXI) injection 0.25 mg, 0.25 mg, Intravenous,  Once, 10 of 12 cycles  leucovorin 652 mg in dextrose 5 % 250 mL infusion, 400 mg/m2 = 652 mg, Intravenous,  Once, 10 of 12 cycles  fluorouracil (ADRUCIL) 2,950 mg in sodium chloride 0.9 % 91 mL chemo infusion, 1,800 mg/m2 = 2,950 mg (100 % of original dose 1,800 mg/m2), Intravenous, 1 Day/Dose, 10 of 12 cycles Dose modification: 1,920 mg/m2 (original dose 1,800 mg/m2, Cycle 1, Reason: Provider Judgment), 1,800 mg/m2 (original dose  1,800 mg/m2, Cycle 1, Reason: Provider Judgment), 1,500 mg/m2 (original dose 1,800 mg/m2, Cycle 2, Reason: Dose not tolerated)  irinotecan LIPOSOME (ONIVYDE) 55.9 mg in sodium chloride 0.9 % 500 mL chemo infusion, 35 mg/m2 = 55.9 mg (100 % of original dose 35 mg/m2), Intravenous, Once, 10 of 12 cycles Dose modification: 35 mg/m2 (original dose 35 mg/m2, Cycle 1, Reason: Provider Judgment)  for chemotherapy treatment.        01/12/2016 Imaging    CT CAP- 1. Cavitary lung metastases are stable to decreased in size. 2. Expansile mixed lytic and sclerotic bone metastasis in the anterior left fifth rib is increased in sclerosis and mildly increased in size. 3. Infiltrative soft tissue in the retroperitoneum abutting the SMA is stable. 4. No new sites of metastatic disease in the chest, abdomen or pelvis.      03/05/2016 Procedure    Chest tube placed for spontaneous pneumothorax by Dr. Cyndia Bent      03/05/2016 - 03/10/2016 Hospital Admission    Admit date: 03/05/2016 Admission diagnosis: Spontenous pneumothorax Additional comments: chest tube placed by Dr. Cyndia Bent on 03/05/2016      03/14/2016 - 03/18/2016 Hospital Admission    Admit date: 03/14/2016 Admission diagnosis: Recurrent spontaneous pneumothorax Additional comments:       04/26/2016 -  Chemotherapy    The patient had palonosetron (ALOXI) injection 0.25 mg, 0.25 mg, Intravenous,  Once, 10 of 12 cycles  leucovorin 652 mg in dextrose 5 % 250 mL infusion, 400 mg/m2 = 652 mg, Intravenous,  Once, 10 of 12 cycles  fluorouracil (ADRUCIL) 2,950 mg in sodium chloride 0.9 % 91 mL chemo infusion, 1,800 mg/m2 = 2,950 mg (100 % of original dose 1,800 mg/m2), Intravenous, 1 Day/Dose, 10 of 12 cycles Dose modification: 1,920 mg/m2 (original dose 1,800 mg/m2, Cycle 1, Reason: Provider Judgment), 1,800 mg/m2 (original dose 1,800 mg/m2, Cycle 1, Reason: Provider Judgment), 1,500 mg/m2 (original dose 1,800 mg/m2, Cycle 2, Reason: Dose not  tolerated)  irinotecan LIPOSOME (ONIVYDE) 55.9 mg in sodium chloride 0.9 % 500 mL chemo infusion, 35 mg/m2 = 55.9 mg (100 % of original dose 35 mg/m2), Intravenous, Once, 10 of 12 cycles Dose modification: 35 mg/m2 (original dose 35 mg/m2, Cycle 1, Reason: Provider Judgment)  for chemotherapy treatment.        05/09/2016 Imaging    CT CAP- CT CHEST IMPRESSION  1. Mild progression of pulmonary metastasis. Although the majority of cavitary lung lesions are primarily similar, there are new and increased small cavitary nodules. 2. Trace residual loculated inferolateral right pleural air. 3. No thoracic adenopathy. 4. Progression of anterior left fifth rib metastasis with soft tissue component surrounding.  CT ABDOMEN AND PELVIS IMPRESSION  1. Status post Whipple procedure, without locally recurrent disease. 2. Similar soft tissue fullness within the retroperitoneum. 3. Pneumobilia. Possible mild cirrhosis. 4. Similar small bowel containing ventral abdominal wall laxity. 5. Similar abdominal aortic aneurysm.      05/09/2016 Progression    Progression of disease  after being off therapy x 2 months.        INTERVAL HISTORY:  Ms. Silveria 76 y.o. female returns to cancer center today for routine follow-up and consideration for next cycle of chemotherapy with 5-FU/Leucovorin/Liposomal Irinotecan.    She tells me that overall she feels well.  "This past week has been a great week."  Reports that her appetite and energy levels are both 75%.  Weight is stable. Denies any pain today.  She tells me she is due to see Dr. Pres[place] Gum with cardio-thoracic surgery tomorrow, 07/20/16, given her history of recurrent pneumothorax requiring chest tube placement in 02/2016.   Continues to have intermittent loose stools. Denies any blood in her stools, although does endorse struggling with hemorrhoids occasionally that cause some bleeding periodically.  She takes Imodium PRN and it remains helpful in  controlling the diarrhea.  Continues to have headaches "behind my eyes" for the past 2-3 months, which is slightly improved today but still intermittently present for her.  She admittedly is "worried about brain cancer."    She would like me to look at her feet today. Reports that she has had worsening "broken capillaries on my feet" in recent weeks. She would like our recommendations if she should see a podiatrist or not for it. Denies any pain to the feet.  Overall, she feels ready for her next cycle of chemotherapy today.   REVIEW OF SYSTEMS:  Review of Systems  Constitutional: Negative.  Negative for chills, fatigue and fever.  HENT:  Negative.  Negative for lump/mass and nosebleeds.   Eyes: Negative.   Respiratory: Negative.  Negative for cough and shortness of breath.   Cardiovascular: Negative.  Negative for chest pain and leg swelling.  Gastrointestinal: Positive for diarrhea. Negative for abdominal pain, blood in stool, constipation, nausea and vomiting.  Endocrine: Negative.   Genitourinary: Negative.  Negative for dysuria and hematuria.   Musculoskeletal: Negative.  Negative for arthralgias.  Skin: Negative.  Negative for rash.  Neurological: Positive for headaches. Negative for dizziness.  Hematological: Negative.  Negative for adenopathy. Does not bruise/bleed easily.  Psychiatric/Behavioral: Negative.  Negative for depression and sleep disturbance. The patient is not nervous/anxious.      PAST MEDICAL/SURGICAL HISTORY:  Past Medical History:  Diagnosis Date  . Abdominal wall hernia   . Anemia   . Anemia due to antineoplastic chemotherapy 07/05/2016  . Aortic aneurysm (HCC)    small aortic aneurysm  . Arthritis    "little in my right hand" (03/10/2016)  . Cervical cancer ([place]) 1963  . COPD (chronic obstructive pulmonary disease) ([place])   . Emphysema   . GERD (gastroesophageal reflux disease)   . Goals of care, counseling/discussion 02/02/2016  . Hypercholesterolemia    . Hypertension   . Low serum vitamin B12 01/20/2016  . Pancreatic adenocarcinoma [facility]) July 2010  . Pancreatic cancer metastasized to lung [facility]) 09/04/2015   Past Surgical History:  Procedure Laterality Date  . BREAST CYST EXCISION Right 1988  . BUNIONECTOMY Bilateral 2007  . CARDIAC CATHETERIZATION    . CATARACT EXTRACTION W/ INTRAOCULAR LENS  IMPLANT, BILATERAL Bilateral 2009  . CHOLECYSTECTOMY  July 2010   done w/ Whipple procedure  . IR GENERIC HISTORICAL  03/15/2016   IR GUIDED DRAIN W CATHETER PLACEMENT 03/15/2016 Greggory Keen, MD [facility]-INTERV RAD  . SKIN CANCER EXCISION Left    nasal fold  . STAPLING OF BLEBS Left 04/20/2015   Procedure: STAPLING OF BLEBS;  Surgeon: Ivin Poot, MD;  Location:  [facility] OR;  Service: Thoracic;  Laterality: Left;  . THYROID SURGERY     "had a growth taken off"  . TONSILLECTOMY  1943  . VAGINAL HYSTERECTOMY  1963   Cervical Cancer  . VIDEO ASSISTED THORACOSCOPY Left 04/20/2015   Procedure: VIDEO ASSISTED THORACOSCOPY;  Surgeon: Ivin Poot, MD;  Location: [place];  Service: Thoracic;  Laterality: Left;  . WHIPPLE PROCEDURE  July 2010   pancreatic adenocarcinoma     SOCIAL HISTORY:  Social History   Social History  . Marital status: Widowed    Spouse name: N/A  . Number of children: 0  . Years of education: N/A   Occupational History  .  Retired   Social History Main Topics  . Smoking status: Former Smoker    Packs/day: 1.00    Years: 30.00    Types: Cigarettes    Quit date: 02/29/1996  . Smokeless tobacco: Never Used  . Alcohol use No  . Drug use: No  . Sexual activity: No   Other Topics Concern  . Not on file   Social History Narrative  . No narrative on file    FAMILY HISTORY:  Family History  Problem Relation Age of Onset  . Pancreatic cancer Brother   . Colon cancer Sister 61       12/2006 dx surgery and chemotherapy  . Ovarian cancer Sister 47       Ovarian ca,  surgery and chemotherapy    CURRENT MEDICATIONS:   Outpatient Encounter Prescriptions as of 07/19/2016  Medication Sig Note  . acetaminophen (TYLENOL) 325 MG tablet Take 2 tablets (650 mg total) by mouth every 6 (six) hours as needed for mild pain (or Fever >/= 101).   Marland Kitchen acyclovir (ZOVIRAX) 800 MG tablet 800 mg 3 (three) times daily.    . feeding supplement, ENSURE ENLIVE, (ENSURE ENLIVE) LIQD Take 237 mLs by mouth 2 (two) times daily between meals.   . folic acid (FOLVITE) 1 MG tablet Take 1 tablet (1 mg total) by mouth daily. For one month then stop.   . hydrochlorothiazide (HYDRODIURIL) 25 MG tablet Take 12.5 mg by mouth every morning.  03/14/2016: 1/2 TAB  . ibuprofen (ADVIL,MOTRIN) 400 MG tablet Take 1 tablet (400 mg total) by mouth every 6 (six) hours as needed for mild pain.   Marland Kitchen lidocaine-prilocaine (EMLA) cream Apply a quarter size amount to port site 1 hour prior to chemo. Do not rub in. Cover with plastic wrap.   . lipase/protease/amylase (CREON) 36000 UNITS CPEP capsule Take 2 capsules (72,000 Units total) by mouth 3 (three) times daily before meals.   Marland Kitchen loperamide (IMODIUM A-D) 2 MG tablet At the onset of diarrhea, take 2 tabs. Then take 1 tab every 2 hours until you have gone 12 hours without having a loose stool. If it is bedtime and you have a loose stool(s) take 2 tabs every 4 hours until morning. Call [facility].   Marland Kitchen losartan (COZAAR) 100 MG tablet Take 100 mg by mouth every morning.    . metoprolol succinate (TOPROL-XL) 50 MG 24 hr tablet Take 25 mg by mouth every morning. Take with or immediately following a meal.  03/14/2016: 1/2 TAB  . omeprazole (PRILOSEC) 20 MG capsule Take 20 mg by mouth daily before breakfast.  03/05/2016: PATIENT CANNOT TAKE PROTONIX (IN-HOUSE)  . ondansetron (ZOFRAN) 8 MG tablet Take 1 tablet (8 mg total) by mouth every 8 (eight) hours as needed for nausea or vomiting.   . Potassium Bicarb-Citric  Acid (EFFER-K) 10 MEQ TBEF Take 1 tablet (10 mEq total) by mouth 2 (two) times daily.   . potassium chloride SA  (K-DUR,KLOR-CON) 20 MEQ tablet Take 1 tablet (20 mEq total) by mouth once.   . prochlorperazine (COMPAZINE) 10 MG tablet Take 1 tablet (10 mg total) by mouth every 6 (six) hours as needed for nausea or vomiting.   Alveda Reasons 20 MG TABS tablet Take 20 mg by mouth daily with supper.     Facility-Administered Encounter Medications as of 07/19/2016  Medication  . [COMPLETED] 0.9 %  sodium chloride infusion  . fluorouracil (ADRUCIL) 2,450 mg in sodium chloride 0.9 % 101 mL chemo infusion  . [COMPLETED] irinotecan LIPOSOME (ONIVYDE) 55.9 mg in sodium chloride 0.9 % 500 mL chemo infusion  . [COMPLETED] leucovorin 652 mg in dextrose 5 % 250 mL infusion  . [COMPLETED] palonosetron (ALOXI) injection 0.25 mg  . [COMPLETED] prochlorperazine (COMPAZINE) tablet 10 mg  . sodium chloride flush (NS) 0.9 % injection 10 mL  . sodium chloride flush (NS) 0.9 % injection 10 mL    ALLERGIES:  Allergies  Allergen Reactions  . Oxaliplatin Anaphylaxis  . Pantoprazole Other (See Comments)    THIS GIVES THE PATIENT TERRIBLE HEARTBURN (PLEASE DO NOT GIVE)  . Adhesive [Tape] Rash  . Aspirin Other (See Comments)    Burns stomach  . Neulasta [Pegfilgrastim] Other (See Comments)    UNSURE   . Oxycodone Nausea And Vomiting  . Tramadol Nausea And Vomiting     PHYSICAL EXAM:  ECOG Performance status: 1 - Symptomatic; remains independent      Physical Exam  Constitutional: She is oriented to person, place, and time and well-developed, well-nourished, and in no distress.  -Pt seen while seated in chemo chair in infusion area.   HENT:  Head: Normocephalic.  Mouth/Throat: Oropharynx is clear and moist.  Eyes: Conjunctivae are normal. No scleral icterus.  Neck: Normal range of motion. Neck supple.  Cardiovascular: Normal rate and regular rhythm.   Pulmonary/Chest: Effort normal and breath sounds normal. No respiratory distress. She has no wheezes.  Abdominal: Soft. Bowel sounds are normal. There is no  tenderness. There is no rebound and no guarding.  Palpable soft tissue mass vs hernia to umbilical area  Musculoskeletal: Normal range of motion. She exhibits no edema.  Diffuse varicose veins and spider veins to bilateral legs, ankles, and feet   Lymphadenopathy:    She has no cervical adenopathy.  Neurological: She is alert and oriented to person, place, and time. No cranial nerve deficit. Gait normal.  Skin: Skin is warm and dry. No rash noted.  Psychiatric: Mood, memory, affect and judgment normal.  Nursing note and vitals reviewed.    LABORATORY DATA:  I have reviewed the labs as listed.  CBC    Component Value Date/Time   WBC 6.3 07/19/2016 1005   RBC 3.14 (L) 07/19/2016 1005   HGB 9.7 (L) 07/19/2016 1005   HCT 30.5 (L) 07/19/2016 1005   PLT 152 07/19/2016 1005   MCV 97.1 07/19/2016 1005   MCH 30.9 07/19/2016 1005   MCHC 31.8 07/19/2016 1005   RDW 16.7 (H) 07/19/2016 1005   LYMPHSABS 1.2 07/19/2016 1005   MONOABS 1.0 07/19/2016 1005   EOSABS 0.0 07/19/2016 1005   BASOSABS 0.0 07/19/2016 1005   CMP Latest Ref Rng & Units 07/19/2016 07/05/2016 06/21/2016  Glucose 65 - 99 mg/dL 95 133(H) 79  BUN 6 - 20 mg/dL 22(H) 18 26(H)  Creatinine 0.44 - 1.00 mg/dL  1.05(H) 1.11(H) 1.10(H)  Sodium 135 - 145 mmol/L 137 137 136  Potassium 3.5 - 5.1 mmol/L 4.2 3.7 5.3(H)  Chloride 101 - 111 mmol/L 109 111 111  CO2 22 - 32 mmol/L 20(L) 19(L) 18(L)  Calcium 8.9 - 10.3 mg/dL 8.8(L) 8.5(L) 9.0  Total Protein 6.5 - 8.1 g/dL 6.4(L) 6.0(L) 6.2(L)  Total Bilirubin 0.3 - 1.2 mg/dL 0.6 0.7 0.4  Alkaline Phos 38 - 126 U/L 101 116 109  AST 15 - 41 U/L '15 18 16  '$ ALT 14 - 54 U/L 10(L) 12(L) 12(L)    PENDING LABS:    DIAGNOSTIC IMAGING:  *The following radiologic images and reports have been reviewed independently and agree with below findings.  CT chest/abd/pelvis: 05/09/16       PATHOLOGY:  (L) lung wedge resection path: 04/20/15         ASSESSMENT & PLAN:   Stage IV  adenocarcinoma of pancreas with lung mets: -Initially diagnosed in 08/2008. Treated with Whipple by Dr. Eugenia Pancoast at [facility]. She received adjuvant chemotherapy with Gemcitabine x 6 months, completing in 08/2009. Found to have metastatic lung disease and underwent wedge resection in 08/2009 for this metastatic disease. Underwent additional left lung resection of lesion felt to be lung primary malignancy with Dr. Arlyce Dice in 05/2010. Underwent SBRT to lung with Dr. Tammi Klippel, which completed on 09/04/11, followed by 5-FU based chemotherapy through 06/13/12. Then was treated with Gemzar/Abraxane for metastatic disease at [facility] in [place] from 02/13/13-05/20/13. Mild progression noted on scans in 06/2014. Gemzar/Abraxane was stopped d/t poor tolerance.  In 03/2015, patient had spontaneous (L) lung pneumothorax. Underwent (L) VATS and mini thoracotomy with path revealing high-grade carcinoma consistent with pancreatic primary. Progression of disease noted on imaging in 09/29/15 and chemotherapy with 5-FU/Leucovorin/Irinotecan started in 09/2015. Again, had spontaneous pneumothorax requiring chest tube placement on 03/05/16 with Dr. Cyndia Bent.  Chemotherapy was held as she recovered and resumed on 04/26/16. Progression of disease noted on CT imaging on 05/09/16, which was likely secondary to being off of therapy x 2 months with pneumothorax.   -She understands that her cancer is not curable, but is treatable.  -Labs reviewed and are adequate for next cycle of 5-FU/Leucovorin/Irinotecan today.  -Restaging scans scheduled for 07/26/16.  -Return to cancer center for follow-up and subsequent chemotherapy as scheduled.   Bilateral lower extremity varicose veins/spider veins: -Noted to bilateral legs, tops of feet, and ankles.  -Denies any pain. Could be secondary to mild thrombocytopenia with current chemotherapy regimen that may be exacerbating the issue.  -I do not recommend any intervention  at this time. Certainly, if she becomes more symptomatic, then we could refer her to a vascular specialist.   Diarrhea:  -Secondary to chemotherapy.  -Continue Imodium PRN, which remains effective. Encouraged her to let us know if Imodium becomes ineffective, as we can give her a prescription for Lomotil, if needed.   Vitamin B12 deficiency:  -Most recent serum vitamin B12 was normal at 391 on 07/05/16. However, I think she may benefit clinically from monthly B12 injections with her anemia and chemotherapy.  Historically, she did receive 4 weekly injections, with last injection being 02/16/16.   -Will plan to resume with monthly B12 injections; supportive therapy treatment plan created.   Headaches:  -CT head pending with next restaging imaging on 07/26/16. I have low clinical suspicion of brain mets given her reported symptoms and that they are clinically improving, but will still proceed with imaging as previously planned.  -  Continue symptom management as needed.   Goals of care:  -We had brief goals of care discussion today. She does understand that her disease is treatable, but is not curable. Reiterated that we will continue to provide our best recommendations for treatment for her cancer, as long as she continues to clinically do well and desires to continue treatment. She understands that there may come a time when continuing treatment would not be in her best interest, and that time we would recommend best supportive care/optimizing quality of life.  She understands this and is not unreasonable in her goals.  We will continue to address going forward.      Dispo:  -Return to cancer center as scheduled.    All questions were answered to patient's stated satisfaction. Encouraged patient to call with any new concerns or questions before her next visit to the cancer center and we can certain see her sooner, if needed.    Plan of care discussed with Dr. Irene Limbo, who agrees with the above  aforementioned.    Orders placed this encounter:  No orders of the defined types were placed in this encounter.     Mike Craze, NP [place] [phone]

## 2016-07-19 NOTE — Patient Instructions (Signed)
[place] at [facility] Discharge Instructions  RECOMMENDATIONS MADE BY THE CONSULTANT AND ANY TEST RESULTS WILL BE SENT TO YOUR REFERRING PHYSICIAN.  You were seen today by Mike Craze NP. Return as scheduled.   Thank you for choosing [place] at [facility] to provide your oncology and hematology care.  To afford each patient quality time with our provider, please arrive at least 15 minutes before your scheduled appointment time.    If you have a lab appointment with the [place] please come in thru the  Main Entrance and check in at the main information desk  You need to re-schedule your appointment should you arrive 10 or more minutes late.  We strive to give you quality time with our providers, and arriving late affects you and other patients whose appointments are after yours.  Also, if you no show three or more times for appointments you may be dismissed from the clinic at the providers discretion.     Again, thank you for choosing [facility].  Our hope is that these requests will decrease the amount of time that you wait before being seen by our physicians.       _____________________________________________________________  Should you have questions after your visit to [facility], please contact our office at [phone] between the hours of 8:30 a.m. and 4:30 p.m.  Voicemails left after 4:30 p.m. will not be returned until the following business day.  For prescription refill requests, have your pharmacy contact our office.       Resources For Cancer Patients and their Caregivers ? American Cancer Society: Can assist with transportation, wigs, general needs, runs Look Good Feel Better.        ([phone] [phone] ? Cancer Care: Provides financial assistance, online support groups, medication/co-pay assistance.  1-800-813-HOPE 915-073-9324) ? [place] Assists [place]  cancer patients and their families through emotional , educational and financial support.  878-494-7733 ? [place] DSS Where to apply for food stamps, Medicaid and utility assistance. 804-111-5226 ? RCATS: Transportation to medical appointments. [phone] [phone] ? Social Security Administration: May apply for disability if have a Stage IV cancer. 431-018-4394 716-499-6100 ? [employer], Disability and Transit Services: Assists with nutrition, care and transit needs. [place] Support Programs: @10RELATIVEDAYS @ > Cancer Support Group  2nd Tuesday of the month 1pm-2pm, Journey Room  > Creative Journey  3rd Tuesday of the month 1130am-1pm, Journey Room  > Look Good Feel Better  1st Wednesday of the month 10am-12 noon, Journey Room (Call [place] to register [phone])

## 2016-07-19 NOTE — Progress Notes (Signed)
[name] [name] presents today for injection per MD orders. Aranesp 500 mcg administered SQ in left Abdomen. Administration without incident. Patient tolerated well. Continuous infusion pump intact. Patient stable and ambulatory on discharge home with sister.

## 2016-07-19 NOTE — Patient Instructions (Signed)
[facility] Discharge Instructions for Patients Receiving Chemotherapy   Beginning January 23rd 2017 lab work for the [facility] will be done in the  Main lab at [facility] on 1st floor. If you have a lab appointment with the [facility] please come in thru the  Main Entrance and check in at the main information desk   Today you received the following chemotherapy agents irinotecan, leucovorin and 73fu pump. You also received aranesp 500 mcg injection. Return Thursday apx 1215 for pump removal.  To help prevent nausea and vomiting after your treatment, we encourage you to take your nausea medication as instructed.   If you develop nausea and vomiting, or diarrhea that is not controlled by your medication, call the clinic.  The clinic phone number is [phone]. Office hours are Monday-Friday 8:30am-5:00pm.  BELOW ARE SYMPTOMS THAT SHOULD BE REPORTED IMMEDIATELY:  *FEVER GREATER THAN 101.0 F  *CHILLS WITH OR WITHOUT FEVER  NAUSEA AND VOMITING THAT IS NOT CONTROLLED WITH YOUR NAUSEA MEDICATION  *UNUSUAL SHORTNESS OF BREATH  *UNUSUAL BRUISING OR BLEEDING  TENDERNESS IN MOUTH AND THROAT WITH OR WITHOUT PRESENCE OF ULCERS  *URINARY PROBLEMS  *BOWEL PROBLEMS  UNUSUAL RASH Items with * indicate a potential emergency and should be followed up as soon as possible. If you have an emergency after office hours please contact your primary care physician or go to the nearest emergency department.  Please call the clinic during office hours if you have any questions or concerns.   You may also contact the Patient Navigator at [phone] should you have any questions or need assistance in obtaining follow up care.      Resources For Cancer Patients and their Caregivers ? American Cancer Society: Can assist with transportation, wigs, general needs, runs Look Good Feel Better.        [phone] ? Cancer Care: Provides financial assistance, online  support groups, medication/co-pay assistance.  1-800-813-HOPE 406-479-1191) ? [place] Assists [place] cancer patients and their families through emotional , educational and financial support.  984-444-6212 ? [place] DSS Where to apply for food stamps, Medicaid and utility assistance. 351-808-8920 ? RCATS: Transportation to medical appointments. [phone] ? Social Security Administration: May apply for disability if have a Stage IV cancer. [phone] ? [employer], Disability and Transit Services: Assists with nutrition, care and transit needs. [phone]

## 2016-07-20 ENCOUNTER — Encounter: Payer: Self-pay | Admitting: Cardiothoracic Surgery

## 2016-07-20 ENCOUNTER — Ambulatory Visit
Admission: RE | Admit: 2016-07-20 | Discharge: 2016-07-20 | Disposition: A | Discharge status: Home / Self Care | Payer: Medicare Other | Source: Ambulatory Visit | Attending: Cardiothoracic Surgery | Admitting: Cardiothoracic Surgery

## 2016-07-20 ENCOUNTER — Ambulatory Visit (HOSPITAL_COMMUNITY): Payer: Medicare Other

## 2016-07-20 ENCOUNTER — Ambulatory Visit (INDEPENDENT_AMBULATORY_CARE_PROVIDER_SITE_OTHER): Payer: Medicare Other | Admitting: Cardiothoracic Surgery

## 2016-07-20 VITALS — BP 134/60 | HR 58 | Resp 20 | Ht 64.0 in | Wt 119.0 lb

## 2016-07-20 DIAGNOSIS — C259 Malignant neoplasm of pancreas, unspecified: Secondary | ICD-10-CM

## 2016-07-20 DIAGNOSIS — C3492 Malignant neoplasm of unspecified part of left bronchus or lung: Secondary | ICD-10-CM | POA: Diagnosis not present

## 2016-07-20 DIAGNOSIS — IMO0002 Reserved for concepts with insufficient information to code with codable children: Secondary | ICD-10-CM

## 2016-07-20 DIAGNOSIS — J9382 Other air leak: Secondary | ICD-10-CM

## 2016-07-20 DIAGNOSIS — C3490 Malignant neoplasm of unspecified part of unspecified bronchus or lung: Secondary | ICD-10-CM

## 2016-07-20 DIAGNOSIS — C799 Secondary malignant neoplasm of unspecified site: Secondary | ICD-10-CM

## 2016-07-20 DIAGNOSIS — C3412 Malignant neoplasm of upper lobe, left bronchus or lung: Secondary | ICD-10-CM | POA: Diagnosis not present

## 2016-07-20 NOTE — Progress Notes (Signed)
PCP is Monico Blitz, MD Referring Provider is Sheldon Silvan Manon Hilding, PA-C  Chief Complaint  Patient presents with  . Spontaneous Pneumothorax    3 month f/u with CXR    HPI: The patient returns with chest x-ray 3 months after being hospitalized with a right spontaneous pneumothorax treated with pigtail catheter placement by interventional radiology. This very nice patient unfortunately has metastatic pancreatic cancer to both lungs and has caused pneumothorax problems bilaterally  requiring surgical procedures including pulmonary resection and talc pleurodesis. Current she has no symptoms of pleuritic pain or increased shortness of breath. Chest x-ray shows scarring from her previous surgery, metastatic tumor, but no pneumothorax or pleural effusion. She is actually undergoing chemotherapy by the oncology clinic in [place]  Past Medical History:  Diagnosis Date  . Abdominal wall hernia   . Anemia   . Anemia due to antineoplastic chemotherapy 07/05/2016  . Aortic aneurysm (HCC)    small aortic aneurysm  . Arthritis    "little in my right hand" (03/10/2016)  . Cervical cancer ([place]) 1963  . COPD (chronic obstructive pulmonary disease) ([place])   . Emphysema   . GERD (gastroesophageal reflux disease)   . Goals of care, counseling/discussion 02/02/2016  . Hypercholesterolemia   . Hypertension   . Low serum vitamin B12 01/20/2016  . Pancreatic adenocarcinoma [facility]) July 2010  . Pancreatic cancer metastasized to lung [facility]) 09/04/2015    Past Surgical History:  Procedure Laterality Date  . BREAST CYST EXCISION Right 1988  . BUNIONECTOMY Bilateral 2007  . CARDIAC CATHETERIZATION    . CATARACT EXTRACTION W/ INTRAOCULAR LENS  IMPLANT, BILATERAL Bilateral 2009  . CHOLECYSTECTOMY  July 2010   done w/ Whipple procedure  . IR GENERIC HISTORICAL  03/15/2016   IR GUIDED DRAIN W CATHETER PLACEMENT 03/15/2016 Greggory Keen, MD [facility]-INTERV RAD  . SKIN CANCER EXCISION Left    nasal fold  . STAPLING OF  BLEBS Left 04/20/2015   Procedure: STAPLING OF BLEBS;  Surgeon: Ivin Poot, MD;  Location: [facility];  Service: Thoracic;  Laterality: Left;  . THYROID SURGERY     "had a growth taken off"  . TONSILLECTOMY  1943  . VAGINAL HYSTERECTOMY  1963   Cervical Cancer  . VIDEO ASSISTED THORACOSCOPY Left 04/20/2015   Procedure: VIDEO ASSISTED THORACOSCOPY;  Surgeon: Ivin Poot, MD;  Location: [facility];  Service: Thoracic;  Laterality: Left;  . WHIPPLE PROCEDURE  July 2010   pancreatic adenocarcinoma    Family History  Problem Relation Age of Onset  . Pancreatic cancer Brother   . Colon cancer Sister 51       12/2006 dx surgery and chemotherapy  . Ovarian cancer Sister 35       Ovarian ca,  surgery and chemotherapy    Social History Social History  Substance Use Topics  . Smoking status: Former Smoker    Packs/day: 1.00    Years: 30.00    Types: Cigarettes    Quit date: 02/29/1996  . Smokeless tobacco: Never Used  . Alcohol use No    Current Outpatient Prescriptions  Medication Sig Dispense Refill  . acetaminophen (TYLENOL) 325 MG tablet Take 2 tablets (650 mg total) by mouth every 6 (six) hours as needed for mild pain (or Fever >/= 101).    . feeding supplement, ENSURE ENLIVE, (ENSURE ENLIVE) LIQD Take 237 mLs by mouth 2 (two) times daily between meals. 237 mL 12  . hydrochlorothiazide (HYDRODIURIL) 25 MG tablet Take 12.5 mg by mouth every morning.     Marland Kitchen  ibuprofen (ADVIL,MOTRIN) 400 MG tablet Take 1 tablet (400 mg total) by mouth every 6 (six) hours as needed for mild pain. 30 tablet 0  . lipase/protease/amylase (CREON) 36000 UNITS CPEP capsule Take 2 capsules (72,000 Units total) by mouth 3 (three) times daily before meals. 540 capsule 0  . loperamide (IMODIUM A-D) 2 MG tablet At the onset of diarrhea, take 2 tabs. Then take 1 tab every 2 hours until you have gone 12 hours without having a loose stool. If it is bedtime and you have a loose stool(s) take 2 tabs every 4 hours until  morning. Call [place]. 60 tablet 1  . losartan (COZAAR) 100 MG tablet Take 100 mg by mouth every morning.     . metoprolol succinate (TOPROL-XL) 50 MG 24 hr tablet Take 25 mg by mouth every morning. Take with or immediately following a meal.     . omeprazole (PRILOSEC) 20 MG capsule Take 20 mg by mouth daily before breakfast.     . ondansetron (ZOFRAN) 8 MG tablet Take 1 tablet (8 mg total) by mouth every 8 (eight) hours as needed for nausea or vomiting. 30 tablet 2  . prochlorperazine (COMPAZINE) 10 MG tablet Take 1 tablet (10 mg total) by mouth every 6 (six) hours as needed for nausea or vomiting. 30 tablet 2  . XARELTO 20 MG TABS tablet Take 20 mg by mouth daily with supper.      No current facility-administered medications for this visit.    Facility-Administered Medications Ordered in Other Visits  Medication Dose Route Frequency Provider Last Rate Last Dose  . sodium chloride flush (NS) 0.9 % injection 10 mL  10 mL Intracatheter PRN Pen[place], Kelby Fam, MD        Allergies  Allergen Reactions  . Oxaliplatin Anaphylaxis  . Pantoprazole Other (See Comments)    THIS GIVES THE PATIENT TERRIBLE HEARTBURN (PLEASE DO NOT GIVE)  . Adhesive [Tape] Rash  . Aspirin Other (See Comments)    Burns stomach  . Neulasta [Pegfilgrastim] Other (See Comments)    UNSURE   . Oxycodone Nausea And Vomiting  . Tramadol Nausea And Vomiting    Review of Systems  No pulmonary symptoms except for dyspnea on exertion No fever Continues to lose weight   BP 134/60   Pulse (!) 58   Resp 20   Ht 5\' 4"  (1.626 m)   Wt 119 lb (54 kg)   SpO2 97% Comment: RA  BMI 20.43 kg/m  Physical Exam Very bright and pleasant woman who is chronically ill appearing Breath sounds are clear but diminished bilaterally Bilateral thoracotomy scars well-healed Heart rate regular 1-2 plus pedal edema with venous insufficiency and medial malleolar skin changes of eminent stasis ulcer  Diagnostic Tests: Chest x-ray  results showed no pneumothorax but with bilateral postoperative changes, COPD and metastatic pancreatic cancer stable  Impression: No recurrent pneumothorax issues at this time Venous hypertension of her lower extremities with some venous stasis ulcers trying to form Have recommended and provided prescription for TED hose  Plan: Return with chest x-ray in 3-4 months to monitor for pneumothorax recurrence  Len Childs, MD [facility] [phone]

## 2016-07-21 ENCOUNTER — Encounter (HOSPITAL_COMMUNITY): Payer: Self-pay

## 2016-07-21 ENCOUNTER — Encounter (HOSPITAL_BASED_OUTPATIENT_CLINIC_OR_DEPARTMENT_OTHER): Payer: Medicare Other

## 2016-07-21 VITALS — BP 112/62 | HR 55 | Temp 97.8°F | Resp 8

## 2016-07-21 DIAGNOSIS — C259 Malignant neoplasm of pancreas, unspecified: Secondary | ICD-10-CM

## 2016-07-21 DIAGNOSIS — C7800 Secondary malignant neoplasm of unspecified lung: Principal | ICD-10-CM

## 2016-07-21 DIAGNOSIS — C7802 Secondary malignant neoplasm of left lung: Secondary | ICD-10-CM | POA: Diagnosis not present

## 2016-07-21 MED ORDER — SODIUM CHLORIDE 0.9% FLUSH
10.0000 mL | INTRAVENOUS | Status: DC | PRN
  Administered 2016-07-21: 10 mL
  Filled 2016-07-21: qty 10

## 2016-07-21 MED ORDER — HEPARIN SOD (PORK) LOCK FLUSH 100 UNIT/ML IV SOLN
500.0000 [IU] | Freq: Once | INTRAVENOUS | Status: AC | PRN
  Administered 2016-07-21: 500 [IU]
  Filled 2016-07-21: qty 5

## 2016-07-21 NOTE — Patient Instructions (Signed)
[place] at [facility] Discharge Instructions  RECOMMENDATIONS MADE BY THE CONSULTANT AND ANY TEST RESULTS WILL BE SENT TO YOUR REFERRING PHYSICIAN.  Pump removal and port flush today. Return as scheduled.  Thank you for choosing [place] at [facility] to provide your oncology and hematology care.  To afford each patient quality time with our provider, please arrive at least 15 minutes before your scheduled appointment time.    If you have a lab appointment with the [facility] please come in thru the  Main Entrance and check in at the main information desk  You need to re-schedule your appointment should you arrive 10 or more minutes late.  We strive to give you quality time with our providers, and arriving late affects you and other patients whose appointments are after yours.  Also, if you no show three or more times for appointments you may be dismissed from the clinic at the providers discretion.     Again, thank you for choosing [facility].  Our hope is that these requests will decrease the amount of time that you wait before being seen by our physicians.       _____________________________________________________________  Should you have questions after your visit to [facility], please contact our office at [phone] between the hours of 8:30 a.m. and 4:30 p.m.  Voicemails left after 4:30 p.m. will not be returned until the following business day.  For prescription refill requests, have your pharmacy contact our office.       Resources For Cancer Patients and their Caregivers ? American Cancer Society: Can assist with transportation, wigs, general needs, runs Look Good Feel Better.        6030472198 ? Cancer Care: Provides financial assistance, online support groups, medication/co-pay assistance.  1-800-813-HOPE 337-345-7753) ? [place] Assists [place] cancer  patients and their families through emotional , educational and financial support.  [phone] ? [place] DSS Where to apply for food stamps, Medicaid and utility assistance. 346-853-1267 ? RCATS: Transportation to medical appointments. [phone] ? Social Security Administration: May apply for disability if have a Stage IV cancer. 520-436-9203 [phone] ? [employer], Disability and Transit Services: Assists with nutrition, care and transit needs. [place] Support Programs: @10RELATIVEDAYS @ > Cancer Support Group  2nd Tuesday of the month 1pm-2pm, Journey Room  > Creative Journey  3rd Tuesday of the month 1130am-1pm, Journey Room  > Look Good Feel Better  1st Wednesday of the month 10am-12 noon, Journey Room (Call [place] to register [phone])

## 2016-07-21 NOTE — Progress Notes (Signed)
[name] [name] presents to have home infusion pump d/c'd and for port-a-cath deaccess with flush.  Portacath located right chest wall accessed with  H 20 needle.  Good blood return present. Portacath flushed with NS and 500U/40ml Heparin, and needle removed intact.  Procedure tolerated well and without incident.  Discharged ambulatory.

## 2016-07-26 ENCOUNTER — Ambulatory Visit (HOSPITAL_COMMUNITY)
Admission: RE | Admit: 2016-07-26 | Discharge: 2016-07-26 | Disposition: A | Discharge status: Home / Self Care | Payer: Medicare Other | Source: Ambulatory Visit | Attending: Oncology | Admitting: Oncology

## 2016-07-26 ENCOUNTER — Encounter (HOSPITAL_COMMUNITY): Payer: Self-pay

## 2016-07-26 DIAGNOSIS — C7951 Secondary malignant neoplasm of bone: Secondary | ICD-10-CM | POA: Insufficient documentation

## 2016-07-26 DIAGNOSIS — C259 Malignant neoplasm of pancreas, unspecified: Secondary | ICD-10-CM

## 2016-07-26 DIAGNOSIS — C7800 Secondary malignant neoplasm of unspecified lung: Secondary | ICD-10-CM | POA: Diagnosis not present

## 2016-07-26 DIAGNOSIS — I2510 Atherosclerotic heart disease of native coronary artery without angina pectoris: Secondary | ICD-10-CM | POA: Diagnosis not present

## 2016-07-26 DIAGNOSIS — I739 Peripheral vascular disease, unspecified: Secondary | ICD-10-CM | POA: Diagnosis not present

## 2016-07-26 DIAGNOSIS — I82C21 Chronic embolism and thrombosis of right internal jugular vein: Secondary | ICD-10-CM | POA: Diagnosis not present

## 2016-07-26 DIAGNOSIS — R519 Headache, unspecified: Secondary | ICD-10-CM

## 2016-07-26 DIAGNOSIS — J439 Emphysema, unspecified: Secondary | ICD-10-CM | POA: Insufficient documentation

## 2016-07-26 DIAGNOSIS — I700 Atherosclerosis of aorta: Secondary | ICD-10-CM | POA: Diagnosis not present

## 2016-07-26 DIAGNOSIS — R51 Headache: Secondary | ICD-10-CM

## 2016-07-26 DIAGNOSIS — K439 Ventral hernia without obstruction or gangrene: Secondary | ICD-10-CM | POA: Insufficient documentation

## 2016-07-26 DIAGNOSIS — I714 Abdominal aortic aneurysm, without rupture: Secondary | ICD-10-CM | POA: Diagnosis not present

## 2016-07-26 MED ORDER — IOPAMIDOL (ISOVUE-300) INJECTION 61%
75.0000 mL | Freq: Once | INTRAVENOUS | Status: AC | PRN
  Administered 2016-07-26: 75 mL via INTRAVENOUS

## 2016-08-02 ENCOUNTER — Encounter (HOSPITAL_COMMUNITY): Payer: Self-pay | Admitting: Oncology

## 2016-08-02 ENCOUNTER — Other Ambulatory Visit (HOSPITAL_COMMUNITY): Payer: Self-pay | Admitting: Oncology

## 2016-08-02 ENCOUNTER — Encounter (HOSPITAL_BASED_OUTPATIENT_CLINIC_OR_DEPARTMENT_OTHER): Payer: Medicare Other | Admitting: Oncology

## 2016-08-02 ENCOUNTER — Encounter (HOSPITAL_COMMUNITY): Payer: Medicare Other | Attending: Adult Health

## 2016-08-02 VITALS — BP 139/46 | HR 49 | Temp 97.8°F | Resp 18 | Wt 118.0 lb

## 2016-08-02 DIAGNOSIS — R197 Diarrhea, unspecified: Secondary | ICD-10-CM

## 2016-08-02 DIAGNOSIS — Z95828 Presence of other vascular implants and grafts: Secondary | ICD-10-CM | POA: Insufficient documentation

## 2016-08-02 DIAGNOSIS — C7802 Secondary malignant neoplasm of left lung: Secondary | ICD-10-CM | POA: Diagnosis not present

## 2016-08-02 DIAGNOSIS — T451X5A Adverse effect of antineoplastic and immunosuppressive drugs, initial encounter: Secondary | ICD-10-CM

## 2016-08-02 DIAGNOSIS — C7800 Secondary malignant neoplasm of unspecified lung: Secondary | ICD-10-CM | POA: Diagnosis not present

## 2016-08-02 DIAGNOSIS — Z5111 Encounter for antineoplastic chemotherapy: Secondary | ICD-10-CM | POA: Diagnosis not present

## 2016-08-02 DIAGNOSIS — D649 Anemia, unspecified: Secondary | ICD-10-CM

## 2016-08-02 DIAGNOSIS — C259 Malignant neoplasm of pancreas, unspecified: Secondary | ICD-10-CM

## 2016-08-02 DIAGNOSIS — R51 Headache: Secondary | ICD-10-CM

## 2016-08-02 DIAGNOSIS — D6481 Anemia due to antineoplastic chemotherapy: Secondary | ICD-10-CM

## 2016-08-02 LAB — CBC WITH DIFFERENTIAL/PLATELET
BASOS PCT: 0 %
Basophils Absolute: 0 10*3/uL (ref 0.0–0.1)
Eosinophils Absolute: 0 10*3/uL (ref 0.0–0.7)
Eosinophils Relative: 1 %
HEMATOCRIT: 34.3 % — AB (ref 36.0–46.0)
Hemoglobin: 10.9 g/dL — ABNORMAL LOW (ref 12.0–15.0)
LYMPHS ABS: 1 10*3/uL (ref 0.7–4.0)
Lymphocytes Relative: 15 %
MCH: 31.3 pg (ref 26.0–34.0)
MCHC: 31.8 g/dL (ref 30.0–36.0)
MCV: 98.6 fL (ref 78.0–100.0)
MONO ABS: 0.7 10*3/uL (ref 0.1–1.0)
MONOS PCT: 12 %
NEUTROS ABS: 4.6 10*3/uL (ref 1.7–7.7)
NEUTROS PCT: 72 %
Platelets: 139 10*3/uL — ABNORMAL LOW (ref 150–400)
RBC: 3.48 MIL/uL — ABNORMAL LOW (ref 3.87–5.11)
RDW: 17.7 % — AB (ref 11.5–15.5)
WBC: 6.4 10*3/uL (ref 4.0–10.5)

## 2016-08-02 LAB — COMPREHENSIVE METABOLIC PANEL
ALK PHOS: 111 U/L (ref 38–126)
ALT: 11 U/L — ABNORMAL LOW (ref 14–54)
ANION GAP: 6 (ref 5–15)
AST: 16 U/L (ref 15–41)
Albumin: 3.3 g/dL — ABNORMAL LOW (ref 3.5–5.0)
BILIRUBIN TOTAL: 0.7 mg/dL (ref 0.3–1.2)
BUN: 17 mg/dL (ref 6–20)
CALCIUM: 8.6 mg/dL — AB (ref 8.9–10.3)
CO2: 21 mmol/L — ABNORMAL LOW (ref 22–32)
Chloride: 110 mmol/L (ref 101–111)
Creatinine, Ser: 1.16 mg/dL — ABNORMAL HIGH (ref 0.44–1.00)
GFR, EST AFRICAN AMERICAN: 51 mL/min — AB (ref >60)
GFR, EST NON AFRICAN AMERICAN: 44 mL/min — AB (ref >60)
Glucose, Bld: 85 mg/dL (ref 65–99)
POTASSIUM: 4.5 mmol/L (ref 3.5–5.1)
Sodium: 137 mmol/L (ref 135–145)
TOTAL PROTEIN: 6.4 g/dL — AB (ref 6.5–8.1)

## 2016-08-02 MED ORDER — CYANOCOBALAMIN 1000 MCG/ML IJ SOLN
1000.0000 ug | Freq: Once | INTRAMUSCULAR | Status: AC
  Administered 2016-08-02: 1000 ug via INTRAMUSCULAR
  Filled 2016-08-02: qty 1

## 2016-08-02 MED ORDER — PROCHLORPERAZINE MALEATE 10 MG PO TABS
10.0000 mg | ORAL_TABLET | Freq: Once | ORAL | Status: AC
  Administered 2016-08-02: 10 mg via ORAL
  Filled 2016-08-02: qty 1

## 2016-08-02 MED ORDER — HEPARIN SOD (PORK) LOCK FLUSH 100 UNIT/ML IV SOLN
500.0000 [IU] | Freq: Once | INTRAVENOUS | Status: DC | PRN
  Filled 2016-08-02 (×2): qty 5

## 2016-08-02 MED ORDER — SODIUM CHLORIDE 0.9 % IV SOLN
1500.0000 mg/m2 | INTRAVENOUS | Status: DC
  Administered 2016-08-02: 2450 mg via INTRAVENOUS
  Filled 2016-08-02: qty 49

## 2016-08-02 MED ORDER — SODIUM CHLORIDE 0.9 % IV SOLN
35.0000 mg/m2 | Freq: Once | INTRAVENOUS | Status: AC
  Administered 2016-08-02: 55.9 mg via INTRAVENOUS
  Filled 2016-08-02: qty 13

## 2016-08-02 MED ORDER — PALONOSETRON HCL INJECTION 0.25 MG/5ML
0.2500 mg | Freq: Once | INTRAVENOUS | Status: AC
  Administered 2016-08-02: 0.25 mg via INTRAVENOUS
  Filled 2016-08-02: qty 5

## 2016-08-02 MED ORDER — LEUCOVORIN CALCIUM INJECTION 350 MG
400.0000 mg/m2 | Freq: Once | INTRAVENOUS | Status: AC
  Administered 2016-08-02: 652 mg via INTRAVENOUS
  Filled 2016-08-02: qty 32.6

## 2016-08-02 MED ORDER — SODIUM CHLORIDE 0.9 % IV SOLN
Freq: Once | INTRAVENOUS | Status: AC
  Administered 2016-08-02: 10:00:00 via INTRAVENOUS

## 2016-08-02 NOTE — Patient Instructions (Addendum)
[place] at [facility] Discharge Instructions  RECOMMENDATIONS MADE BY THE CONSULTANT AND ANY TEST RESULTS WILL BE SENT TO YOUR REFERRING PHYSICIAN.  You were seen today by Dr. Oliva Bustard. Continue current treatment schedule. Follow up as scheduled.    Thank you for choosing [place] at [facility] to provide your oncology and hematology care.  To afford each patient quality time with our provider, please arrive at least 15 minutes before your scheduled appointment time.    If you have a lab appointment with the [facility] please come in thru the  Main Entrance and check in at the main information desk  You need to re-schedule your appointment should you arrive 10 or more minutes late.  We strive to give you quality time with our providers, and arriving late affects you and other patients whose appointments are after yours.  Also, if you no show three or more times for appointments you may be dismissed from the clinic at the providers discretion.     Again, thank you for choosing [facility].  Our hope is that these requests will decrease the amount of time that you wait before being seen by our physicians.       _____________________________________________________________  Should you have questions after your visit to [facility], please contact our office at [phone] between the hours of 8:30 a.m. and 4:30 p.m.  Voicemails left after 4:30 p.m. will not be returned until the following business day.  For prescription refill requests, have your pharmacy contact our office.       Resources For Cancer Patients and their Caregivers ? American Cancer Society: Can assist with transportation, wigs, general needs, runs Look Good Feel Better.        856-463-3573 ? Cancer Care: Provides financial assistance, online support groups, medication/co-pay assistance.  1-800-813-HOPE 367 065 4948) ? [place] Assists [place] cancer patients and their families through emotional , educational and financial support.  [phone] ? [place] DSS Where to apply for food stamps, Medicaid and utility assistance. 214-500-8228 ? RCATS: Transportation to medical appointments. 409-138-0080 ? Social Security Administration: May apply for disability if have a Stage IV cancer. [phone] ? [employer], Disability and Transit Services: Assists with nutrition, care and transit needs. [place] Support Programs: @10RELATIVEDAYS @ > Cancer Support Group  2nd Tuesday of the month 1pm-2pm, Journey Room  > Creative Journey  3rd Tuesday of the month 1130am-1pm, Journey Room  > Look Good Feel Better  1st Wednesday of the month 10am-12 noon, Journey Room (Call [place] to register [phone])

## 2016-08-02 NOTE — Progress Notes (Signed)
[name] [name] presents today for injection per MD orders. B12 1000 mcg administered IM in left deltoid. Administration without incident. Patient tolerated well.  Patient tolerated infusion today. No complaints or reactions.  Patient connected to home infusion pump and it was running on discharge. Patient discharged ambulatory and in stable condition to self.  Follow up as scheduled.

## 2016-08-02 NOTE — Progress Notes (Signed)
Monico Blitz, [place] [id number]  No diagnosis found.  CURRENT THERAPY: Liposomal Irinotecan/5FU/Leucovorin  INTERVAL HISTORY: [name] [name] 76 y.o. female returns for followup of Stage IV pancreatic, currently on salvage/palliative liposomal irinotecan/5FU/Leucovorin AND H/O stage I BAC of lungs AND Recurrent pneumothorax requiring chest tube placement, followed/managed by Dr. Nils Pyle, resulting in [facility] chemotherapy 03/01/2016- 04/2016.    Pancreatic cancer metastasized to lung [facility])   09/03/2008 Surgery    Whipple with Dr. Birdie Sons for T3N1 3/12 LN+ Pancreatic Adenocarcinoma      11/13/2008 - 09/12/2009 Chemotherapy    Adjuvant Gemcitabine X 6 months      08/31/2009 Surgery    Metastectomy with Dr. Arlyce Dice RUL 2 nodules, RLL (wedge resection X 2)      08/31/2009 Pathology Results    Metastatic adenocarcinoma TTF-1 negative      06/03/2010 Surgery    Left video-assisted thoracoscopic surgery, resection of left lower lobe lesion. with Dr. Arlyce Dice      06/04/2010 Pathology Results    adenocarcinoma positive for cytokeratin 7, focally positive for CDX2, with scattered staining for TTF1 cytokeratin 20. [facility] opinion felt to be lung primary although noted to stain for GI markers, not felt to be pancreatic primary. EGFR and ALK negative      03/31/2011 - 09/04/2011 Radiation Therapy    SBRT LUL 18Gy Dr. Tammi Klippel      03/15/2012 - 06/13/2012 Chemotherapy    5-FU based chemotherapy      02/13/2013 - 05/20/2013 Chemotherapy    Gemzar Abraxane for metastatic disease (given to cover both primaries at [facility])      07/15/2014 PET scan    Mild progression of pulm mets, hypermet mesenteric tumor assoc with  SMA is stable to slightly increased in degree of FDG uptake      07/23/2014 Imaging         07/30/2014 - 09/10/2014 Chemotherapy    Gemzar abraxane stopped due to poor tolerance      04/06/2015 Procedure    L 20 French chest tube for  spontaneous pneumothorax, by Dr. Tharon Aquas Trigt      04/20/2015 Pathology Results    High grade carcinoma c/w patient's known pancreatic primary (secondary opinion at [facility]) PDL-1 high expression, preserved major and minor MMR, MSI stable, [facility] ONE testing performed,       04/20/2015 Surgery    Left VATS, mini thoracotomy with stapling and oversewing of blebs, Talc pleurodesis. performed secondary to persistent air leak not improved with chest tube drainage      07/29/2015 Imaging    No signif change in appear of multifocal B cavitary pulmonary lesions, postop appearance upper abdomen, no findings of met disease to abd or pelvis,      09/29/2015 Imaging    Slight progression of multifocal cavitary pulmonary metastases, as discussed above. 2. Interval development of a lytic lesion in the antral lateral aspect of the left fifth rib where there is a nondisplaced pathologic fracture. No other definite osseous metastases are identified. 3. Prominent soft tissue adjacent to the proximal superior mesenteric artery and in the aortocaval nodal station, similar to prior examinations, likely to represent treated metastatic lymphadenopathy. 4. Epigastric ventral hernia containing several short loops of small bowel. There is a focally dilated loop of small bowel adjacent to this, however, this is an isolated finding. No other dilatation of small bowel or colon to suggest frank bowel obstruction at this time.  10/19/2015 - 03/01/2016 Chemotherapy    The patient had palonosetron (ALOXI) injection 0.25 mg, 0.25 mg, Intravenous,  Once, 10 of 12 cycles  leucovorin 652 mg in dextrose 5 % 250 mL infusion, 400 mg/m2 = 652 mg, Intravenous,  Once, 10 of 12 cycles  fluorouracil (ADRUCIL) 2,950 mg in sodium chloride 0.9 % 91 mL chemo infusion, 1,800 mg/m2 = 2,950 mg (100 % of original dose 1,800 mg/m2), Intravenous, 1 Day/Dose, 10 of 12 cycles Dose modification: 1,920 mg/m2  (original dose 1,800 mg/m2, Cycle 1, Reason: Provider Judgment), 1,800 mg/m2 (original dose 1,800 mg/m2, Cycle 1, Reason: Provider Judgment), 1,500 mg/m2 (original dose 1,800 mg/m2, Cycle 2, Reason: Dose not tolerated)  irinotecan LIPOSOME (ONIVYDE) 55.9 mg in sodium chloride 0.9 % 500 mL chemo infusion, 35 mg/m2 = 55.9 mg (100 % of original dose 35 mg/m2), Intravenous, Once, 10 of 12 cycles Dose modification: 35 mg/m2 (original dose 35 mg/m2, Cycle 1, Reason: Provider Judgment)  for chemotherapy treatment.        01/12/2016 Imaging    CT CAP- 1. Cavitary lung metastases are stable to decreased in size. 2. Expansile mixed lytic and sclerotic bone metastasis in the anterior left fifth rib is increased in sclerosis and mildly increased in size. 3. Infiltrative soft tissue in the retroperitoneum abutting the SMA is stable. 4. No new sites of metastatic disease in the chest, abdomen or pelvis.      03/05/2016 Procedure    Chest tube placed for spontaneous pneumothorax by Dr. Cyndia Bent      03/05/2016 - 03/10/2016 Hospital Admission    Admit date: 03/05/2016 Admission diagnosis: Spontenous pneumothorax Additional comments: chest tube placed by Dr. Cyndia Bent on 03/05/2016      03/14/2016 - 03/18/2016 Hospital Admission    Admit date: 03/14/2016 Admission diagnosis: Recurrent spontaneous pneumothorax Additional comments:       04/26/2016 -  Chemotherapy    The patient had palonosetron (ALOXI) injection 0.25 mg, 0.25 mg, Intravenous,  Once, 10 of 12 cycles  leucovorin 652 mg in dextrose 5 % 250 mL infusion, 400 mg/m2 = 652 mg, Intravenous,  Once, 10 of 12 cycles  fluorouracil (ADRUCIL) 2,950 mg in sodium chloride 0.9 % 91 mL chemo infusion, 1,800 mg/m2 = 2,950 mg (100 % of original dose 1,800 mg/m2), Intravenous, 1 Day/Dose, 10 of 12 cycles Dose modification: 1,920 mg/m2 (original dose 1,800 mg/m2, Cycle 1, Reason: Provider Judgment), 1,800 mg/m2 (original dose 1,800 mg/m2, Cycle 1, Reason: Provider  Judgment), 1,500 mg/m2 (original dose 1,800 mg/m2, Cycle 2, Reason: Dose not tolerated)  irinotecan LIPOSOME (ONIVYDE) 55.9 mg in sodium chloride 0.9 % 500 mL chemo infusion, 35 mg/m2 = 55.9 mg (100 % of original dose 35 mg/m2), Intravenous, Once, 10 of 12 cycles Dose modification: 35 mg/m2 (original dose 35 mg/m2, Cycle 1, Reason: Provider Judgment)  for chemotherapy treatment.        05/09/2016 Imaging    CT CAP- CT CHEST IMPRESSION  1. Mild progression of pulmonary metastasis. Although the majority of cavitary lung lesions are primarily similar, there are new and increased small cavitary nodules. 2. Trace residual loculated inferolateral right pleural air. 3. No thoracic adenopathy. 4. Progression of anterior left fifth rib metastasis with soft tissue component surrounding.  CT ABDOMEN AND PELVIS IMPRESSION  1. Status post Whipple procedure, without locally recurrent disease. 2. Similar soft tissue fullness within the retroperitoneum. 3. Pneumobilia. Possible mild cirrhosis. 4. Similar small bowel containing ventral abdominal wall laxity. 5. Similar abdominal aortic aneurysm.      05/09/2016  Progression    Progression of disease after being off therapy x 2 months.      07/26/2016 Imaging    CT CAP- 1. Stable appearance of pulmonary metastases. 2. No thoracic adenopathy. 3. 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 4. Status post Whipple procedure. 5. Similar soft tissue within the retroperitoneum involving the SMA and aorto caval space. 6. Pneumobilia and possible mild cirrhosis. 7. Ventral abdominal wall hernia containing nonobstructed loops of small bowel. 8. Abdominal aortic aneurysm measures 3.7 cm. Unchanged from previous exam. 9. Aortic Atherosclerosis (ICD10-I70.0) and Emphysema (ICD10-J43.9). Coronary artery calcifications noted.      07/26/2016 Imaging    CT head- 1. No metastatic disease or acute intracranial abnormality identified. 2.  Recannulized right transverse sinus through right IJ bulb thrombosis since the 2016 MRI. Small volume of residual chronic nonocclusive thrombus. 3. Mild for age chronic small vessel disease is unchanged since 2016.        HPI Elements Pancreatic Ca  Location: Pancreas  Quality: Adenocarcinoma  Severity: Stage IV  Duration: Dx in 08/2008  Context: S/P whipple with Dr. Eugenia Pancoast on 09/03/2008 followed by adjuvant Gemcitabine x 6 mo, then with metastectomy by Dr. Arlyce Dice in 08/2009, SBRT in 03/2011, ongoing systemic chemotherapy.  Timing:   Modifying Factors:   Associated Signs & Symptoms:     She reports an appetite of 75%.  Her weight is stable.  She rates her energy level at 50%.  She denies any pain.  She continues to have intermittent loose stools/diarrhea.  She is utilizing Imodium for this.  She reports a headache that has been ongoing for months.  She describes it as intermittent.  She notes that it is a frontal headache and deep to her eyes.  She denies any visual changes.  She denies any dizziness or loss of consciousness.  She denies any nausea or vomiting. She  is here for ongoing evaluation and continuation of chemotherapy./ No abdominal pain.  Appetite has been stable.  Patient had a CT scan of the chest abdomen and head which has been reviewed independently. Review of Systems  Constitutional: Negative.  Negative for chills, fever and weight loss.  HENT: Negative.   Eyes: Negative.   Respiratory: Negative.  Negative for cough.   Cardiovascular: Negative.  Negative for chest pain.  Gastrointestinal: Positive for diarrhea. Negative for blood in stool, constipation, melena, nausea and vomiting.  Genitourinary: Negative.   Musculoskeletal: Negative.   Skin: Negative.   Neurological: Negative for weakness.  Endo/Heme/Allergies: Negative.   Psychiatric/Behavioral: Negative.     Past Medical History:  Diagnosis Date  . Abdominal wall hernia   . Anemia   . Anemia due to  antineoplastic chemotherapy 07/05/2016  . Aortic aneurysm (HCC)    small aortic aneurysm  . Arthritis    "little in my right hand" (03/10/2016)  . Cervical cancer ([place]) 1963  . COPD (chronic obstructive pulmonary disease) ([place])   . Emphysema   . GERD (gastroesophageal reflux disease)   . Goals of care, counseling/discussion 02/02/2016  . Hypercholesterolemia   . Hypertension   . Low serum vitamin B12 01/20/2016  . Pancreatic adenocarcinoma [facility]) July 2010  . Pancreatic cancer metastasized to lung [facility]) 09/04/2015    Past Surgical History:  Procedure Laterality Date  . BREAST CYST EXCISION Right 1988  . BUNIONECTOMY Bilateral 2007  . CARDIAC CATHETERIZATION    . CATARACT EXTRACTION W/ INTRAOCULAR LENS  IMPLANT, BILATERAL Bilateral 2009  . CHOLECYSTECTOMY  July 2010  done w/ Whipple procedure  . IR GENERIC HISTORICAL  03/15/2016   IR GUIDED DRAIN W CATHETER PLACEMENT 03/15/2016 Greggory Keen, MD [facility]-INTERV RAD  . SKIN CANCER EXCISION Left    nasal fold  . STAPLING OF BLEBS Left 04/20/2015   Procedure: STAPLING OF BLEBS;  Surgeon: Ivin Poot, MD;  Location: [place];  Service: Thoracic;  Laterality: Left;  . THYROID SURGERY     "had a growth taken off"  . TONSILLECTOMY  1943  . VAGINAL HYSTERECTOMY  1963   Cervical Cancer  . VIDEO ASSISTED THORACOSCOPY Left 04/20/2015   Procedure: VIDEO ASSISTED THORACOSCOPY;  Surgeon: Ivin Poot, MD;  Location: [place];  Service: Thoracic;  Laterality: Left;  . WHIPPLE PROCEDURE  July 2010   pancreatic adenocarcinoma    Family History  Problem Relation Age of Onset  . Pancreatic cancer Brother   . Colon cancer Sister 36       12/2006 dx surgery and chemotherapy  . Ovarian cancer Sister 51       Ovarian ca,  surgery and chemotherapy    Social History   Social History  . Marital status: Widowed    Spouse name: N/A  . Number of children: 0  . Years of education: N/A   Occupational History  .  Retired   Social History Main Topics   . Smoking status: Former Smoker    Packs/day: 1.00    Years: 30.00    Types: Cigarettes    Quit date: 02/29/1996  . Smokeless tobacco: Never Used  . Alcohol use No  . Drug use: No  . Sexual activity: No   Other Topics Concern  . None   Social History Narrative  . None     PHYSICAL EXAMINATION  ECOG PERFORMANCE STATUS: 1 - Symptomatic but completely ambulatory  There were no vitals filed for this visit.  Vitals - 1 value per visit 3/66/2947  SYSTOLIC 654  DIASTOLIC 60  Pulse 59  Temperature 97.8  Respirations 18  Weight (lb) 118.4    GENERAL:alert, no distress, well nourished, well developed, comfortable, cooperative, smiling and in chemo-recliner, accompanied by sister SKIN: skin color, texture, turgor are normal, no rashes or significant lesions HEAD: Normocephalic, No masses, lesions, tenderness or abnormalities EYES: normal, EOMI, Conjunctiva are pink and non-injected EARS: External ears normal OROPHARYNX:lips, buccal mucosa, and tongue normal and mucous membranes are moist  NECK: supple, trachea midline LYMPH:  no palpable lymphadenopathy BREAST:not examined LUNGS: clear to auscultation  HEART: regular rate & rhythm ABDOMEN:abdomen soft and normal bowel sounds BACK: Back symmetric, no curvature. EXTREMITIES:less then 2 second capillary refill, no joint deformities, effusion, or inflammation, no skin discoloration, no cyanosis  NEURO: alert & oriented x 3 with fluent speech, no focal motor/sensory deficits   LABORATORY DATA: CBC    Component Value Date/Time   WBC 6.3 07/19/2016 1005   RBC 3.14 (L) 07/19/2016 1005   HGB 9.7 (L) 07/19/2016 1005   HCT 30.5 (L) 07/19/2016 1005   PLT 152 07/19/2016 1005   MCV 97.1 07/19/2016 1005   MCH 30.9 07/19/2016 1005   MCHC 31.8 07/19/2016 1005   RDW 16.7 (H) 07/19/2016 1005   LYMPHSABS 1.2 07/19/2016 1005   MONOABS 1.0 07/19/2016 1005   EOSABS 0.0 07/19/2016 1005   BASOSABS 0.0 07/19/2016 1005      Chemistry       Component Value Date/Time   NA 137 07/19/2016 1005   K 4.2 07/19/2016 1005   CL 109 07/19/2016 1005  CO2 20 (L) 07/19/2016 1005   BUN 22 (H) 07/19/2016 1005   CREATININE 1.05 (H) 07/19/2016 1005      Component Value Date/Time   CALCIUM 8.8 (L) 07/19/2016 1005   ALKPHOS 101 07/19/2016 1005   AST 15 07/19/2016 1005   ALT 10 (L) 07/19/2016 1005   BILITOT 0.6 07/19/2016 1005     Results for DANISE, DEHNE (MRN [id number]) as of 07/05/2016 08:00  Ref. Range 06/21/2016 09:41  CA 19-9 Latest Ref Range: 0 - 35 U/mL 492 (H)   Lab Results  Component Value Date   VITAMINB12 391 07/05/2016    PENDING LABS:   RADIOGRAPHIC STUDIES:  Dg Chest 2 View  Result Date: 07/20/2016 CLINICAL DATA:  Left lung neoplasm. EXAM: CHEST  2 VIEW COMPARISON:  CT 05/09/2016.  Chest x-ray 04/19/2017 . FINDINGS: PowerPort catheter with lead tip over the superior vena cava. Surgical sutures right chest. Heart size normal. Pleuroparenchymal thickening noted bilaterally most consistent with scarring. Prior talc pleurodesis on the left. Pulmonary densities are present in stable bilaterally. COPD. No pneumothorax P IMPRESSION: 1.  PowerPort catheter stable position. 2. Surgical sutures right chest. Prior talc pleurodesis on the left. Only densities present bilaterally inter stable and unchanged from prior exam. Reference is made to CT report 05/09/2016 for discussion of pulmonary metastatic disease present. Again the chest appears unchanged from prior exams. 3. COPD. Electronically Signed   By: Marcello Moores  Register   On: 07/20/2016 10:17   Ct Head W Wo Contrast  Result Date: 07/26/2016 CLINICAL DATA:  76 year old female with stage IV pancreatic carcinoma. Headaches for 2 weeks. EXAM: CT HEAD WITHOUT AND WITH CONTRAST TECHNIQUE: Contiguous axial images were obtained from the base of the skull through the vertex without and with intravenous contrast CONTRAST:  75 mL Isovue-300 in conjunction with contrast enhanced imaging of  the chest, abdomen, and pelvis reported separately. COMPARISON:  [facility] Brain MRI 11/28/2014. FINDINGS: Brain: Chronic lacunar infarct in the left basal ganglia/ anterior deep white matter capsules is unchanged since 2016. Cerebral volume is not significantly changed. Small probable perivascular spaces at the posterior deep white matter capsules are stable. No midline shift, ventriculomegaly, mass effect, evidence of mass lesion, intracranial hemorrhage or evidence of cortically based acute infarction. No abnormal enhancement identified. Vascular: Calcified atherosclerosis at the skull base. Recannulized right transverse sinus, right sigmoid sinus, and visible right internal jugular vein since the 2016 MRI, although nonocclusive chronic thrombus remains in the right sigmoid and IJ (series 7 images 3 and 8. Other major intracranial vascular structures are normally enhancing. Skull: Hyperostosis of the calvarium common normal variant. Osteopenia. No acute or suspicious osseous lesion identified. Sinuses/Orbits: Visualized paranasal sinuses and mastoids are stable and well pneumatized. Other: Stable and negative visible orbits soft tissues. Visualized scalp soft tissues are within normal limits. IMPRESSION: 1. No metastatic disease or acute intracranial abnormality identified. 2. Recannulized right transverse sinus through right IJ bulb thrombosis since the 2016 MRI. Small volume of residual chronic nonocclusive thrombus. 3. Mild for age chronic small vessel disease is unchanged since 2016. Electronically Signed   By: Genevie Ann M.D.   On: 07/26/2016 08:33   Ct Chest W Contrast  Result Date: 07/26/2016 CLINICAL DATA:  Stage IV pancreatic cancer EXAM: CT CHEST, ABDOMEN, AND PELVIS WITH CONTRAST TECHNIQUE: Multidetector CT imaging of the chest, abdomen and pelvis was performed following the standard protocol during bolus administration of intravenous contrast. CONTRAST:  73m ISOVUE-300 IOPAMIDOL  (ISOVUE-300) INJECTION 61% COMPARISON:  3/19/  18 FINDINGS: CT CHEST FINDINGS Cardiovascular: The heart size appears normal. No pericardial effusion. Aortic atherosclerosis. Calcification in the RCA, LAD and left circumflex coronary artery. Mediastinum/Nodes: 1.8 cm nodule in the posterior right lobe of thyroid gland is identified, image 5 of series 3. The trachea appears patent and is midline. Normal appearance of the esophagus. No mediastinal or hilar adenopathy. No supraclavicular or axillary adenopathy. Lungs/Pleura: Advanced changes of emphysema. Previous left-sided talc pleurodesis. The index right upper lobe subpleural nodule is unchanged measuring 4 mm, image 30 of series 6. The index right middle lobe nodule measures 5 mm and is unchanged from previous exam. The right lower lobe cavitary lung lesion measures 2.6 by 1.0 cm, image 101 of series 6. Unchanged from previous exam. The subpleural cavitary process within the posterior right lower lobe measures 5.8 x 3.6 cm, image 110 of series 6. Also unchanged. There is adjacent trace loculated pleural fluid. Unchanged appearance of cavitary consolidation in the lingula, image 90 of series 6. Posterior left lower lobe cavitary lesion measures 1.6 cm and is unchanged from previous exam, image 111 of series 6. Stable subpleural nodule within the posterolateral left lower lobe 6 mm. Musculoskeletal: Left chest wall lesion involving the anterolateral aspect of the left fifth rib with associated mixed lytic and sclerotic changes is again noted and is stable. The necrotic soft tissue component has decreased in size in the interval measuring 2.2 cm, image 28 of series 3. Previously 2.7 cm. No new or progressive bone lesions identified. CT ABDOMEN PELVIS FINDINGS Hepatobiliary: No focal liver lesion identified. Pneumobilia is identified compatible with biliary patency. Hypertrophy of the lateral segment of left lobe of liver and caudate lobe of liver suggestive of mild  cirrhosis. Pancreas: Status post partial pancreatectomy with atrophy involving the remaining body and tail. Gas is identified within the pancreatic duct confirming patency. Spleen: Normal in size without focal abnormality. Adrenals/Urinary Tract: Nodule in the right adrenal gland measures 1.7 cm, image 55 of series 3. Unchanged from previous exam. Previously described as a benign adenoma. Normal left adrenal gland. No kidney mass or hydronephrosis. Simple appearing cyst arises from the lower pole the left kidney. Urinary bladder appears normal. Stomach/Bowel: Previous gastrojejunostomy. Ventral abdominal wall hernia is again noted containing nonobstructed loops of small bowel. Vascular/Lymphatic: Aortic atherosclerosis. Infrarenal abdominal aortic aneurysm measures 3.7 cm, image 66 of series 3. No upper abdominal adenopathy. No enlarged pelvic or inguinal adenopathy. Increase soft tissue encasing the superior mesenteric artery and extending into the aortocaval space is noted. This measures 2.7 x 1.6 cm, image 62 of series 3. Unchanged from previous exam. Is again identified, image 58 of series 3. Reproductive: Status post hysterectomy. No adnexal masses. Other: No free fluid or fluid collection. 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 No aggressive lytic or sclerotic bone lesions identified. IMPRESSION: 1. Stable appearance of pulmonary metastases. 2. No thoracic adenopathy. 3. 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 4. Status post Whipple procedure. 5. Similar soft tissue within the retroperitoneum involving the SMA and aorto caval space. 6. Pneumobilia and possible mild cirrhosis. 7. Ventral abdominal wall hernia containing nonobstructed loops of small bowel. 8. Abdominal aortic aneurysm measures 3.7 cm. Unchanged from previous exam. 9. Aortic Atherosclerosis (ICD10-I70.0) and Emphysema (ICD10-J43.9). Coronary artery calcifications noted. Electronically Signed    By: Kerby Moors M.D.   On: 07/26/2016 10:28   Ct Abdomen Pelvis W Contrast  Result Date: 07/26/2016 CLINICAL DATA:  Stage IV pancreatic cancer EXAM: CT CHEST,  ABDOMEN, AND PELVIS WITH CONTRAST TECHNIQUE: Multidetector CT imaging of the chest, abdomen and pelvis was performed following the standard protocol during bolus administration of intravenous contrast. CONTRAST:  77m ISOVUE-300 IOPAMIDOL (ISOVUE-300) INJECTION 61% COMPARISON:  3/19/ 18 FINDINGS: CT CHEST FINDINGS Cardiovascular: The heart size appears normal. No pericardial effusion. Aortic atherosclerosis. Calcification in the RCA, LAD and left circumflex coronary artery. Mediastinum/Nodes: 1.8 cm nodule in the posterior right lobe of thyroid gland is identified, image 5 of series 3. The trachea appears patent and is midline. Normal appearance of the esophagus. No mediastinal or hilar adenopathy. No supraclavicular or axillary adenopathy. Lungs/Pleura: Advanced changes of emphysema. Previous left-sided talc pleurodesis. The index right upper lobe subpleural nodule is unchanged measuring 4 mm, image 30 of series 6. The index right middle lobe nodule measures 5 mm and is unchanged from previous exam. The right lower lobe cavitary lung lesion measures 2.6 by 1.0 cm, image 101 of series 6. Unchanged from previous exam. The subpleural cavitary process within the posterior right lower lobe measures 5.8 x 3.6 cm, image 110 of series 6. Also unchanged. There is adjacent trace loculated pleural fluid. Unchanged appearance of cavitary consolidation in the lingula, image 90 of series 6. Posterior left lower lobe cavitary lesion measures 1.6 cm and is unchanged from previous exam, image 111 of series 6. Stable subpleural nodule within the posterolateral left lower lobe 6 mm. Musculoskeletal: Left chest wall lesion involving the anterolateral aspect of the left fifth rib with associated mixed lytic and sclerotic changes is again noted and is stable. The necrotic  soft tissue component has decreased in size in the interval measuring 2.2 cm, image 28 of series 3. Previously 2.7 cm. No new or progressive bone lesions identified. CT ABDOMEN PELVIS FINDINGS Hepatobiliary: No focal liver lesion identified. Pneumobilia is identified compatible with biliary patency. Hypertrophy of the lateral segment of left lobe of liver and caudate lobe of liver suggestive of mild cirrhosis. Pancreas: Status post partial pancreatectomy with atrophy involving the remaining body and tail. Gas is identified within the pancreatic duct confirming patency. Spleen: Normal in size without focal abnormality. Adrenals/Urinary Tract: Nodule in the right adrenal gland measures 1.7 cm, image 55 of series 3. Unchanged from previous exam. Previously described as a benign adenoma. Normal left adrenal gland. No kidney mass or hydronephrosis. Simple appearing cyst arises from the lower pole the left kidney. Urinary bladder appears normal. Stomach/Bowel: Previous gastrojejunostomy. Ventral abdominal wall hernia is again noted containing nonobstructed loops of small bowel. Vascular/Lymphatic: Aortic atherosclerosis. Infrarenal abdominal aortic aneurysm measures 3.7 cm, image 66 of series 3. No upper abdominal adenopathy. No enlarged pelvic or inguinal adenopathy. Increase soft tissue encasing the superior mesenteric artery and extending into the aortocaval space is noted. This measures 2.7 x 1.6 cm, image 62 of series 3. Unchanged from previous exam. Is again identified, image 58 of series 3. Reproductive: Status post hysterectomy. No adnexal masses. Other: No free fluid or fluid collection. 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 No aggressive lytic or sclerotic bone lesions identified. IMPRESSION: 1. Stable appearance of pulmonary metastases. 2. No thoracic adenopathy. 3. 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 4. Status post Whipple procedure. 5. Similar soft  tissue within the retroperitoneum involving the SMA and aorto caval space. 6. Pneumobilia and possible mild cirrhosis. 7. Ventral abdominal wall hernia containing nonobstructed loops of small bowel. 8. Abdominal aortic aneurysm measures 3.7 cm. Unchanged from previous exam. 9. Aortic Atherosclerosis (ICD10-I70.0) and Emphysema (ICD10-J43.9). Coronary  artery calcifications noted. Electronically Signed   By: Kerby Moors M.D.   On: 07/26/2016 10:28     PATHOLOGY:    ASSESSMENT AND PLAN:  No problem-specific Assessment & Plan notes found for this encounter.   Number of Diagnoses or Treatment Options- Section A:      Problems to Exam Physician Problem(s) Number x Points= Results  Self-limited or minor (stable, improved, or worsening)  Max='2  1 1  ' Est. Problem (to examiner); stable, improved   1 2  Est. Problem (to examiner); worsening   2   New problem (to examiner); no additional work-up planned  Max=1 3   New problem (to examiner); add. work-up planned   4 4     Total: 7    Amount and/or Complexity of Data to be Reviewed- Section B    Data to be reviewed: Points    Review and/or order of clinical lab tests 1  '[x]'    Review and/or order of tests in the radiology section of CPT (includes nuclear med & other except cardiac cath & ECG) 1  '[x]'    Review and/or order of tests in the medicine section of CPT (e.g. EKG, cardiac cath, non-invasive vascular studies, pulmonary function studies) 1  '[]'    Discussion of test results with performing physician 1  '[]'    Decision to obtain old records and/or obtaining history from someone other than patient 1  '[]'    Review and summarization of old records and/or obtaining history from someone other than patient and/or discussion of case with another health care provider 2  '[x]'    Independent visualization of image, tracing, or specimen itself (not simply review report) 2  '[]'    Total:   4    Risk of  complications and/or Morbidity or Mortality- Section C    Level of Risk: Presenting Problem(s) Diagnostic Procedure(s) Ordered Management Options Selected  Minimal One self-limited or minor problem (eg cold, insect bite, tinea corporis)  Lab test requiring venipuncture  Chest xray  EKG/EEG  [place] or Echo  KOH prep  Urinalysis  Rest  Gargles  Elastic bandages  Superficial dressings  Low  Two or more self-limited or minor problems  One stable chronic illness (well-controlled HTN, non-insulin dependent diabetes, cataract, BPH)  Acute uncomplicated illness or injury (cystitis, allergic rhinitis, simple sprain)  Physiologic test not under stress (pulm fnx tests)  Non-cardiovascular imaging studies with contrast (barium enema)  Superficial needle biopsy  Clinical laboratory tests requiring arterial puncture  Skin biopsies  OTC drugs  Minor surgery with no identified risk factors  Physical therapy  Occupational therapy  IV fluids without additives  Moderate  One or more chronic illnesses with mild exacerbation, progression, or side effects of treatment  Two or more stable chronic illnesses  Undiagnosed new problem with uncertain prognosis (lump in breast)  Acute illness with systemic symptoms (pyelonephritis, pneumonitis, colitis)  Acute complicated injury (head injury with brief loss of consciousness)  Physiologic test under stress (cardiac stress test, fetal contraction stress test)  Diagnostic endoscopies with no identified risk factors  Deep needle or incisional biopsy  Cardiovascular imaging studies with contrast and no identified risk factors (arteriogram, cardiac cath)  Obtain fluid from body cavity (LP, thoracentesis, culdocentesis)  Minor surgery with identified risk factors  Elective surgery (open, percutaneous, or endoscopic) with no identified risk factors  Prescription drug management  Therapeutic nuclear medicine  IV fluids with additives  Closed treatment of fracture of dislocation without  manipulation  High  One or  more chronic illnesses with severe exacerbation, progression, or side effects of treatment.  Acute or chronic illnesses or injuries that may pose a threat to life or bodily function (multiple trauma, acute MI, PE, severe respiratory distress, progressive severe rheumatoid arthritis, psychiatric illness with potential threat to self or others, peritonitis, acute renal failure)  An abrupt change in neurological status (seizure, TIA, weakness, sensory loss)  Cardiovascular imaging studies with contrast with identified risk factors  Cardiac electrophysiological tests  Diagnostic endoscopies with identified risk factors  Discography   Elective major surgery (open, percutaneous, or endoscopic) with identified risk factor  Emergency major surgery (open, percutaneous, or endoscopic)  Parental controlled substances  Drug therapy requiring intensive monitoring for toxicity  Decision not to resuscitate or deescalate care because of poor prognosis     Final Result of Complexity      Choose decision making level with 2 or 3 checks OR choose the decision making level on Section B       A Number of diagnoses or treatment options  '[]'   </= 1 Minimal  '[]'   2 Limited  '[]'   3 Multiple  '[x]'   >/= 4 Extensive  B Amount and complexity of data  '[]'   </= 1 Minimal or low  '[]'   2 Limited  '[]'   3  Moderate  '[x]'   >/= 4 Extensive  C Highest risk  '[]'   Minimal  '[]'   Low  '[]'   Moderate  '[x]'   High   Type of decision making  '[]'   Straight-forward  '[]'   Low Complexity  '[]'   Moderate- Complexity  '[x]'   High- Complexity     ORDERS PLACED FOR THIS ENCOUNTER: No orders of the defined types were placed in this encounter.   MEDICATIONS PRESCRIBED THIS ENCOUNTER: Meds ordered this encounter  Medications  . cholecalciferol (VITAMIN D) 1000 units tablet    Sig: Take 2,000 Units by mouth daily.    THERAPY PLAN:  Continue with palliative chemotherapy.  CT scan of chest  abdomen and head has been reviewed independently.  Findings were discussed with the patient. Metastases disease appears to be stable No evidence of any new areas of metastases disease Continue chemotherapy All lab data has been reviewed CD19-9 is pending  This note is electronically signed by: Forest Gleason, MD 08/02/2016 9:04 AM

## 2016-08-02 NOTE — Patient Instructions (Addendum)
[facility] Discharge Instructions for Patients Receiving Chemotherapy  If you have a lab appointment with the [facility] please come in thru the  Main Entrance and check in at the main information desk   Today you received the following chemotherapy agents: irinotecan liposome, leucovorin and 5-flurourocil.  To help prevent nausea and vomiting after your treatment, we encourage you to take your nausea medication as prescribed.   If you develop nausea and vomiting, or diarrhea that is not controlled by your medication, call the clinic.  The clinic phone number is [phone]. Office hours are Monday-Friday 8:30am-5:00pm.  BELOW ARE SYMPTOMS THAT SHOULD BE REPORTED IMMEDIATELY:  *FEVER GREATER THAN 101.0 F  *CHILLS WITH OR WITHOUT FEVER  NAUSEA AND VOMITING THAT IS NOT CONTROLLED WITH YOUR NAUSEA MEDICATION  *UNUSUAL SHORTNESS OF BREATH  *UNUSUAL BRUISING OR BLEEDING  TENDERNESS IN MOUTH AND THROAT WITH OR WITHOUT PRESENCE OF ULCERS  *URINARY PROBLEMS  *BOWEL PROBLEMS  UNUSUAL RASH Items with * indicate a potential emergency and should be followed up as soon as possible. If you have an emergency after office hours please contact your primary care physician or go to the nearest emergency department.  Please call the clinic during office hours if you have any questions or concerns.   You may also contact the Patient Navigator at [phone] should you have any questions or need assistance in obtaining follow up care.      Resources For Cancer Patients and their Caregivers ? American Cancer Society: Can assist with transportation, wigs, general needs, runs Look Good Feel Better.        509-147-1708 ? Cancer Care: Provides financial assistance, online support groups, medication/co-pay assistance.  1-800-813-HOPE 873-152-5459) ? [place] Assists [place] cancer patients and their families through emotional , educational  and financial support.  403-096-3741 ? [place] DSS Where to apply for food stamps, Medicaid and utility assistance. 408-346-9931 ? [employer]: Transportation to medical appointments. 901-822-8220 ? Social Security Administration: May apply for disability if have a Stage IV cancer. [phone] [phone] ? [employer], Disability and Transit Services: Assists with nutrition, care and transit needs. [phone]

## 2016-08-03 LAB — CANCER ANTIGEN 19-9: CA 19-9: 830 U/mL — ABNORMAL HIGH (ref 0–35)

## 2016-08-04 ENCOUNTER — Encounter (HOSPITAL_BASED_OUTPATIENT_CLINIC_OR_DEPARTMENT_OTHER): Payer: Medicare Other

## 2016-08-04 ENCOUNTER — Encounter (HOSPITAL_COMMUNITY): Payer: Self-pay

## 2016-08-04 VITALS — BP 122/59 | HR 66 | Temp 98.0°F | Resp 18

## 2016-08-04 DIAGNOSIS — C7800 Secondary malignant neoplasm of unspecified lung: Secondary | ICD-10-CM | POA: Diagnosis not present

## 2016-08-04 DIAGNOSIS — C259 Malignant neoplasm of pancreas, unspecified: Secondary | ICD-10-CM

## 2016-08-04 MED ORDER — SODIUM CHLORIDE 0.9% FLUSH
10.0000 mL | INTRAVENOUS | Status: DC | PRN
  Administered 2016-08-04: 10 mL
  Filled 2016-08-04: qty 10

## 2016-08-04 MED ORDER — HEPARIN SOD (PORK) LOCK FLUSH 100 UNIT/ML IV SOLN
500.0000 [IU] | Freq: Once | INTRAVENOUS | Status: AC | PRN
  Administered 2016-08-04: 500 [IU]

## 2016-08-04 NOTE — Progress Notes (Signed)
.  [name] [name] tolerated discontinuation of 5FU pump with port flushed well without complaints or incident.Port flushed with 10 ml NS and 5 ml Heparin easily per protocol then de-accessed. VSS Pt discharged self ambulatory in satisfactory condition

## 2016-08-04 NOTE — Patient Instructions (Signed)
[place] at [facility] Discharge Instructions  RECOMMENDATIONS MADE BY THE CONSULTANT AND ANY TEST RESULTS WILL BE SENT TO YOUR REFERRING PHYSICIAN.  5FU pump discontinued with port flushed per protocol. Follow-up as scheduled. Call clinic for any questions or concerns  Thank you for choosing [place] at [facility] to provide your oncology and hematology care.  To afford each patient quality time with our provider, please arrive at least 15 minutes before your scheduled appointment time.    If you have a lab appointment with the [facility] please come in thru the  Main Entrance and check in at the main information desk  You need to re-schedule your appointment should you arrive 10 or more minutes late.  We strive to give you quality time with our providers, and arriving late affects you and other patients whose appointments are after yours.  Also, if you no show three or more times for appointments you may be dismissed from the clinic at the providers discretion.     Again, thank you for choosing [facility].  Our hope is that these requests will decrease the amount of time that you wait before being seen by our physicians.       _____________________________________________________________  Should you have questions after your visit to [facility], please contact our office at [phone] between the hours of 8:30 a.m. and 4:30 p.m.  Voicemails left after 4:30 p.m. will not be returned until the following business day.  For prescription refill requests, have your pharmacy contact our office.       Resources For Cancer Patients and their Caregivers ? American Cancer Society: Can assist with transportation, wigs, general needs, runs Look Good Feel Better.        302-483-0472 ? Cancer Care: Provides financial assistance, online support groups, medication/co-pay assistance.  1-800-813-HOPE 234-230-1094) ? [place] Assists [place] cancer patients and their families through emotional , educational and financial support.  848-791-1049 ? [place] DSS Where to apply for food stamps, Medicaid and utility assistance. 442-063-1935 ? [employer]: Transportation to medical appointments. 9714747584 ? Social Security Administration: May apply for disability if have a Stage IV cancer. [phone] 682-669-4107 ? [employer], Disability and Transit Services: Assists with nutrition, care and transit needs. [place] Support Programs: @10RELATIVEDAYS @ > Cancer Support Group  2nd Tuesday of the month 1pm-2pm, Journey Room  > Creative Journey  3rd Tuesday of the month 1130am-1pm, Journey Room  > Look Good Feel Better  1st Wednesday of the month 10am-12 noon, Journey Room (Call [place] to register [phone])

## 2016-08-10 ENCOUNTER — Other Ambulatory Visit (HOSPITAL_COMMUNITY): Payer: Self-pay

## 2016-08-16 ENCOUNTER — Encounter (HOSPITAL_BASED_OUTPATIENT_CLINIC_OR_DEPARTMENT_OTHER): Payer: Medicare Other

## 2016-08-16 ENCOUNTER — Encounter (HOSPITAL_COMMUNITY): Payer: Self-pay | Admitting: Adult Health

## 2016-08-16 ENCOUNTER — Encounter (HOSPITAL_BASED_OUTPATIENT_CLINIC_OR_DEPARTMENT_OTHER): Payer: Medicare Other | Admitting: Adult Health

## 2016-08-16 VITALS — BP 117/47 | HR 56 | Temp 97.5°F | Resp 18 | Wt 118.6 lb

## 2016-08-16 DIAGNOSIS — R197 Diarrhea, unspecified: Secondary | ICD-10-CM | POA: Diagnosis not present

## 2016-08-16 DIAGNOSIS — G620 Drug-induced polyneuropathy: Secondary | ICD-10-CM

## 2016-08-16 DIAGNOSIS — C7800 Secondary malignant neoplasm of unspecified lung: Secondary | ICD-10-CM

## 2016-08-16 DIAGNOSIS — K1230 Oral mucositis (ulcerative), unspecified: Secondary | ICD-10-CM

## 2016-08-16 DIAGNOSIS — I8393 Asymptomatic varicose veins of bilateral lower extremities: Secondary | ICD-10-CM | POA: Diagnosis not present

## 2016-08-16 DIAGNOSIS — E538 Deficiency of other specified B group vitamins: Secondary | ICD-10-CM | POA: Diagnosis not present

## 2016-08-16 DIAGNOSIS — C259 Malignant neoplasm of pancreas, unspecified: Secondary | ICD-10-CM

## 2016-08-16 DIAGNOSIS — R252 Cramp and spasm: Secondary | ICD-10-CM

## 2016-08-16 DIAGNOSIS — K649 Unspecified hemorrhoids: Secondary | ICD-10-CM | POA: Diagnosis not present

## 2016-08-16 DIAGNOSIS — K521 Toxic gastroenteritis and colitis: Secondary | ICD-10-CM | POA: Diagnosis not present

## 2016-08-16 DIAGNOSIS — D6481 Anemia due to antineoplastic chemotherapy: Secondary | ICD-10-CM | POA: Diagnosis not present

## 2016-08-16 DIAGNOSIS — Z95828 Presence of other vascular implants and grafts: Secondary | ICD-10-CM | POA: Diagnosis not present

## 2016-08-16 DIAGNOSIS — Z5111 Encounter for antineoplastic chemotherapy: Secondary | ICD-10-CM | POA: Diagnosis not present

## 2016-08-16 LAB — COMPREHENSIVE METABOLIC PANEL
ALK PHOS: 107 U/L (ref 38–126)
ALT: 13 U/L — ABNORMAL LOW (ref 14–54)
ANION GAP: 7 (ref 5–15)
AST: 23 U/L (ref 15–41)
Albumin: 3.4 g/dL — ABNORMAL LOW (ref 3.5–5.0)
BUN: 21 mg/dL — ABNORMAL HIGH (ref 6–20)
CO2: 22 mmol/L (ref 22–32)
Calcium: 8.7 mg/dL — ABNORMAL LOW (ref 8.9–10.3)
Chloride: 108 mmol/L (ref 101–111)
Creatinine, Ser: 1.06 mg/dL — ABNORMAL HIGH (ref 0.44–1.00)
GFR calc non Af Amer: 49 mL/min — ABNORMAL LOW (ref >60)
GFR, EST AFRICAN AMERICAN: 57 mL/min — AB (ref >60)
Glucose, Bld: 122 mg/dL — ABNORMAL HIGH (ref 65–99)
POTASSIUM: 4.2 mmol/L (ref 3.5–5.1)
Sodium: 137 mmol/L (ref 135–145)
TOTAL PROTEIN: 6.4 g/dL — AB (ref 6.5–8.1)
Total Bilirubin: 0.7 mg/dL (ref 0.3–1.2)

## 2016-08-16 LAB — CBC WITH DIFFERENTIAL/PLATELET
BASOS PCT: 0 %
Basophils Absolute: 0 10*3/uL (ref 0.0–0.1)
EOS ABS: 0 10*3/uL (ref 0.0–0.7)
Eosinophils Relative: 1 %
HEMATOCRIT: 33.2 % — AB (ref 36.0–46.0)
HEMOGLOBIN: 10.4 g/dL — AB (ref 12.0–15.0)
LYMPHS ABS: 1.1 10*3/uL (ref 0.7–4.0)
Lymphocytes Relative: 18 %
MCH: 31 pg (ref 26.0–34.0)
MCHC: 31.3 g/dL (ref 30.0–36.0)
MCV: 98.8 fL (ref 78.0–100.0)
MONO ABS: 0.7 10*3/uL (ref 0.1–1.0)
Monocytes Relative: 13 %
NEUTROS ABS: 4 10*3/uL (ref 1.7–7.7)
NEUTROS PCT: 68 %
Platelets: 143 10*3/uL — ABNORMAL LOW (ref 150–400)
RBC: 3.36 MIL/uL — ABNORMAL LOW (ref 3.87–5.11)
RDW: 16.5 % — AB (ref 11.5–15.5)
WBC: 5.8 10*3/uL (ref 4.0–10.5)

## 2016-08-16 MED ORDER — PALONOSETRON HCL INJECTION 0.25 MG/5ML
0.2500 mg | Freq: Once | INTRAVENOUS | Status: AC
  Administered 2016-08-16: 0.25 mg via INTRAVENOUS
  Filled 2016-08-16: qty 5

## 2016-08-16 MED ORDER — SODIUM CHLORIDE 0.9 % IV SOLN
35.0000 mg/m2 | Freq: Once | INTRAVENOUS | Status: AC
  Administered 2016-08-16: 55.9 mg via INTRAVENOUS
  Filled 2016-08-16: qty 13

## 2016-08-16 MED ORDER — SODIUM CHLORIDE 0.9 % IV SOLN
Freq: Once | INTRAVENOUS | Status: AC
  Administered 2016-08-16: 10:00:00 via INTRAVENOUS

## 2016-08-16 MED ORDER — LEUCOVORIN CALCIUM INJECTION 350 MG
400.0000 mg/m2 | Freq: Once | INTRAVENOUS | Status: AC
  Administered 2016-08-16: 652 mg via INTRAVENOUS
  Filled 2016-08-16: qty 32.6

## 2016-08-16 MED ORDER — MAGIC MOUTHWASH W/LIDOCAINE: 5.0000 mL | Freq: Four times a day (QID) | ORAL | 3 refills | Status: DC | PRN

## 2016-08-16 MED ORDER — PROCHLORPERAZINE MALEATE 10 MG PO TABS
10.0000 mg | ORAL_TABLET | Freq: Once | ORAL | Status: AC
  Administered 2016-08-16: 10 mg via ORAL
  Filled 2016-08-16: qty 1

## 2016-08-16 MED ORDER — FLUOROURACIL CHEMO INJECTION 5 GM/100ML
1500.0000 mg/m2 | INTRAVENOUS | Status: DC
  Administered 2016-08-16: 2450 mg via INTRAVENOUS
  Filled 2016-08-16: qty 49

## 2016-08-16 MED ORDER — LIDOCAINE VISCOUS 2 % MT SOLN: 20.0000 mL | OROMUCOSAL | 3 refills | Status: DC | PRN

## 2016-08-16 MED ORDER — SODIUM CHLORIDE 0.9% FLUSH
10.0000 mL | INTRAVENOUS | Status: DC | PRN
  Administered 2016-08-16: 10 mL
  Filled 2016-08-16: qty 10

## 2016-08-16 NOTE — Progress Notes (Signed)
Oluwateniola M Slomski tolerated chemo tx well without complaints or incident. Labs reviewed with Dr Talbert Cage prior to administering chemotherapy. Hgb 10.4 today so Aranesp injection was held.Pt recently has felt like the inside of her mouth is swollen and she has bitten her jaw and tongue. Mike Craze NP assessed these areas and script was given for magic mouthwash. Pt was discharged with 5FU pump infusing without issues. VSS upon discharge. Pt discharged self ambulatory in satisfactory condition accompanied by her sister

## 2016-08-16 NOTE — Patient Instructions (Addendum)
[facility] Discharge Instructions for Patients Receiving Chemotherapy   Beginning January 23rd 2017 lab work for the [facility] will be done in the  Main lab at [facility] on 1st floor. If you have a lab appointment with the [facility] please come in thru the  Main Entrance and check in at the main information desk   Today you received the following chemotherapy agents Irinotecan Liposome,Leucovorin and 5FU. Follow-up as scheduled. Call clinic for any questions or concerns  To help prevent nausea and vomiting after your treatment, we encourage you to take your nausea medication   If you develop nausea and vomiting, or diarrhea that is not controlled by your medication, call the clinic.  The clinic phone number is [phone]. Office hours are Monday-Friday 8:30am-5:00pm.  BELOW ARE SYMPTOMS THAT SHOULD BE REPORTED IMMEDIATELY:  *FEVER GREATER THAN 101.0 F  *CHILLS WITH OR WITHOUT FEVER  NAUSEA AND VOMITING THAT IS NOT CONTROLLED WITH YOUR NAUSEA MEDICATION  *UNUSUAL SHORTNESS OF BREATH  *UNUSUAL BRUISING OR BLEEDING  TENDERNESS IN MOUTH AND THROAT WITH OR WITHOUT PRESENCE OF ULCERS  *URINARY PROBLEMS  *BOWEL PROBLEMS  UNUSUAL RASH Items with * indicate a potential emergency and should be followed up as soon as possible. If you have an emergency after office hours please contact your primary care physician or go to the nearest emergency department.  Please call the clinic during office hours if you have any questions or concerns.   You may also contact the Patient Navigator at [phone] should you have any questions or need assistance in obtaining follow up care.      Resources For Cancer Patients and their Caregivers ? American Cancer Society: Can assist with transportation, wigs, general needs, runs Look Good Feel Better.        [phone] ? Cancer Care: Provides financial assistance, online support groups, medication/co-pay  assistance.  1-800-813-HOPE 617-400-5413) ? [place] Assists [place] cancer patients and their families through emotional , educational and financial support.  817-865-8479 ? [place] DSS Where to apply for food stamps, Medicaid and utility assistance. 332-552-2660 ? [employer]: Transportation to medical appointments. [phone] ? Social Security Administration: May apply for disability if have a Stage IV cancer. [phone] ? [employer], Disability and Transit Services: Assists with nutrition, care and transit needs. [phone]

## 2016-08-16 NOTE — Patient Instructions (Signed)
[place] at [facility] Discharge Instructions  RECOMMENDATIONS MADE BY THE CONSULTANT AND ANY TEST RESULTS WILL BE SENT TO YOUR REFERRING PHYSICIAN.  You were seen today by Mike Craze NP. Continue treatment  every 2 weeks.    Return in 1 month for follow up .  Thank you for choosing [place] at [facility] to provide your oncology and hematology care.  To afford each patient quality time with our provider, please arrive at least 15 minutes before your scheduled appointment time.    If you have a lab appointment with the [facility] please come in thru the  Main Entrance and check in at the main information desk  You need to re-schedule your appointment should you arrive 10 or more minutes late.  We strive to give you quality time with our providers, and arriving late affects you and other patients whose appointments are after yours.  Also, if you no show three or more times for appointments you may be dismissed from the clinic at the providers discretion.     Again, thank you for choosing [facility].  Our hope is that these requests will decrease the amount of time that you wait before being seen by our physicians.       _____________________________________________________________  Should you have questions after your visit to [facility], please contact our office at [phone] between the hours of 8:30 a.m. and 4:30 p.m.  Voicemails left after 4:30 p.m. will not be returned until the following business day.  For prescription refill requests, have your pharmacy contact our office.       Resources For Cancer Patients and their Caregivers ? American Cancer Society: Can assist with transportation, wigs, general needs, runs Look Good Feel Better.        ([phone] ? Cancer Care: Provides financial assistance, online support groups, medication/co-pay assistance.  1-800-813-HOPE [phone]) ? [place] Assists [place] cancer patients and their families through emotional , educational and financial support.  ([phone] ? [place] DSS Where to apply for food stamps, Medicaid and utility assistance. 332-640-3820 ? [employer]: Transportation to medical appointments. (703)088-6072 ? Social Security Administration: May apply for disability if have a Stage IV cancer. [phone] 251-446-0812 ? [employer], Disability and Transit Services: Assists with nutrition, care and transit needs. [place] Support Programs: @10RELATIVEDAYS @ > Cancer Support Group  2nd Tuesday of the month 1pm-2pm, Journey Room  > Creative Journey  3rd Tuesday of the month 1130am-1pm, Journey Room  > Look Good Feel Better  1st Wednesday of the month 10am-12 noon, Journey Room (Call [place] to register [phone])

## 2016-08-16 NOTE — Progress Notes (Signed)
 [facility] [address] [place], [place] [address]   CLINIC:  Medical Oncology/Hematology  PCP:  Shah, Ashish, MD [address] [place] [address] [phone]   REASON FOR VISIT:  Follow-up for Stage IV adenocarcinoma of pancreas with lung mets   CURRENT THERAPY: 5-FU/Leucovorin/Liposomal Irinotecan, resumed 04/26/16 (palliative treatment)   BRIEF ONCOLOGIC HISTORY:    Pancreatic cancer metastasized to lung (HCC)   09/03/2008 Surgery    Whipple with Dr. Russell Howerton for T3N1 3/12 LN+ Pancreatic Adenocarcinoma      11/13/2008 - 09/12/2009 Chemotherapy    Adjuvant Gemcitabine X 6 months      08/31/2009 Surgery    Metastectomy with Dr. Burney RUL 2 nodules, RLL (wedge resection X 2)      08/31/2009 Pathology Results    Metastatic adenocarcinoma TTF-1 negative      06/03/2010 Surgery    Left video-assisted thoracoscopic surgery, resection of left lower lobe lesion. with Dr. Burney      06/04/2010 Pathology Results    adenocarcinoma positive for cytokeratin 7, focally positive for CDX2, with scattered staining for TTF1 cytokeratin 20. [facility] opinion felt to be lung primary although noted to stain for GI markers, not felt to be pancreatic primary. EGFR and ALK negative      03/31/2011 - 09/04/2011 Radiation Therapy    SBRT LUL 18Gy Dr. Manning      03/15/2012 - 06/13/2012 Chemotherapy    5-FU based chemotherapy      02/13/2013 - 05/20/2013 Chemotherapy    Gemzar Abraxane for metastatic disease (given to cover both primaries at [facility])      07/15/2014 PET scan    Mild progression of pulm mets, hypermet mesenteric tumor assoc with  SMA is stable to slightly increased in degree of FDG uptake      07/23/2014 Imaging         07/30/2014 - 09/10/2014 Chemotherapy    Gemzar abraxane stopped due to poor tolerance      04/06/2015 Procedure    L 20 French chest tube for spontaneous pneumothorax, by Dr. Peter Van Trigt      04/20/2015 Pathology Results   High grade carcinoma c/w patient's known pancreatic primary (secondary opinion at [facility]) PDL-1 high expression, preserved major and minor MMR, MSI stable, Foundation ONE testing performed,       04/20/2015 Surgery    Left VATS, mini thoracotomy with stapling and oversewing of blebs, Talc pleurodesis. performed secondary to persistent air leak not improved with chest tube drainage      07/29/2015 Imaging    No signif change in appear of multifocal B cavitary pulmonary lesions, postop appearance upper abdomen, no findings of met disease to abd or pelvis,      09/29/2015 Imaging    Slight progression of multifocal cavitary pulmonary metastases, as discussed above. 2. Interval development of a lytic lesion in the antral lateral aspect of the left fifth rib where there is a nondisplaced pathologic fracture. No other definite osseous metastases are identified. 3. Prominent soft tissue adjacent to the proximal superior mesenteric artery and in the aortocaval nodal station, similar to prior examinations, likely to represent treated metastatic lymphadenopathy. 4. Epigastric ventral hernia containing several short loops of small bowel. There is a focally dilated loop of small bowel adjacent to this, however, this is an isolated finding. No other dilatation of small bowel or colon to suggest frank bowel obstruction at this time.       10/19/2015 - 03/01/2016 Chemotherapy      The patient had palonosetron (ALOXI) injection 0.25 mg, 0.25 mg, Intravenous,  Once, 10 of 12 cycles  leucovorin 652 mg in dextrose 5 % 250 mL infusion, 400 mg/m2 = 652 mg, Intravenous,  Once, 10 of 12 cycles  fluorouracil (ADRUCIL) 2,950 mg in sodium chloride 0.9 % 91 mL chemo infusion, 1,800 mg/m2 = 2,950 mg (100 % of original dose 1,800 mg/m2), Intravenous, 1 Day/Dose, 10 of 12 cycles Dose modification: 1,920 mg/m2 (original dose 1,800 mg/m2, Cycle 1, Reason: Provider Judgment), 1,800 mg/m2 (original dose  1,800 mg/m2, Cycle 1, Reason: Provider Judgment), 1,500 mg/m2 (original dose 1,800 mg/m2, Cycle 2, Reason: Dose not tolerated)  irinotecan LIPOSOME (ONIVYDE) 55.9 mg in sodium chloride 0.9 % 500 mL chemo infusion, 35 mg/m2 = 55.9 mg (100 % of original dose 35 mg/m2), Intravenous, Once, 10 of 12 cycles Dose modification: 35 mg/m2 (original dose 35 mg/m2, Cycle 1, Reason: Provider Judgment)  for chemotherapy treatment.        01/12/2016 Imaging    CT CAP- 1. Cavitary lung metastases are stable to decreased in size. 2. Expansile mixed lytic and sclerotic bone metastasis in the anterior left fifth rib is increased in sclerosis and mildly increased in size. 3. Infiltrative soft tissue in the retroperitoneum abutting the SMA is stable. 4. No new sites of metastatic disease in the chest, abdomen or pelvis.      03/05/2016 Procedure    Chest tube placed for spontaneous pneumothorax by Dr. Cyndia Bent      03/05/2016 - 03/10/2016 Hospital Admission    Admit date: 03/05/2016 Admission diagnosis: Spontenous pneumothorax Additional comments: chest tube placed by Dr. Cyndia Bent on 03/05/2016      03/14/2016 - 03/18/2016 Hospital Admission    Admit date: 03/14/2016 Admission diagnosis: Recurrent spontaneous pneumothorax Additional comments:       04/26/2016 -  Chemotherapy    The patient had palonosetron (ALOXI) injection 0.25 mg, 0.25 mg, Intravenous,  Once, 10 of 12 cycles  leucovorin 652 mg in dextrose 5 % 250 mL infusion, 400 mg/m2 = 652 mg, Intravenous,  Once, 10 of 12 cycles  fluorouracil (ADRUCIL) 2,950 mg in sodium chloride 0.9 % 91 mL chemo infusion, 1,800 mg/m2 = 2,950 mg (100 % of original dose 1,800 mg/m2), Intravenous, 1 Day/Dose, 10 of 12 cycles Dose modification: 1,920 mg/m2 (original dose 1,800 mg/m2, Cycle 1, Reason: Provider Judgment), 1,800 mg/m2 (original dose 1,800 mg/m2, Cycle 1, Reason: Provider Judgment), 1,500 mg/m2 (original dose 1,800 mg/m2, Cycle 2, Reason: Dose not  tolerated)  irinotecan LIPOSOME (ONIVYDE) 55.9 mg in sodium chloride 0.9 % 500 mL chemo infusion, 35 mg/m2 = 55.9 mg (100 % of original dose 35 mg/m2), Intravenous, Once, 10 of 12 cycles Dose modification: 35 mg/m2 (original dose 35 mg/m2, Cycle 1, Reason: Provider Judgment)  for chemotherapy treatment.        05/09/2016 Imaging    CT CAP- CT CHEST IMPRESSION  1. Mild progression of pulmonary metastasis. Although the majority of cavitary lung lesions are primarily similar, there are new and increased small cavitary nodules. 2. Trace residual loculated inferolateral right pleural air. 3. No thoracic adenopathy. 4. Progression of anterior left fifth rib metastasis with soft tissue component surrounding.  CT ABDOMEN AND PELVIS IMPRESSION  1. Status post Whipple procedure, without locally recurrent disease. 2. Similar soft tissue fullness within the retroperitoneum. 3. Pneumobilia. Possible mild cirrhosis. 4. Similar small bowel containing ventral abdominal wall laxity. 5. Similar abdominal aortic aneurysm.      05/09/2016 Progression    Progression of disease  after being off therapy x 2 months.      07/26/2016 Imaging    CT CAP- 1. Stable appearance of pulmonary metastases. 2. No thoracic adenopathy. 3. 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 4. Status post Whipple procedure. 5. Similar soft tissue within the retroperitoneum involving the SMA and aorto caval space. 6. Pneumobilia and possible mild cirrhosis. 7. Ventral abdominal wall hernia containing nonobstructed loops of small bowel. 8. Abdominal aortic aneurysm measures 3.7 cm. Unchanged from previous exam. 9. Aortic Atherosclerosis (ICD10-I70.0) and Emphysema (ICD10-J43.9). Coronary artery calcifications noted.      07/26/2016 Imaging    CT head- 1. No metastatic disease or acute intracranial abnormality identified. 2. Recannulized right transverse sinus through right IJ bulb thrombosis since  the 2016 MRI. Small volume of residual chronic nonocclusive thrombus. 3. Mild for age chronic small vessel disease is unchanged since 2016.        INTERVAL HISTORY:  Ms. Truby 76 y.o. female returns to cancer center today for routine follow-up and consideration for next cycle of chemotherapy with 5-FU/Leucovorin/Liposomal Irinotecan.    Her appetite and energy levels are both 75%. She reports feeling 'like my tongue in the size of my mouth are swollen." Denies any trouble swallowing, shortness of breath, or cough. Denies any rash or other concerns for allergic reaction. Notes that "I didn't my tongue last week and I keep biting it because it is swollen." She had Magic mouthwash in the past, but it was expired and she discarded it. She is wondering if there are other medication she continues aside from [place].    Diarrhea remains well controlled with Imodium; she takes anywhere from 2-4 capsules per day depending on how often her bowels are moving. She notes occasional rectal bleeding "but only when my hemorrhoid is acting up." Denies any frank blood in her stool or dark tarry stools.  Endorses some numbness and tingling to her hands and fingers; does not interfere with her ADLs. Reports some cramps to her hands and feet. She tries to drink plenty of fluids during the day, but admittedly does not think she drinks enough. Endorses easy bleeding and bruising.  Overall, she feels ready for her next cycle of chemotherapy today.      REVIEW OF SYSTEMS:  Review of Systems  Constitutional: Positive for fatigue. Negative for chills and fever.  HENT:   Positive for mouth sores. Negative for trouble swallowing.   Eyes: Negative.   Respiratory: Negative.  Negative for cough and shortness of breath.   Cardiovascular: Positive for leg swelling. Negative for chest pain.  Gastrointestinal: Positive for diarrhea. Negative for abdominal pain, blood in stool, constipation, nausea and vomiting.   Endocrine: Negative.   Genitourinary: Negative.  Negative for dysuria, hematuria and vaginal bleeding.   Musculoskeletal: Negative.   Skin: Negative.  Negative for rash.  Neurological: Positive for numbness.  Hematological: Bruises/bleeds easily.  Psychiatric/Behavioral: Negative.      PAST MEDICAL/SURGICAL HISTORY:  Past Medical History:  Diagnosis Date  . Abdominal wall hernia   . Anemia   . Anemia due to antineoplastic chemotherapy 07/05/2016  . Aortic aneurysm (HCC)    small aortic aneurysm  . Arthritis    "little in my right hand" (03/10/2016)  . Cervical cancer ([place]) 1963  . COPD (chronic obstructive pulmonary disease) ([place])   . Emphysema   . GERD (gastroesophageal reflux disease)   . Goals of care, counseling/discussion 02/02/2016  . Hypercholesterolemia   . Hypertension   . Low  serum vitamin B12 01/20/2016  . Pancreatic adenocarcinoma [facility]) July 2010  . Pancreatic cancer metastasized to lung [facility]) 09/04/2015   Past Surgical History:  Procedure Laterality Date  . BREAST CYST EXCISION Right 1988  . BUNIONECTOMY Bilateral 2007  . CARDIAC CATHETERIZATION    . CATARACT EXTRACTION W/ INTRAOCULAR LENS  IMPLANT, BILATERAL Bilateral 2009  . CHOLECYSTECTOMY  July 2010   done w/ Whipple procedure  . IR GENERIC HISTORICAL  03/15/2016   IR GUIDED DRAIN W CATHETER PLACEMENT 03/15/2016 Greggory Keen, MD [facility]-INTERV RAD  . SKIN CANCER EXCISION Left    nasal fold  . STAPLING OF BLEBS Left 04/20/2015   Procedure: STAPLING OF BLEBS;  Surgeon: Ivin Poot, MD;  Location: [facility];  Service: Thoracic;  Laterality: Left;  . THYROID SURGERY     "had a growth taken off"  . TONSILLECTOMY  1943  . VAGINAL HYSTERECTOMY  1963   Cervical Cancer  . VIDEO ASSISTED THORACOSCOPY Left 04/20/2015   Procedure: VIDEO ASSISTED THORACOSCOPY;  Surgeon: Ivin Poot, MD;  Location: [facility];  Service: Thoracic;  Laterality: Left;  . WHIPPLE PROCEDURE  July 2010   pancreatic adenocarcinoma      SOCIAL HISTORY:  Social History   Social History  . Marital status: Widowed    Spouse name: N/A  . Number of children: 0  . Years of education: N/A   Occupational History  .  Retired   Social History Main Topics  . Smoking status: Former Smoker    Packs/day: 1.00    Years: 30.00    Types: Cigarettes    Quit date: 02/29/1996  . Smokeless tobacco: Never Used  . Alcohol use No  . Drug use: No  . Sexual activity: No   Other Topics Concern  . Not on file   Social History Narrative  . No narrative on file    FAMILY HISTORY:  Family History  Problem Relation Age of Onset  . Pancreatic cancer Brother   . Colon cancer Sister 46       12/2006 dx surgery and chemotherapy  . Ovarian cancer Sister 64       Ovarian ca,  surgery and chemotherapy    CURRENT MEDICATIONS:  Outpatient Encounter Prescriptions as of 08/16/2016  Medication Sig Note  . acetaminophen (TYLENOL) 325 MG tablet Take 2 tablets (650 mg total) by mouth every 6 (six) hours as needed for mild pain (or Fever >/= 101).   . cholecalciferol (VITAMIN D) 1000 units tablet Take 2,000 Units by mouth daily.   . feeding supplement, ENSURE ENLIVE, (ENSURE ENLIVE) LIQD Take 237 mLs by mouth 2 (two) times daily between meals.   . hydrochlorothiazide (HYDRODIURIL) 25 MG tablet Take 12.5 mg by mouth every morning.  03/14/2016: 1/2 TAB  . ibuprofen (ADVIL,MOTRIN) 400 MG tablet Take 1 tablet (400 mg total) by mouth every 6 (six) hours as needed for mild pain.   Marland Kitchen lipase/protease/amylase (CREON) 36000 UNITS CPEP capsule Take 2 capsules (72,000 Units total) by mouth 3 (three) times daily before meals.   Marland Kitchen loperamide (IMODIUM A-D) 2 MG tablet At the onset of diarrhea, take 2 tabs. Then take 1 tab every 2 hours until you have gone 12 hours without having a loose stool. If it is bedtime and you have a loose stool(s) take 2 tabs every 4 hours until morning. Call [facility].   Marland Kitchen losartan (COZAAR) 100 MG tablet Take 100 mg by mouth  every morning.    . metoprolol succinate (TOPROL-XL)  50 MG 24 hr tablet Take 25 mg by mouth every morning. Take with or immediately following a meal.  03/14/2016: 1/2 TAB  . omeprazole (PRILOSEC) 20 MG capsule Take 20 mg by mouth daily before breakfast.  03/05/2016: PATIENT CANNOT TAKE PROTONIX (IN-HOUSE)  . ondansetron (ZOFRAN) 8 MG tablet Take 1 tablet (8 mg total) by mouth every 8 (eight) hours as needed for nausea or vomiting.   . prochlorperazine (COMPAZINE) 10 MG tablet Take 1 tablet (10 mg total) by mouth every 6 (six) hours as needed for nausea or vomiting.   . Vitamin D, Ergocalciferol, 2000 units CAPS Take 1 capsule by mouth daily.   Alveda Reasons 20 MG TABS tablet Take 20 mg by mouth daily with supper.     Facility-Administered Encounter Medications as of 08/16/2016  Medication  . sodium chloride flush (NS) 0.9 % injection 10 mL    ALLERGIES:  Allergies  Allergen Reactions  . Oxaliplatin Anaphylaxis  . Pantoprazole Other (See Comments)    THIS GIVES THE PATIENT TERRIBLE HEARTBURN (PLEASE DO NOT GIVE)  . Adhesive [Tape] Rash  . Aspirin Other (See Comments)    Burns stomach  . Neulasta [Pegfilgrastim] Other (See Comments)    UNSURE   . Oxycodone Nausea And Vomiting  . Tramadol Nausea And Vomiting     PHYSICAL EXAM:  ECOG Performance status: 1 - Symptomatic; remains independent      Physical Exam  Constitutional: She is oriented to person, place, and time.  -Thin female in no acute distress -Seen in chemo chair in infusion area.   HENT:  Head: Normocephalic.  Mouth/Throat:    (L) lateral tongue with small ulceration. Bilateral buccal mucosa with mild edema but no ulcerations noted.   Eyes: Conjunctivae are normal. No scleral icterus.  Neck: Normal range of motion. Neck supple.  Cardiovascular: Normal rate and regular rhythm.   Pulmonary/Chest: Effort normal and breath sounds normal. No respiratory distress. She has no wheezes. She has no rales.  Abdominal: Soft.  Bowel sounds are normal. There is no tenderness. There is no rebound and no guarding.  Musculoskeletal: Normal range of motion. She exhibits edema (Trace LE edema; bilat LE compression stockings in place ).  Lymphadenopathy:    She has no cervical adenopathy.  Neurological: She is alert and oriented to person, place, and time. No cranial nerve deficit. Gait normal.  Skin: Skin is warm and dry. No rash noted.  Psychiatric: Mood, memory, affect and judgment normal.  Nursing note and vitals reviewed.    LABORATORY DATA:  I have reviewed the labs as listed.  CBC    Component Value Date/Time   WBC 5.8 08/16/2016 0847   RBC 3.36 (L) 08/16/2016 0847   HGB 10.4 (L) 08/16/2016 0847   HCT 33.2 (L) 08/16/2016 0847   PLT 143 (L) 08/16/2016 0847   MCV 98.8 08/16/2016 0847   MCH 31.0 08/16/2016 0847   MCHC 31.3 08/16/2016 0847   RDW 16.5 (H) 08/16/2016 0847   LYMPHSABS 1.1 08/16/2016 0847   MONOABS 0.7 08/16/2016 0847   EOSABS 0.0 08/16/2016 0847   BASOSABS 0.0 08/16/2016 0847   CMP Latest Ref Rng & Units 08/16/2016 08/02/2016 07/19/2016  Glucose 65 - 99 mg/dL 122(H) 85 95  BUN 6 - 20 mg/dL 21(H) 17 22(H)  Creatinine 0.44 - 1.00 mg/dL 1.06(H) 1.16(H) 1.05(H)  Sodium 135 - 145 mmol/L 137 137 137  Potassium 3.5 - 5.1 mmol/L 4.2 4.5 4.2  Chloride 101 - 111 mmol/L 108 110 109  CO2  22 - 32 mmol/L 22 21(L) 20(L)  Calcium 8.9 - 10.3 mg/dL 8.7(L) 8.6(L) 8.8(L)  Total Protein 6.5 - 8.1 g/dL 6.4(L) 6.4(L) 6.4(L)  Total Bilirubin 0.3 - 1.2 mg/dL 0.7 0.7 0.6  Alkaline Phos 38 - 126 U/L 107 111 101  AST 15 - 41 U/L _0 ALT 14 - 54 U/L 13(L) 11(L) 10(L)      PENDING LABS:    DIAGNOSTIC IMAGING:  *The following radiologic images and reports have been reviewed independently and agree with below findings.  CT chest/abd/pelvis: 07/26/16       CT head: 07/26/16       PATHOLOGY:  (L) lung wedge resection path: 04/20/15           ASSESSMENT & PLAN:   Stage IV  adenocarcinoma of pancreas with lung mets: -Initially diagnosed in 08/2008. Treated with Whipple by Dr. Eugenia Pancoast at [facility]. She received adjuvant chemotherapy with Gemcitabine x 6 months, completing in 08/2009. Found to have metastatic lung disease and underwent wedge resection in 08/2009 for this metastatic disease. Underwent additional left lung resection of lesion felt to be lung primary malignancy with Dr. Arlyce Dice in 05/2010. Underwent SBRT to lung with Dr. Tammi Klippel, which completed on 09/04/11, followed by 5-FU based chemotherapy through 06/13/12. Then was treated with Gemzar/Abraxane for metastatic disease at [facility] in [place] from 02/13/13-05/20/13. Mild progression noted on scans in 06/2014. Gemzar/Abraxane was stopped d/t poor tolerance.  In 03/2015, patient had spontaneous (L) lung pneumothorax. Underwent (L) VATS and mini thoracotomy with path revealing high-grade carcinoma consistent with pancreatic primary. Progression of disease noted on imaging in 09/29/15 and chemotherapy with 5-FU/Leucovorin/Irinotecan started in 09/2015. Again, had spontaneous pneumothorax requiring chest tube placement on 03/05/16 with Dr. Cyndia Bent.  Chemotherapy was held as she recovered and resumed on 04/26/16. Progression of disease noted on CT imaging on 05/09/16, which was likely secondary to being off of therapy x 2 months with pneumothorax.  -Last restaging CT chest/abd/pelvis on 07/26/16 reviewed with patient and her sister in detail today. CT head also done on 07/26/16 d/t pt's complaints of headaches, which was negative for malignancy.  Overall, disease is slightly improved and stable. We will continue current chemo regimen. She will be due for repeat restaging imaging sometime in 10/2016; will place orders at subsequent follow-up visit.    -Tumor marker remains variable. Discussed with patient that stability to scans is often more important than a single CA 19-9 result. We will of course  continue to monitor the trend of CA 19-9.  -She understands that her cancer is not curable, but is treatable.  -Due for cycle #19 5-FU/leucovorin/Liposomal irinotecan today; labs reviewed and are adequate for treatment today.  -Return to cancer center for follow-up and subsequent chemotherapy every 2 weeks.  -Return for follow-up visit in 1 month.   Chemo-induced anemia:  -On Aranesp every 2 weeks. Hgb 10.4 today, so Aranesp held per protocol.  -Continue Aranesp every 2 weeks as scheduled.   Mucositis:  -Likely secondary to chemo. Mild edema noted to bilat buccal mucosa and tongue possibly d/t chemo as well.  -Grade 1-2 oral mucositis noted today. Patient provided prescription for Magic Mouthwash (1 part Maalox, 1 part Benadryl, 1 part Lidocaine) as well as viscous lidocaine solution that she can use as needed. Encouraged continued good oral hygiene as well with baking soda/salt water rinses.  Encouraged her to let us know if she has any new/worsening symptoms.   Peripheral neuropathy/Muscle  cramps:  -Neuropathy is grade 1 and stable; no intervention needed right now. Will keep monitoring.  -For muscle cramps, encouraged her to try tonic water with quinine for symptom relief. Also encouraged her to push oral intake of fluids, as electrolyte imbalances or dehydration can worsen myalgias.   Bilateral lower extremity varicose veins/spider veins: -Now wearing support/compression hose with some improvement in symptoms.    Diarrhea:  -Secondary to chemotherapy. Stable.  -Continue Imodium PRN, which remains effective. Encouraged her to let us know if Imodium becomes ineffective, as we can give her a prescription for Lomotil, if needed.   Vitamin B12 deficiency:  -She received injection in 07/2016 and "some of these symptoms in my mouth started after I got the B12 shot."  Will discontinue B12 injections per patient wishes; supportive therapy treatment plan changed accordingly.        Dispo:   -Continue chemo every 2 weeks as scheduled.  -Return to cancer center in 1 month for follow-up.    All questions were answered to patient's stated satisfaction. Encouraged patient to call with any new concerns or questions before her next visit to the cancer center and we can certain see her sooner, if needed.    Plan of care discussed with Dr. Talbert Cage, who agrees with the above aforementioned.     Orders placed this encounter:  No orders of the defined types were placed in this encounter.     Mike Craze, NP [place] [phone]

## 2016-08-17 LAB — CANCER ANTIGEN 19-9: CA 19-9: 669 U/mL — ABNORMAL HIGH (ref 0–35)

## 2016-08-18 ENCOUNTER — Encounter (HOSPITAL_COMMUNITY): Payer: Self-pay

## 2016-08-18 ENCOUNTER — Encounter (HOSPITAL_BASED_OUTPATIENT_CLINIC_OR_DEPARTMENT_OTHER): Payer: Medicare Other

## 2016-08-18 VITALS — BP 119/82 | HR 56 | Temp 98.1°F | Resp 18

## 2016-08-18 DIAGNOSIS — Z5189 Encounter for other specified aftercare: Secondary | ICD-10-CM

## 2016-08-18 DIAGNOSIS — C7800 Secondary malignant neoplasm of unspecified lung: Principal | ICD-10-CM

## 2016-08-18 DIAGNOSIS — C259 Malignant neoplasm of pancreas, unspecified: Secondary | ICD-10-CM

## 2016-08-18 MED ORDER — SODIUM CHLORIDE 0.9% FLUSH
10.0000 mL | INTRAVENOUS | Status: DC | PRN
  Administered 2016-08-18: 10 mL
  Filled 2016-08-18: qty 10

## 2016-08-18 MED ORDER — HEPARIN SOD (PORK) LOCK FLUSH 100 UNIT/ML IV SOLN
500.0000 [IU] | Freq: Once | INTRAVENOUS | Status: AC | PRN
Start: 2016-08-18 — End: 2016-08-18
  Administered 2016-08-18: 500 [IU]

## 2016-08-18 MED ORDER — HEPARIN SOD (PORK) LOCK FLUSH 100 UNIT/ML IV SOLN
INTRAVENOUS | Status: AC
  Filled 2016-08-18: qty 5

## 2016-08-18 NOTE — Progress Notes (Signed)
[name] [name] presents to have home infusion pump d/c'd and for port-a-cath deaccess with flush.  Proper placement of portacath confirmed by CXR.  Portacath located right chest wall accessed with  H 20 needle.  Good blood return present. Portacath flushed with NS and 500U/58ml Heparin, and needle removed intact.  Procedure tolerated well and without incident.  Discharged ambulatory.

## 2016-08-30 ENCOUNTER — Encounter (HOSPITAL_COMMUNITY): Payer: Self-pay

## 2016-08-30 ENCOUNTER — Other Ambulatory Visit (HOSPITAL_COMMUNITY): Payer: Self-pay | Admitting: Hematology & Oncology

## 2016-08-30 ENCOUNTER — Encounter (HOSPITAL_COMMUNITY): Payer: Medicare Other | Attending: Adult Health

## 2016-08-30 VITALS — BP 113/49 | HR 59 | Temp 98.6°F | Resp 16 | Wt 118.0 lb

## 2016-08-30 DIAGNOSIS — Z5111 Encounter for antineoplastic chemotherapy: Secondary | ICD-10-CM

## 2016-08-30 DIAGNOSIS — C7800 Secondary malignant neoplasm of unspecified lung: Secondary | ICD-10-CM | POA: Diagnosis not present

## 2016-08-30 DIAGNOSIS — C259 Malignant neoplasm of pancreas, unspecified: Secondary | ICD-10-CM | POA: Diagnosis not present

## 2016-08-30 DIAGNOSIS — D6481 Anemia due to antineoplastic chemotherapy: Secondary | ICD-10-CM

## 2016-08-30 DIAGNOSIS — R197 Diarrhea, unspecified: Secondary | ICD-10-CM | POA: Diagnosis not present

## 2016-08-30 DIAGNOSIS — Z95828 Presence of other vascular implants and grafts: Secondary | ICD-10-CM | POA: Diagnosis not present

## 2016-08-30 LAB — CBC WITH DIFFERENTIAL/PLATELET
BASOS PCT: 0 %
Basophils Absolute: 0 10*3/uL (ref 0.0–0.1)
EOS ABS: 0 10*3/uL (ref 0.0–0.7)
EOS PCT: 1 %
HCT: 29.2 % — ABNORMAL LOW (ref 36.0–46.0)
HEMOGLOBIN: 9.4 g/dL — AB (ref 12.0–15.0)
Lymphocytes Relative: 17 %
Lymphs Abs: 1.1 10*3/uL (ref 0.7–4.0)
MCH: 31.1 pg (ref 26.0–34.0)
MCHC: 32.2 g/dL (ref 30.0–36.0)
MCV: 96.7 fL (ref 78.0–100.0)
Monocytes Absolute: 0.8 10*3/uL (ref 0.1–1.0)
Monocytes Relative: 13 %
NEUTROS PCT: 70 %
Neutro Abs: 4.6 10*3/uL (ref 1.7–7.7)
PLATELETS: 113 10*3/uL — AB (ref 150–400)
RBC: 3.02 MIL/uL — AB (ref 3.87–5.11)
RDW: 15.8 % — ABNORMAL HIGH (ref 11.5–15.5)
WBC: 6.6 10*3/uL (ref 4.0–10.5)

## 2016-08-30 LAB — COMPREHENSIVE METABOLIC PANEL
ALBUMIN: 3.4 g/dL — AB (ref 3.5–5.0)
ALK PHOS: 113 U/L (ref 38–126)
ALT: 12 U/L — AB (ref 14–54)
ANION GAP: 4 — AB (ref 5–15)
AST: 17 U/L (ref 15–41)
BUN: 21 mg/dL — ABNORMAL HIGH (ref 6–20)
CALCIUM: 8.7 mg/dL — AB (ref 8.9–10.3)
CHLORIDE: 113 mmol/L — AB (ref 101–111)
CO2: 21 mmol/L — AB (ref 22–32)
Creatinine, Ser: 1.04 mg/dL — ABNORMAL HIGH (ref 0.44–1.00)
GFR calc non Af Amer: 50 mL/min — ABNORMAL LOW (ref >60)
GFR, EST AFRICAN AMERICAN: 58 mL/min — AB (ref >60)
GLUCOSE: 94 mg/dL (ref 65–99)
Potassium: 4.8 mmol/L (ref 3.5–5.1)
SODIUM: 138 mmol/L (ref 135–145)
Total Bilirubin: 0.8 mg/dL (ref 0.3–1.2)
Total Protein: 6.3 g/dL — ABNORMAL LOW (ref 6.5–8.1)

## 2016-08-30 MED ORDER — DARBEPOETIN ALFA 500 MCG/ML IJ SOSY
500.0000 ug | PREFILLED_SYRINGE | Freq: Once | INTRAMUSCULAR | Status: AC
  Administered 2016-08-30: 500 ug via SUBCUTANEOUS
  Filled 2016-08-30: qty 1

## 2016-08-30 MED ORDER — PALONOSETRON HCL INJECTION 0.25 MG/5ML
0.2500 mg | Freq: Once | INTRAVENOUS | Status: AC
  Administered 2016-08-30: 0.25 mg via INTRAVENOUS
  Filled 2016-08-30: qty 5

## 2016-08-30 MED ORDER — SODIUM CHLORIDE 0.9 % IV SOLN
Freq: Once | INTRAVENOUS | Status: AC
  Administered 2016-08-30: 09:00:00 via INTRAVENOUS

## 2016-08-30 MED ORDER — PROCHLORPERAZINE MALEATE 10 MG PO TABS
10.0000 mg | ORAL_TABLET | Freq: Once | ORAL | Status: AC
  Administered 2016-08-30: 10 mg via ORAL
  Filled 2016-08-30: qty 1

## 2016-08-30 MED ORDER — SODIUM CHLORIDE 0.9 % IV SOLN
1500.0000 mg/m2 | INTRAVENOUS | Status: DC
  Administered 2016-08-30: 2450 mg via INTRAVENOUS
  Filled 2016-08-30: qty 49

## 2016-08-30 MED ORDER — DEXTROSE 5 % IV SOLN
400.0000 mg/m2 | Freq: Once | INTRAVENOUS | Status: AC
  Administered 2016-08-30: 652 mg via INTRAVENOUS
  Filled 2016-08-30: qty 32.6

## 2016-08-30 MED ORDER — SODIUM CHLORIDE 0.9 % IV SOLN
35.0000 mg/m2 | Freq: Once | INTRAVENOUS | Status: AC
  Administered 2016-08-30: 55.9 mg via INTRAVENOUS
  Filled 2016-08-30: qty 13

## 2016-08-30 MED ORDER — SODIUM CHLORIDE 0.9% FLUSH
10.0000 mL | INTRAVENOUS | Status: DC | PRN
  Administered 2016-08-30: 10 mL
  Filled 2016-08-30: qty 10

## 2016-08-30 NOTE — Patient Instructions (Signed)
[facility] Discharge Instructions for Patients Receiving Chemotherapy   Beginning January 23rd 2017 lab work for the [facility] will be done in the  Main lab at [facility] on 1st floor. If you have a lab appointment with the [place] please come in thru the  Main Entrance and check in at the main information desk   Today you received the following chemotherapy agents: Irinotecan Liposome, Leucovorin, flurouracil.  Follow up in 2 days to have pump removed.  To help prevent nausea and vomiting after your treatment, we encourage you to take your nausea medication as prescribed.   If you develop nausea and vomiting, or diarrhea that is not controlled by your medication, call the clinic.  The clinic phone number is [phone]. Office hours are Monday-Friday 8:30am-5:00pm.  BELOW ARE SYMPTOMS THAT SHOULD BE REPORTED IMMEDIATELY:  *FEVER GREATER THAN 101.0 F  *CHILLS WITH OR WITHOUT FEVER  NAUSEA AND VOMITING THAT IS NOT CONTROLLED WITH YOUR NAUSEA MEDICATION  *UNUSUAL SHORTNESS OF BREATH  *UNUSUAL BRUISING OR BLEEDING  TENDERNESS IN MOUTH AND THROAT WITH OR WITHOUT PRESENCE OF ULCERS  *URINARY PROBLEMS  *BOWEL PROBLEMS  UNUSUAL RASH Items with * indicate a potential emergency and should be followed up as soon as possible. If you have an emergency after office hours please contact your primary care physician or go to the nearest emergency department.  Please call the clinic during office hours if you have any questions or concerns.   You may also contact the Patient Navigator at [phone] should you have any questions or need assistance in obtaining follow up care.      Resources For Cancer Patients and their Caregivers ? American Cancer Society: Can assist with transportation, wigs, general needs, runs Look Good Feel Better.        (843)536-0117 ? Cancer Care: Provides financial assistance, online support groups, medication/co-pay  assistance.  1-800-813-HOPE (989) 619-8365) ? [place] Assists [place] cancer patients and their families through emotional , educational and financial support.  971-019-7397 ? [place] DSS Where to apply for food stamps, Medicaid and utility assistance. 614-487-1543 ? RCATS: Transportation to medical appointments. 952-688-8565 ? Social Security Administration: May apply for disability if have a Stage IV cancer. ([phone] ? [employer], Disability and Transit Services: Assists with nutrition, care and transit needs. 226-010-4546

## 2016-08-30 NOTE — Progress Notes (Signed)
Patient tolerated infusion without incidence.  Patient discharged ambulatory and in stable condition from clinic to self. Patient's infusion pump running on discharged and patient to follow up in 2 days to have pump disconnected.

## 2016-08-30 NOTE — Progress Notes (Signed)
Patient states that she does not want to receive the B-12 injection today. She felt "so bad after the last one" and "my fingertips are finally feeling better, not as numb".  Provider aware of patient's request to hold injection.

## 2016-08-30 NOTE — Progress Notes (Signed)
Okay per Dr. Talbert Cage to give Aranesp injection today. Hgb 9.4.

## 2016-08-31 ENCOUNTER — Other Ambulatory Visit (HOSPITAL_COMMUNITY): Payer: Self-pay | Admitting: Oncology

## 2016-09-01 ENCOUNTER — Encounter (HOSPITAL_BASED_OUTPATIENT_CLINIC_OR_DEPARTMENT_OTHER): Payer: Medicare Other

## 2016-09-01 ENCOUNTER — Encounter (HOSPITAL_COMMUNITY): Payer: Self-pay

## 2016-09-01 VITALS — BP 114/72 | HR 57 | Temp 97.8°F | Resp 18

## 2016-09-01 DIAGNOSIS — C259 Malignant neoplasm of pancreas, unspecified: Secondary | ICD-10-CM | POA: Diagnosis not present

## 2016-09-01 DIAGNOSIS — C7800 Secondary malignant neoplasm of unspecified lung: Secondary | ICD-10-CM

## 2016-09-01 DIAGNOSIS — Z452 Encounter for adjustment and management of vascular access device: Secondary | ICD-10-CM

## 2016-09-01 MED ORDER — SODIUM CHLORIDE 0.9% FLUSH
10.0000 mL | INTRAVENOUS | Status: DC | PRN
  Administered 2016-09-01: 10 mL
  Filled 2016-09-01: qty 10

## 2016-09-01 MED ORDER — HEPARIN SOD (PORK) LOCK FLUSH 100 UNIT/ML IV SOLN
500.0000 [IU] | Freq: Once | INTRAVENOUS | Status: AC | PRN
  Administered 2016-09-01: 500 [IU]

## 2016-09-01 MED ORDER — HEPARIN SOD (PORK) LOCK FLUSH 100 UNIT/ML IV SOLN
INTRAVENOUS | Status: AC
  Filled 2016-09-01: qty 5

## 2016-09-01 NOTE — Patient Instructions (Signed)
[place] at [facility] Discharge Instructions  RECOMMENDATIONS MADE BY THE CONSULTANT AND ANY TEST RESULTS WILL BE SENT TO YOUR REFERRING PHYSICIAN.  Your home infusion pump was removed today. Your port was deaccessed. Follow up in 2 weeks for next treatment.  Thank you for choosing [place] at [facility] to provide your oncology and hematology care.  To afford each patient quality time with our provider, please arrive at least 15 minutes before your scheduled appointment time.    If you have a lab appointment with the [place] please come in thru the  Main Entrance and check in at the main information desk  You need to re-schedule your appointment should you arrive 10 or more minutes late.  We strive to give you quality time with our providers, and arriving late affects you and other patients whose appointments are after yours.  Also, if you no show three or more times for appointments you may be dismissed from the clinic at the providers discretion.     Again, thank you for choosing [facility].  Our hope is that these requests will decrease the amount of time that you wait before being seen by our physicians.       _____________________________________________________________  Should you have questions after your visit to [facility], please contact our office at [phone] between the hours of 8:30 a.m. and 4:30 p.m.  Voicemails left after 4:30 p.m. will not be returned until the following business day.  For prescription refill requests, have your pharmacy contact our office.       Resources For Cancer Patients and their Caregivers ? American Cancer Society: Can assist with transportation, wigs, general needs, runs Look Good Feel Better.        937-504-2746 ? Cancer Care: Provides financial assistance, online support groups, medication/co-pay assistance.  1-800-813-HOPE 706-614-1577) ? [place] Assists [place] cancer patients and their families through emotional , educational and financial support.  (559)461-1778 ? [place] DSS Where to apply for food stamps, Medicaid and utility assistance. (203)360-9847 ? RCATS: Transportation to medical appointments. (313)888-1240 ? Social Security Administration: May apply for disability if have a Stage IV cancer. 2084226961 (661)212-5947 ? [employer], Disability and Transit Services: Assists with nutrition, care and transit needs. [place] Support Programs: @10RELATIVEDAYS @ > Cancer Support Group  2nd Tuesday of the month 1pm-2pm, Journey Room  > Creative Journey  3rd Tuesday of the month 1130am-1pm, Journey Room  > Look Good Feel Better  1st Wednesday of the month 10am-12 noon, Journey Room (Call [place] to register [phone])

## 2016-09-01 NOTE — Progress Notes (Signed)
Patient arrived to clinic with home infusion pump off. Patient denies any issues with pump operation over the past two days. Patient states that she didn't feel well after treatment on Tuesday but today feels much better. She states she is fatigued but no worse than her usual. Patient's port flushed and deaccessed with needle intact. Patient to follow up in 2 weeks as scheduled.

## 2016-09-12 NOTE — Progress Notes (Addendum)
[name] [name], [place] [address]  Pancreatic cancer metastasized to lung [facility]) - Plan: CBC with Differential, Comprehensive metabolic panel, Cancer antigen 19-9, CBC with Differential, Comprehensive metabolic panel, CBC with Differential, Comprehensive metabolic panel, Cancer antigen 19-9  CURRENT THERAPY: Liposomal Irinotecan/5FU/Leucovorin  INTERVAL HISTORY: [name] [name] 76 y.o. female returns for followup of Stage IV pancreatic cancer, currently on palliative liposomal irinotecan/5FU/Leucovorin AND H/O Stage I BAC of lung AND Recurrent pneumothroax requiring chest tube placement, followed/managed by Dr. Prescott Gum, requiring a HOLD in chemotherapy (03/01/2016- 04/2016).    Pancreatic cancer metastasized to lung [facility])   09/03/2008 Surgery    Whipple with Dr. Birdie [name] for T3N1 3/12 LN+ Pancreatic Adenocarcinoma      11/13/2008 - 09/12/2009 Chemotherapy    Adjuvant Gemcitabine X 6 months      08/31/2009 Surgery    Metastectomy with Dr. Arlyce [name] RUL 2 nodules, RLL (wedge resection X 2)      08/31/2009 Pathology Results    Metastatic adenocarcinoma TTF-1 negative      06/03/2010 Surgery    Left video-assisted thoracoscopic surgery, resection of left lower lobe lesion. with Dr. Arlyce [name]      06/04/2010 Pathology Results    adenocarcinoma positive for cytokeratin 7, focally positive for CDX2, with scattered staining for TTF1 cytokeratin 20. [facility] opinion felt to be lung primary although noted to stain for GI markers, not felt to be pancreatic primary. EGFR and ALK negative      03/31/2011 - 09/04/2011 Radiation Therapy    SBRT LUL 18Gy Dr. Tammi Klippel      03/15/2012 - 06/13/2012 Chemotherapy    5-FU based chemotherapy      02/13/2013 - 05/20/2013 Chemotherapy    Gemzar Abraxane for metastatic disease (given to cover both primaries at [facility])      07/15/2014 PET scan    Mild progression of pulm mets, hypermet mesenteric tumor assoc with  SMA is  stable to slightly increased in degree of FDG uptake      07/23/2014 Imaging         07/30/2014 - 09/10/2014 Chemotherapy    Gemzar abraxane stopped due to poor tolerance      04/06/2015 Procedure    L 20 French chest tube for spontaneous pneumothorax, by Dr. Tharon Aquas Trigt      04/20/2015 Pathology Results    High grade carcinoma c/w patient's known pancreatic primary (secondary opinion at [facility]) PDL-1 high expression, preserved major and minor MMR, MSI stable, Foundation ONE testing performed,       04/20/2015 Surgery    Left VATS, mini thoracotomy with stapling and oversewing of blebs, Talc pleurodesis. performed secondary to persistent air leak not improved with chest tube drainage      07/29/2015 Imaging    No signif change in appear of multifocal B cavitary pulmonary lesions, postop appearance upper abdomen, no findings of met disease to abd or pelvis,      09/29/2015 Imaging    Slight progression of multifocal cavitary pulmonary metastases, as discussed above. 2. Interval development of a lytic lesion in the antral lateral aspect of the left fifth rib where there is a nondisplaced pathologic fracture. No other definite osseous metastases are identified. 3. Prominent soft tissue adjacent to the proximal superior mesenteric artery and in the aortocaval nodal station, similar to prior examinations, likely to represent treated metastatic lymphadenopathy. 4. Epigastric ventral hernia containing several short loops of small bowel. There is a focally  dilated loop of small bowel adjacent to this, however, this is an isolated finding. No other dilatation of small bowel or colon to suggest frank bowel obstruction at this time.       10/19/2015 - 03/01/2016 Chemotherapy    The patient had palonosetron (ALOXI) injection 0.25 mg, 0.25 mg, Intravenous,  Once, 10 of 12 cycles  leucovorin 652 mg in dextrose 5 % 250 mL infusion, 400 mg/m2 = 652 mg, Intravenous,  Once, 10 of  12 cycles  fluorouracil (ADRUCIL) 2,950 mg in sodium chloride 0.9 % 91 mL chemo infusion, 1,800 mg/m2 = 2,950 mg (100 % of original dose 1,800 mg/m2), Intravenous, 1 Day/Dose, 10 of 12 cycles Dose modification: 1,920 mg/m2 (original dose 1,800 mg/m2, Cycle 1, Reason: Provider Judgment), 1,800 mg/m2 (original dose 1,800 mg/m2, Cycle 1, Reason: Provider Judgment), 1,500 mg/m2 (original dose 1,800 mg/m2, Cycle 2, Reason: Dose not tolerated)  irinotecan LIPOSOME (ONIVYDE) 55.9 mg in sodium chloride 0.9 % 500 mL chemo infusion, 35 mg/m2 = 55.9 mg (100 % of original dose 35 mg/m2), Intravenous, Once, 10 of 12 cycles Dose modification: 35 mg/m2 (original dose 35 mg/m2, Cycle 1, Reason: Provider Judgment)  for chemotherapy treatment.        01/12/2016 Imaging    CT CAP- 1. Cavitary lung metastases are stable to decreased in size. 2. Expansile mixed lytic and sclerotic bone metastasis in the anterior left fifth rib is increased in sclerosis and mildly increased in size. 3. Infiltrative soft tissue in the retroperitoneum abutting the SMA is stable. 4. No new sites of metastatic disease in the chest, abdomen or pelvis.      03/05/2016 Procedure    Chest tube placed for spontaneous pneumothorax by Dr. Cyndia Bent      03/05/2016 - 03/10/2016 Hospital Admission    Admit date: 03/05/2016 Admission diagnosis: Spontenous pneumothorax Additional comments: chest tube placed by Dr. Cyndia Bent on 03/05/2016      03/14/2016 - 03/18/2016 Hospital Admission    Admit date: 03/14/2016 Admission diagnosis: Recurrent spontaneous pneumothorax Additional comments:       04/26/2016 -  Chemotherapy    The patient had palonosetron (ALOXI) injection 0.25 mg, 0.25 mg, Intravenous,  Once, 10 of 12 cycles  leucovorin 652 mg in dextrose 5 % 250 mL infusion, 400 mg/m2 = 652 mg, Intravenous,  Once, 10 of 12 cycles  fluorouracil (ADRUCIL) 2,950 mg in sodium chloride 0.9 % 91 mL chemo infusion, 1,800 mg/m2 = 2,950 mg (100 % of  original dose 1,800 mg/m2), Intravenous, 1 Day/Dose, 10 of 12 cycles Dose modification: 1,920 mg/m2 (original dose 1,800 mg/m2, Cycle 1, Reason: Provider Judgment), 1,800 mg/m2 (original dose 1,800 mg/m2, Cycle 1, Reason: Provider Judgment), 1,500 mg/m2 (original dose 1,800 mg/m2, Cycle 2, Reason: Dose not tolerated)  irinotecan LIPOSOME (ONIVYDE) 55.9 mg in sodium chloride 0.9 % 500 mL chemo infusion, 35 mg/m2 = 55.9 mg (100 % of original dose 35 mg/m2), Intravenous, Once, 10 of 12 cycles Dose modification: 35 mg/m2 (original dose 35 mg/m2, Cycle 1, Reason: Provider Judgment)  for chemotherapy treatment.        05/09/2016 Imaging    CT CAP- CT CHEST IMPRESSION  1. Mild progression of pulmonary metastasis. Although the majority of cavitary lung lesions are primarily similar, there are new and increased small cavitary nodules. 2. Trace residual loculated inferolateral right pleural air. 3. No thoracic adenopathy. 4. Progression of anterior left fifth rib metastasis with soft tissue component surrounding.  CT ABDOMEN AND PELVIS IMPRESSION  1. Status post Whipple procedure, without  locally recurrent disease. 2. Similar soft tissue fullness within the retroperitoneum. 3. Pneumobilia. Possible mild cirrhosis. 4. Similar small bowel containing ventral abdominal wall laxity. 5. Similar abdominal aortic aneurysm.      05/09/2016 Progression    Progression of disease after being off therapy x 2 months.      07/26/2016 Imaging    CT CAP- 1. Stable appearance of pulmonary metastases. 2. No thoracic adenopathy. 3. 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 4. Status post Whipple procedure. 5. Similar soft tissue within the retroperitoneum involving the SMA and aorto caval space. 6. Pneumobilia and possible mild cirrhosis. 7. Ventral abdominal wall hernia containing nonobstructed loops of small bowel. 8. Abdominal aortic aneurysm measures 3.7 cm. Unchanged  from previous exam. 9. Aortic Atherosclerosis (ICD10-I70.0) and Emphysema (ICD10-J43.9). Coronary artery calcifications noted.      07/26/2016 Imaging    CT head- 1. No metastatic disease or acute intracranial abnormality identified. 2. Recannulized right transverse sinus through right IJ bulb thrombosis since the 2016 MRI. Small volume of residual chronic nonocclusive thrombus. 3. Mild for age chronic small vessel disease is unchanged since 2016.        HPI Elements   Location: Pancreas  Quality: Adenocarcinoma  Severity: Stage IV  Duration: Dx in 08/2008  Context: S/P whipple by Dr. Eugenia [name] on 09/03/2008 followed by adjuvant gemcitabine x 6 mo, then metastectomy by Dr. Arlyce [name] in 08/2009, SBRT in 03/2011, ongoing palliative systemic chemotherapy.  Timing:   Modifying Factors:   Associated Signs & Symptoms:    She denies any complaints today.  She continues to tolerate treatment well.  She continues to have issues with diarrhea that is managed with Lomotil and she is taking her Creon.  She denies any oncology related complaints.  Her appetite and energy level is stable.  Her weight is stable.  Review of Systems  Constitutional: Negative.  Negative for chills, fever and weight loss.  HENT: Negative.   Eyes: Negative.   Respiratory: Negative.  Negative for cough.   Cardiovascular: Negative.  Negative for chest pain.  Gastrointestinal: Positive for diarrhea. Negative for blood in stool, constipation, melena, nausea and vomiting.  Genitourinary: Negative.   Musculoskeletal: Negative.   Skin: Negative.   Neurological: Negative.  Negative for weakness.  Endo/Heme/Allergies: Negative.   Psychiatric/Behavioral: Negative.     Past Medical History:  Diagnosis Date  . Abdominal wall hernia   . Anemia   . Anemia due to antineoplastic chemotherapy 07/05/2016  . Aortic aneurysm (HCC)    small aortic aneurysm  . Arthritis    "little in my right hand" (03/10/2016)  . Cervical cancer  ([name] [name]) 1963  . COPD (chronic obstructive pulmonary disease) ([place])   . Emphysema   . GERD (gastroesophageal reflux disease)   . Goals of care, counseling/discussion 02/02/2016  . Hypercholesterolemia   . Hypertension   . Low serum vitamin B12 01/20/2016  . Pancreatic adenocarcinoma [facility]) July 2010  . Pancreatic cancer metastasized to lung [facility]) 09/04/2015    Past Surgical History:  Procedure Laterality Date  . BREAST CYST EXCISION Right 1988  . BUNIONECTOMY Bilateral 2007  . CARDIAC CATHETERIZATION    . CATARACT EXTRACTION W/ INTRAOCULAR LENS  IMPLANT, BILATERAL Bilateral 2009  . CHOLECYSTECTOMY  July 2010   done w/ Whipple procedure  . IR GENERIC HISTORICAL  03/15/2016   IR GUIDED DRAIN W CATHETER PLACEMENT 03/15/2016 [name] Keen, MD [facility]-INTERV RAD  . SKIN CANCER EXCISION Left    nasal fold  . STAPLING OF  BLEBS Left 04/20/2015   Procedure: STAPLING OF BLEBS;  Surgeon: Ivin Poot, MD;  Location: [place];  Service: Thoracic;  Laterality: Left;  . THYROID SURGERY     "had a growth taken off"  . TONSILLECTOMY  1943  . VAGINAL HYSTERECTOMY  1963   Cervical Cancer  . VIDEO ASSISTED THORACOSCOPY Left 04/20/2015   Procedure: VIDEO ASSISTED THORACOSCOPY;  Surgeon: Ivin Poot, MD;  Location: [place];  Service: Thoracic;  Laterality: Left;  . WHIPPLE PROCEDURE  July 2010   pancreatic adenocarcinoma    Family History  Problem Relation Age of Onset  . Pancreatic cancer Brother   . Colon cancer Sister 61       12/2006 dx surgery and chemotherapy  . Ovarian cancer Sister 15       Ovarian ca,  surgery and chemotherapy    Social History   Social History  . Marital status: Widowed    Spouse name: N/A  . Number of children: 0  . Years of education: N/A   Occupational History  .  Retired   Social History Main Topics  . Smoking status: Former Smoker    Packs/day: 1.00    Years: 30.00    Types: Cigarettes    Quit date: 02/29/1996  . Smokeless tobacco: Never Used  . Alcohol  use No  . Drug use: No  . Sexual activity: No   Other Topics Concern  . Not on file   Social History Narrative  . No narrative on file     PHYSICAL EXAMINATION  ECOG PERFORMANCE STATUS: 1 - Symptomatic but completely ambulatory  There were no vitals filed for this visit.  Vitals - 1 value per visit 8/50/2774  SYSTOLIC 128  DIASTOLIC 39  Pulse 59  Temperature 98  Respirations 20  Weight (lb) 118.4   GENERAL:alert, no distress, well nourished, well developed, comfortable, cooperative, smiling and unaccompanied SKIN: skin color, texture, turgor are normal, no rashes or significant lesions HEAD: Normocephalic, No masses, lesions, tenderness or abnormalities EYES: normal, EOMI, Conjunctiva are pink and non-injected EARS: External ears normal OROPHARYNX:lips, buccal mucosa, and tongue normal and mucous membranes are moist  NECK: supple, trachea midline LYMPH:  no palpable lymphadenopathy BREAST:not examined LUNGS: clear to auscultation  HEART: regular rate & rhythm, no murmurs and no gallops ABDOMEN:abdomen soft and normal bowel sounds BACK: Back symmetric, no curvature. EXTREMITIES:less then 2 second capillary refill, no joint deformities, effusion, or inflammation, no edema, no skin discoloration, no clubbing, no cyanosis  NEURO: alert & oriented x 3 with fluent speech, no focal motor/sensory deficits, gait normal  LABORATORY DATA: CBC    Component Value Date/Time   WBC 10.0 09/13/2016 0930   RBC 3.25 (L) 09/13/2016 0930   HGB 10.0 (L) 09/13/2016 0930   HCT 31.9 (L) 09/13/2016 0930   PLT 137 (L) 09/13/2016 0930   MCV 98.2 09/13/2016 0930   MCH 30.8 09/13/2016 0930   MCHC 31.3 09/13/2016 0930   RDW 17.5 (H) 09/13/2016 0930   LYMPHSABS 0.8 09/13/2016 0930   MONOABS 1.3 (H) 09/13/2016 0930   EOSABS 0.0 09/13/2016 0930   BASOSABS 0.0 09/13/2016 0930      Chemistry      Component Value Date/Time   NA 137 09/13/2016 0930   K 4.4 09/13/2016 0930   CL 110  09/13/2016 0930   CO2 20 (L) 09/13/2016 0930   BUN 18 09/13/2016 0930   CREATININE 1.16 (H) 09/13/2016 0930      Component Value Date/Time  CALCIUM 8.5 (L) 09/13/2016 0930   ALKPHOS 123 09/13/2016 0930   AST 14 (L) 09/13/2016 0930   ALT 9 (L) 09/13/2016 0930   BILITOT 1.7 (H) 09/13/2016 0930     Results for VELINDA, WROBEL (MRN [id number]) as of 09/13/2016 09:44  Ref. Range 08/16/2016 08:47  CA 19-9 Latest Ref Range: 0 - 35 U/mL 669 (H)     PENDING LABS:   RADIOGRAPHIC STUDIES:  No results found.   PATHOLOGY:    ASSESSMENT AND PLAN:  Pancreatic cancer metastasized to lung [facility]) Initially diagnosed in 08/2008. Treated with Whipple by Dr. Eugenia [name] at [facility]. She received adjuvant chemotherapy with Gemcitabine x 6 months, completing in 08/2009. Found to have metastatic lung disease and underwent wedge resection in 08/2009 for this metastatic disease. Underwent additional left lung resection of lesion felt to be lung primary malignancy with Dr. Arlyce [name] in 05/2010. Underwent SBRT to lung with Dr. Tammi Klippel, which completed on 09/04/11, followed by 5-FU based chemotherapy through 06/13/12. Then was treated with Gemzar/Abraxane for metastatic disease at [facility] in [place] from 02/13/13-05/20/13. Mild progression noted on scans in 06/2014. Gemzar/Abraxane was stopped d/t poor tolerance.  In 03/2015, patient had spontaneous (L) lung pneumothorax. Underwent (L) VATS and mini thoracotomy with path revealing high-grade carcinoma consistent with pancreatic primary. Progression of disease noted on imaging in 09/29/15 and chemotherapy with 5-FU/Leucovorin/Irinotecan started in 09/2015. Again, had spontaneous pneumothorax requiring chest tube placement on 03/05/16 with Dr. Cyndia Bent.  Chemotherapy was held as she recovered and resumed on 04/26/16. Progression of disease noted on CT imaging on 05/09/16, which was likely secondary to being off of therapy x 2 months with  pneumothorax.   Pre-treatment labs today: CBC diff, CMET, CA 19-9.  I personally reviewed and went over laboratory results with the patient.  The results are noted within this dictation.  Labs satisfy treatment parameters.  Nursing, in accordance with chemotherapy administration protocol, will monitor for acute side effects/toxicities associated with chemotherapy administration today.  Bilirubin is noted to be mildly elevated.  Other liver tests WNL.  Etiology unclear at this time.  No dosing change needed for liposomal irinotecan.  Will move on with treatment as planned.  Discussed with Dr. Talbert Cage who agrees.  Labs in 2 weeks: CBC diff, CMET. Labs in 4 weeks: CBC diff, CMET, CA 19-9.  Restaging imaging will be due in September 2018, sooner if clinically indicated.  Return in 4 weeks for follow-up.  ORDERS PLACED FOR THIS ENCOUNTER: Orders Placed This Encounter  Procedures  . CBC with Differential  . Comprehensive metabolic panel  . Cancer antigen 19-9  . CBC with Differential  . Comprehensive metabolic panel  . CBC with Differential  . Comprehensive metabolic panel  . Cancer antigen 19-9    MEDICATIONS PRESCRIBED THIS ENCOUNTER: No orders of the defined types were placed in this encounter.   THERAPY PLAN:  Continue with palliative treatment as outlined above.  All questions were answered. The patient knows to call the clinic with any problems, questions or concerns. We can certainly see the patient much sooner if necessary.  Patient and plan discussed with Dr. Twana First and she is in agreement with the aforementioned.   This note is electronically signed by: Doy Mince 09/13/2016 10:52 AM

## 2016-09-12 NOTE — Assessment & Plan Note (Addendum)
Initially diagnosed in 08/2008. Treated with Whipple by Dr. Eugenia Pancoast at [facility]. She received adjuvant chemotherapy with Gemcitabine x 6 months, completing in 08/2009. Found to have metastatic lung disease and underwent wedge resection in 08/2009 for this metastatic disease. Underwent additional left lung resection of lesion felt to be lung primary malignancy with Dr. Arlyce Dice in 05/2010. Underwent SBRT to lung with Dr. Tammi Klippel, which completed on 09/04/11, followed by 5-FU based chemotherapy through 06/13/12. Then was treated with Gemzar/Abraxane for metastatic disease at [facility] [place] [facility] from 02/13/13-05/20/13. Mild progression noted on scans in 06/2014. Gemzar/Abraxane was stopped d/t poor tolerance.  In 03/2015, patient had spontaneous (L) lung pneumothorax. Underwent (L) VATS and mini thoracotomy with path revealing high-grade carcinoma consistent with pancreatic primary. Progression of disease noted on imaging in 09/29/15 and chemotherapy with 5-FU/Leucovorin/Irinotecan started in 09/2015. Again, had spontaneous pneumothorax requiring chest tube placement on 03/05/16 with Dr. Cyndia Bent.  Chemotherapy was held as she recovered and resumed on 04/26/16. Progression of disease noted on CT imaging on 05/09/16, which was likely secondary to being off of therapy x 2 months with pneumothorax.   Pre-treatment labs today: CBC diff, CMET, CA 19-9.  I personally reviewed and went over laboratory results with the patient.  The results are noted within this dictation.  Labs satisfy treatment parameters.  Nursing, in accordance with chemotherapy administration protocol, will monitor for acute side effects/toxicities associated with chemotherapy administration today.  Bilirubin is noted to be mildly elevated.  Other liver tests WNL.  Etiology unclear at this time.  No dosing change needed for liposomal irinotecan.  Will move on with treatment as planned.  Discussed with Dr. Talbert Cage who  agrees.  Labs in 2 weeks: CBC diff, CMET. Labs in 4 weeks: CBC diff, CMET, CA 19-9.  Restaging imaging will be due in September 2018, sooner if clinically indicated.  Return in 4 weeks for follow-up.

## 2016-09-13 ENCOUNTER — Encounter (HOSPITAL_BASED_OUTPATIENT_CLINIC_OR_DEPARTMENT_OTHER): Payer: Medicare Other | Admitting: Oncology

## 2016-09-13 ENCOUNTER — Encounter (HOSPITAL_BASED_OUTPATIENT_CLINIC_OR_DEPARTMENT_OTHER): Payer: Medicare Other

## 2016-09-13 VITALS — BP 151/55 | HR 68 | Temp 98.7°F | Resp 20 | Wt 118.4 lb

## 2016-09-13 DIAGNOSIS — C7802 Secondary malignant neoplasm of left lung: Secondary | ICD-10-CM

## 2016-09-13 DIAGNOSIS — C259 Malignant neoplasm of pancreas, unspecified: Secondary | ICD-10-CM

## 2016-09-13 DIAGNOSIS — R17 Unspecified jaundice: Secondary | ICD-10-CM

## 2016-09-13 DIAGNOSIS — C7800 Secondary malignant neoplasm of unspecified lung: Principal | ICD-10-CM

## 2016-09-13 DIAGNOSIS — Z5111 Encounter for antineoplastic chemotherapy: Secondary | ICD-10-CM

## 2016-09-13 DIAGNOSIS — R197 Diarrhea, unspecified: Secondary | ICD-10-CM | POA: Diagnosis not present

## 2016-09-13 DIAGNOSIS — Z95828 Presence of other vascular implants and grafts: Secondary | ICD-10-CM | POA: Diagnosis not present

## 2016-09-13 DIAGNOSIS — D469 Myelodysplastic syndrome, unspecified: Secondary | ICD-10-CM | POA: Diagnosis not present

## 2016-09-13 LAB — COMPREHENSIVE METABOLIC PANEL
ALK PHOS: 123 U/L (ref 38–126)
ALT: 9 U/L — AB (ref 14–54)
ANION GAP: 7 (ref 5–15)
AST: 14 U/L — ABNORMAL LOW (ref 15–41)
Albumin: 3.3 g/dL — ABNORMAL LOW (ref 3.5–5.0)
BILIRUBIN TOTAL: 1.7 mg/dL — AB (ref 0.3–1.2)
BUN: 18 mg/dL (ref 6–20)
CALCIUM: 8.5 mg/dL — AB (ref 8.9–10.3)
CO2: 20 mmol/L — ABNORMAL LOW (ref 22–32)
CREATININE: 1.16 mg/dL — AB (ref 0.44–1.00)
Chloride: 110 mmol/L (ref 101–111)
GFR, EST AFRICAN AMERICAN: 51 mL/min — AB (ref >60)
GFR, EST NON AFRICAN AMERICAN: 44 mL/min — AB (ref >60)
Glucose, Bld: 124 mg/dL — ABNORMAL HIGH (ref 65–99)
Potassium: 4.4 mmol/L (ref 3.5–5.1)
Sodium: 137 mmol/L (ref 135–145)
TOTAL PROTEIN: 6.2 g/dL — AB (ref 6.5–8.1)

## 2016-09-13 LAB — CBC WITH DIFFERENTIAL/PLATELET
Basophils Absolute: 0 10*3/uL (ref 0.0–0.1)
Basophils Relative: 0 %
Eosinophils Absolute: 0 10*3/uL (ref 0.0–0.7)
Eosinophils Relative: 0 %
HCT: 31.9 % — ABNORMAL LOW (ref 36.0–46.0)
HEMOGLOBIN: 10 g/dL — AB (ref 12.0–15.0)
LYMPHS ABS: 0.8 10*3/uL (ref 0.7–4.0)
LYMPHS PCT: 8 %
MCH: 30.8 pg (ref 26.0–34.0)
MCHC: 31.3 g/dL (ref 30.0–36.0)
MCV: 98.2 fL (ref 78.0–100.0)
MONOS PCT: 13 %
Monocytes Absolute: 1.3 10*3/uL — ABNORMAL HIGH (ref 0.1–1.0)
NEUTROS PCT: 79 %
Neutro Abs: 7.9 10*3/uL — ABNORMAL HIGH (ref 1.7–7.7)
Platelets: 137 10*3/uL — ABNORMAL LOW (ref 150–400)
RBC: 3.25 MIL/uL — AB (ref 3.87–5.11)
RDW: 17.5 % — ABNORMAL HIGH (ref 11.5–15.5)
WBC: 10 10*3/uL (ref 4.0–10.5)

## 2016-09-13 MED ORDER — PALONOSETRON HCL INJECTION 0.25 MG/5ML
INTRAVENOUS | Status: AC
  Filled 2016-09-13: qty 5

## 2016-09-13 MED ORDER — SODIUM CHLORIDE 0.9% FLUSH: 10.0000 mL | INTRAVENOUS | Status: DC | PRN

## 2016-09-13 MED ORDER — HEPARIN SOD (PORK) LOCK FLUSH 100 UNIT/ML IV SOLN
500.0000 [IU] | Freq: Once | INTRAVENOUS | Status: DC | PRN
  Filled 2016-09-13: qty 5

## 2016-09-13 MED ORDER — PROCHLORPERAZINE MALEATE 10 MG PO TABS
10.0000 mg | ORAL_TABLET | Freq: Once | ORAL | Status: AC
  Administered 2016-09-13: 10 mg via ORAL

## 2016-09-13 MED ORDER — LEUCOVORIN CALCIUM INJECTION 350 MG
400.0000 mg/m2 | Freq: Once | INTRAMUSCULAR | Status: AC
  Administered 2016-09-13: 652 mg via INTRAVENOUS
  Filled 2016-09-13: qty 32.6

## 2016-09-13 MED ORDER — FLUOROURACIL CHEMO INJECTION 5 GM/100ML
1500.0000 mg/m2 | INTRAVENOUS | Status: DC
  Administered 2016-09-13: 2450 mg via INTRAVENOUS
  Filled 2016-09-13: qty 49

## 2016-09-13 MED ORDER — IRINOTECAN HCL LIPOSOME CHEMO INJECTION 43 MG/10ML
35.0000 mg/m2 | INJECTION | Freq: Once | INTRAVENOUS | Status: AC
  Administered 2016-09-13: 55.9 mg via INTRAVENOUS
  Filled 2016-09-13: qty 13

## 2016-09-13 MED ORDER — SODIUM CHLORIDE 0.9 % IV SOLN
Freq: Once | INTRAVENOUS | Status: AC
  Administered 2016-09-13: 11:00:00 via INTRAVENOUS

## 2016-09-13 MED ORDER — PROCHLORPERAZINE MALEATE 10 MG PO TABS
ORAL_TABLET | ORAL | Status: AC
  Filled 2016-09-13: qty 1

## 2016-09-13 MED ORDER — PALONOSETRON HCL INJECTION 0.25 MG/5ML
0.2500 mg | Freq: Once | INTRAVENOUS | Status: AC
  Administered 2016-09-13: 0.25 mg via INTRAVENOUS

## 2016-09-13 NOTE — Patient Instructions (Signed)
[facility] Discharge Instructions for Patients Receiving Chemotherapy   Beginning January 23rd 2017 lab work for the [facility] will be done in the  Main lab at [facility] on 1st floor. If you have a lab appointment with the [facility] please come in thru the  Main Entrance and check in at the main information desk   Today you received the following chemotherapy agents: Irinotecan, Leucovorin, Flurourocil You will go home with your home infusion pump.    To help prevent nausea and vomiting after your treatment, we encourage you to take your nausea medication as prescribed.   If you develop nausea and vomiting, or diarrhea that is not controlled by your medication, call the clinic.  The clinic phone number is [phone]. Office hours are Monday-Friday 8:30am-5:00pm.  BELOW ARE SYMPTOMS THAT SHOULD BE REPORTED IMMEDIATELY:  *FEVER GREATER THAN 101.0 F  *CHILLS WITH OR WITHOUT FEVER  NAUSEA AND VOMITING THAT IS NOT CONTROLLED WITH YOUR NAUSEA MEDICATION  *UNUSUAL SHORTNESS OF BREATH  *UNUSUAL BRUISING OR BLEEDING  TENDERNESS IN MOUTH AND THROAT WITH OR WITHOUT PRESENCE OF ULCERS  *URINARY PROBLEMS  *BOWEL PROBLEMS  UNUSUAL RASH Items with * indicate a potential emergency and should be followed up as soon as possible. If you have an emergency after office hours please contact your primary care physician or go to the nearest emergency department.  Please call the clinic during office hours if you have any questions or concerns.   You may also contact the Patient Navigator at [phone] should you have any questions or need assistance in obtaining follow up care.      Resources For Cancer Patients and their Caregivers ? American Cancer Society: Can assist with transportation, wigs, general needs, runs Look Good Feel Better.        ([phone] ? Cancer Care: Provides financial assistance, online support groups, medication/co-pay  assistance.  1-800-813-HOPE 650-783-1632) ? [facility] Assists [place] cancer patients and their families through emotional , educational and financial support.  [phone] ? [place] DSS Where to apply for food stamps, Medicaid and utility assistance. 212-566-2138 ? RCATS: Transportation to medical appointments. (832)333-4514 ? Social Security Administration: May apply for disability if have a Stage IV cancer. 703-539-7498 (219) [phone] ? [employer], Disability and Transit Services: Assists with nutrition, care and transit needs. [phone]

## 2016-09-13 NOTE — Progress Notes (Signed)
Patient tolerated infusion without any problems. Patient discharged ambulatory and in stable condition. 26fu pump connected and infusing without any problems. Patient to return Thursday to have pump removed.

## 2016-09-13 NOTE — Treatment Plan (Signed)
Ok to treat today with Chemotherapy w bili 1.7.n per Kirby Crigler

## 2016-09-13 NOTE — Patient Instructions (Signed)
[place] at [facility] Discharge Instructions  RECOMMENDATIONS MADE BY THE CONSULTANT AND ANY TEST RESULTS WILL BE SENT TO YOUR REFERRING PHYSICIAN.  You were seen today by Kirby Crigler PA-C. Treatment today. Labs and treatment every 2 weeks. Return in 4 weeks for follow up.   Thank you for choosing [place] at [facility] to provide your oncology and hematology care.  To afford each patient quality time with our provider, please arrive at least 15 minutes before your scheduled appointment time.    If you have a lab appointment with the [facility] please come in thru the  Main Entrance and check in at the main information desk  You need to re-schedule your appointment should you arrive 10 or more minutes late.  We strive to give you quality time with our providers, and arriving late affects you and other patients whose appointments are after yours.  Also, if you no show three or more times for appointments you may be dismissed from the clinic at the providers discretion.     Again, thank you for choosing [facility].  Our hope is that these requests will decrease the amount of time that you wait before being seen by our physicians.       _____________________________________________________________  Should you have questions after your visit to [facility], please contact our office at [phone] between the hours of 8:30 a.m. and 4:30 p.m.  Voicemails left after 4:30 p.m. will not be returned until the following business day.  For prescription refill requests, have your pharmacy contact our office.       Resources For Cancer Patients and their Caregivers ? American Cancer Society: Can assist with transportation, wigs, general needs, runs Look Good Feel Better.        8707771808 ? Cancer Care: Provides financial assistance, online support groups, medication/co-pay assistance.  1-800-813-HOPE  743-574-7269) ? [facility] Assists [place] cancer patients and their families through emotional , educational and financial support.  (802) 167-7866 ? [place] DSS Where to apply for food stamps, Medicaid and utility assistance. 4372976076 ? RCATS: Transportation to medical appointments. (306) 620-2752 ? Social Security Administration: May apply for disability if have a Stage IV cancer. ([phone] 613-1208 519-098-3064 ? [employer], Disability and Transit Services: Assists with nutrition, care and transit needs. [place] Support Programs: @10RELATIVEDAYS @ > Cancer Support Group  2nd Tuesday of the month 1pm-2pm, Journey Room  > Creative Journey  3rd Tuesday of the month 1130am-1pm, Journey Room  > Look Good Feel Better  1st Wednesday of the month 10am-12 noon, Journey Room (Call [place] to register [phone])

## 2016-09-14 ENCOUNTER — Other Ambulatory Visit (HOSPITAL_COMMUNITY): Payer: Self-pay | Admitting: Oncology

## 2016-09-14 DIAGNOSIS — C259 Malignant neoplasm of pancreas, unspecified: Secondary | ICD-10-CM

## 2016-09-14 DIAGNOSIS — C7800 Secondary malignant neoplasm of unspecified lung: Principal | ICD-10-CM

## 2016-09-14 LAB — CANCER ANTIGEN 19-9: CA 19-9: 846 U/mL — ABNORMAL HIGH (ref 0–35)

## 2016-09-15 ENCOUNTER — Encounter (HOSPITAL_BASED_OUTPATIENT_CLINIC_OR_DEPARTMENT_OTHER): Payer: Medicare Other

## 2016-09-15 ENCOUNTER — Encounter (HOSPITAL_COMMUNITY): Payer: Self-pay

## 2016-09-15 VITALS — BP 123/57 | HR 52 | Temp 97.9°F | Resp 18

## 2016-09-15 DIAGNOSIS — Z452 Encounter for adjustment and management of vascular access device: Secondary | ICD-10-CM

## 2016-09-15 DIAGNOSIS — C259 Malignant neoplasm of pancreas, unspecified: Secondary | ICD-10-CM | POA: Diagnosis not present

## 2016-09-15 DIAGNOSIS — C7800 Secondary malignant neoplasm of unspecified lung: Principal | ICD-10-CM

## 2016-09-15 DIAGNOSIS — C7802 Secondary malignant neoplasm of left lung: Secondary | ICD-10-CM | POA: Diagnosis not present

## 2016-09-15 MED ORDER — SODIUM CHLORIDE 0.9% FLUSH
10.0000 mL | INTRAVENOUS | Status: DC | PRN
  Administered 2016-09-15: 10 mL
  Filled 2016-09-15: qty 10

## 2016-09-15 MED ORDER — HEPARIN SOD (PORK) LOCK FLUSH 100 UNIT/ML IV SOLN
500.0000 [IU] | Freq: Once | INTRAVENOUS | Status: AC | PRN
  Administered 2016-09-15: 500 [IU]

## 2016-09-15 NOTE — Patient Instructions (Signed)
[place] at [facility] Discharge Instructions  RECOMMENDATIONS MADE BY THE CONSULTANT AND ANY TEST RESULTS WILL BE SENT TO YOUR REFERRING PHYSICIAN.  5FU pump discontinued today with port flushed per protocol. Follow-up as scheduled. Call clinic for any questions or concerns  Thank you for choosing [place] at [facility] to provide your oncology and hematology care.  To afford each patient quality time with our provider, please arrive at least 15 minutes before your scheduled appointment time.    If you have a lab appointment with the [facility] please come in thru the  Main Entrance and check in at the main information desk  You need to re-schedule your appointment should you arrive 10 or more minutes late.  We strive to give you quality time with our providers, and arriving late affects you and other patients whose appointments are after yours.  Also, if you no show three or more times for appointments you may be dismissed from the clinic at the providers discretion.     Again, thank you for choosing [facility].  Our hope is that these requests will decrease the amount of time that you wait before being seen by our physicians.       _____________________________________________________________  Should you have questions after your visit to [facility], please contact our office at [phone] between the hours of 8:30 a.m. and 4:30 p.m.  Voicemails left after 4:30 p.m. will not be returned until the following business day.  For prescription refill requests, have your pharmacy contact our office.       Resources For Cancer Patients and their Caregivers ? American Cancer Society: Can assist with transportation, wigs, general needs, runs Look Good Feel Better.        514-464-1507 ? Cancer Care: Provides financial assistance, online support groups, medication/co-pay assistance.  1-800-813-HOPE  (318)709-3823) ? [facility] Assists [place] cancer patients and their families through emotional , educational and financial support.  (202)262-2669 ? [place] DSS Where to apply for food stamps, Medicaid and utility assistance. (253)309-7028 ? RCATS: Transportation to medical appointments. (614)572-7447 ? Social Security Administration: May apply for disability if have a Stage IV cancer. 402-571-6685 8256719529 ? [employer], Disability and Transit Services: Assists with nutrition, care and transit needs. [place] Support Programs: @10RELATIVEDAYS @ > Cancer Support Group  2nd Tuesday of the month 1pm-2pm, Journey Room  > Creative Journey  3rd Tuesday of the month 1130am-1pm, Journey Room  > Look Good Feel Better  1st Wednesday of the month 10am-12 noon, Journey Room (Call [place] to register [phone])

## 2016-09-15 NOTE — Progress Notes (Signed)
[name] [name] tolerated discontinuation of 5FU pump with port flush well without issues. 5FU pump discontinued and port flushed with 10 ml NS and 5 ml Heparin easily per protocol then de-accessed. VSS Pt discharged self ambulatory in satisfactory condition

## 2016-09-20 ENCOUNTER — Telehealth (HOSPITAL_COMMUNITY): Payer: Self-pay | Admitting: Emergency Medicine

## 2016-09-20 NOTE — Telephone Encounter (Signed)
Pt called and stated that she just didn't bounce back this time after chemo.  She had a little more nausea and diarrhea.  She is taking her nausea and antidiarrheal medications as needed.  She wanted to take a week or two break.  I told her that we could let her take a week break.  She was asking about her cancer marker and was the chemo really working.  I told her that we could not always rely on the cancer marker lab work itself. If it keeps increasing we would do scans again.  She said well my scans always look ok.  I explained the chemo is doing its job still then at this point and that what we want.  She is really fixated on the cancer marker number.  Kept trying to reassure her that this is not always reliable in how well a person is responding.  I have moved her chemo out 1 week to allow her time to recover from her last chemo.  She is very [employer] attorney.

## 2016-09-27 ENCOUNTER — Ambulatory Visit (HOSPITAL_COMMUNITY): Payer: Medicare Other

## 2016-09-29 ENCOUNTER — Encounter (HOSPITAL_COMMUNITY): Payer: Medicare Other

## 2016-10-03 ENCOUNTER — Other Ambulatory Visit (HOSPITAL_COMMUNITY): Payer: Self-pay | Admitting: Oncology

## 2016-10-04 ENCOUNTER — Encounter (HOSPITAL_COMMUNITY): Payer: Medicare Other | Attending: Adult Health

## 2016-10-04 ENCOUNTER — Encounter (HOSPITAL_COMMUNITY): Payer: Self-pay

## 2016-10-04 VITALS — BP 98/70 | HR 72 | Temp 98.1°F | Resp 18 | Wt 114.6 lb

## 2016-10-04 DIAGNOSIS — R197 Diarrhea, unspecified: Secondary | ICD-10-CM | POA: Diagnosis not present

## 2016-10-04 DIAGNOSIS — C7800 Secondary malignant neoplasm of unspecified lung: Secondary | ICD-10-CM | POA: Diagnosis not present

## 2016-10-04 DIAGNOSIS — Z5111 Encounter for antineoplastic chemotherapy: Secondary | ICD-10-CM

## 2016-10-04 DIAGNOSIS — C7802 Secondary malignant neoplasm of left lung: Secondary | ICD-10-CM | POA: Diagnosis not present

## 2016-10-04 DIAGNOSIS — C259 Malignant neoplasm of pancreas, unspecified: Secondary | ICD-10-CM

## 2016-10-04 DIAGNOSIS — Z95828 Presence of other vascular implants and grafts: Secondary | ICD-10-CM | POA: Diagnosis not present

## 2016-10-04 DIAGNOSIS — E860 Dehydration: Secondary | ICD-10-CM

## 2016-10-04 LAB — COMPREHENSIVE METABOLIC PANEL
ALBUMIN: 3.2 g/dL — AB (ref 3.5–5.0)
ALK PHOS: 113 U/L (ref 38–126)
ALT: 21 U/L (ref 14–54)
AST: 31 U/L (ref 15–41)
Anion gap: 8 (ref 5–15)
BILIRUBIN TOTAL: 1.2 mg/dL (ref 0.3–1.2)
BUN: 21 mg/dL — AB (ref 6–20)
CALCIUM: 8.9 mg/dL (ref 8.9–10.3)
CO2: 20 mmol/L — AB (ref 22–32)
Chloride: 107 mmol/L (ref 101–111)
Creatinine, Ser: 1.13 mg/dL — ABNORMAL HIGH (ref 0.44–1.00)
GFR calc Af Amer: 53 mL/min — ABNORMAL LOW (ref >60)
GFR calc non Af Amer: 45 mL/min — ABNORMAL LOW (ref >60)
GLUCOSE: 125 mg/dL — AB (ref 65–99)
Potassium: 3.9 mmol/L (ref 3.5–5.1)
SODIUM: 135 mmol/L (ref 135–145)
TOTAL PROTEIN: 6.6 g/dL (ref 6.5–8.1)

## 2016-10-04 LAB — CBC WITH DIFFERENTIAL/PLATELET
BASOS ABS: 0 10*3/uL (ref 0.0–0.1)
BASOS PCT: 0 %
Eosinophils Absolute: 0 10*3/uL (ref 0.0–0.7)
Eosinophils Relative: 0 %
HEMATOCRIT: 30.6 % — AB (ref 36.0–46.0)
HEMOGLOBIN: 9.9 g/dL — AB (ref 12.0–15.0)
Lymphocytes Relative: 15 %
Lymphs Abs: 1.1 10*3/uL (ref 0.7–4.0)
MCH: 30.7 pg (ref 26.0–34.0)
MCHC: 32.4 g/dL (ref 30.0–36.0)
MCV: 95 fL (ref 78.0–100.0)
MONOS PCT: 15 %
Monocytes Absolute: 1.1 10*3/uL — ABNORMAL HIGH (ref 0.1–1.0)
NEUTROS ABS: 5.2 10*3/uL (ref 1.7–7.7)
NEUTROS PCT: 70 %
Platelets: 152 10*3/uL (ref 150–400)
RBC: 3.22 MIL/uL — AB (ref 3.87–5.11)
RDW: 15.6 % — ABNORMAL HIGH (ref 11.5–15.5)
WBC: 7.4 10*3/uL (ref 4.0–10.5)

## 2016-10-04 MED ORDER — IRINOTECAN HCL LIPOSOME CHEMO INJECTION 43 MG/10ML
35.0000 mg/m2 | INJECTION | Freq: Once | INTRAVENOUS | Status: AC
  Administered 2016-10-04: 55.9 mg via INTRAVENOUS
  Filled 2016-10-04: qty 13

## 2016-10-04 MED ORDER — SODIUM CHLORIDE 0.9 % IV SOLN
1500.0000 mg/m2 | INTRAVENOUS | Status: DC
  Administered 2016-10-04: 2450 mg via INTRAVENOUS
  Filled 2016-10-04: qty 49

## 2016-10-04 MED ORDER — PALONOSETRON HCL INJECTION 0.25 MG/5ML
0.2500 mg | Freq: Once | INTRAVENOUS | Status: AC
  Administered 2016-10-04: 0.25 mg via INTRAVENOUS
  Filled 2016-10-04: qty 5

## 2016-10-04 MED ORDER — PROCHLORPERAZINE MALEATE 10 MG PO TABS
10.0000 mg | ORAL_TABLET | Freq: Once | ORAL | Status: AC
  Administered 2016-10-04: 10 mg via ORAL
  Filled 2016-10-04: qty 1

## 2016-10-04 MED ORDER — LEUCOVORIN CALCIUM INJECTION 350 MG
400.0000 mg/m2 | Freq: Once | INTRAVENOUS | Status: AC
  Administered 2016-10-04: 652 mg via INTRAVENOUS
  Filled 2016-10-04: qty 32.6

## 2016-10-04 MED ORDER — HEPARIN SOD (PORK) LOCK FLUSH 100 UNIT/ML IV SOLN
500.0000 [IU] | Freq: Once | INTRAVENOUS | Status: DC | PRN
  Filled 2016-10-04: qty 5

## 2016-10-04 MED ORDER — SODIUM CHLORIDE 0.9 % IV SOLN
Freq: Once | INTRAVENOUS | Status: AC
  Administered 2016-10-04: 12:00:00 via INTRAVENOUS

## 2016-10-04 MED ORDER — SODIUM CHLORIDE 0.9% FLUSH
10.0000 mL | INTRAVENOUS | Status: DC | PRN
  Administered 2016-10-04: 10 mL
  Filled 2016-10-04: qty 10

## 2016-10-04 MED ORDER — SODIUM CHLORIDE 0.9 % IV SOLN
INTRAVENOUS | Status: DC
  Administered 2016-10-04: 10:00:00 via INTRAVENOUS

## 2016-10-04 NOTE — Progress Notes (Signed)
Safiyyah Haralson"s labs reviewed with Mike Craze, NP, with orders to hydrate with NACL 1000 ml over 2 hours.  OK to treat today with chemotherapy.    Patient tolerated chemotherapy with no complaints. VSS with discharge.  Discharged in stable condition with family and ambulatory.  Patient discharged with 5FU pump with no issues.

## 2016-10-04 NOTE — Patient Instructions (Signed)
[place] at [facility]  Discharge Instructions:  Received chemotherapy today.  Keep scheduled appointments and call for any questions or concerns.  _______________________________________________________________  Thank you for choosing [place] at [facility] to provide your oncology and hematology care.  To afford each patient quality time with our providers, please arrive at least 15 minutes before your scheduled appointment.  You need to re-schedule your appointment if you arrive 10 or more minutes late.  We strive to give you quality time with our providers, and arriving late affects you and other patients whose appointments are after yours.  Also, if you no show three or more times for appointments you may be dismissed from the clinic.  Again, thank you for choosing [place] at [facility] hope is that these requests will allow you access to exceptional care and in a timely manner. _______________________________________________________________  If you have questions after your visit, please contact our office at [phone] between the hours of 8:30 a.m. and 5:00 p.m. Voicemails left after 4:30 p.m. will not be returned until the following business day. _______________________________________________________________  For prescription refill requests, have your pharmacy contact our office. _______________________________________________________________  Recommendations made by the consultant and any test results will be sent to your referring physician. _______________________________________________________________

## 2016-10-05 LAB — CANCER ANTIGEN 19-9: CA 19-9: 685 U/mL — ABNORMAL HIGH (ref 0–35)

## 2016-10-06 ENCOUNTER — Encounter (HOSPITAL_COMMUNITY): Payer: Medicare Other | Attending: Adult Health

## 2016-10-06 ENCOUNTER — Encounter (HOSPITAL_COMMUNITY): Payer: Self-pay

## 2016-10-06 VITALS — BP 116/57 | HR 63 | Temp 98.0°F | Resp 18

## 2016-10-06 DIAGNOSIS — C7802 Secondary malignant neoplasm of left lung: Secondary | ICD-10-CM

## 2016-10-06 DIAGNOSIS — Z452 Encounter for adjustment and management of vascular access device: Secondary | ICD-10-CM | POA: Diagnosis present

## 2016-10-06 DIAGNOSIS — C259 Malignant neoplasm of pancreas, unspecified: Secondary | ICD-10-CM

## 2016-10-06 DIAGNOSIS — C7800 Secondary malignant neoplasm of unspecified lung: Principal | ICD-10-CM

## 2016-10-06 MED ORDER — HEPARIN SOD (PORK) LOCK FLUSH 100 UNIT/ML IV SOLN
500.0000 [IU] | Freq: Once | INTRAVENOUS | Status: AC | PRN
  Administered 2016-10-06: 500 [IU]

## 2016-10-06 MED ORDER — SODIUM CHLORIDE 0.9% FLUSH
10.0000 mL | INTRAVENOUS | Status: DC | PRN
  Administered 2016-10-06: 10 mL
  Filled 2016-10-06: qty 10

## 2016-10-06 NOTE — Progress Notes (Signed)
[name] [name] presents to have home infusion pump d/c'd and for port-a-cath deaccess with flush.  Proper placement of portacath confirmed by CXR.  Portacath located right chest wall accessed with  H 20 needle.  Good blood return present. Portacath flushed with NS and 500U/57ml Heparin, and needle removed intact.  Procedure tolerated well and without incident.  Discharged ambulatory.

## 2016-10-11 ENCOUNTER — Ambulatory Visit (HOSPITAL_COMMUNITY): Payer: Medicare Other

## 2016-10-13 ENCOUNTER — Other Ambulatory Visit (HOSPITAL_COMMUNITY): Payer: Self-pay | Admitting: Oncology

## 2016-10-13 ENCOUNTER — Encounter (HOSPITAL_COMMUNITY): Payer: Medicare Other

## 2016-10-13 DIAGNOSIS — C7800 Secondary malignant neoplasm of unspecified lung: Secondary | ICD-10-CM | POA: Diagnosis not present

## 2016-10-13 DIAGNOSIS — I10 Essential (primary) hypertension: Secondary | ICD-10-CM | POA: Diagnosis not present

## 2016-10-13 DIAGNOSIS — J069 Acute upper respiratory infection, unspecified: Secondary | ICD-10-CM | POA: Diagnosis not present

## 2016-10-13 DIAGNOSIS — Z681 Body mass index (BMI) 19 or less, adult: Secondary | ICD-10-CM | POA: Diagnosis not present

## 2016-10-13 DIAGNOSIS — C3490 Malignant neoplasm of unspecified part of unspecified bronchus or lung: Secondary | ICD-10-CM | POA: Diagnosis not present

## 2016-10-13 DIAGNOSIS — Z299 Encounter for prophylactic measures, unspecified: Secondary | ICD-10-CM | POA: Diagnosis not present

## 2016-10-13 DIAGNOSIS — C259 Malignant neoplasm of pancreas, unspecified: Secondary | ICD-10-CM | POA: Diagnosis not present

## 2016-10-18 ENCOUNTER — Encounter (HOSPITAL_BASED_OUTPATIENT_CLINIC_OR_DEPARTMENT_OTHER): Payer: Medicare Other

## 2016-10-18 ENCOUNTER — Encounter (HOSPITAL_BASED_OUTPATIENT_CLINIC_OR_DEPARTMENT_OTHER): Payer: Medicare Other | Admitting: Adult Health

## 2016-10-18 ENCOUNTER — Encounter (HOSPITAL_COMMUNITY): Payer: Self-pay

## 2016-10-18 ENCOUNTER — Encounter (HOSPITAL_COMMUNITY): Payer: Medicare Other

## 2016-10-18 VITALS — BP 134/48 | HR 48 | Temp 97.9°F | Resp 16 | Wt 112.8 lb

## 2016-10-18 DIAGNOSIS — D6481 Anemia due to antineoplastic chemotherapy: Secondary | ICD-10-CM

## 2016-10-18 DIAGNOSIS — C259 Malignant neoplasm of pancreas, unspecified: Secondary | ICD-10-CM

## 2016-10-18 DIAGNOSIS — C7800 Secondary malignant neoplasm of unspecified lung: Principal | ICD-10-CM

## 2016-10-18 DIAGNOSIS — C7802 Secondary malignant neoplasm of left lung: Secondary | ICD-10-CM

## 2016-10-18 DIAGNOSIS — K1379 Other lesions of oral mucosa: Secondary | ICD-10-CM

## 2016-10-18 DIAGNOSIS — R197 Diarrhea, unspecified: Secondary | ICD-10-CM | POA: Diagnosis not present

## 2016-10-18 DIAGNOSIS — Z5111 Encounter for antineoplastic chemotherapy: Secondary | ICD-10-CM

## 2016-10-18 DIAGNOSIS — Z95828 Presence of other vascular implants and grafts: Secondary | ICD-10-CM | POA: Diagnosis not present

## 2016-10-18 LAB — CBC WITH DIFFERENTIAL/PLATELET
BASOS ABS: 0 10*3/uL (ref 0.0–0.1)
Basophils Relative: 0 %
Eosinophils Absolute: 0 10*3/uL (ref 0.0–0.7)
Eosinophils Relative: 1 %
HEMATOCRIT: 27.7 % — AB (ref 36.0–46.0)
HEMOGLOBIN: 9 g/dL — AB (ref 12.0–15.0)
LYMPHS PCT: 15 %
Lymphs Abs: 1.1 10*3/uL (ref 0.7–4.0)
MCH: 30.7 pg (ref 26.0–34.0)
MCHC: 32.5 g/dL (ref 30.0–36.0)
MCV: 94.5 fL (ref 78.0–100.0)
MONO ABS: 0.6 10*3/uL (ref 0.1–1.0)
Monocytes Relative: 8 %
NEUTROS ABS: 6 10*3/uL (ref 1.7–7.7)
NEUTROS PCT: 76 %
Platelets: 192 10*3/uL (ref 150–400)
RBC: 2.93 MIL/uL — AB (ref 3.87–5.11)
RDW: 15.6 % — AB (ref 11.5–15.5)
WBC: 7.8 10*3/uL (ref 4.0–10.5)

## 2016-10-18 LAB — COMPREHENSIVE METABOLIC PANEL
ALBUMIN: 3.3 g/dL — AB (ref 3.5–5.0)
ALT: 12 U/L — ABNORMAL LOW (ref 14–54)
ANION GAP: 9 (ref 5–15)
AST: 16 U/L (ref 15–41)
Alkaline Phosphatase: 107 U/L (ref 38–126)
BILIRUBIN TOTAL: 0.6 mg/dL (ref 0.3–1.2)
BUN: 17 mg/dL (ref 6–20)
CO2: 15 mmol/L — AB (ref 22–32)
Calcium: 8.8 mg/dL — ABNORMAL LOW (ref 8.9–10.3)
Chloride: 114 mmol/L — ABNORMAL HIGH (ref 101–111)
Creatinine, Ser: 1.17 mg/dL — ABNORMAL HIGH (ref 0.44–1.00)
GFR calc non Af Amer: 43 mL/min — ABNORMAL LOW (ref >60)
GFR, EST AFRICAN AMERICAN: 50 mL/min — AB (ref >60)
GLUCOSE: 88 mg/dL (ref 65–99)
POTASSIUM: 3.5 mmol/L (ref 3.5–5.1)
SODIUM: 138 mmol/L (ref 135–145)
TOTAL PROTEIN: 6.5 g/dL (ref 6.5–8.1)

## 2016-10-18 MED ORDER — PALONOSETRON HCL INJECTION 0.25 MG/5ML
INTRAVENOUS | Status: AC
  Filled 2016-10-18: qty 5

## 2016-10-18 MED ORDER — SODIUM CHLORIDE 0.9 % IV SOLN
Freq: Once | INTRAVENOUS | Status: AC
  Administered 2016-10-18: 12:00:00 via INTRAVENOUS

## 2016-10-18 MED ORDER — PROCHLORPERAZINE MALEATE 10 MG PO TABS
ORAL_TABLET | ORAL | Status: AC
  Filled 2016-10-18: qty 1

## 2016-10-18 MED ORDER — LEUCOVORIN CALCIUM INJECTION 350 MG
400.0000 mg/m2 | Freq: Once | INTRAMUSCULAR | Status: AC
  Administered 2016-10-18: 652 mg via INTRAVENOUS
  Filled 2016-10-18: qty 32.6

## 2016-10-18 MED ORDER — SODIUM CHLORIDE 0.9% FLUSH
10.0000 mL | INTRAVENOUS | Status: DC | PRN
  Administered 2016-10-18: 10 mL
  Filled 2016-10-18: qty 10

## 2016-10-18 MED ORDER — SODIUM CHLORIDE 0.9 % IV SOLN
1500.0000 mg/m2 | INTRAVENOUS | Status: DC
  Administered 2016-10-18: 2450 mg via INTRAVENOUS
  Filled 2016-10-18: qty 49

## 2016-10-18 MED ORDER — PROCHLORPERAZINE MALEATE 10 MG PO TABS
10.0000 mg | ORAL_TABLET | Freq: Once | ORAL | Status: AC
  Administered 2016-10-18: 10 mg via ORAL

## 2016-10-18 MED ORDER — SODIUM CHLORIDE 0.9 % IV SOLN
35.0000 mg/m2 | Freq: Once | INTRAVENOUS | Status: AC
  Administered 2016-10-18: 55.9 mg via INTRAVENOUS
  Filled 2016-10-18: qty 13

## 2016-10-18 MED ORDER — DARBEPOETIN ALFA 500 MCG/ML IJ SOSY
PREFILLED_SYRINGE | INTRAMUSCULAR | Status: AC
Start: 2016-10-18 — End: ?
  Filled 2016-10-18: qty 1

## 2016-10-18 MED ORDER — DARBEPOETIN ALFA 500 MCG/ML IJ SOSY
500.0000 ug | PREFILLED_SYRINGE | Freq: Once | INTRAMUSCULAR | Status: AC
  Administered 2016-10-18: 500 ug via SUBCUTANEOUS

## 2016-10-18 MED ORDER — PALONOSETRON HCL INJECTION 0.25 MG/5ML
0.2500 mg | Freq: Once | INTRAVENOUS | Status: AC
  Administered 2016-10-18: 0.25 mg via INTRAVENOUS

## 2016-10-18 MED ORDER — DIPHENOXYLATE-ATROPINE 2.5-0.025 MG PO TABS: 1.0000 | ORAL_TABLET | Freq: Four times a day (QID) | ORAL | 0 refills | Status: DC | PRN

## 2016-10-18 NOTE — Patient Instructions (Signed)
[facility] Discharge Instructions for Patients Receiving Chemotherapy   Beginning January 23rd 2017 lab work for the Cancer Center will be done in the  Main lab at [facility] on 1st floor. If you have a lab appointment with the Cancer Center please come in thru the  Main Entrance and check in at the main information desk   Today you received the following chemotherapy agents   To help prevent nausea and vomiting after your treatment, we encourage you to take your nausea medication     If you develop nausea and vomiting, or diarrhea that is not controlled by your medication, call the clinic.  The clinic phone number is [phone]. Office hours are Monday-Friday 8:30am-5:00pm.  BELOW ARE SYMPTOMS THAT SHOULD BE REPORTED IMMEDIATELY:  *FEVER GREATER THAN 101.0 F  *CHILLS WITH OR WITHOUT FEVER  NAUSEA AND VOMITING THAT IS NOT CONTROLLED WITH YOUR NAUSEA MEDICATION  *UNUSUAL SHORTNESS OF BREATH  *UNUSUAL BRUISING OR BLEEDING  TENDERNESS IN MOUTH AND THROAT WITH OR WITHOUT PRESENCE OF ULCERS  *URINARY PROBLEMS  *BOWEL PROBLEMS  UNUSUAL RASH Items with * indicate a potential emergency and should be followed up as soon as possible. If you have an emergency after office hours please contact your primary care physician or go to the nearest emergency department.  Please call the clinic during office hours if you have any questions or concerns.   You may also contact the Patient Navigator at [phone] should you have any questions or need assistance in obtaining follow up care.      Resources For Cancer Patients and their Caregivers ? American Cancer Society: Can assist with transportation, wigs, general needs, runs Look Good Feel Better.        1-888-227-6333 ? Cancer Care: Provides financial assistance, online support groups, medication/co-pay assistance.  1-800-813-HOPE (4673) ? [facility] Assists [place] cancer  patients and their families through emotional , educational and financial support.  336-427-4357 ? [place] DSS Where to apply for food stamps, Medicaid and utility assistance. 336-342-1394 ? [employer]: Transportation to medical appointments. 336-347-2287 ? Social Security Administration: May apply for disability if have a Stage IV cancer. 336-342-7796 1-800-772-1213 ? [place] Aging, Disability and Transit Services: Assists with nutrition, care and transit needs. [phone]         

## 2016-10-18 NOTE — Progress Notes (Signed)
[place], [place] [address]   CLINIC:  Medical Oncology/Hematology  PCP:  Monico Blitz, MD [place] [address] [phone]   REASON FOR VISIT:  Follow-up for Stage IV adenocarcinoma of pancreas with lung mets   CURRENT THERAPY: 5-FU/Leucovorin/Liposomal Irinotecan, resumed 04/26/16 (palliative treatment)   BRIEF ONCOLOGIC HISTORY:    Pancreatic cancer metastasized to lung [facility])   09/03/2008 Surgery    Whipple with Dr. Birdie Sons for T3N1 3/12 LN+ Pancreatic Adenocarcinoma      11/13/2008 - 09/12/2009 Chemotherapy    Adjuvant Gemcitabine X 6 months      08/31/2009 Surgery    Metastectomy with Dr. Arlyce Dice RUL 2 nodules, RLL (wedge resection X 2)      08/31/2009 Pathology Results    Metastatic adenocarcinoma TTF-1 negative      06/03/2010 Surgery    Left video-assisted thoracoscopic surgery, resection of left lower lobe lesion. with Dr. Arlyce Dice      06/04/2010 Pathology Results    adenocarcinoma positive for cytokeratin 7, focally positive for CDX2, with scattered staining for TTF1 cytokeratin 20. [facility] opinion felt to be lung primary although noted to stain for GI markers, not felt to be pancreatic primary. EGFR and ALK negative      03/31/2011 - 09/04/2011 Radiation Therapy    SBRT LUL 18Gy Dr. Tammi Klippel      03/15/2012 - 06/13/2012 Chemotherapy    5-FU based chemotherapy      02/13/2013 - 05/20/2013 Chemotherapy    Gemzar Abraxane for metastatic disease (given to cover both primaries at [facility])      07/15/2014 PET scan    Mild progression of pulm mets, hypermet mesenteric tumor assoc with  SMA is stable to slightly increased in degree of FDG uptake      07/23/2014 Imaging         07/30/2014 - 09/10/2014 Chemotherapy    Gemzar abraxane stopped due to poor tolerance      04/06/2015 Procedure    L 20 French chest tube for spontaneous pneumothorax, by Dr. Tharon Aquas Trigt      04/20/2015 Pathology Results   High grade carcinoma c/w patient's known pancreatic primary (secondary opinion at [facility]) PDL-1 high expression, preserved major and minor MMR, MSI stable, [facility] ONE testing performed,       04/20/2015 Surgery    Left VATS, mini thoracotomy with stapling and oversewing of blebs, Talc pleurodesis. performed secondary to persistent air leak not improved with chest tube drainage      07/29/2015 Imaging    No signif change in appear of multifocal B cavitary pulmonary lesions, postop appearance upper abdomen, no findings of met disease to abd or pelvis,      09/29/2015 Imaging    Slight progression of multifocal cavitary pulmonary metastases, as discussed above. 2. Interval development of a lytic lesion in the antral lateral aspect of the left fifth rib where there is a nondisplaced pathologic fracture. No other definite osseous metastases are identified. 3. Prominent soft tissue adjacent to the proximal superior mesenteric artery and in the aortocaval nodal station, similar to prior examinations, likely to represent treated metastatic lymphadenopathy. 4. Epigastric ventral hernia containing several short loops of small bowel. There is a focally dilated loop of small bowel adjacent to this, however, this is an isolated finding. No other dilatation of small bowel or colon to suggest frank bowel obstruction at this time.       10/19/2015 - 03/01/2016 Chemotherapy  The patient had palonosetron (ALOXI) injection 0.25 mg, 0.25 mg, Intravenous,  Once, 10 of 12 cycles  leucovorin 652 mg in dextrose 5 % 250 mL infusion, 400 mg/m2 = 652 mg, Intravenous,  Once, 10 of 12 cycles  fluorouracil (ADRUCIL) 2,950 mg in sodium chloride 0.9 % 91 mL chemo infusion, 1,800 mg/m2 = 2,950 mg (100 % of original dose 1,800 mg/m2), Intravenous, 1 Day/Dose, 10 of 12 cycles Dose modification: 1,920 mg/m2 (original dose 1,800 mg/m2, Cycle 1, Reason: Provider Judgment), 1,800 mg/m2 (original dose  1,800 mg/m2, Cycle 1, Reason: Provider Judgment), 1,500 mg/m2 (original dose 1,800 mg/m2, Cycle 2, Reason: Dose not tolerated)  irinotecan LIPOSOME (ONIVYDE) 55.9 mg in sodium chloride 0.9 % 500 mL chemo infusion, 35 mg/m2 = 55.9 mg (100 % of original dose 35 mg/m2), Intravenous, Once, 10 of 12 cycles Dose modification: 35 mg/m2 (original dose 35 mg/m2, Cycle 1, Reason: Provider Judgment)  for chemotherapy treatment.        01/12/2016 Imaging    CT CAP- 1. Cavitary lung metastases are stable to decreased in size. 2. Expansile mixed lytic and sclerotic bone metastasis in the anterior left fifth rib is increased in sclerosis and mildly increased in size. 3. Infiltrative soft tissue in the retroperitoneum abutting the SMA is stable. 4. No new sites of metastatic disease in the chest, abdomen or pelvis.      03/05/2016 Procedure    Chest tube placed for spontaneous pneumothorax by Dr. Cyndia Bent      03/05/2016 - 03/10/2016 Hospital Admission    Admit date: 03/05/2016 Admission diagnosis: Spontenous pneumothorax Additional comments: chest tube placed by Dr. Cyndia Bent on 03/05/2016      03/14/2016 - 03/18/2016 Hospital Admission    Admit date: 03/14/2016 Admission diagnosis: Recurrent spontaneous pneumothorax Additional comments:       04/26/2016 -  Chemotherapy    The patient had palonosetron (ALOXI) injection 0.25 mg, 0.25 mg, Intravenous,  Once, 10 of 12 cycles  leucovorin 652 mg in dextrose 5 % 250 mL infusion, 400 mg/m2 = 652 mg, Intravenous,  Once, 10 of 12 cycles  fluorouracil (ADRUCIL) 2,950 mg in sodium chloride 0.9 % 91 mL chemo infusion, 1,800 mg/m2 = 2,950 mg (100 % of original dose 1,800 mg/m2), Intravenous, 1 Day/Dose, 10 of 12 cycles Dose modification: 1,920 mg/m2 (original dose 1,800 mg/m2, Cycle 1, Reason: Provider Judgment), 1,800 mg/m2 (original dose 1,800 mg/m2, Cycle 1, Reason: Provider Judgment), 1,500 mg/m2 (original dose 1,800 mg/m2, Cycle 2, Reason: Dose not  tolerated)  irinotecan LIPOSOME (ONIVYDE) 55.9 mg in sodium chloride 0.9 % 500 mL chemo infusion, 35 mg/m2 = 55.9 mg (100 % of original dose 35 mg/m2), Intravenous, Once, 10 of 12 cycles Dose modification: 35 mg/m2 (original dose 35 mg/m2, Cycle 1, Reason: Provider Judgment)  for chemotherapy treatment.        05/09/2016 Imaging    CT CAP- CT CHEST IMPRESSION  1. Mild progression of pulmonary metastasis. Although the majority of cavitary lung lesions are primarily similar, there are new and increased small cavitary nodules. 2. Trace residual loculated inferolateral right pleural air. 3. No thoracic adenopathy. 4. Progression of anterior left fifth rib metastasis with soft tissue component surrounding.  CT ABDOMEN AND PELVIS IMPRESSION  1. Status post Whipple procedure, without locally recurrent disease. 2. Similar soft tissue fullness within the retroperitoneum. 3. Pneumobilia. Possible mild cirrhosis. 4. Similar small bowel containing ventral abdominal wall laxity. 5. Similar abdominal aortic aneurysm.      05/09/2016 Progression    Progression of disease  after being off therapy x 2 months.      07/26/2016 Imaging    CT CAP- 1. Stable appearance of pulmonary metastases. 2. No thoracic adenopathy. 3. 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 4. Status post Whipple procedure. 5. Similar soft tissue within the retroperitoneum involving the SMA and aorto caval space. 6. Pneumobilia and possible mild cirrhosis. 7. Ventral abdominal wall hernia containing nonobstructed loops of small bowel. 8. Abdominal aortic aneurysm measures 3.7 cm. Unchanged from previous exam. 9. Aortic Atherosclerosis (ICD10-I70.0) and Emphysema (ICD10-J43.9). Coronary artery calcifications noted.      07/26/2016 Imaging    CT head- 1. No metastatic disease or acute intracranial abnormality identified. 2. Recannulized right transverse sinus through right IJ bulb thrombosis since  the 2016 MRI. Small volume of residual chronic nonocclusive thrombus. 3. Mild for age chronic small vessel disease is unchanged since 2016.        INTERVAL HISTORY:  Ms. Ware 76 y.o. female returns to cancer center today for routine follow-up and consideration for next cycle of chemotherapy with 5-FU/Leucovorin/Liposomal Irinotecan.    She has been struggling with cold symptoms for the past week or so. Denies any fever or chills. She has some cough with pale yellow thick sputum. Currently on antibiotics with Amoxicillin from her PCP since last Thursday (about 5 days ago). She thinks she has a few more days of antibiotics left. She has some shortness of breath with exertion; she takes Mucinex off and on which is helpful.  She is feeling better than she did last week; she wants to get chemo today.   Continues to struggle with diarrhea after her chemotherapy. Imodium is only mildly helpful; she is wondering if there is anything else she can take for the diarrhea.  States that with every meal "I have to run to the bathroom."  Reports that she is taking Creon as directed.  She tries to drink plenty of fluids, but admittedly does not think she drinks enough water.   She continues to have some issues with tongue ulcers at times; states that she sometimes "I feel like my tongue is too big for my mouth when I get my pump taken off."  She is using Magic Mouthwash, but it is only minimally helpful.   Overall, she feels ready for her next cycle of chemotherapy today and wants to get treatment.       REVIEW OF SYSTEMS:  Review of Systems  Constitutional: Positive for fatigue. Negative for chills and fever.  HENT:   Positive for mouth sores.   Eyes: Negative.   Respiratory: Positive for cough and shortness of breath.   Cardiovascular: Negative.   Gastrointestinal: Positive for diarrhea. Negative for abdominal pain, blood in stool, constipation, nausea and vomiting.  Endocrine: Negative.    Genitourinary: Negative for dysuria and hematuria.   Musculoskeletal: Negative.   Skin: Negative.  Negative for rash.  Neurological: Negative.   Psychiatric/Behavioral: Negative.      PAST MEDICAL/SURGICAL HISTORY:  Past Medical History:  Diagnosis Date  . Abdominal wall hernia   . Anemia   . Anemia due to antineoplastic chemotherapy 07/05/2016  . Aortic aneurysm (HCC)    small aortic aneurysm  . Arthritis    "little in my right hand" (03/10/2016)  . Cervical cancer ([place]) 1963  . COPD (chronic obstructive pulmonary disease) ([place])   . Emphysema   . GERD (gastroesophageal reflux disease)   . Goals of care, counseling/discussion 02/02/2016  . Hypercholesterolemia   . Hypertension   .  Low serum vitamin B12 01/20/2016  . Pancreatic adenocarcinoma [facility]) July 2010  . Pancreatic cancer metastasized to lung [facility]) 09/04/2015   Past Surgical History:  Procedure Laterality Date  . BREAST CYST EXCISION Right 1988  . BUNIONECTOMY Bilateral 2007  . CARDIAC CATHETERIZATION    . CATARACT EXTRACTION W/ INTRAOCULAR LENS  IMPLANT, BILATERAL Bilateral 2009  . CHOLECYSTECTOMY  July 2010   done w/ Whipple procedure  . IR GENERIC HISTORICAL  03/15/2016   IR GUIDED DRAIN W CATHETER PLACEMENT 03/15/2016 Greggory Keen, MD [facility]-INTERV RAD  . SKIN CANCER EXCISION Left    nasal fold  . STAPLING OF BLEBS Left 04/20/2015   Procedure: STAPLING OF BLEBS;  Surgeon: Ivin Poot, MD;  Location: [place];  Service: Thoracic;  Laterality: Left;  . THYROID SURGERY     "had a growth taken off"  . TONSILLECTOMY  1943  . VAGINAL HYSTERECTOMY  1963   Cervical Cancer  . VIDEO ASSISTED THORACOSCOPY Left 04/20/2015   Procedure: VIDEO ASSISTED THORACOSCOPY;  Surgeon: Ivin Poot, MD;  Location: [place];  Service: Thoracic;  Laterality: Left;  . WHIPPLE PROCEDURE  July 2010   pancreatic adenocarcinoma     SOCIAL HISTORY:  Social History   Social History  . Marital status: Widowed    Spouse name: N/A  .  Number of children: 0  . Years of education: N/A   Occupational History  .  Retired   Social History Main Topics  . Smoking status: Former Smoker    Packs/day: 1.00    Years: 30.00    Types: Cigarettes    Quit date: 02/29/1996  . Smokeless tobacco: Never Used  . Alcohol use No  . Drug use: No  . Sexual activity: No   Other Topics Concern  . Not on file   Social History Narrative  . No narrative on file    FAMILY HISTORY:  Family History  Problem Relation Age of Onset  . Pancreatic cancer Brother   . Colon cancer Sister 101       12/2006 dx surgery and chemotherapy  . Ovarian cancer Sister 47       Ovarian ca,  surgery and chemotherapy    CURRENT MEDICATIONS:  Outpatient Encounter Prescriptions as of 10/18/2016  Medication Sig Note  . acetaminophen (TYLENOL) 325 MG tablet Take 2 tablets (650 mg total) by mouth every 6 (six) hours as needed for mild pain (or Fever >/= 101).   Marland Kitchen amoxicillin (AMOXIL) 500 MG capsule TAKE ONE CAPSULE BY MOUTH THREE TIMES DAILY WITH FOOD   . cholecalciferol (VITAMIN D) 1000 units tablet Take 2,000 Units by mouth daily.   Marland Kitchen CREON 36000 units CPEP capsule TAKE 1 CAPSULE THREE TIMES A DAY WITH MEALS   . diphenoxylate-atropine (LOMOTIL) 2.5-0.025 MG tablet Take 1 tablet by mouth 4 (four) times daily as needed for diarrhea or loose stools.   . feeding supplement, ENSURE ENLIVE, (ENSURE ENLIVE) LIQD Take 237 mLs by mouth 2 (two) times daily between meals.   . hydrochlorothiazide (HYDRODIURIL) 25 MG tablet Take 12.5 mg by mouth every morning.  03/14/2016: 1/2 TAB  . ibuprofen (ADVIL,MOTRIN) 400 MG tablet Take 1 tablet (400 mg total) by mouth every 6 (six) hours as needed for mild pain.   Marland Kitchen lidocaine (XYLOCAINE) 2 % solution Use as directed 20 mLs in the mouth or throat as needed for mouth pain.   Marland Kitchen loperamide (IMODIUM A-D) 2 MG tablet At the onset of diarrhea, take 2 tabs. Then take  1 tab every 2 hours until you have gone 12 hours without having a loose  stool. If it is bedtime and you have a loose stool(s) take 2 tabs every 4 hours until morning. Call [place].   Marland Kitchen losartan (COZAAR) 100 MG tablet Take 100 mg by mouth every morning.    . magic mouthwash w/lidocaine SOLN Take 5 mLs by mouth 4 (four) times daily as needed for mouth pain.   . metoprolol succinate (TOPROL-XL) 50 MG 24 hr tablet Take 25 mg by mouth every morning. Take with or immediately following a meal.  03/14/2016: 1/2 TAB  . omeprazole (PRILOSEC) 20 MG capsule Take 20 mg by mouth daily before breakfast.  03/05/2016: PATIENT CANNOT TAKE PROTONIX (IN-HOUSE)  . ondansetron (ZOFRAN) 8 MG tablet Take 1 tablet (8 mg total) by mouth every 8 (eight) hours as needed for nausea or vomiting.   . prochlorperazine (COMPAZINE) 10 MG tablet Take 1 tablet (10 mg total) by mouth every 6 (six) hours as needed for nausea or vomiting.   Alveda Reasons 20 MG TABS tablet Take 20 mg by mouth daily with supper.     Facility-Administered Encounter Medications as of 10/18/2016  Medication  . [COMPLETED] 0.9 %  sodium chloride infusion  . [COMPLETED] Darbepoetin Alfa (ARANESP) injection 500 mcg  . [COMPLETED] irinotecan LIPOSOME (ONIVYDE) 55.9 mg in sodium chloride 0.9 % 500 mL chemo infusion  . [COMPLETED] leucovorin 652 mg in dextrose 5 % 250 mL infusion  . [COMPLETED] palonosetron (ALOXI) injection 0.25 mg  . [COMPLETED] prochlorperazine (COMPAZINE) tablet 10 mg  . sodium chloride flush (NS) 0.9 % injection 10 mL  . [COMPLETED] fluorouracil (ADRUCIL) 2,450 mg in sodium chloride 0.9 % 101 mL chemo infusion  . [DISCONTINUED] sodium chloride flush (NS) 0.9 % injection 10 mL    ALLERGIES:  Allergies  Allergen Reactions  . Oxaliplatin Anaphylaxis  . Pantoprazole Other (See Comments)    THIS GIVES THE PATIENT TERRIBLE HEARTBURN (PLEASE DO NOT GIVE)  . Adhesive [Tape] Rash  . Aspirin Other (See Comments)    Burns stomach  . Neulasta [Pegfilgrastim] Other (See Comments)    UNSURE   . Oxycodone Nausea  And Vomiting  . Tramadol Nausea And Vomiting     PHYSICAL EXAM:  ECOG Performance status: 1-2 - Symptomatic; remains largely independent, but requires occasional assistance    Physical Exam  Constitutional: She is oriented to person, place, and time.  Seen in chemo chair in infusion area  -Thin frail female in no acute distress   HENT:  Head: Normocephalic.  Eyes: Conjunctivae are normal. No scleral icterus.  Neck: Normal range of motion. Neck supple.  Cardiovascular: Normal rate and regular rhythm.   Pulmonary/Chest: Effort normal and breath sounds normal. No respiratory distress. She has no wheezes. She has no rales.  Abdominal: Soft. Bowel sounds are normal. There is no tenderness.  Musculoskeletal: Normal range of motion. She exhibits no edema.  Lymphadenopathy:    She has no cervical adenopathy.       Right: No supraclavicular adenopathy present.       Left: No supraclavicular adenopathy present.  Neurological: She is alert and oriented to person, place, and time. No cranial nerve deficit.  Skin: Skin is warm and dry. No rash noted.  Psychiatric: Mood, memory, affect and judgment normal.  Nursing note and vitals reviewed.    LABORATORY DATA:  I have reviewed the labs as listed.  CBC    Component Value Date/Time   WBC 7.8 10/18/2016  0957   RBC 2.93 (L) 10/18/2016 0957   HGB 9.0 (L) 10/18/2016 0957   HCT 27.7 (L) 10/18/2016 0957   PLT 192 10/18/2016 0957   MCV 94.5 10/18/2016 0957   MCH 30.7 10/18/2016 0957   MCHC 32.5 10/18/2016 0957   RDW 15.6 (H) 10/18/2016 0957   LYMPHSABS 1.1 10/18/2016 0957   MONOABS 0.6 10/18/2016 0957   EOSABS 0.0 10/18/2016 0957   BASOSABS 0.0 10/18/2016 0957   CMP Latest Ref Rng & Units 10/18/2016 10/04/2016 09/13/2016  Glucose 65 - 99 mg/dL 88 125(H) 124(H)  BUN 6 - 20 mg/dL 17 21(H) 18  Creatinine 0.44 - 1.00 mg/dL 1.17(H) 1.13(H) 1.16(H)  Sodium 135 - 145 mmol/L 138 135 137  Potassium 3.5 - 5.1 mmol/L 3.5 3.9 4.4  Chloride 101 -  111 mmol/L 114(H) 107 110  CO2 22 - 32 mmol/L 15(L) 20(L) 20(L)  Calcium 8.9 - 10.3 mg/dL 8.8(L) 8.9 8.5(L)  Total Protein 6.5 - 8.1 g/dL 6.5 6.6 6.2(L)  Total Bilirubin 0.3 - 1.2 mg/dL 0.6 1.2 1.7(H)  Alkaline Phos 38 - 126 U/L 107 113 123  AST 15 - 41 U/L 16 31 14(L)  ALT 14 - 54 U/L 12(L) 21 9(L)      PENDING LABS:    DIAGNOSTIC IMAGING:  *The following radiologic images and reports have been reviewed independently and agree with below findings.  CT chest/abd/pelvis: 07/26/16       CT head: 07/26/16       PATHOLOGY:  (L) lung wedge resection path: 04/20/15           ASSESSMENT & PLAN:   Stage IV adenocarcinoma of pancreas with lung mets: -Initially diagnosed in 08/2008. Treated with Whipple by Dr. Eugenia Pancoast at [facility]. She received adjuvant chemotherapy with Gemcitabine x 6 months, completing in 08/2009. Found to have metastatic lung disease and underwent wedge resection in 08/2009 for this metastatic disease. Underwent additional left lung resection of lesion felt to be lung primary malignancy with Dr. Arlyce Dice in 05/2010. Underwent SBRT to lung with Dr. Tammi Klippel, which completed on 09/04/11, followed by 5-FU based chemotherapy through 06/13/12. Then was treated with Gemzar/Abraxane for metastatic disease at [facility] in [place] from 02/13/13-05/20/13. Mild progression noted on scans in 06/2014. Gemzar/Abraxane was stopped d/t poor tolerance.  In 03/2015, patient had spontaneous (L) lung pneumothorax. Underwent (L) VATS and mini thoracotomy with path revealing high-grade carcinoma consistent with pancreatic primary. Progression of disease noted on imaging in 09/29/15 and chemotherapy with 5-FU/Leucovorin/Irinotecan started in 09/2015. Again, had spontaneous pneumothorax requiring chest tube placement on 03/05/16 with Dr. Cyndia Bent.  Chemotherapy was held as she recovered and resumed on 04/26/16. Progression of disease noted on CT imaging on 05/09/16,  which was likely secondary to being off of therapy x 2 months with pneumothorax.    -Next restaging CT chest/abd/pelvis due in early 11/2016; orders placed today.   -Last restaging CT chest/abd/pelvis on 07/26/16 reviewed with patient and her sister in detail today. CT head also done on 07/26/16 d/t pt's complaints of headaches, which was negative for malignancy.  Overall, disease is slightly improved and stable. We will continue current chemo regimen. She will be due for repeat restaging imaging sometime in 10/2016; will place orders at subsequent follow-up visit.    -Tumor marker remains variable. Will keep monitoring CA 19-9.  -She understands that her cancer is not curable, but is treatable.  -Due for next cycle 5-FU/leucovorin/Liposomal irinotecan today; labs reviewed and are adequate  for treatment today. Aside from her cold, which she is recovering from, she feels well enough for treatment today. Lungs clear on exam today.  -Continue chemo every 2 weeks.  -Return for follow-up visit a few days after restaging scans in 11/2016 for follow-up and subsequent treatment planning.    Diarrhea:  -Likely multifactorial given chemo and pancreatic insufficiency.  -Will give her prescription for Lomotil today, which she can use in addition to Imodium.  -If Lomotil is not helpful, then we may need to increase her Creon supplementation.   Chemo-induced anemia:  -On Aranesp every 2 weeks. Hgb 9 g/dL today, so Aranesp given today by nursing per protocol.  -Continue Aranesp every 2 weeks as scheduled.          Dispo:  -Continue chemo & Aranesp every 2 weeks as scheduled.  -Restaging scans due in 11/2016; orders placed today.  -Return to cancer center a few days after scans to review results and subsequent treatment planning.    All questions were answered to patient's stated satisfaction. Encouraged patient to call with any new concerns or questions before her next visit to the cancer center and we  can certain see her sooner, if needed.    Plan of care discussed with Dr. Talbert Cage, who agrees with the above aforementioned.     Orders placed this encounter:  Orders Placed This Encounter  Procedures  . CT Chest W Contrast  . CT Abdomen Pelvis W Contrast      Mike Craze, NP [place] [phone]

## 2016-10-18 NOTE — Progress Notes (Signed)
Labs reviewed with Mike Craze NP, will proceed with treatment.  [name] [name] presents today for injection per MD orders. Aranesp 563mcg administered SQ in left Abdomen. Administration without incident. Patient tolerated well.  Infusion given today per orders. Patient tolerated it well without problems. Vitals stable and discharged home from clinic ambulatory. Continuous 5FU pump connected today. Follow up as scheduled.

## 2016-10-19 ENCOUNTER — Encounter (HOSPITAL_COMMUNITY): Payer: Medicare Other

## 2016-10-19 LAB — CANCER ANTIGEN 19-9: CAN 19-9: 988 U/mL — AB (ref 0–35)

## 2016-10-20 ENCOUNTER — Encounter (HOSPITAL_BASED_OUTPATIENT_CLINIC_OR_DEPARTMENT_OTHER): Payer: Medicare Other

## 2016-10-20 ENCOUNTER — Encounter (HOSPITAL_COMMUNITY): Payer: Self-pay

## 2016-10-20 VITALS — BP 117/45 | HR 59 | Temp 98.6°F | Resp 18

## 2016-10-20 DIAGNOSIS — C7800 Secondary malignant neoplasm of unspecified lung: Secondary | ICD-10-CM | POA: Diagnosis not present

## 2016-10-20 DIAGNOSIS — R197 Diarrhea, unspecified: Secondary | ICD-10-CM | POA: Diagnosis not present

## 2016-10-20 DIAGNOSIS — C259 Malignant neoplasm of pancreas, unspecified: Secondary | ICD-10-CM | POA: Diagnosis not present

## 2016-10-20 DIAGNOSIS — Z95828 Presence of other vascular implants and grafts: Secondary | ICD-10-CM | POA: Diagnosis not present

## 2016-10-20 MED ORDER — SODIUM CHLORIDE 0.9% FLUSH
10.0000 mL | INTRAVENOUS | Status: DC | PRN
  Administered 2016-10-20: 10 mL
  Filled 2016-10-20: qty 10

## 2016-10-20 MED ORDER — HEPARIN SOD (PORK) LOCK FLUSH 100 UNIT/ML IV SOLN
500.0000 [IU] | Freq: Once | INTRAVENOUS | Status: AC | PRN
  Administered 2016-10-20: 500 [IU]

## 2016-10-20 MED ORDER — HEPARIN SOD (PORK) LOCK FLUSH 100 UNIT/ML IV SOLN
INTRAVENOUS | Status: AC
  Filled 2016-10-20: qty 5

## 2016-10-20 NOTE — Progress Notes (Signed)
Patient with reported worsening tongue swelling at time of 5-FU pump removal today.  Denies any systemic anaphylactic symptoms including rash or difficulty breathing. Currently in no distress.   Examined her oral cavity with flashlight. There is tongue edema appreciated; no obvious evidence of mucositis at this time.  Posterior oropharynx is mildly erythematous, but not swollen.    Recommended she try OTC Benadryl and call us tomorrow if symptoms are no better.  It is possible that she is having delayed allergic reaction to chemo, although this seems unlikely.  She reported these symptoms back in 07/2016 as well with previous visits, "but I feel like it's a little worse this time."  Instructed her to go to ED if her symptoms worsen or if she has any shortness of breath/difficulty breathing.  She agreed.     Mike Craze, NP [place] [phone]

## 2016-10-20 NOTE — Progress Notes (Signed)
Patient in to have 5FU pump removed.  Flushed per protocol.  Stated she has noticed tongue puffiness in the past with her chemo pump but this time her tongue felt more puffy and has sores in the corners of her mouth.  Also, stated she noticed a difference in her swallowing last night.  Denied SOB or any difficulty in breathing.  No s/s of distress noted.    Reviewed the patients symptoms with Mike Craze, NP, and the patient was seen by the nurse practitoner.  Patient was instructed to take a benadryl this evening to see if her tongue felt less puffy and to continue the magic mouthwash.  The patient verbalized understanding and will call tomorrow to notify of any improvement or worsening.  The patient was instructed to go to the emergency room for any worsening of symptoms with understanding verbalized.    The patient left ambulatory with VSS and no s/s of distress.

## 2016-10-20 NOTE — Patient Instructions (Signed)
[place] at [facility]  Discharge Instructions:  Chemotherapy pump removed today.  Take a benadryl this evening and call tomorrow to notify the staff how you are doing and continue the magic mouthwash.   _______________________________________________________________  Thank you for choosing [place] at [facility] to provide your oncology and hematology care.  To afford each patient quality time with our providers, please arrive at least 15 minutes before your scheduled appointment.  You need to re-schedule your appointment if you arrive 10 or more minutes late.  We strive to give you quality time with our providers, and arriving late affects you and other patients whose appointments are after yours.  Also, if you no show three or more times for appointments you may be dismissed from the clinic.  Again, thank you for choosing [facility] [place] hope is that these requests will allow you access to exceptional care and in a timely manner. _______________________________________________________________  If you have questions after your visit, please contact our office at [phone] between the hours of 8:30 a.m. and 5:00 p.m. Voicemails left after 4:30 p.m. will not be returned until the following business day. _______________________________________________________________  For prescription refill requests, have your pharmacy contact our office. _______________________________________________________________  Recommendations made by the consultant and any test results will be sent to your referring physician. _______________________________________________________________

## 2016-10-21 ENCOUNTER — Telehealth (HOSPITAL_COMMUNITY): Payer: Self-pay

## 2016-10-21 NOTE — Telephone Encounter (Signed)
Thanks so much for the update.  Glad she is feeling better.   gwd

## 2016-10-21 NOTE — Telephone Encounter (Signed)
Patient called wanting to let Elzie Rings, NP, know that her tongue swelling has gone down but her tongue is still a little sore where she "chewed on it". Patient states the swelling went down around 6-7 pm last night. She states she did take the Benadryl before going to bed.

## 2016-10-23 IMAGING — CR DG CHEST 2V
2 series · 2 of 2 positions shown · non-contrast
Comparison: 04/27/2015.

CLINICAL DATA: Left chest tube.

EXAM:
CHEST  2 VIEW

[chest pa]
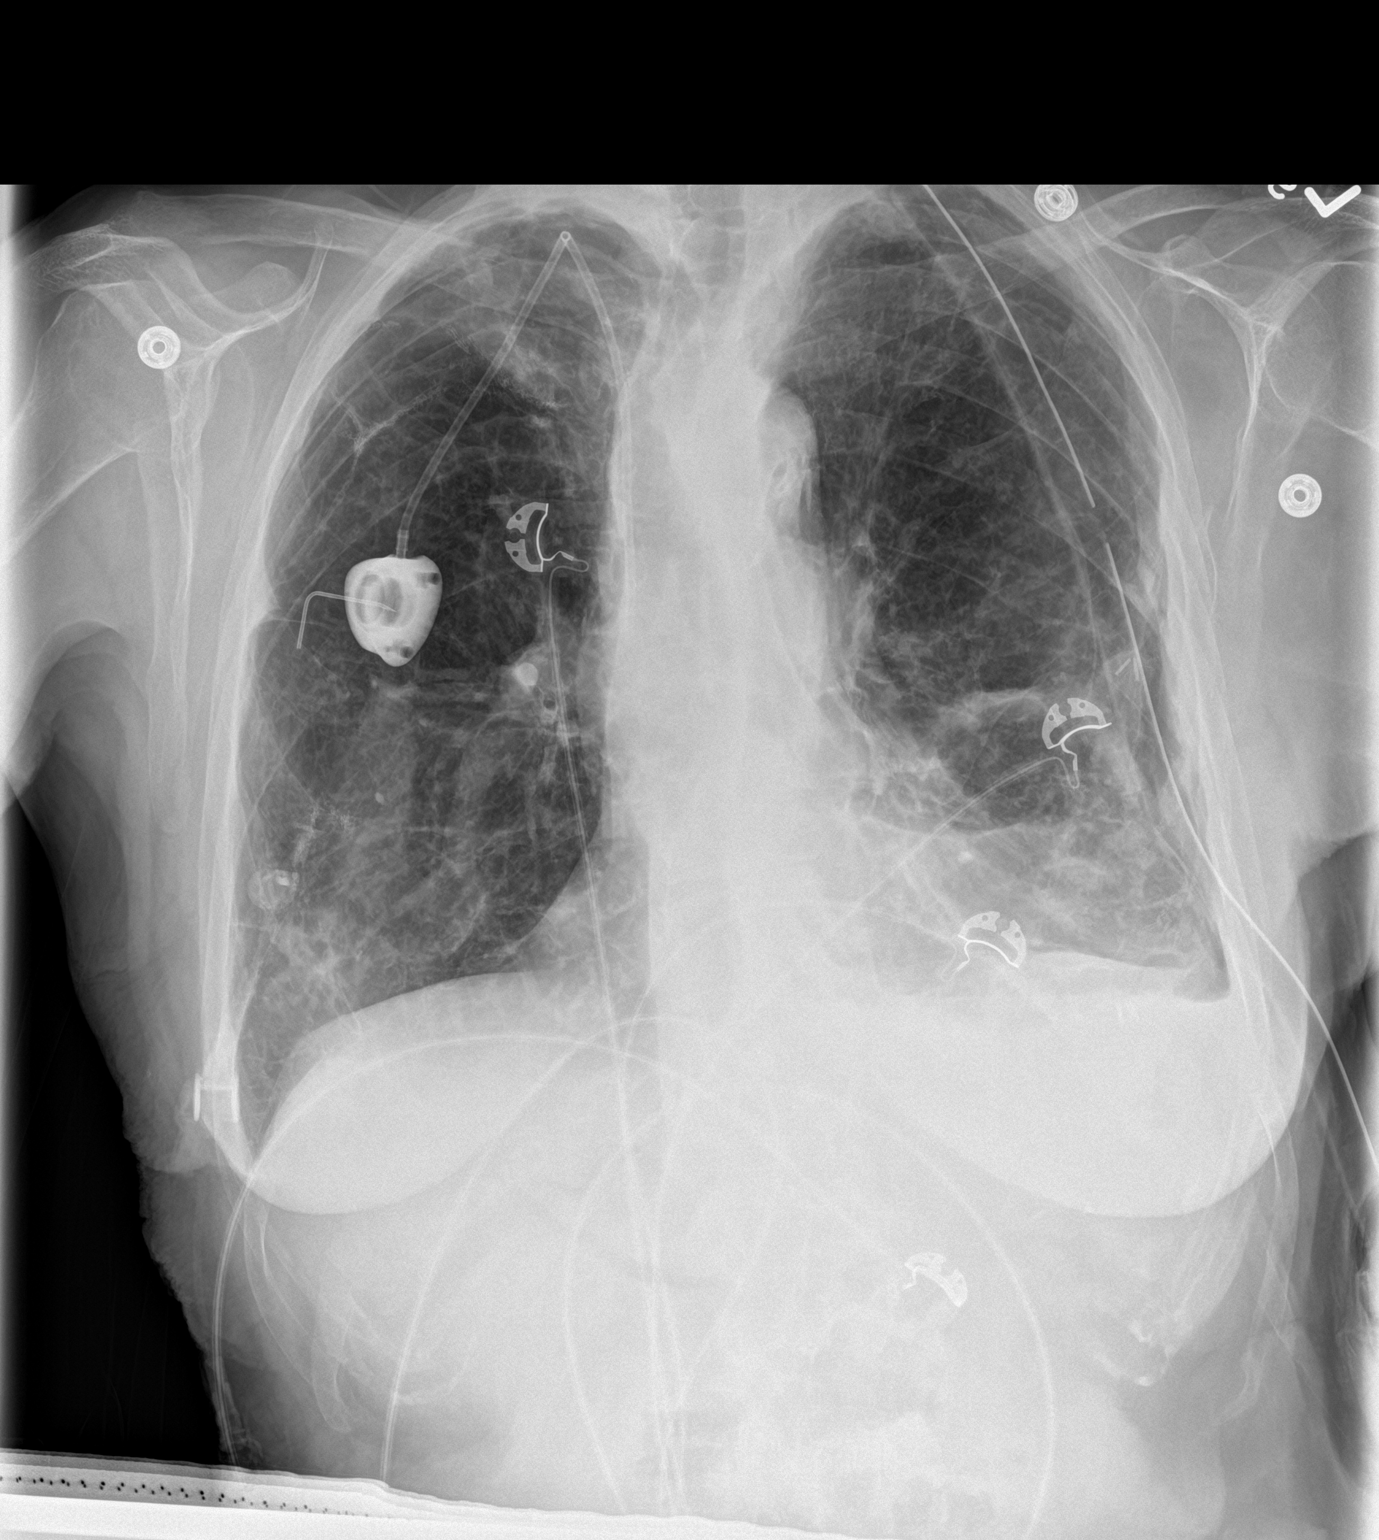

[chest lat]
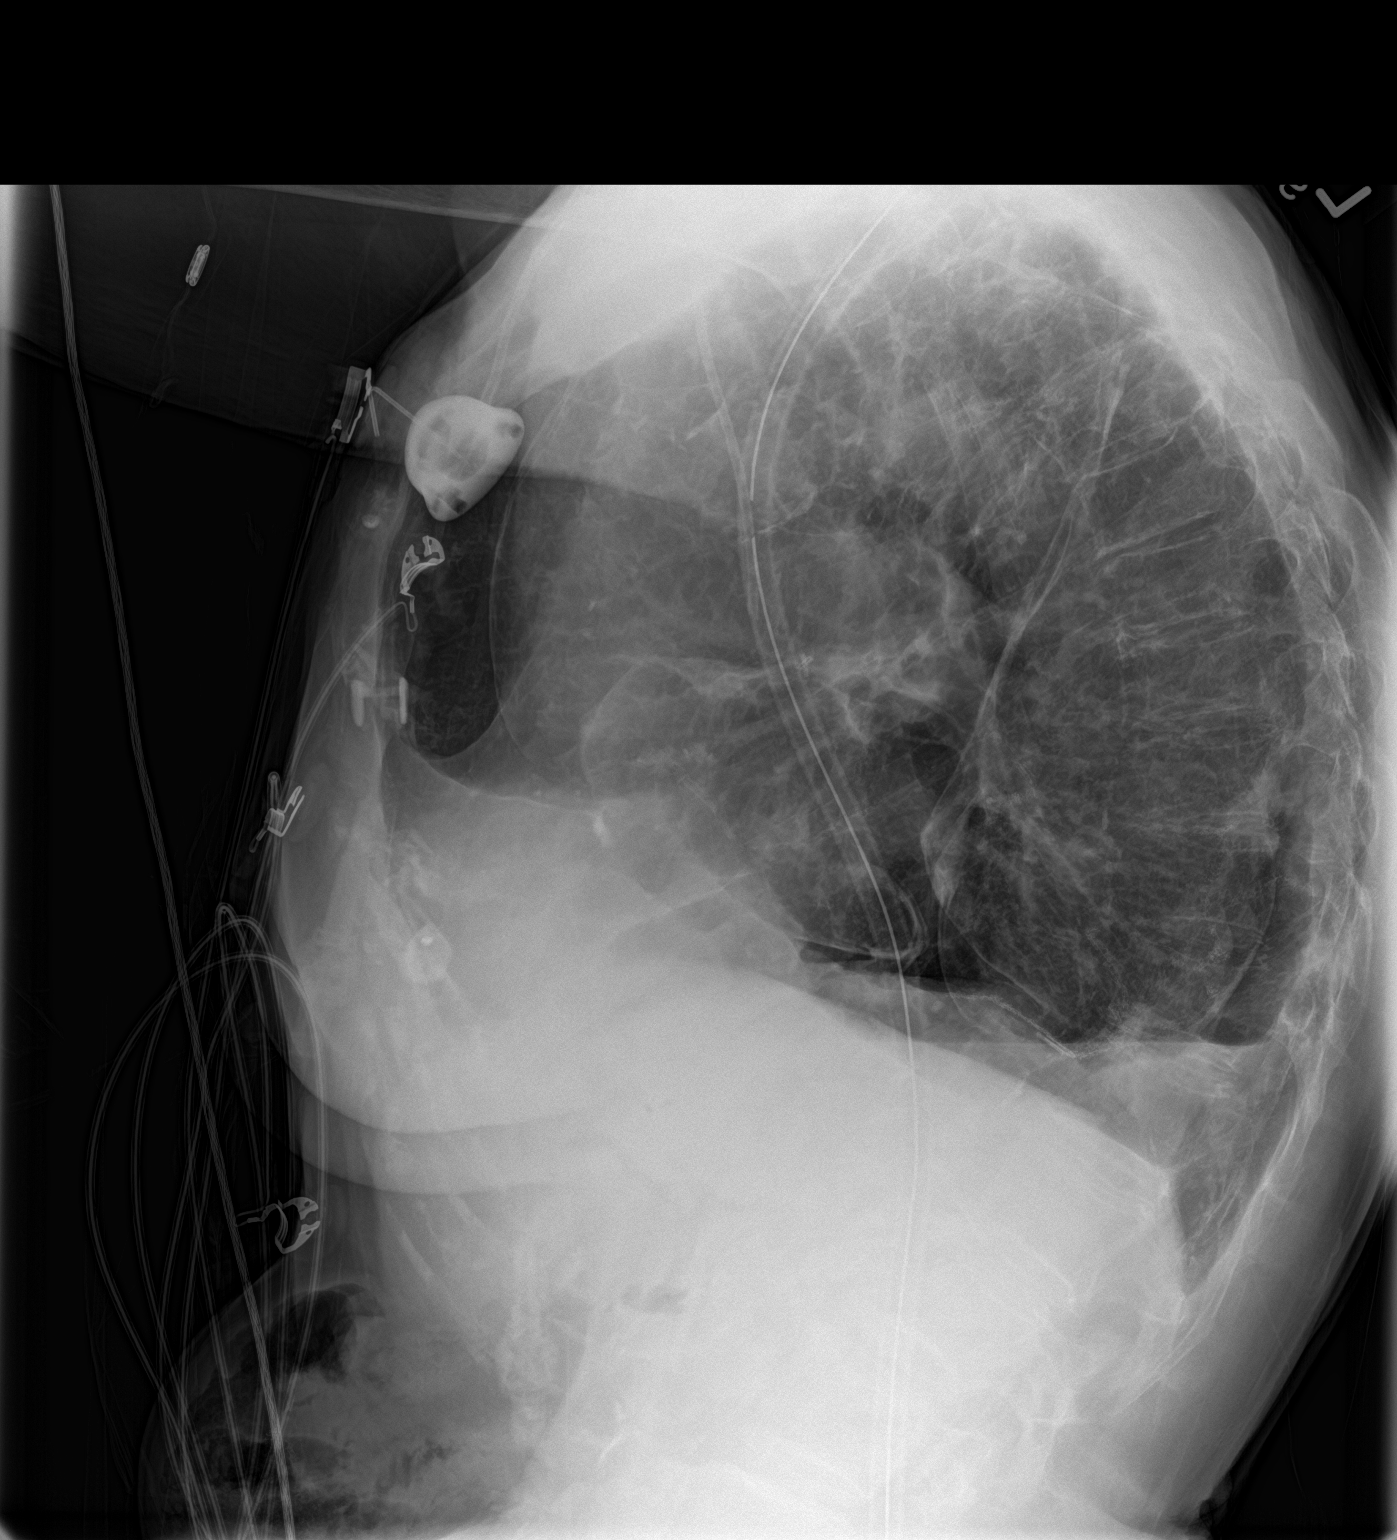

[2 of 2 positions shown; findings below may reference images not displayed]

FINDINGS: Left chest tube in stable position. Persistent but improving small
left-sided pneumothorax. PowerPort catheter in stable position.
Surgical sutures right lung. Low lung volumes with mild bibasilar
atelectasis .
IMPRESSION: 1. Left chest tube in stable position. Improving small left-sided
pneumothorax.

2. Low lung volumes with mild bibasilar atelectasis.

## 2016-11-02 ENCOUNTER — Encounter (HOSPITAL_COMMUNITY): Payer: Self-pay

## 2016-11-02 ENCOUNTER — Encounter (HOSPITAL_COMMUNITY): Payer: Self-pay | Admitting: Adult Health

## 2016-11-02 ENCOUNTER — Other Ambulatory Visit (HOSPITAL_COMMUNITY): Payer: Self-pay | Admitting: Oncology

## 2016-11-02 ENCOUNTER — Encounter (HOSPITAL_COMMUNITY): Payer: Medicare Other

## 2016-11-02 ENCOUNTER — Encounter (HOSPITAL_COMMUNITY): Payer: Medicare Other | Attending: Adult Health

## 2016-11-02 ENCOUNTER — Other Ambulatory Visit (HOSPITAL_COMMUNITY): Payer: Self-pay | Admitting: Adult Health

## 2016-11-02 VITALS — BP 106/47 | HR 55 | Temp 98.0°F | Resp 18 | Wt 113.4 lb

## 2016-11-02 DIAGNOSIS — R197 Diarrhea, unspecified: Secondary | ICD-10-CM | POA: Insufficient documentation

## 2016-11-02 DIAGNOSIS — Z5111 Encounter for antineoplastic chemotherapy: Secondary | ICD-10-CM

## 2016-11-02 DIAGNOSIS — Z95828 Presence of other vascular implants and grafts: Secondary | ICD-10-CM | POA: Insufficient documentation

## 2016-11-02 DIAGNOSIS — C7800 Secondary malignant neoplasm of unspecified lung: Secondary | ICD-10-CM | POA: Insufficient documentation

## 2016-11-02 DIAGNOSIS — C7802 Secondary malignant neoplasm of left lung: Secondary | ICD-10-CM

## 2016-11-02 DIAGNOSIS — C259 Malignant neoplasm of pancreas, unspecified: Secondary | ICD-10-CM

## 2016-11-02 LAB — COMPREHENSIVE METABOLIC PANEL
ALK PHOS: 111 U/L (ref 38–126)
ALT: 9 U/L — AB (ref 14–54)
ANION GAP: 9 (ref 5–15)
AST: 15 U/L (ref 15–41)
Albumin: 3.3 g/dL — ABNORMAL LOW (ref 3.5–5.0)
BILIRUBIN TOTAL: 0.7 mg/dL (ref 0.3–1.2)
BUN: 13 mg/dL (ref 6–20)
CALCIUM: 9.1 mg/dL (ref 8.9–10.3)
CO2: 20 mmol/L — AB (ref 22–32)
CREATININE: 1.15 mg/dL — AB (ref 0.44–1.00)
Chloride: 109 mmol/L (ref 101–111)
GFR calc Af Amer: 51 mL/min — ABNORMAL LOW (ref >60)
GFR calc non Af Amer: 44 mL/min — ABNORMAL LOW (ref >60)
GLUCOSE: 102 mg/dL — AB (ref 65–99)
Potassium: 4.2 mmol/L (ref 3.5–5.1)
SODIUM: 138 mmol/L (ref 135–145)
TOTAL PROTEIN: 6.4 g/dL — AB (ref 6.5–8.1)

## 2016-11-02 LAB — CBC WITH DIFFERENTIAL/PLATELET
BASOS ABS: 0 10*3/uL (ref 0.0–0.1)
Basophils Relative: 0 %
Eosinophils Absolute: 0.1 10*3/uL (ref 0.0–0.7)
Eosinophils Relative: 2 %
HEMATOCRIT: 33.7 % — AB (ref 36.0–46.0)
Hemoglobin: 10.5 g/dL — ABNORMAL LOW (ref 12.0–15.0)
LYMPHS PCT: 18 %
Lymphs Abs: 1.2 10*3/uL (ref 0.7–4.0)
MCH: 30.8 pg (ref 26.0–34.0)
MCHC: 31.2 g/dL (ref 30.0–36.0)
MCV: 98.8 fL (ref 78.0–100.0)
MONO ABS: 0.7 10*3/uL (ref 0.1–1.0)
Monocytes Relative: 11 %
NEUTROS ABS: 4.5 10*3/uL (ref 1.7–7.7)
Neutrophils Relative %: 69 %
Platelets: 143 10*3/uL — ABNORMAL LOW (ref 150–400)
RBC: 3.41 MIL/uL — AB (ref 3.87–5.11)
RDW: 18.4 % — ABNORMAL HIGH (ref 11.5–15.5)
WBC: 6.5 10*3/uL (ref 4.0–10.5)

## 2016-11-02 MED ORDER — XARELTO 20 MG PO TABS: 20.0000 mg | ORAL_TABLET | Freq: Every day | ORAL | 2 refills | Status: DC

## 2016-11-02 MED ORDER — SODIUM CHLORIDE 0.9% FLUSH
10.0000 mL | INTRAVENOUS | Status: DC | PRN
  Administered 2016-11-02: 10 mL
  Filled 2016-11-02: qty 10

## 2016-11-02 MED ORDER — SODIUM CHLORIDE 0.9 % IV SOLN
Freq: Once | INTRAVENOUS | Status: AC
  Administered 2016-11-02: 500 mL via INTRAVENOUS

## 2016-11-02 MED ORDER — PALONOSETRON HCL INJECTION 0.25 MG/5ML
0.2500 mg | Freq: Once | INTRAVENOUS | Status: AC
  Administered 2016-11-02: 0.25 mg via INTRAVENOUS
  Filled 2016-11-02: qty 5

## 2016-11-02 MED ORDER — SODIUM CHLORIDE 0.9 % IV SOLN
35.0000 mg/m2 | Freq: Once | INTRAVENOUS | Status: AC
  Administered 2016-11-02: 55.9 mg via INTRAVENOUS
  Filled 2016-11-02: qty 13

## 2016-11-02 MED ORDER — HEPARIN SOD (PORK) LOCK FLUSH 100 UNIT/ML IV SOLN
500.0000 [IU] | Freq: Once | INTRAVENOUS | Status: AC | PRN
Start: 2016-11-02 — End: 2016-11-02
  Administered 2016-11-02: 500 [IU]
  Filled 2016-11-02: qty 5

## 2016-11-02 MED ORDER — PROCHLORPERAZINE MALEATE 10 MG PO TABS
10.0000 mg | ORAL_TABLET | Freq: Once | ORAL | Status: AC
  Administered 2016-11-02: 10 mg via ORAL
  Filled 2016-11-02: qty 1

## 2016-11-02 NOTE — Progress Notes (Signed)
Patient stated she needs refill on Xarelto. Reviewed with NP, refill sent to pharmacy

## 2016-11-02 NOTE — Patient Instructions (Signed)
[facility] Discharge Instructions for Patients Receiving Chemotherapy  Today you received the following chemotherapy agents Irinotecan.  Call for any questions or concerns.   To help prevent nausea and vomiting after your treatment, we encourage you to take your nausea medication.    If you develop nausea and vomiting that is not controlled by your nausea medication, call the clinic.   BELOW ARE SYMPTOMS THAT SHOULD BE REPORTED IMMEDIATELY:  *FEVER GREATER THAN 100.5 F  *CHILLS WITH OR WITHOUT FEVER  NAUSEA AND VOMITING THAT IS NOT CONTROLLED WITH YOUR NAUSEA MEDICATION  *UNUSUAL SHORTNESS OF BREATH  *UNUSUAL BRUISING OR BLEEDING  TENDERNESS IN MOUTH AND THROAT WITH OR WITHOUT PRESENCE OF ULCERS  *URINARY PROBLEMS  *BOWEL PROBLEMS  UNUSUAL RASH Items with * indicate a potential emergency and should be followed up as soon as possible.  Feel free to call the clinic you have any questions or concerns. The clinic phone number is [phone].  Please show the [facility] at check-in to the Emergency Department and triage nurse.

## 2016-11-02 NOTE — Progress Notes (Signed)
To treatment area for chemotherapy.  Patient spoke with Mike Craze, NP, in the hallway regarding history of tongue swelling and concerns of taking the pump home.  After review with Dr. Talbert Cage patient will not be taking home the 5FU pump today with orders for CT scan before next appointment.  Patient verbalized understanding.   Labs reviewed and ok to treat today.  Hgb 10.5 and Aranesp held today.   Patient tolerated chemotherapy with no complaints voiced.  Site clean and dry with no bruising or swelling noted at site.  Band aid applied.  VSS with discharge and left ambulatory with sister.

## 2016-11-02 NOTE — Progress Notes (Signed)
Briefly saw Ms. [name] [name] today to get an update on her tongue/oral swelling issues. She states that her oral edema has resolved, but she is concerned she is developing an allergic reaction to the 5-FU. Continues to deny any shortness of breath or difficulty breathing, throat swelling, chest pain, or rash.   Discussed with Dr. Talbert Cage.  We will stop all 5-FU from her treatments beginning today.  We will try to obtain restaging CT chest/abd/pelvis sometime before her next follow-up visit to re-assess her disease.  Dr. Talbert Cage will see her and decide any further subsequent treatment planning at that time.    Patient and nursing provided update. Orders for CT chest/abd/pelvis placed today.    Mike Craze, NP [place] [phone]

## 2016-11-02 NOTE — Progress Notes (Signed)
Briefly saw Ms. Gilder today to get an update on her tongue/oral swelling issues. She states that her oral edema has resolved, but she is concerned she is developing an allergic reaction to the 5-FU. Continues to deny any shortness of breath or difficulty breathing, throat swelling, chest pain, or rash.   Discussed with Dr. Talbert Cage.  We will stop all 5-FU from her treatments beginning today.  We will try to obtain restaging CT chest/abd/pelvis sometime before her next follow-up visit to re-assess her disease.  Dr. Talbert Cage will see her and decide any further subsequent treatment planning at that time.    Patient and nursing provided update. Orders for CT chest/abd/pelvis placed today.    Mike Craze, NP [place] [phone]

## 2016-11-03 ENCOUNTER — Other Ambulatory Visit (HOSPITAL_COMMUNITY): Payer: Self-pay

## 2016-11-03 DIAGNOSIS — C7800 Secondary malignant neoplasm of unspecified lung: Principal | ICD-10-CM

## 2016-11-03 DIAGNOSIS — C259 Malignant neoplasm of pancreas, unspecified: Secondary | ICD-10-CM

## 2016-11-03 MED ORDER — XARELTO 20 MG PO TABS: 20.0000 mg | ORAL_TABLET | Freq: Every day | ORAL | 1 refills | Status: DC

## 2016-11-03 NOTE — Telephone Encounter (Signed)
Patient needed 90-day supply of Xarelto, not 30 day supply. New prescription sent to patients pharmacy.

## 2016-11-04 ENCOUNTER — Encounter (HOSPITAL_COMMUNITY): Payer: Medicare Other

## 2016-11-10 ENCOUNTER — Ambulatory Visit (HOSPITAL_COMMUNITY)
Admission: RE | Admit: 2016-11-10 | Discharge: 2016-11-10 | Disposition: A | Discharge status: Home / Self Care | Payer: Medicare Other | Source: Ambulatory Visit | Attending: Adult Health | Admitting: Adult Health

## 2016-11-10 DIAGNOSIS — I700 Atherosclerosis of aorta: Secondary | ICD-10-CM | POA: Insufficient documentation

## 2016-11-10 DIAGNOSIS — J439 Emphysema, unspecified: Secondary | ICD-10-CM | POA: Diagnosis not present

## 2016-11-10 DIAGNOSIS — C7951 Secondary malignant neoplasm of bone: Secondary | ICD-10-CM | POA: Insufficient documentation

## 2016-11-10 DIAGNOSIS — C7800 Secondary malignant neoplasm of unspecified lung: Secondary | ICD-10-CM | POA: Diagnosis not present

## 2016-11-10 DIAGNOSIS — I2510 Atherosclerotic heart disease of native coronary artery without angina pectoris: Secondary | ICD-10-CM | POA: Diagnosis not present

## 2016-11-10 DIAGNOSIS — C259 Malignant neoplasm of pancreas, unspecified: Secondary | ICD-10-CM | POA: Insufficient documentation

## 2016-11-10 DIAGNOSIS — Z90411 Acquired partial absence of pancreas: Secondary | ICD-10-CM | POA: Diagnosis not present

## 2016-11-10 DIAGNOSIS — K439 Ventral hernia without obstruction or gangrene: Secondary | ICD-10-CM | POA: Insufficient documentation

## 2016-11-10 MED ORDER — IOPAMIDOL (ISOVUE-300) INJECTION 61%
80.0000 mL | Freq: Once | INTRAVENOUS | Status: AC | PRN
  Administered 2016-11-10: 80 mL via INTRAVENOUS

## 2016-11-15 ENCOUNTER — Other Ambulatory Visit: Payer: Self-pay | Admitting: Cardiothoracic Surgery

## 2016-11-15 ENCOUNTER — Encounter (HOSPITAL_BASED_OUTPATIENT_CLINIC_OR_DEPARTMENT_OTHER): Payer: Medicare Other | Admitting: Oncology

## 2016-11-15 ENCOUNTER — Telehealth (HOSPITAL_COMMUNITY): Payer: Self-pay | Admitting: Oncology

## 2016-11-15 ENCOUNTER — Encounter (HOSPITAL_COMMUNITY): Payer: Self-pay | Admitting: Oncology

## 2016-11-15 ENCOUNTER — Encounter (HOSPITAL_BASED_OUTPATIENT_CLINIC_OR_DEPARTMENT_OTHER): Payer: Medicare Other

## 2016-11-15 ENCOUNTER — Encounter (HOSPITAL_COMMUNITY): Payer: Medicare Other

## 2016-11-15 ENCOUNTER — Encounter (HOSPITAL_COMMUNITY): Payer: Medicare Other | Admitting: Dietician

## 2016-11-15 VITALS — BP 138/63 | HR 103 | Temp 97.7°F | Resp 18 | Wt 112.4 lb

## 2016-11-15 DIAGNOSIS — C259 Malignant neoplasm of pancreas, unspecified: Secondary | ICD-10-CM | POA: Diagnosis not present

## 2016-11-15 DIAGNOSIS — C7800 Secondary malignant neoplasm of unspecified lung: Principal | ICD-10-CM

## 2016-11-15 DIAGNOSIS — Z95828 Presence of other vascular implants and grafts: Secondary | ICD-10-CM | POA: Diagnosis not present

## 2016-11-15 DIAGNOSIS — J9383 Other pneumothorax: Secondary | ICD-10-CM

## 2016-11-15 DIAGNOSIS — R197 Diarrhea, unspecified: Secondary | ICD-10-CM | POA: Diagnosis not present

## 2016-11-15 DIAGNOSIS — C7802 Secondary malignant neoplasm of left lung: Secondary | ICD-10-CM | POA: Diagnosis not present

## 2016-11-15 DIAGNOSIS — D6481 Anemia due to antineoplastic chemotherapy: Secondary | ICD-10-CM

## 2016-11-15 LAB — CBC WITH DIFFERENTIAL/PLATELET
BASOS ABS: 0 10*3/uL (ref 0.0–0.1)
BASOS PCT: 0 %
EOS ABS: 0.1 10*3/uL (ref 0.0–0.7)
Eosinophils Relative: 1 %
HEMATOCRIT: 30.8 % — AB (ref 36.0–46.0)
HEMOGLOBIN: 9.8 g/dL — AB (ref 12.0–15.0)
Lymphocytes Relative: 18 %
Lymphs Abs: 1.1 10*3/uL (ref 0.7–4.0)
MCH: 31.1 pg (ref 26.0–34.0)
MCHC: 31.8 g/dL (ref 30.0–36.0)
MCV: 97.8 fL (ref 78.0–100.0)
Monocytes Absolute: 0.6 10*3/uL (ref 0.1–1.0)
Monocytes Relative: 10 %
NEUTROS ABS: 4.5 10*3/uL (ref 1.7–7.7)
NEUTROS PCT: 71 %
Platelets: 195 10*3/uL (ref 150–400)
RBC: 3.15 MIL/uL — ABNORMAL LOW (ref 3.87–5.11)
RDW: 16.8 % — AB (ref 11.5–15.5)
WBC: 6.3 10*3/uL (ref 4.0–10.5)

## 2016-11-15 MED ORDER — HEPARIN SOD (PORK) LOCK FLUSH 100 UNIT/ML IV SOLN
500.0000 [IU] | Freq: Once | INTRAVENOUS | Status: AC
  Administered 2016-11-15: 500 [IU] via INTRAVENOUS

## 2016-11-15 MED ORDER — DARBEPOETIN ALFA 500 MCG/ML IJ SOSY
500.0000 ug | PREFILLED_SYRINGE | Freq: Once | INTRAMUSCULAR | Status: AC
  Administered 2016-11-15: 500 ug via SUBCUTANEOUS

## 2016-11-15 MED ORDER — SODIUM CHLORIDE 0.9% FLUSH
10.0000 mL | Freq: Once | INTRAVENOUS | Status: AC
  Administered 2016-11-15: 10 mL via INTRAVENOUS

## 2016-11-15 MED ORDER — ERLOTINIB HCL 100 MG PO TABS: 100.0000 mg | ORAL_TABLET | Freq: Every day | ORAL | 3 refills | Status: DC

## 2016-11-15 MED ORDER — HEPARIN SOD (PORK) LOCK FLUSH 100 UNIT/ML IV SOLN
INTRAVENOUS | Status: AC
Start: 2016-11-15 — End: ?
  Filled 2016-11-15: qty 5

## 2016-11-15 MED ORDER — DARBEPOETIN ALFA 500 MCG/ML IJ SOSY
PREFILLED_SYRINGE | INTRAMUSCULAR | Status: AC
  Filled 2016-11-15: qty 1

## 2016-11-15 NOTE — Addendum Note (Signed)
Addended by: Joanne Gavel T on: 11/15/2016 02:11 PM   Modules accepted: Orders

## 2016-11-15 NOTE — Progress Notes (Signed)
Patient received aranesp with no complaints voiced.  Injection site clean and dry with no bruising or swelling noted at site.  Port flushed per protocol with lab work drawn.  Site clean and dry with no bruising or swelling noted at site.  Band aids applied to both sites.  VSS with discharge and left ambulatory with sister.

## 2016-11-15 NOTE — Patient Instructions (Addendum)
[name] [name]   CHEMOTHERAPY INSTRUCTIONS  You have Stage 4 Pancreatic Caner.  We are going to treat you with Tarceva and Gemzar. You will take tarceva daily on a empty stomach.  Gemzar will be given IV on days 1, day 8 and day 15 every 28 days.  This means 3 weeks in a row with a 1 week break.  This treatment is with palliative intent, which means you are treatable but not curable.  You will see the doctor regularly throughout treatment.  We monitor your lab work prior to every treatment.  The doctor monitors your response to treatment by the way you are feeling, your blood work, and scans periodically.  There will be wait times while you are here for treatment.  It will take about 30 minutes to 1 hour for your lab work to result.  Then there will be wait times while pharmacy mixes your medications.     POTENTIAL SIDE EFFECTS OF TREATMENT:  Erlotinib (Tarceva)  About This Drug Erlotinib is used to treat cancer. It is given orally (by mouth).  Possible Side Effects . Loose bowel movements (diarrhea) . Nausea and throwing up (vomiting) . Decreased appetite (decreased hunger) . Tiredness . Cough . Trouble breathing . Rash . Changes in your liver function Note: Each of the side effects above was reported in 20% or greater of patients treated with erlotinib. Not all possible side effects are included above.  Warnings and Precautions . Inflammation (swelling) of the lungs, which can be life-threatening. You may have a dry cough or trouble breathing. . Changes in your kidney function which can cause kidney failure and be life-threatening . Severe changes in your liver function which can cause liver failure and be life-threatening . Perforation- a hole in your stomach, small and/or large intestine which can be life-threatening . Severe skin reactions. You may get a rash with fluid-filled bumps/blisters and/or a red skin rash which sometimes can  be weeping (peeling off). [name] [name] of your red blood cells which is very rare and can cause anemia (decreased red blood cells) . Increased risk of a stroke in patients with pancreatic cancer. Symptoms of a stroke such as sudden numbness or weakness of your face, arm, or leg, especially on one side of your body; sudden confusion, trouble speaking or understanding; sudden trouble seeing in one or both eyes; sudden trouble walking, feeling dizzy, loss of balance or coordination; or sudden bad headache with no known cause. If you have any of these symptoms for 2 minutes, call 911. . Eye irritation, increased tears, sensitivity to light and other changes in eyesight. . Erlotinib may interact with blood thinning medicine such as warfarin, which may increase your risk of bleeding. symptoms may be coughing up blood, throwing up blood (may look like coffee grounds), red or black tarry bowel movements, abnormally heavy menstrual flow, nosebleeds or any other unusual bleeding. Note: Some of the side effects above are very rare. If you have concerns and/or questions, please discuss them with your medical team.  Important Information . Talk to your doctor and/or nurse if you smoke or have any changes in your smoking habit while taking erlotinib as this may lower the levels of the drug in your body, which can make it less effective. It is highly recommended to quit smoking while taking erlotinib.  How to Take Your Medication . Take this drug by mouth without food, at least 1 hour before you eat or 2 hours  after you eat. . If you take an antacid such as Pepcid, Tagamet, and Zantac, take erlotinib at least 2 hours before or 10 hours after you take the antacid. Antacids such as Maalox and Mylanta should be taken before or after erlotinib by several hours. Contact your physician for specific instructions. . Missed dose: If you vomit or miss a dose, contact your physician for further instructions.  Do not take 2 doses at the same time and do not double up on the next dose. Marland Kitchen Handling: Wash your hands after handling your medicine, your caretakers should not handle your medicine with bare hands and should wear latex gloves. . This drug may be present in the saliva, tears, sweat, urine, stool, vomit, semen, and vaginal secretions. Talk to your doctor and/or your nurse about the necessary precautions to take during this time. . Storage: Store this medicine in the original container at room temperature. Discuss with your nurse or your doctor how to dispose of unused medicine.  Treating Side Effects . Manage tiredness by pacing your activities for the day. Be sure to include periods of rest between energy-draining activities. . Drink plenty of fluids (a minimum of eight glasses per day is recommended). . If you throw up or have loose bowel movements, you should drink more fluids so that you do not become dehydrated (lack water in the body from losing too much fluid). . If you get diarrhea, eat low-fiber foods that are high in protein and calories and avoid foods that can irritate your digestive tracts or lead to cramping. . Ask your nurse or doctor about medicine that can lessen or stop your diarrhea. . To help with nausea and vomiting, eat small, frequent meals instead of three large meals a day. Choose foods and drinks that are at room temperature. Ask your nurse or doctor about other helpful tips and medicine that is available to help or stop lessen these symptoms. . To help with decreased appetite, eat small, frequent meals. . Eat high caloric food such as pudding, ice cream, yogurt and milkshakes. . If you get a rash do not put anything on it unless your doctor or nurse says you may. Keep the area around the rash clean and dry. Ask your doctor for medicine if your rash bothers you. . Use sunscreen with SPF 30 or higher when you are outdoors even for a short time. Cover up when you  are out in the sun. Wear wide-brimmed hats, long-sleeved shirts, and pants. Keep your neck, chest, and back covered.  Food and Drug Interactions . Do not eat grapefruit or drink grapefruit juice while taking this medicine. Grapefruit and grapefruit juice may raise the levels of erlotinib in your body. This could make side effects worse. . Check with your doctor or pharmacist about all other prescription medicines and dietary supplements you are taking before starting this medicine as there are known drug interactions with erlotinib. Also, check with your doctor or pharmacist before starting any new prescription or overthe- counter medicines, or dietary supplement to make sure that there are no interactions. . Avoid the use of St. John's Wort and cigarette smoking while taking erlotinib as this may lower the levels of the drug in your body, which can make it less effective . Drugs that treat heartburn and stomach upset such Maalox, Mylanta, Protonix, Nexium, Prilosec, Pepcid, Tagamet, and Zantac may lower the effect of your cancer treatment if taken with erlotinib. Call your doctor to find out what drug you may  take with erlotinib to help with heartburn or stomach upset. . There are known interactions of erlotinib with blood thinning medicine such as warfarin. Ask your doctor what precautions you should take.  When to Call the Doctor Call your doctor or nurse if you have any of these symptoms and/or any new or unusual symptoms: . Fever of 100.5 F (38 C) or higher . Chills . Blurred vision or other changes in eyesight . Red or painful eye . Sensitivity to light . Pain in your chest . Dry cough or coughing up yellow, green, or bloody mucus . Wheezing or trouble breathing . Nausea that stops you from eating or drinking and/or is not relieved by prescribed medicines . Throwing up more than 3 times a day . Loose bowel movements (diarrhea) 4 times a day or loose bowel movements with  lack of strength or a feeling of being dizzy . Lasting loss of appetite or rapid weight loss of five pounds in a week . Fatigue that interferes with your daily activities . Severe Abdominal pain that does not go away . Easy bleeding or bruising . Blood in your urine, vomit (bright red or coffee-ground) and/or stools ( bright red, or black/tarry) . Coughing up blood . Decreased urine, or very dark urine . Signs of possible liver problems: dark urine, pale bowel movements, bad stomach pain, feeling very tired and weak, unusual itching, or yellowing of the eyes or skin . New rash and/or itching . Rash that is not relieved by prescribed medicines . Symptoms of a stroke such as sudden numbness or weakness of your face, arm, or leg, especially on one side of your body; sudden confusion, trouble speaking or understanding; sudden trouble seeing in one or both eyes; sudden trouble walking, feeling dizzy, loss of balance or coordination; or sudden bad headache with no known cause. If you have any of these symptoms for 2 minutes, call 911. . If you think you may be pregnant  Reproduction Warnings . Pregnancy warning: This drug can have harmful effects on the unborn baby. Women of childbearing potential should use effective methods of birth control during your cancer treatment and for 1 month after treatment. Let your doctor know right away if you think you may be pregnant. . Breastfeeding warning: Women should not breastfeed during treatment and for 2 weeks after treatment because this drug could enter the breast milk and cause harm to a breastfeeding baby. . Fertility warning: Human fertility studies have not been done with this drug. Talk with your doctor or nurse if you plan to have children. Ask for information on sperm or egg banking.    SELF CARE ACTIVITIES WHILE ON CHEMOTHERAPY: Hydration Increase your fluid intake 48 hours prior to treatment and drink at least 8 to 12 cups (64  ounces) of water/decaff beverages per day after treatment. You can still have your cup of coffee or soda but these beverages do not count as part of your 8 to 12 cups that you need to drink daily. No alcohol intake.  Medications Continue taking your normal prescription medication as prescribed.  If you start any new herbal or new supplements please let us know first to make sure it is safe.  Mouth Care Have teeth cleaned professionally before starting treatment. Keep dentures and partial plates clean. Use soft toothbrush and do not use mouthwashes that contain alcohol. Biotene is a good mouthwash that is available at most pharmacies or may be ordered by calling [phone]. Use warm salt  water gargles (1 teaspoon salt per 1 quart warm water) before and after meals and at bedtime. Or you may rinse with 2 tablespoons of three-percent hydrogen peroxide mixed in eight ounces of water. If you are still having problems with your mouth or sores in your mouth please call the clinic. If you need dental work, please let the doctor know before you go for your appointment so that we can coordinate the best possible time for you in regards to your chemo regimen. You need to also let your dentist know that you are actively taking chemo. We may need to do labs prior to your dental appointment.   Skin Care Always use sunscreen that has not expired and with SPF (Sun Protection Factor) of 50 or higher. Wear hats to protect your head from the sun. Remember to use sunscreen on your hands, ears, face, & feet.  Use good moisturizing lotions such as udder cream, eucerin, or even Vaseline. Some chemotherapies can cause dry skin, color changes in your skin and nails.     Avoid long, hot showers or baths.  Use gentle, fragrance-free soaps and laundry detergent.  Use moisturizers, preferably creams or ointments rather than lotions because the thicker consistency is better at preventing skin dehydration. Apply the  cream or ointment within 15 minutes of showering. Reapply moisturizer at night, and moisturize your hands every time after you wash them.  Hair Loss (if your doctor says your hair will fall out)   If your doctor says that your hair is likely to fall out, decide before you begin chemo whether you want to wear a wig. You may want to shop before treatment to match your hair color.  Hats, turbans, and scarves can also camouflage hair loss, although some people prefer to leave their heads uncovered. If you go bare-headed outdoors, be sure to use sunscreen on your scalp.  Cut your hair short. It eases the inconvenience of shedding lots of hair, but it also can reduce the emotional impact of watching your hair fall out.  Don't perm or color your hair during chemotherapy. Those chemical treatments are already damaging to hair and can enhance hair loss. Once your chemo treatments are done and your hair has grown back, it's OK to resume dyeing or perming hair. With chemotherapy, hair loss is almost always temporary. But when it grows back, it may be a different color or texture. In older adults who still had hair color before chemotherapy, the new growth may be completely gray.  Often, new hair is very fine and soft.  Infection Prevention Please wash your hands for at least 30 seconds using warm soapy water. Handwashing is the #1 way to prevent the spread of germs. Stay away from sick people or people who are getting over a cold. If you develop respiratory systems such as green/yellow mucus production or productive cough or persistent cough let us know and we will see if you need an antibiotic. It is a good idea to keep a pair of gloves on when going into grocery stores/[employer] to decrease your risk of coming into contact with germs on the carts, etc. Carry alcohol hand gel with you at all times and use it frequently if out in public. If your temperature reaches 100.5 or higher please call the clinic and let  us know.  If it is after hours or on the weekend please go to the ER if your temperature is over 100.5.  Please have your own personal thermometer at  home to use.    Sex and bodily fluids If you are going to have sex, a condom must be used to protect the person that isn't taking chemotherapy. Chemo can decrease your libido (sex drive). For a few days after chemotherapy, chemotherapy can be excreted through your bodily fluids.  When using the toilet please close the lid and flush the toilet twice.  Do this for a few day after you have had chemotherapy.     Effects of chemotherapy on your sex life Some changes are simple and won't last long. They won't affect your sex life permanently. Sometimes you may feel:  too tired  not strong enough to be very active  sick or sore   not in the mood  anxious or low Your anxiety might not seem related to sex. For example, you may be worried about the cancer and how your treatment is going. Or you may be worried about money, or about how you family are coping with your illness. These things can cause stress, which can affect your interest in sex. It's important to talk to your partner about how you feel. Remember - the changes to your sex life don't usually last long. There's usually no medical reason to stop having sex during chemo. The drugs won't have any long term physical effects on your performance or enjoyment of sex. Cancer can't be passed on to your partner during sex  Contraception It's important to use reliable contraception during treatment. Avoid getting pregnant while you or your partner are having chemotherapy. This isbecause the drugs may harm the baby. Sometimes chemotherapy drugs can leave a man or woman infertile.  This means you would not be able to have children in the future. You might want to talk to someone about permanent infertility. It can be very difficult to learn that you may no longer be able to have children. Some  people find counselling helpful. There might be ways to preserve your fertility, although this is easier for men than for women. You may want to speak to a fertility expert. You can talk about sperm banking or harvesting your eggs. You can also ask about other fertility options, such as donor eggs. If you haveor have had breast cancer, your doctor might advise you not to take the contraceptive pill. This is because the hormones in it might affect the cancer. It is not known for sure whether or not chemotherapy drugs can be passed on through semen or secretions from the vagina. Because of this some doctors advise people to use a barrier method if you have sex during treatment. This applies to vaginal, anal or oral sex. Generally, doctors advise a barrier method only for the time you are actually having the treatment and for about a week after your treatment. Advice like this can be worrying, but this does not mean that you have to avoid being intimate with your partner. You can still have close contact with your partner and continue to enjoy sex.  Animals If you have cats or birds we just ask that you not change the litter or change the cage.  Please have someone else do this for you while you are on chemotherapy.   Food Safety During and After Cancer Treatment Food safety is important for people both during and after cancer treatment. Cancer and cancer treatments, such as chemotherapy, radiation therapy, and stem cell/bone marrow transplantation, often weaken the immune system. This makes it harder for your body to protect itself from  foodborne illness, also called food poisoning. Foodborne illness is caused by eating food that contains harmful bacteria, parasites, or viruses.  Foods to avoid Some foods have a higher risk of becoming tainted with bacteria. These include:  Unwashed fresh fruit and vegetables, especially leafy vegetables that can hide dirt and other contaminants  Raw sprouts,  such as alfalfa sprouts  Raw or undercooked beef, especially ground beef, or other raw or undercooked meat and poultry  Fatty, fried, or spicy foods immediately before or after treatment.  These can sit heavy on your stomach and make you feel nauseous.  Raw or undercooked shellfish, such as oysters.  Sushi and sashimi, which often contain raw fish.   Unpasteurized beverages, such as unpasteurized fruit juices, raw milk, raw yogurt, or cider  Undercooked eggs, such as soft boiled, over easy, and poached; raw, unpasteurized eggs; or foods made with raw egg, such as homemade raw cookie dough and homemade mayonnaise Simple steps for food safety Shop smart.  Do not buy food stored or displayed in an unclean area.  Do not buy bruised or damaged fruits or vegetables.  Do not buy cans that have cracks, dents, or bulges.  Pick up foods that can spoil at the end of your shopping trip and store them in a cooler on the way home. Prepare and clean up foods carefully.  Rinse all fresh fruits and vegetables under running water, and dry them with a clean towel or paper towel.  Clean the top of cans before opening them.  After preparing food, wash your hands for 20 seconds with hot water and soap. Pay special attention to areas between fingers and under nails.  Clean your utensils and dishes with hot water and soap.  Disinfect your kitchen and cutting boards using 1 teaspoon of liquid, unscented bleach mixed into 1 quart of water.  Dispose of old food.  Eat canned and packaged food before its expiration date (the "use by" or "best before" date).  Consume refrigerated leftovers within 3 to 4 days. After that time, throw out the food. Even if the food does not smell or look spoiled, it still may be unsafe. Some bacteria, such as Listeria, can grow even on foods stored in the refrigerator if they are kept for too long. Take precautions when eating out.  At restaurants, avoid buffets and salad  bars where food sits out for a long time and comes in contact with many people. Food can become contaminated when someone with a virus, often a norovirus, or another "bug" handles it.  Put any leftover food in a "to-go" container yourself, rather than having the server do it. And, refrigerate leftovers as soon as you get home.  Choose restaurants that are clean and that are willing to prepare your food as you order it cooked.     Gemcitabine (Gemzar)   About This Drug Gemcitabine is used to treat cancer. It is given in the vein (IV).  Possible Side Effects  . Bone marrow depression. This is a decrease in the number of white blood cells, red blood cells, and platelets. This may raise your risk of infection, make you tired and weak (fatigue), and raise your risk of bleeding . Fever . Trouble breathing . Nausea and throwing up (vomiting) . Changes in your liver function . Your kidneys may be affected causing protein in your urine . Blood in your urine . Rash . Swelling of your legs, ankles and/or feet Note: Each of the side effects above  was reported in 20% or greater of patients treated with Gemcitabine. Not all possible side effects are included above.  Warnings and Precautions . Severe bone marrow depression . Inflammation (swelling) of the lungs and/or thickening of the lung tissues, which may be lifethreatening. You may have a dry cough or trouble breathing. . Changes in your kidney function, which can cause kidney failure . Changes in your liver function, which can cause liver failure and may be life-threatening . If you have received radiation treatments, your skin may become red and/or you may develop soreness of the mouth and throat after gemcitabine. This reaction is called "recall." Your body is recalling, or remembering, that it had radiation therapy. . A syndrome where fluid from your veins can leak into your tissues and cause a decrease in your blood pressure  and fluid to accumulate in your tissues and/or lungs . A syndrome can occur that causes changes to kidney and liver function in combination with a decrease in red blood cells. Kidney failure may result which may be life-threatening. . Changes in your central nervous system can happen. The central nervous system is made up of your brain and spinal cord. You could feel extreme tiredness, agitation, confusion, hallucinations (see or hear things that are not there), have trouble understanding or speaking, loss of control of your bowels or bladder, eyesight changes, numbness or lack of strength to your arms, legs, face, or body, seizures or coma. If you start to have any of these symptoms let your doctor know right away. Note: Some of the side effects above are very rare. If you have concerns and/or questions, please discuss them with your medical team.  Important Information . This drug may be present in the saliva, tears, sweat, urine, stool, vomit, semen, and vaginal secretions. Talk to your doctor and/or your nurse about the necessary precautions to take during this time.  Treating Side Effects . Manage tiredness by pacing your activities for the day. . Be sure to include periods of rest between energy-draining activities. . To decrease infection, wash your hands regularly. . Avoid close contact with people who have a cold, the flu, or other infections. . Take your temperature as your doctor or nurse tells you, and whenever you feel like you may have a fever. . To help decrease bleeding, use a soft toothbrush. Check with your nurse before using dental floss. . Be very careful when using knives or tools. . Use an electric shaver instead of a razor. . Drink plenty of fluids (a minimum of eight glasses per day is recommended). . If you throw up or have loose bowel movements, you should drink more fluids so that you do not become dehydrated (lack water in the body from losing too much  fluid). . To help with nausea and vomiting, eat small, frequent meals instead of three large meals a day. Choose foods and drinks that are at room temperature. Ask your nurse or doctor about other helpful tips and medicine that is available to help or stop lessen these symptoms. . If you get a rash do not put anything on it unless your doctor or nurse says you may. Keep the area around the rash clean and dry. Ask your doctor for medicine if your rash bothers you. . If you received radiation, and your skin becomes red or irritated again, or you develop soreness of the mouth and throat, follow the same care instructions you did during radiation treatment. Be sure to tell the nurse or  doctor administering your chemotherapy about your skin changes.  Food and Drug Interactions . There are no known interactions of gemcitabine with food and other medications. . Tell your doctor and pharmacist about all the prescription and over-the-counter medicines and dietary supplements (vitamins, minerals, herbs and others) that you are taking at this time. The safety and use of dietary supplements and alternative diets are often not known. Using these might affect your cancer or interfere with your treatment. Until more is known, you should not use dietary supplements or alternative diets without your cancer doctor's help.  When to Call the Doctor Call your doctor or nurse if you have any of these symptoms and/or any new or unusual symptoms: . Fever of 100.5 F (38 C) or higher . Chills . Fatigue that interferes with your daily activities . Feeling dizzy or lightheaded . Pain in your chest . Dry cough . Wheezing and/or trouble breathing . Confusion and/or agitation . Seizures . Hallucinations . Trouble understanding or speaking . Blurry vision or changes in your eyesight . Numbness or lack of strength to your arms, legs, face, or body . Easy bleeding or bruising . Nausea that stops you from eating or  drinking and/or is not relieved by prescribed medicines . Throwing up more than 3 times a day . Swelling of legs, ankles, or feet . Weight gain of 5 pounds in one week (fluid retention) . Blood in urine . Decreased urine . A new rash or a rash that is not relieved by prescribed medicines . Signs of possible liver problems: dark urine, pale bowel movements, bad stomach pain, feeling very tired and weak, unusual itching, or yellowing of the eyes or skin . If you think you may be pregnant or may have impregnated your partner  Reproduction Warnings . Pregnancy warning: This drug can have harmful effects on the unborn baby. Women of childbearing potential should use effective methods of birth control during your cancer treatment and for 6 months after treatment. Men with female partners of childbearing potential should use effective methods of birth control during your cancer treatment and for 3 months after your cancer treatment. Let your doctor know right away if you think you may be pregnant or may have impregnated your partner. . Breastfeeding warning: Women should not breast feed during treatment and for 1 week after treatment because this drug could enter the breast milk and cause harm to a breastfeeding baby. . Fertility warning: In men, this drug may affect your ability to have children in the future. Talk with your doctor or nurse if you plan to have children. Ask for information on sperm banking. Revised February 2018    MEDICATIONS:      Zofran/Ondansetron 8mg  tablet. Take 1 tablet every 8 hours as needed for nausea/vomiting. (#1 nausea med to take, this can constipate)  Compazine/Prochlorperazine 10mg  tablet. Take 1 tablet every 6 hours as needed for nausea/vomiting. (#2 nausea med to take, this can make you sleepy)   Over-the-Counter Meds:  Miralax 1 capful in 8 oz of fluid daily. May increase to two times a day if needed. This is a stool softener. If this doesn't  work proceed you can add:  Senokot S-start with 1 tablet two times a day and increase to 4 tablets two times a day if needed. (total of 8 tablets in a 24 hour period). This is a stimulant laxative.   Call us if this does not help your bowels move.   Imodium 2mg  capsule. Take 2  capsules after the 1st loose stool and then 1 capsule every 2 hours until you go a total of 12 hours without having a loose stool. Call the [facility] if loose stools continue. If diarrhea occurs @ bedtime, take 2 capsules @ bedtime. Then take 2 capsules every 4 hours until morning. Call [facility].     Diarrhea Sheet  If you are having loose stools/diarrhea, please purchase Imodium and begin taking as outlined:  At the first sign of poorly formed or loose stools you should begin taking Imodium(loperamide) 2 mg capsules.  Take two caplets (4mg ) followed by one caplet (2mg ) every 2 hours until you have had no diarrhea for 12 hours.  During the night take two caplets (4mg ) at bedtime and continue every 4 hours during the night until the morning.  Stop taking Imodium only after there is no sign of diarrhea for 12 hours.    Always call the [facility] if you are having loose stools/diarrhea that you can't get under control.  Loose stools/disrrhea leads to dehydration (loss of water) in your body.  We have other options of trying to get the loose stools/diarrhea to stopped but you must let us know!     Constipation Sheet *Miralax in 8 oz of fluid daily.  May increase to two times a day if needed.  This is a stool softener.  If this not enough to keep your bowel regular:  You can add:  *Senokot S, start with one tablet twice a day and can increase to 4 tablets twice a day if needed.  This is a stimulant laxative.   Sometimes when you take pain medication you need BOTH a medicine to keep your stool soft and a medicine to help your bowel push it out!  Please call if the above does not work for you.    Do not go more than 2 days without a bowel movement.  It is very important that you do not become constipated.  It will make you feel sick to your stomach (nausea) and can cause abdominal pain and vomiting.     Nausea Sheet  Zofran/Ondansetron 8mg  tablet. Take 1 tablet every 8 hours as needed for nausea/vomiting. (#1 nausea med to take, this can constipate)  Compazine/Prochlorperazine 10mg  tablet. Take 1 tablet every 6 hours as needed for nausea/vomiting. (#2 nausea med to take, this can make you sleepy)  You can take these medications together or separately.  We would first like for you to try the Ondansetron by itself and then take the Prochloperizine if needed. But you are allowed to take both medications at the same time if your nausea is that severe.  If you are having persistent nausea (nausea that does not stop) please take these medications on a staggered schedule so that the nausea medication stays in your body.  Please call the [facility] and let us know the amount of nausea that you are experiencing.  If you begin to vomit, you need to call the [facility] and if it is the weekend and you have vomited more than one time and cant get it to stop-go to the Emergency Room.  Persistent nausea/vomiting can lead to dehydration (loss of fluid in your body) and will make you feel terrible.   Ice chips, sips of clear liquids, foods that are @ room temperature, crackers, and toast tend to be better tolerated.     SYMPTOMS TO REPORT AS SOON AS POSSIBLE AFTER TREATMENT:  FEVER GREATER THAN 100.5  F  CHILLS WITH OR WITHOUT FEVER  NAUSEA AND VOMITING THAT IS NOT CONTROLLED WITH YOUR NAUSEA MEDICATION  UNUSUAL SHORTNESS OF BREATH  UNUSUAL BRUISING OR BLEEDING  TENDERNESS IN MOUTH AND THROAT WITH OR WITHOUT PRESENCE OF ULCERS  URINARY PROBLEMS  BOWEL PROBLEMS  UNUSUAL RASH    Wear comfortable clothing and clothing appropriate for easy access to any Portacath or  PICC line. Let us know if there is anything that we can do to make your therapy better!    What to do if you need assistance after hours or on the weekends: CALL [phone].  HOLD on the line, do not hang up.  You will hear multiple messages but at the end you will be connected with a nurse triage line.  They will contact the doctor if necessary.  Most of the time they will be able to assist you.  Do not call the hospital operator.       I have been informed and understand all of the instructions given to me and have received a copy. I have been instructed to call the clinic [phone] or my family physician as soon as possible for continued medical care, if indicated. I do not have any more questions at this time but understand that I may call the [facility] or the Patient Navigator at [phone] during office hours should I have questions or need assistance in obtaining follow-up care.

## 2016-11-15 NOTE — Patient Instructions (Signed)
[facility] at [facility] Discharge Instructions  RECOMMENDATIONS MADE BY THE CONSULTANT AND ANY TEST RESULTS WILL BE SENT TO YOUR REFERRING PHYSICIAN.  You were seen today by Dr. Louise Zhou    Thank you for choosing [facility] at [facility] to provide your oncology and hematology care.  To afford each patient quality time with our provider, please arrive at least 15 minutes before your scheduled appointment time.    If you have a lab appointment with the Cancer Center please come in thru the  Main Entrance and check in at the main information desk  You need to re-schedule your appointment should you arrive 10 or more minutes late.  We strive to give you quality time with our providers, and arriving late affects you and other patients whose appointments are after yours.  Also, if you no show three or more times for appointments you may be dismissed from the clinic at the providers discretion.     Again, thank you for choosing [facility].  Our hope is that these requests will decrease the amount of time that you wait before being seen by our physicians.       _____________________________________________________________  Should you have questions after your visit to [facility], please contact our office at [phone] between the hours of 8:30 a.m. and 4:30 p.m.  Voicemails left after 4:30 p.m. will not be returned until the following business day.  For prescription refill requests, have your pharmacy contact our office.       Resources For Cancer Patients and their Caregivers ? American Cancer Society: Can assist with transportation, wigs, general needs, runs Look Good Feel Better.        1-888-227-6333 ? Cancer Care: Provides financial assistance, online support groups, medication/co-pay assistance.  1-800-813-HOPE (4673) ? [facility] Assists [place] cancer patients and their  families through emotional , educational and financial support.  336-427-4357 ? [place] DSS Where to apply for food stamps, Medicaid and utility assistance. 336-342-1394 ? [facility]: Transportation to medical appointments. 336-347-2287 ? Social Security Administration: May apply for disability if have a Stage IV cancer. 336-342-7796 1-800-772-1213 ? [place] [facility]ging, Disability and Transit Services: Assists with nutrition, care and transit needs. 336-349-2343  Cancer Center Support Programs: [email or web address]@ > Cancer Support Group  2nd Tuesday of the month 1pm-2pm, Journey Room  > Creative Journey  3rd Tuesday of the month 1130am-1pm, Journey Room  > Look Good Feel Better  1st Wednesday of the month 10am-12 noon, Journey Room (Call American [employer] to register 1-800-395-5775)    

## 2016-11-15 NOTE — Progress Notes (Signed)
[place], [place] [address]   CLINIC:  Medical Oncology/Hematology  PCP:  Monico Blitz, MD [place] [address] [phone]   REASON FOR VISIT:  Follow-up for Stage IV adenocarcinoma of pancreas with lung mets   CURRENT THERAPY: 5-FU/Leucovorin/Liposomal Irinotecan, resumed 04/26/16 (palliative treatment)   BRIEF ONCOLOGIC HISTORY:    Pancreatic cancer metastasized to lung [facility])   09/03/2008 Surgery    Whipple with Dr. Birdie Sons for T3N1 3/12 LN+ Pancreatic Adenocarcinoma      11/13/2008 - 09/12/2009 Chemotherapy    Adjuvant Gemcitabine X 6 months      08/31/2009 Surgery    Metastectomy with Dr. Arlyce Dice RUL 2 nodules, RLL (wedge resection X 2)      08/31/2009 Pathology Results    Metastatic adenocarcinoma TTF-1 negative      06/03/2010 Surgery    Left video-assisted thoracoscopic surgery, resection of left lower lobe lesion. with Dr. Arlyce Dice      06/04/2010 Pathology Results    adenocarcinoma positive for cytokeratin 7, focally positive for CDX2, with scattered staining for TTF1 cytokeratin 20. [facility] opinion felt to be lung primary although noted to stain for GI markers, not felt to be pancreatic primary. EGFR and ALK negative      03/31/2011 - 09/04/2011 Radiation Therapy    SBRT LUL 18Gy Dr. Tammi Klippel      03/15/2012 - 06/13/2012 Chemotherapy    5-FU based chemotherapy      02/13/2013 - 05/20/2013 Chemotherapy    Gemzar Abraxane for metastatic disease (given to cover both primaries at [facility])      07/15/2014 PET scan    Mild progression of pulm mets, hypermet mesenteric tumor assoc with  SMA is stable to slightly increased in degree of FDG uptake      07/23/2014 Imaging         07/30/2014 - 09/10/2014 Chemotherapy    Gemzar abraxane stopped due to poor tolerance      04/06/2015 Procedure    L 20 French chest tube for spontaneous pneumothorax, by Dr. Tharon Aquas Trigt      04/20/2015 Pathology Results   High grade carcinoma c/w patient's known pancreatic primary (secondary opinion at [facility]) PDL-1 high expression, preserved major and minor MMR, MSI stable, [facility] ONE testing performed,       04/20/2015 Surgery    Left VATS, mini thoracotomy with stapling and oversewing of blebs, Talc pleurodesis. performed secondary to persistent air leak not improved with chest tube drainage      07/29/2015 Imaging    No signif change in appear of multifocal B cavitary pulmonary lesions, postop appearance upper abdomen, no findings of met disease to abd or pelvis,      09/29/2015 Imaging    Slight progression of multifocal cavitary pulmonary metastases, as discussed above. 2. Interval development of a lytic lesion in the antral lateral aspect of the left fifth rib where there is a nondisplaced pathologic fracture. No other definite osseous metastases are identified. 3. Prominent soft tissue adjacent to the proximal superior mesenteric artery and in the aortocaval nodal station, similar to prior examinations, likely to represent treated metastatic lymphadenopathy. 4. Epigastric ventral hernia containing several short loops of small bowel. There is a focally dilated loop of small bowel adjacent to this, however, this is an isolated finding. No other dilatation of small bowel or colon to suggest frank bowel obstruction at this time.       10/19/2015 - 03/01/2016 Chemotherapy  The patient had palonosetron (ALOXI) injection 0.25 mg, 0.25 mg, Intravenous,  Once, 10 of 12 cycles  leucovorin 652 mg in dextrose 5 % 250 mL infusion, 400 mg/m2 = 652 mg, Intravenous,  Once, 10 of 12 cycles  fluorouracil (ADRUCIL) 2,950 mg in sodium chloride 0.9 % 91 mL chemo infusion, 1,800 mg/m2 = 2,950 mg (100 % of original dose 1,800 mg/m2), Intravenous, 1 Day/Dose, 10 of 12 cycles Dose modification: 1,920 mg/m2 (original dose 1,800 mg/m2, Cycle 1, Reason: Provider Judgment), 1,800 mg/m2 (original dose  1,800 mg/m2, Cycle 1, Reason: Provider Judgment), 1,500 mg/m2 (original dose 1,800 mg/m2, Cycle 2, Reason: Dose not tolerated)  irinotecan LIPOSOME (ONIVYDE) 55.9 mg in sodium chloride 0.9 % 500 mL chemo infusion, 35 mg/m2 = 55.9 mg (100 % of original dose 35 mg/m2), Intravenous, Once, 10 of 12 cycles Dose modification: 35 mg/m2 (original dose 35 mg/m2, Cycle 1, Reason: Provider Judgment)  for chemotherapy treatment.        01/12/2016 Imaging    CT CAP- 1. Cavitary lung metastases are stable to decreased in size. 2. Expansile mixed lytic and sclerotic bone metastasis in the anterior left fifth rib is increased in sclerosis and mildly increased in size. 3. Infiltrative soft tissue in the retroperitoneum abutting the SMA is stable. 4. No new sites of metastatic disease in the chest, abdomen or pelvis.      03/05/2016 Procedure    Chest tube placed for spontaneous pneumothorax by Dr. Cyndia Bent      03/05/2016 - 03/10/2016 Hospital Admission    Admit date: 03/05/2016 Admission diagnosis: Spontenous pneumothorax Additional comments: chest tube placed by Dr. Cyndia Bent on 03/05/2016      03/14/2016 - 03/18/2016 Hospital Admission    Admit date: 03/14/2016 Admission diagnosis: Recurrent spontaneous pneumothorax Additional comments:       04/26/2016 -  Chemotherapy    The patient had palonosetron (ALOXI) injection 0.25 mg, 0.25 mg, Intravenous,  Once, 10 of 12 cycles  leucovorin 652 mg in dextrose 5 % 250 mL infusion, 400 mg/m2 = 652 mg, Intravenous,  Once, 10 of 12 cycles  fluorouracil (ADRUCIL) 2,950 mg in sodium chloride 0.9 % 91 mL chemo infusion, 1,800 mg/m2 = 2,950 mg (100 % of original dose 1,800 mg/m2), Intravenous, 1 Day/Dose, 10 of 12 cycles Dose modification: 1,920 mg/m2 (original dose 1,800 mg/m2, Cycle 1, Reason: Provider Judgment), 1,800 mg/m2 (original dose 1,800 mg/m2, Cycle 1, Reason: Provider Judgment), 1,500 mg/m2 (original dose 1,800 mg/m2, Cycle 2, Reason: Dose not  tolerated)  irinotecan LIPOSOME (ONIVYDE) 55.9 mg in sodium chloride 0.9 % 500 mL chemo infusion, 35 mg/m2 = 55.9 mg (100 % of original dose 35 mg/m2), Intravenous, Once, 10 of 12 cycles Dose modification: 35 mg/m2 (original dose 35 mg/m2, Cycle 1, Reason: Provider Judgment)  for chemotherapy treatment.        05/09/2016 Imaging    CT CAP- CT CHEST IMPRESSION  1. Mild progression of pulmonary metastasis. Although the majority of cavitary lung lesions are primarily similar, there are new and increased small cavitary nodules. 2. Trace residual loculated inferolateral right pleural air. 3. No thoracic adenopathy. 4. Progression of anterior left fifth rib metastasis with soft tissue component surrounding.  CT ABDOMEN AND PELVIS IMPRESSION  1. Status post Whipple procedure, without locally recurrent disease. 2. Similar soft tissue fullness within the retroperitoneum. 3. Pneumobilia. Possible mild cirrhosis. 4. Similar small bowel containing ventral abdominal wall laxity. 5. Similar abdominal aortic aneurysm.      05/09/2016 Progression    Progression of disease  after being off therapy x 2 months.      07/26/2016 Imaging    CT CAP- 1. Stable appearance of pulmonary metastases. 2. No thoracic adenopathy. 3. 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 4. Status post Whipple procedure. 5. Similar soft tissue within the retroperitoneum involving the SMA and aorto caval space. 6. Pneumobilia and possible mild cirrhosis. 7. Ventral abdominal wall hernia containing nonobstructed loops of small bowel. 8. Abdominal aortic aneurysm measures 3.7 cm. Unchanged from previous exam. 9. Aortic Atherosclerosis (ICD10-I70.0) and Emphysema (ICD10-J43.9). Coronary artery calcifications noted.      07/26/2016 Imaging    CT head- 1. No metastatic disease or acute intracranial abnormality identified. 2. Recannulized right transverse sinus through right IJ bulb thrombosis since  the 2016 MRI. Small volume of residual chronic nonocclusive thrombus. 3. Mild for age chronic small vessel disease is unchanged since 2016.      11/10/2016 Imaging    CT C/A/P: IMPRESSION: CT CHEST IMPRESSION  1. Mild progression of pulmonary metastasis. 2. Slight enlargement of a  left fifth rib metastasis. 3. No thoracic adenopathy. 4.  Emphysema (ICD10-J43.9). 5. Coronary artery atherosclerosis. Aortic Atherosclerosis (ICD10-I70.0).  CT ABDOMEN AND PELVIS IMPRESSION  1. Status post Whipple procedure. No evidence of progressive disease within the abdomen. 2. Grossly similar soft tissue thickening about the origin of the superior mesenteric artery. 3. Probable cirrhosis. 4. Similar ventral abdominal wall hernia containing nonobstructive small bowel. 5. Similar infrarenal aortic ectasia.        INTERVAL HISTORY:  [name] [name] 76 y.o. female returns to cancer center today for routine follow-up. She states that she has been doing well except for fatigue. Her energy level is 50-75%. She still performs all her ADLs. Appetite is good and she denies any weight loss. No recent diarrhea, nausea, constipation, chest pain, shortness of breath.   REVIEW OF SYSTEMS:  Review of Systems  Constitutional: Positive for fatigue. Negative for chills and fever.  HENT:   Negative for mouth sores.   Eyes: Negative.   Respiratory: Negative for cough and shortness of breath.   Cardiovascular: Negative.   Gastrointestinal: Negative for abdominal pain, blood in stool, constipation, diarrhea, nausea and vomiting.  Endocrine: Negative.   Genitourinary: Negative for dysuria and hematuria.   Musculoskeletal: Negative.   Skin: Negative.  Negative for rash.  Neurological: Negative.   Psychiatric/Behavioral: Negative.      PAST MEDICAL/SURGICAL HISTORY:  Past Medical History:  Diagnosis Date  . Abdominal wall hernia   . Anemia   . Anemia due to antineoplastic chemotherapy 07/05/2016  .  Aortic aneurysm (HCC)    small aortic aneurysm  . Arthritis    "little in my right hand" (03/10/2016)  . Cervical cancer (HCC) 1963  . COPD (chronic obstructive pulmonary disease) (HCC)   . Emphysema   . GERD (gastroesophageal reflux disease)   . Goals of care, counseling/discussion 02/02/2016  . Hypercholesterolemia   . Hypertension   . Low serum vitamin B12 01/20/2016  . Pancreatic adenocarcinoma (HCC) July 2010  . Pancreatic cancer metastasized to lung (HCC) 09/04/2015   Past Surgical History:  Procedure Laterality Date  . BREAST CYST EXCISION Right 1988  . BUNIONECTOMY Bilateral 2007  . CARDIAC CATHETERIZATION    . CATARACT EXTRACTION W/ INTRAOCULAR LENS  IMPLANT, BILATERAL Bilateral 2009  . CHOLECYSTECTOMY  July 2010   done w/ Whipple procedure  . IR GENERIC HISTORICAL  03/15/2016   IR GUIDED DRAIN W CATHETER PLACEMENT 03/15/2016 Michael Shick, MD [facility]-INTERV RAD  .   SKIN CANCER EXCISION Left    nasal fold  . STAPLING OF BLEBS Left 04/20/2015   Procedure: STAPLING OF BLEBS;  Surgeon: Peter Van Trigt, MD;  Location: [facility] OR;  Service: Thoracic;  Laterality: Left;  . THYROID SURGERY     "had a growth taken off"  . TONSILLECTOMY  1943  . VAGINAL HYSTERECTOMY  1963   Cervical Cancer  . VIDEO ASSISTED THORACOSCOPY Left 04/20/2015   Procedure: VIDEO ASSISTED THORACOSCOPY;  Surgeon: Peter Van Trigt, MD;  Location: [facility] OR;  Service: Thoracic;  Laterality: Left;  . WHIPPLE PROCEDURE  July 2010   pancreatic adenocarcinoma     SOCIAL HISTORY:  Social History   Social History  . Marital status: Widowed    Spouse name: N/A  . Number of children: 0  . Years of education: N/A   Occupational History  .  Retired   Social History Main Topics  . Smoking status: Former Smoker    Packs/day: 1.00    Years: 30.00    Types: Cigarettes    Quit date: 02/29/1996  . Smokeless tobacco: Never Used  . Alcohol use No  . Drug use: No  . Sexual activity: No   Other Topics Concern  . Not on file    Social History Narrative  . No narrative on file    FAMILY HISTORY:  Family History  Problem Relation Age of Onset  . Pancreatic cancer Brother   . Colon cancer Sister 65       12/2006 dx surgery and chemotherapy  . Ovarian cancer Sister 66       Ovarian ca,  surgery and chemotherapy    CURRENT MEDICATIONS:  Outpatient Encounter Prescriptions as of 11/15/2016  Medication Sig Note  . acetaminophen (TYLENOL) 325 MG tablet Take 2 tablets (650 mg total) by mouth every 6 (six) hours as needed for mild pain (or Fever >/= 101).   . amoxicillin (AMOXIL) 500 MG capsule TAKE ONE CAPSULE BY MOUTH THREE TIMES DAILY WITH FOOD   . cholecalciferol (VITAMIN D) 1000 units tablet Take 2,000 Units by mouth daily.   . CREON 36000 units CPEP capsule TAKE 1 CAPSULE THREE TIMES A DAY WITH MEALS   . diphenoxylate-atropine (LOMOTIL) 2.5-0.025 MG tablet Take 1 tablet by mouth 4 (four) times daily as needed for diarrhea or loose stools.   . feeding supplement, ENSURE ENLIVE, (ENSURE ENLIVE) LIQD Take 237 mLs by mouth 2 (two) times daily between meals.   . hydrochlorothiazide (HYDRODIURIL) 25 MG tablet Take 12.5 mg by mouth every morning.  03/14/2016: 1/2 TAB  . ibuprofen (ADVIL,MOTRIN) 400 MG tablet Take 1 tablet (400 mg total) by mouth every 6 (six) hours as needed for mild pain.   . lidocaine (XYLOCAINE) 2 % solution Use as directed 20 mLs in the mouth or throat as needed for mouth pain.   . loperamide (IMODIUM A-D) 2 MG tablet At the onset of diarrhea, take 2 tabs. Then take 1 tab every 2 hours until you have gone 12 hours without having a loose stool. If it is bedtime and you have a loose stool(s) take 2 tabs every 4 hours until morning. Call Cancer Center.   . losartan (COZAAR) 100 MG tablet Take 100 mg by mouth every morning.    . magic mouthwash w/lidocaine SOLN Take 5 mLs by mouth 4 (four) times daily as needed for mouth pain.   . metoprolol succinate (TOPROL-XL) 50 MG 24 hr tablet Take 25 mg by mouth  every morning. Take with   or immediately following a meal.  03/14/2016: 1/2 TAB  . omeprazole (PRILOSEC) 20 MG capsule Take 20 mg by mouth daily before breakfast.  03/05/2016: PATIENT CANNOT TAKE PROTONIX (IN-HOUSE)  . ondansetron (ZOFRAN) 8 MG tablet Take 1 tablet (8 mg total) by mouth every 8 (eight) hours as needed for nausea or vomiting.   . prochlorperazine (COMPAZINE) 10 MG tablet Take 1 tablet (10 mg total) by mouth every 6 (six) hours as needed for nausea or vomiting.   . XARELTO 20 MG TABS tablet Take 1 tablet (20 mg total) by mouth daily with supper.    Facility-Administered Encounter Medications as of 11/15/2016  Medication  . sodium chloride flush (NS) 0.9 % injection 10 mL    ALLERGIES:  Allergies  Allergen Reactions  . Oxaliplatin Anaphylaxis  . Pantoprazole Other (See Comments)    THIS GIVES THE PATIENT TERRIBLE HEARTBURN (PLEASE DO NOT GIVE)  . Adhesive [Tape] Rash  . Aspirin Other (See Comments)    Burns stomach  . Neulasta [Pegfilgrastim] Other (See Comments)    UNSURE   . Oxycodone Nausea And Vomiting  . Tramadol Nausea And Vomiting     PHYSICAL EXAM:  ECOG Performance status: 1-2 - Symptomatic; remains largely independent, but requires occasional assistance    Physical Exam  Constitutional: She is oriented to person, place, and time.  -Thin frail female in no acute distress   HENT:  Head: Normocephalic.  Eyes: Conjunctivae are normal. No scleral icterus.  Neck: Normal range of motion. Neck supple.  Cardiovascular: Normal rate and regular rhythm.   Pulmonary/Chest: Effort normal and breath sounds normal. No respiratory distress. She has no wheezes. She has no rales.  Abdominal: Soft. Bowel sounds are normal. There is no tenderness.  Musculoskeletal: Normal range of motion. She exhibits no edema.  Lymphadenopathy:    She has no cervical adenopathy.       Right: No supraclavicular adenopathy present.       Left: No supraclavicular adenopathy present.   Neurological: She is alert and oriented to person, place, and time. No cranial nerve deficit.  Skin: Skin is warm and dry. No rash noted.  Psychiatric: Mood, memory, affect and judgment normal.  Nursing note and vitals reviewed.    LABORATORY DATA:  I have reviewed the labs as listed.  CBC    Component Value Date/Time   WBC 6.3 11/15/2016 1005   RBC 3.15 (L) 11/15/2016 1005   HGB 9.8 (L) 11/15/2016 1005   HCT 30.8 (L) 11/15/2016 1005   PLT 195 11/15/2016 1005   MCV 97.8 11/15/2016 1005   MCH 31.1 11/15/2016 1005   MCHC 31.8 11/15/2016 1005   RDW 16.8 (H) 11/15/2016 1005   LYMPHSABS 1.1 11/15/2016 1005   MONOABS 0.6 11/15/2016 1005   EOSABS 0.1 11/15/2016 1005   BASOSABS 0.0 11/15/2016 1005   CMP Latest Ref Rng & Units 11/02/2016 10/18/2016 10/04/2016  Glucose 65 - 99 mg/dL 102(H) 88 125(H)  BUN 6 - 20 mg/dL 13 17 21(H)  Creatinine 0.44 - 1.00 mg/dL 1.15(H) 1.17(H) 1.13(H)  Sodium 135 - 145 mmol/L 138 138 135  Potassium 3.5 - 5.1 mmol/L 4.2 3.5 3.9  Chloride 101 - 111 mmol/L 109 114(H) 107  CO2 22 - 32 mmol/L 20(L) 15(L) 20(L)  Calcium 8.9 - 10.3 mg/dL 9.1 8.8(L) 8.9  Total Protein 6.5 - 8.1 g/dL 6.4(L) 6.5 6.6  Total Bilirubin 0.3 - 1.2 mg/dL 0.7 0.6 1.2  Alkaline Phos 38 - 126 U/L 111 107 113  AST 15 -   41 U/L 15 16 31  ALT 14 - 54 U/L 9(L) 12(L) 21      PENDING LABS:    DIAGNOSTIC IMAGING:  *The following radiologic images and reports have been reviewed independently and agree with below findings.  CT chest/abd/pelvis: 07/26/16       CT head: 07/26/16       PATHOLOGY:  (L) lung wedge resection path: 04/20/15           ASSESSMENT & PLAN:   Stage IV adenocarcinoma of pancreas with lung mets: -Initially diagnosed in 08/2008. Treated with Whipple by Dr. Howerton at [facility]. She received adjuvant chemotherapy with Gemcitabine x 6 months, completing in 08/2009. Found to have metastatic lung disease and underwent wedge  resection in 08/2009 for this metastatic disease. Underwent additional left lung resection of lesion felt to be lung primary malignancy with Dr. Burney in 05/2010. Underwent SBRT to lung with Dr. Manning, which completed on 09/04/11, followed by 5-FU based chemotherapy through 06/13/12. Then was treated with Gemzar/Abraxane for metastatic disease at [facility] in [place] from 02/13/13-05/20/13. Mild progression noted on scans in 06/2014. Gemzar/Abraxane was stopped d/t poor tolerance.  In 03/2015, patient had spontaneous (L) lung pneumothorax. Underwent (L) VATS and mini thoracotomy with path revealing high-grade carcinoma consistent with pancreatic primary. Progression of disease noted on imaging in 09/29/15 and chemotherapy with 5-FU/Leucovorin/Irinotecan started in 09/2015. Again, had spontaneous pneumothorax requiring chest tube placement on 03/05/16 with Dr. Bartle.  Chemotherapy was held as she recovered and resumed on 04/26/16. Progression of disease noted on CT imaging on 05/09/16, which was likely secondary to being off of therapy x 2 months with pneumothorax.    -Reviewed patient's CT scans in detail with her. She has evidence of slight pulmonary met progression. -Discussed switching chemo to gemzar + erlotinib given her progressive disease and patient is agreeable. I have reviewed side effects of treatment in detail with her today. Plan to start her new treatment on 11/22/16. Plan to treat for 3 cycles and then restage.    Chemo-induced anemia:  -On Aranesp every 2 weeks. Hgb 9.8 g/dL today, so Aranesp given today by nursing per protocol.  -Continue Aranesp every 2 weeks as scheduled.     Dispo:  -RTC on 12/20/16 for follow up.    All questions were answered to patient's stated satisfaction. Encouraged patient to call with any new concerns or questions before her next visit to the cancer center and we can certain see her sooner, if needed.      [name] Zhou, MD    

## 2016-11-15 NOTE — Telephone Encounter (Signed)
FAXED [facility]

## 2016-11-15 NOTE — Progress Notes (Signed)
Nutrition Follow-up:  ASSESSMENT: 76 y/o female/PMHx GERD, COPD, HTN, Abdominal Hernia and Stage IV adenocarcinoma pf pancreas w/ Lung metastases. S/p Whipple 2010. Patient seen as a follow up due to weight loss.   Patient has had continued progression of her disease, likely accounting for weight loss. She reports that her appetite had briefly decreased when she was suffering from recent tongue swelling, oral edema and mouth sores. However, she today says her intake has returned to baseline. In fact, she says she is "eating everything I can get my hands on".   She has intermittent diarrhea. Most often this occurs ~1 hour after eating. It is variable. Some days she will have 6 bowel movements and other days she will only have 1. She says the diarrhea is not related to certain foods or types of foods.    She has chronic fatigue that has also progressed. She has always been very active. She cooks, cleans and does chores routinely. She has been sitting down more.   Went through [employer]. Her meals are already appropriate. She eats gravy/biscuits very frequently. She eats high calorie foods, such as fat back, PBJ, Whole milk. She does have some food intolerances. She says she cannot eat eggs or cheese.   She states she weighed herself at home at 109 lbs. Today in the office, she is 112.4 bs. She had been stable at 118-121 lbs all of 2018 up until August. Again, likely related to disease progression  Medications: Supportive meds: Lomotil, Imodium, Zofran, compazine, PPI. Creo w/ meals. Starting Tarceva  Labs: None  Wt Readings from Last 10 Encounters:  11/15/16 112 lb 6.4 oz (51 kg)  11/02/16 113 lb 6.4 oz (51.4 kg)  10/18/16 112 lb 12.8 oz (51.2 kg)  10/04/16 114 lb 9.6 oz (52 kg)  09/13/16 118 lb 6.4 oz (53.7 kg)  08/30/16 118 lb (53.5 kg)  08/16/16 118 lb 9.6 oz (53.8 kg)  08/02/16 118 lb (53.5 kg)  07/20/16 119 lb (54 kg)  07/19/16 117 lb 9.6 oz (53.3 kg)   Anthropometrics:   Height: 5\' 4"  (162.56 cm) Weight: 112 lbs 6.4 oz (51 kg) BMI: 19.3  Estimated Energy Needs Kcals: 1800-2050 kcals (35-40 kcal/kg bw) Protein: 77-92 (1.5-1.8 g/kg bw) Fluid: >1.5 L (30 ml/kg) + enough to replace stool losses  NUTRITION DIAGNOSIS: Increased Protein/kcal needs related to cancer and cancer related treatments as evidenced by the recommendations for this condition  MALNUTRITION DIAGNOSIS: Moderate malnutrition. Chronic context. Mild-Moderate fat/muscle wasting.   INTERVENTION:  From information available, the patient sounds to already be eating a very high kcal/protein diet. There is little that could be improved on. RD re-emphasized the importance of "making the most of each bite". She should always be adding condiments, sauces or other "extras" to her meals. Gave several examples of high kcal/protein items.   Her diarrhea  sound to be related to her malignancy and treatments. Had thought that potentially her creon dosage needed to be modified given her increased frequency and intolerance to certain high fat items such as cheese. However, upon further questioning, the diarrhea is highly variable and she can eat very high fat meals, such as PBJ with whole milk, and have no side effects.   RD recommended that she start consuming supplements more regularly to increase kcal/proteins further. Gave specific recommendations. She had been consuming the [facility], but if she has no issues tolerating fat, she may be better served with the regular Boost products. She does not care for  Ensure.   She is not undergoing scheduled infusion today due to further disease progression. She will be starting a new treatments in about 1 week.    MONITORING, EVALUATION, GOAL: Weight, Goals of care  NEXT VISIT: As needed  Burtis Junes RD, LDN, CNSC Clinical Nutrition Pager: [phone] 11/15/2016 11:51 AM

## 2016-11-15 NOTE — Patient Instructions (Signed)
[place] at [facility]  Discharge Instructions:  You received aranesp today.  Call for any questions or concerns.  _______________________________________________________________  Thank you for choosing [place] at [facility] to provide your oncology and hematology care.  To afford each patient quality time with our providers, please arrive at least 15 minutes before your scheduled appointment.  You need to re-schedule your appointment if you arrive 10 or more minutes late.  We strive to give you quality time with our providers, and arriving late affects you and other patients whose appointments are after yours.  Also, if you no show three or more times for appointments you may be dismissed from the clinic.  Again, thank you for choosing [place] at [place] hope is that these requests will allow you access to exceptional care and in a timely manner. _______________________________________________________________  If you have questions after your visit, please contact our office at [phone] between the hours of 8:30 a.m. and 5:00 p.m. Voicemails left after 4:30 p.m. will not be returned until the following business day. _______________________________________________________________  For prescription refill requests, have your pharmacy contact our office. _______________________________________________________________  Recommendations made by the consultant and any test results will be sent to your referring physician. _______________________________________________________________

## 2016-11-16 ENCOUNTER — Telehealth (HOSPITAL_COMMUNITY): Payer: Self-pay | Admitting: Emergency Medicine

## 2016-11-16 ENCOUNTER — Encounter: Payer: Self-pay | Admitting: Cardiothoracic Surgery

## 2016-11-16 ENCOUNTER — Ambulatory Visit (INDEPENDENT_AMBULATORY_CARE_PROVIDER_SITE_OTHER): Payer: Medicare Other | Admitting: Cardiothoracic Surgery

## 2016-11-16 VITALS — BP 137/61 | HR 66 | Resp 16 | Ht 64.0 in | Wt 112.0 lb

## 2016-11-16 DIAGNOSIS — Z09 Encounter for follow-up examination after completed treatment for conditions other than malignant neoplasm: Secondary | ICD-10-CM | POA: Diagnosis not present

## 2016-11-16 DIAGNOSIS — C3412 Malignant neoplasm of upper lobe, left bronchus or lung: Secondary | ICD-10-CM

## 2016-11-16 NOTE — Progress Notes (Signed)
PCP is Monico Blitz, MD Referring Provider is Sheldon Silvan Manon Hilding, PA-C  Chief Complaint  Patient presents with  . Routine Post Op    4 month f/u...had CT C/A/P 11/10/16    HPI: Scheduled visit with recent CT scan of chest No problems with recurrent pneumothorax after a pigtail catheter placement 9 months ago Last CT scan images were personally reviewed and it does show some evidence of enlargement of her bilateral pancreatic metastases. She is symptomatic over the left fifth rib metastasis but requires only Tylenol. She has had some mild weight loss and chronic diarrhea from pancreatic insufficiency  The patient's oncologist will start her on Tarceva and Gemzar as her only option according to patient.  Most recent blood counts appear to be satisfactory  Past Medical History:  Diagnosis Date  . Abdominal wall hernia   . Anemia   . Anemia due to antineoplastic chemotherapy 07/05/2016  . Aortic aneurysm (HCC)    small aortic aneurysm  . Arthritis    "little in my right hand" (03/10/2016)  . Cervical cancer ([place]) 1963  . COPD (chronic obstructive pulmonary disease) ([place])   . Emphysema   . GERD (gastroesophageal reflux disease)   . Goals of care, counseling/discussion 02/02/2016  . Hypercholesterolemia   . Hypertension   . Low serum vitamin B12 01/20/2016  . Pancreatic adenocarcinoma [facility]) July 2010  . Pancreatic cancer metastasized to lung [facility]) 09/04/2015    Past Surgical History:  Procedure Laterality Date  . BREAST CYST EXCISION Right 1988  . BUNIONECTOMY Bilateral 2007  . CARDIAC CATHETERIZATION    . CATARACT EXTRACTION W/ INTRAOCULAR LENS  IMPLANT, BILATERAL Bilateral 2009  . CHOLECYSTECTOMY  July 2010   done w/ Whipple procedure  . IR GENERIC HISTORICAL  03/15/2016   IR GUIDED DRAIN W CATHETER PLACEMENT 03/15/2016 Greggory Keen, MD [facility]-INTERV RAD  . SKIN CANCER EXCISION Left    nasal fold  . STAPLING OF BLEBS Left 04/20/2015   Procedure: STAPLING OF BLEBS;  Surgeon:  Ivin Poot, MD;  Location: [place];  Service: Thoracic;  Laterality: Left;  . THYROID SURGERY     "had a growth taken off"  . TONSILLECTOMY  1943  . VAGINAL HYSTERECTOMY  1963   Cervical Cancer  . VIDEO ASSISTED THORACOSCOPY Left 04/20/2015   Procedure: VIDEO ASSISTED THORACOSCOPY;  Surgeon: Ivin Poot, MD;  Location: [place];  Service: Thoracic;  Laterality: Left;  . WHIPPLE PROCEDURE  July 2010   pancreatic adenocarcinoma    Family History  Problem Relation Age of Onset  . Pancreatic cancer Brother   . Colon cancer Sister 86       12/2006 dx surgery and chemotherapy  . Ovarian cancer Sister 12       Ovarian ca,  surgery and chemotherapy    Social History Social History  Substance Use Topics  . Smoking status: Former Smoker    Packs/day: 1.00    Years: 30.00    Types: Cigarettes    Quit date: 02/29/1996  . Smokeless tobacco: Never Used  . Alcohol use No    Current Outpatient Prescriptions  Medication Sig Dispense Refill  . acetaminophen (TYLENOL) 325 MG tablet Take 2 tablets (650 mg total) by mouth every 6 (six) hours as needed for mild pain (or Fever >/= 101).    Marland Kitchen amoxicillin (AMOXIL) 500 MG capsule TAKE ONE CAPSULE BY MOUTH THREE TIMES DAILY WITH FOOD  0  . cholecalciferol (VITAMIN D) 1000 units tablet Take 2,000 Units by mouth daily.    Marland Kitchen  CREON 36000 units CPEP capsule TAKE 1 CAPSULE THREE TIMES A DAY WITH MEALS 270 capsule 1  . diphenoxylate-atropine (LOMOTIL) 2.5-0.025 MG tablet Take 1 tablet by mouth 4 (four) times daily as needed for diarrhea or loose stools. 60 tablet 0  . erlotinib (TARCEVA) 100 MG tablet Take 1 tablet (100 mg total) by mouth daily. Take on an empty stomach 1 hour before meals or 2 hours after 30 tablet 3  . feeding supplement, ENSURE ENLIVE, (ENSURE ENLIVE) LIQD Take 237 mLs by mouth 2 (two) times daily between meals. 237 mL 12  . Gemcitabine HCl (GEMZAR IV) Inject into the vein.    . hydrochlorothiazide (HYDRODIURIL) 25 MG tablet Take 12.5  mg by mouth every morning.     Marland Kitchen ibuprofen (ADVIL,MOTRIN) 400 MG tablet Take 1 tablet (400 mg total) by mouth every 6 (six) hours as needed for mild pain. 30 tablet 0  . lidocaine (XYLOCAINE) 2 % solution Use as directed 20 mLs in the mouth or throat as needed for mouth pain. 100 mL 3  . loperamide (IMODIUM A-D) 2 MG tablet At the onset of diarrhea, take 2 tabs. Then take 1 tab every 2 hours until you have gone 12 hours without having a loose stool. If it is bedtime and you have a loose stool(s) take 2 tabs every 4 hours until morning. Call [facility]. 60 tablet 1  . losartan (COZAAR) 100 MG tablet Take 100 mg by mouth every morning.     . magic mouthwash w/lidocaine SOLN Take 5 mLs by mouth 4 (four) times daily as needed for mouth pain. 240 mL 3  . metoprolol succinate (TOPROL-XL) 50 MG 24 hr tablet Take 25 mg by mouth every morning. Take with or immediately following a meal.     . omeprazole (PRILOSEC) 20 MG capsule Take 20 mg by mouth daily before breakfast.     . ondansetron (ZOFRAN) 8 MG tablet Take 1 tablet (8 mg total) by mouth every 8 (eight) hours as needed for nausea or vomiting. 30 tablet 2  . prochlorperazine (COMPAZINE) 10 MG tablet Take 1 tablet (10 mg total) by mouth every 6 (six) hours as needed for nausea or vomiting. 30 tablet 2  . XARELTO 20 MG TABS tablet Take 1 tablet (20 mg total) by mouth daily with supper. 90 tablet 1   No current facility-administered medications for this visit.     Allergies  Allergen Reactions  . Oxaliplatin Anaphylaxis  . Pantoprazole Other (See Comments)    THIS GIVES THE PATIENT TERRIBLE HEARTBURN (PLEASE DO NOT GIVE)  . Adhesive [Tape] Rash  . Aspirin Other (See Comments)    Burns stomach  . Neulasta [Pegfilgrastim] Other (See Comments)    UNSURE   . Oxycodone Nausea And Vomiting  . Tramadol Nausea And Vomiting    Review of Systems  No shortness of breath Left rib pain probably related to metastatic disease Mild weight loss and  chronic diarrhea  BP 137/61 (BP Location: Right Arm, Patient Position: Sitting, Cuff Size: Large)   Pulse 66   Resp 16   Ht 5\' 4"  (1.626 m)   Wt 112 lb (50.8 kg)   SpO2 98% Comment: ON RA  BMI 19.22 kg/m  Physical Exam      Exam    General- alert and comfortable   Lungs- clear without rales, wheezes   Cor- regular rate and rhythm, no murmur , gallop   Abdomen- soft, non-tender   Extremities - warm, non-tender, minimal edema  Neuro- oriented, appropriate, no focal weakness   Diagnostic Tests:  Chest CT shows emphysema-COPD with bilateral densities related to her metastatic pancreatic cancer Impression: No recurrent pneumothorax No pleural effusion or empyema No thoracic surgical issues at current time  Plan: Return as needed for evaluation or treatment of thoracic surgical problems related to her metastatic cancer and COPD.   Len Childs, MD [facility] [phone]

## 2016-11-16 NOTE — Telephone Encounter (Signed)
Pt received her tarceva today.  She will start taking it tomorrow 11/17/2016.  Pt understands how to take her medication.  She will take it on a empty stomach once a day.  She will take her prilosec in the afternoon at lunch time.  She will call with any problems that she has.

## 2016-11-17 ENCOUNTER — Encounter (HOSPITAL_COMMUNITY): Payer: Medicare Other

## 2016-11-17 ENCOUNTER — Inpatient Hospital Stay (HOSPITAL_COMMUNITY): Payer: Medicare Other

## 2016-11-21 ENCOUNTER — Other Ambulatory Visit (HOSPITAL_COMMUNITY): Payer: Self-pay | Admitting: Emergency Medicine

## 2016-11-21 MED ORDER — ERLOTINIB HCL 100 MG PO TABS: 100.0000 mg | ORAL_TABLET | Freq: Every day | ORAL | 3 refills | Status: DC

## 2016-11-21 NOTE — Progress Notes (Signed)
tarceva refilled.

## 2016-11-22 ENCOUNTER — Encounter (HOSPITAL_COMMUNITY): Payer: Self-pay

## 2016-11-22 ENCOUNTER — Encounter (HOSPITAL_COMMUNITY): Payer: Medicare Other | Attending: Adult Health

## 2016-11-22 VITALS — BP 132/54 | HR 57 | Temp 98.0°F | Resp 18 | Wt 112.6 lb

## 2016-11-22 DIAGNOSIS — C7802 Secondary malignant neoplasm of left lung: Secondary | ICD-10-CM | POA: Diagnosis not present

## 2016-11-22 DIAGNOSIS — C259 Malignant neoplasm of pancreas, unspecified: Secondary | ICD-10-CM | POA: Diagnosis not present

## 2016-11-22 DIAGNOSIS — E876 Hypokalemia: Secondary | ICD-10-CM

## 2016-11-22 DIAGNOSIS — C7800 Secondary malignant neoplasm of unspecified lung: Secondary | ICD-10-CM | POA: Diagnosis not present

## 2016-11-22 DIAGNOSIS — R197 Diarrhea, unspecified: Secondary | ICD-10-CM | POA: Insufficient documentation

## 2016-11-22 DIAGNOSIS — Z95828 Presence of other vascular implants and grafts: Secondary | ICD-10-CM | POA: Insufficient documentation

## 2016-11-22 DIAGNOSIS — Z5111 Encounter for antineoplastic chemotherapy: Secondary | ICD-10-CM | POA: Diagnosis present

## 2016-11-22 LAB — COMPREHENSIVE METABOLIC PANEL
ALK PHOS: 119 U/L (ref 38–126)
ALT: 11 U/L — AB (ref 14–54)
AST: 19 U/L (ref 15–41)
Albumin: 3.2 g/dL — ABNORMAL LOW (ref 3.5–5.0)
Anion gap: 7 (ref 5–15)
BUN: 18 mg/dL (ref 6–20)
CALCIUM: 8.5 mg/dL — AB (ref 8.9–10.3)
CHLORIDE: 109 mmol/L (ref 101–111)
CO2: 21 mmol/L — AB (ref 22–32)
CREATININE: 1.22 mg/dL — AB (ref 0.44–1.00)
GFR calc Af Amer: 48 mL/min — ABNORMAL LOW (ref >60)
GFR calc non Af Amer: 41 mL/min — ABNORMAL LOW (ref >60)
Glucose, Bld: 126 mg/dL — ABNORMAL HIGH (ref 65–99)
Potassium: 3.3 mmol/L — ABNORMAL LOW (ref 3.5–5.1)
SODIUM: 137 mmol/L (ref 135–145)
Total Bilirubin: 0.7 mg/dL (ref 0.3–1.2)
Total Protein: 6.3 g/dL — ABNORMAL LOW (ref 6.5–8.1)

## 2016-11-22 LAB — CBC WITH DIFFERENTIAL/PLATELET
BASOS ABS: 0 10*3/uL (ref 0.0–0.1)
Basophils Relative: 0 %
EOS ABS: 0 10*3/uL (ref 0.0–0.7)
EOS PCT: 1 %
HCT: 32 % — ABNORMAL LOW (ref 36.0–46.0)
HEMOGLOBIN: 10.1 g/dL — AB (ref 12.0–15.0)
LYMPHS ABS: 1.3 10*3/uL (ref 0.7–4.0)
LYMPHS PCT: 17 %
MCH: 31.1 pg (ref 26.0–34.0)
MCHC: 31.6 g/dL (ref 30.0–36.0)
MCV: 98.5 fL (ref 78.0–100.0)
Monocytes Absolute: 0.8 10*3/uL (ref 0.1–1.0)
Monocytes Relative: 10 %
NEUTROS PCT: 72 %
Neutro Abs: 5.5 10*3/uL (ref 1.7–7.7)
PLATELETS: 140 10*3/uL — AB (ref 150–400)
RBC: 3.25 MIL/uL — AB (ref 3.87–5.11)
RDW: 17.5 % — ABNORMAL HIGH (ref 11.5–15.5)
WBC: 7.5 10*3/uL (ref 4.0–10.5)

## 2016-11-22 MED ORDER — POTASSIUM CHLORIDE CRYS ER 20 MEQ PO TBCR
EXTENDED_RELEASE_TABLET | ORAL | Status: AC
  Filled 2016-11-22: qty 2

## 2016-11-22 MED ORDER — HEPARIN SOD (PORK) LOCK FLUSH 100 UNIT/ML IV SOLN
500.0000 [IU] | Freq: Once | INTRAVENOUS | Status: AC | PRN
  Administered 2016-11-22: 500 [IU]

## 2016-11-22 MED ORDER — PROCHLORPERAZINE MALEATE 10 MG PO TABS
ORAL_TABLET | ORAL | Status: AC
  Filled 2016-11-22: qty 1

## 2016-11-22 MED ORDER — SODIUM CHLORIDE 0.9 % IV SOLN
Freq: Once | INTRAVENOUS | Status: AC
  Administered 2016-11-22: 12:00:00 via INTRAVENOUS

## 2016-11-22 MED ORDER — PROCHLORPERAZINE MALEATE 10 MG PO TABS
10.0000 mg | ORAL_TABLET | Freq: Once | ORAL | Status: AC
  Administered 2016-11-22: 10 mg via ORAL

## 2016-11-22 MED ORDER — POTASSIUM CHLORIDE CRYS ER 20 MEQ PO TBCR
40.0000 meq | EXTENDED_RELEASE_TABLET | Freq: Once | ORAL | Status: AC
  Administered 2016-11-22: 40 meq via ORAL

## 2016-11-22 MED ORDER — SODIUM CHLORIDE 0.9 % IV SOLN
1000.0000 mg/m2 | Freq: Once | INTRAVENOUS | Status: AC
  Administered 2016-11-22: 1520 mg via INTRAVENOUS
  Filled 2016-11-22: qty 24.2

## 2016-11-22 NOTE — Progress Notes (Signed)
Tolerated tx w/o adverse reaction.  Alert, in no distress.  VSS.  Discharged ambulatory in c/o sister.

## 2016-11-23 ENCOUNTER — Encounter: Payer: Self-pay | Admitting: Radiation Oncology

## 2016-11-23 LAB — CANCER ANTIGEN 19-9: CAN 19-9: 1203 U/mL — AB (ref 0–35)

## 2016-11-29 ENCOUNTER — Encounter (HOSPITAL_COMMUNITY): Payer: Self-pay

## 2016-11-29 ENCOUNTER — Encounter (HOSPITAL_BASED_OUTPATIENT_CLINIC_OR_DEPARTMENT_OTHER): Payer: Medicare Other

## 2016-11-29 VITALS — BP 146/52 | HR 71 | Temp 98.5°F | Resp 20 | Wt 113.2 lb

## 2016-11-29 DIAGNOSIS — Z452 Encounter for adjustment and management of vascular access device: Secondary | ICD-10-CM | POA: Diagnosis present

## 2016-11-29 DIAGNOSIS — C259 Malignant neoplasm of pancreas, unspecified: Secondary | ICD-10-CM

## 2016-11-29 DIAGNOSIS — C7800 Secondary malignant neoplasm of unspecified lung: Secondary | ICD-10-CM | POA: Diagnosis not present

## 2016-11-29 DIAGNOSIS — Z95828 Presence of other vascular implants and grafts: Secondary | ICD-10-CM | POA: Diagnosis not present

## 2016-11-29 DIAGNOSIS — R197 Diarrhea, unspecified: Secondary | ICD-10-CM | POA: Diagnosis not present

## 2016-11-29 LAB — CBC WITH DIFFERENTIAL/PLATELET
Basophils Absolute: 0 10*3/uL (ref 0.0–0.1)
Basophils Relative: 0 %
Eosinophils Absolute: 0 10*3/uL (ref 0.0–0.7)
Eosinophils Relative: 0 %
HEMATOCRIT: 31.1 % — AB (ref 36.0–46.0)
HEMOGLOBIN: 9.8 g/dL — AB (ref 12.0–15.0)
LYMPHS ABS: 0.8 10*3/uL (ref 0.7–4.0)
LYMPHS PCT: 14 %
MCH: 31.4 pg (ref 26.0–34.0)
MCHC: 31.5 g/dL (ref 30.0–36.0)
MCV: 99.7 fL (ref 78.0–100.0)
MONOS PCT: 14 %
Monocytes Absolute: 0.8 10*3/uL (ref 0.1–1.0)
NEUTROS ABS: 4.1 10*3/uL (ref 1.7–7.7)
NEUTROS PCT: 72 %
Platelets: 65 10*3/uL — ABNORMAL LOW (ref 150–400)
RBC: 3.12 MIL/uL — ABNORMAL LOW (ref 3.87–5.11)
RDW: 16.7 % — ABNORMAL HIGH (ref 11.5–15.5)
WBC: 5.7 10*3/uL (ref 4.0–10.5)

## 2016-11-29 LAB — COMPREHENSIVE METABOLIC PANEL
ALBUMIN: 3.2 g/dL — AB (ref 3.5–5.0)
ALK PHOS: 127 U/L — AB (ref 38–126)
ALT: 26 U/L (ref 14–54)
ANION GAP: 8 (ref 5–15)
AST: 50 U/L — ABNORMAL HIGH (ref 15–41)
BILIRUBIN TOTAL: 0.7 mg/dL (ref 0.3–1.2)
BUN: 19 mg/dL (ref 6–20)
CALCIUM: 8.8 mg/dL — AB (ref 8.9–10.3)
CO2: 19 mmol/L — ABNORMAL LOW (ref 22–32)
CREATININE: 1.11 mg/dL — AB (ref 0.44–1.00)
Chloride: 109 mmol/L (ref 101–111)
GFR calc non Af Amer: 46 mL/min — ABNORMAL LOW (ref >60)
GFR, EST AFRICAN AMERICAN: 54 mL/min — AB (ref >60)
GLUCOSE: 95 mg/dL (ref 65–99)
Potassium: 3.6 mmol/L (ref 3.5–5.1)
Sodium: 136 mmol/L (ref 135–145)
TOTAL PROTEIN: 6.6 g/dL (ref 6.5–8.1)

## 2016-11-29 MED ORDER — SODIUM CHLORIDE 0.9% FLUSH
10.0000 mL | INTRAVENOUS | Status: DC | PRN
  Administered 2016-11-29: 10 mL via INTRAVENOUS
  Filled 2016-11-29: qty 10

## 2016-11-29 MED ORDER — HEPARIN SOD (PORK) LOCK FLUSH 100 UNIT/ML IV SOLN
500.0000 [IU] | Freq: Once | INTRAVENOUS | Status: AC
  Administered 2016-11-29: 500 [IU] via INTRAVENOUS

## 2016-11-29 MED ORDER — HEPARIN SOD (PORK) LOCK FLUSH 100 UNIT/ML IV SOLN
INTRAVENOUS | Status: AC
  Filled 2016-11-29: qty 5

## 2016-11-29 NOTE — Progress Notes (Signed)
Labs reviewed with Dr. Talbert Cage - will defer tx x 1 week per MD d/t platelet count of 65,000.  Pt informed of lab work and plan of care - verbalizes understanding.

## 2016-12-02 ENCOUNTER — Encounter: Payer: Self-pay | Admitting: Radiation Oncology

## 2016-12-02 ENCOUNTER — Telehealth (HOSPITAL_COMMUNITY): Payer: Self-pay | Admitting: *Deleted

## 2016-12-02 NOTE — Progress Notes (Signed)
Histology and Location of Primary Cancer: Pancreatic cancer metastasized to lung. History of cervical and skin cancer.  Sites of Visceral and Bony Metastatic Disease: enlarging bilateral pancreatic metastases and rib met  Location(s) of Symptomatic Metastases: left fifth rib met  Past/Anticipated chemotherapy by medical oncology, if any:   11/13/2008 - 09/12/2009 Chemotherapy    Adjuvant Gemcitabine X 6 months    03/15/2012 - 06/13/2012 Chemotherapy    5-FU based chemotherapy   02/13/2013 - 05/20/2013 Chemotherapy     Gemzar Abraxane for metastatic disease (given to cover [facility] primaries at [facility]   07/30/2014 - 09/10/2014 Chemotherapy     Gemzar abraxane stopped due to poor tolerance     10/19/2015 - 03/01/2016 Chemotherapy    The patient had palonosetron (ALOXI) injection 0.25 mg, 0.25 mg, Intravenous,  Once, 10 of 12 cycles  leucovorin 652 mg in dextrose 5 % 250 mL infusion, 400 mg/m2 = 652 mg, Intravenous,  Once, 10 of 12 cycles  fluorouracil (ADRUCIL) 2,950 mg in sodium chloride 0.9 % 91 mL chemo infusion, 1,800 mg/m2 = 2,950 mg (100 % of original dose 1,800 mg/m2), Intravenous, 1 Day/Dose, 10 of 12 cycles Dose modification: 1,920 mg/m2 (original dose 1,800 mg/m2, Cycle 1, Reason: Provider Judgment), 1,800 mg/m2 (original dose 1,800 mg/m2, Cycle 1, Reason: Provider Judgment), 1,500 mg/m2 (original dose 1,800 mg/m2, Cycle 2, Reason: Dose not tolerated)  irinotecan LIPOSOME (ONIVYDE) 55.9 mg in sodium chloride 0.9 % 500 mL chemo infusion, 35 mg/m2 = 55.9 mg (100 % of original dose 35 mg/m2), Intravenous, Once, 10 of 12 cycles Dose modification: 35 mg/m2 (original dose 35 mg/m2, Cycle 1, Reason: Provider Judgment)  for chemotherapy treatment.     10/19/2015 - 03/01/2016 Chemotherapy    The patient had palonosetron (ALOXI) injection 0.25 mg, 0.25 mg, Intravenous,  Once, 10 of 12 cycles  leucovorin 652 mg in dextrose 5 % 250 mL infusion, 400 mg/m2 = 652 mg, Intravenous,   Once, 10 of 12 cycles  fluorouracil (ADRUCIL) 2,950 mg in sodium chloride 0.9 % 91 mL chemo infusion, 1,800 mg/m2 = 2,950 mg (100 % of original dose 1,800 mg/m2), Intravenous, 1 Day/Dose, 10 of 12 cycles Dose modification: 1,920 mg/m2 (original dose 1,800 mg/m2, Cycle 1, Reason: Provider Judgment), 1,800 mg/m2 (original dose 1,800 mg/m2, Cycle 1, Reason: Provider Judgment), 1,500 mg/m2 (original dose 1,800 mg/m2, Cycle 2, Reason: Dose not tolerated)  irinotecan LIPOSOME (ONIVYDE) 55.9 mg in sodium chloride 0.9 % 500 mL chemo infusion, 35 mg/m2 = 55.9 mg (100 % of original dose 35 mg/m2), Intravenous, Once, 10 of 12 cycles Dose modification: 35 mg/m2 (original dose 35 mg/m2, Cycle 1, Reason: Provider Judgment)  for chemotherapy treatment.    Pain on a scale of 0-10 is: Reports left fifth rib pain stopped with Tarceva. Reports new onset low back pain below waist line x 2-3 days that radiates to left hip when she rises from a sitting position   If Spine Met(s), symptoms, if any, include:  Bowel/Bladder retention or incontinence (please describe): Chronic diarrhea from pancreatic insufficiency (managing with Imodium AD, Lomotil and Creon)  Numbness or weakness in extremities (please describe): numbness in finger tips  Current Decadron regimen, if applicable: no  Ambulatory status? Walker? Wheelchair?: Ambulatory  SAFETY ISSUES:  Prior radiation? SBRT LUL 18 Gy by Dr. Tammi Klippel  Pacemaker/ICD? no  Possible current pregnancy? no  Is the patient on methotrexate? no  Current Complaints / other details:  76 year old female. Mild weight loss. Former smoker quit in 1998.

## 2016-12-02 NOTE — Telephone Encounter (Signed)
Patient called stating that she has had severe left lower back pain that radiates into her left waist and down her left leg.  She took an Aleve and called to make sure that was okay with her new chemotherapy pill.  I advised patient that it was okay to take Aleve and for her to take it twice daily as per the instructions on the bottle.  I also told patient to use pillows between legs and try to lay down more than a sitting position to relieve some stress off her nerves and muscles.  Patient verbalizes understanding and states that if the pain gets any worse that she may go to the emergency room.  I advised patient that it would be the best plan of action if non-medication therapies did not help her pain.

## 2016-12-05 ENCOUNTER — Encounter: Payer: Self-pay | Admitting: Radiation Oncology

## 2016-12-05 ENCOUNTER — Ambulatory Visit
Admission: RE | Admit: 2016-12-05 | Discharge: 2016-12-05 | Disposition: A | Discharge status: Home / Self Care | Payer: Medicare Other | Source: Ambulatory Visit | Attending: Radiation Oncology | Admitting: Radiation Oncology

## 2016-12-05 VITALS — BP 135/62 | HR 59 | Temp 97.7°F | Resp 16 | Ht 60.0 in | Wt 113.4 lb

## 2016-12-05 DIAGNOSIS — C7951 Secondary malignant neoplasm of bone: Secondary | ICD-10-CM

## 2016-12-05 DIAGNOSIS — J449 Chronic obstructive pulmonary disease, unspecified: Secondary | ICD-10-CM | POA: Insufficient documentation

## 2016-12-05 DIAGNOSIS — Z79899 Other long term (current) drug therapy: Secondary | ICD-10-CM | POA: Diagnosis not present

## 2016-12-05 DIAGNOSIS — Z923 Personal history of irradiation: Secondary | ICD-10-CM | POA: Diagnosis not present

## 2016-12-05 DIAGNOSIS — C7800 Secondary malignant neoplasm of unspecified lung: Secondary | ICD-10-CM | POA: Diagnosis not present

## 2016-12-05 DIAGNOSIS — Z9889 Other specified postprocedural states: Secondary | ICD-10-CM | POA: Diagnosis not present

## 2016-12-05 DIAGNOSIS — Z9049 Acquired absence of other specified parts of digestive tract: Secondary | ICD-10-CM | POA: Insufficient documentation

## 2016-12-05 DIAGNOSIS — I10 Essential (primary) hypertension: Secondary | ICD-10-CM | POA: Diagnosis not present

## 2016-12-05 DIAGNOSIS — C259 Malignant neoplasm of pancreas, unspecified: Secondary | ICD-10-CM | POA: Diagnosis not present

## 2016-12-05 DIAGNOSIS — C7952 Secondary malignant neoplasm of bone marrow: Principal | ICD-10-CM

## 2016-12-05 DIAGNOSIS — Z8541 Personal history of malignant neoplasm of cervix uteri: Secondary | ICD-10-CM | POA: Insufficient documentation

## 2016-12-05 DIAGNOSIS — Z85118 Personal history of other malignant neoplasm of bronchus and lung: Secondary | ICD-10-CM | POA: Diagnosis not present

## 2016-12-05 DIAGNOSIS — Z87891 Personal history of nicotine dependence: Secondary | ICD-10-CM | POA: Diagnosis not present

## 2016-12-05 DIAGNOSIS — Z9221 Personal history of antineoplastic chemotherapy: Secondary | ICD-10-CM | POA: Insufficient documentation

## 2016-12-05 DIAGNOSIS — Z809 Family history of malignant neoplasm, unspecified: Secondary | ICD-10-CM

## 2016-12-05 DIAGNOSIS — Z8041 Family history of malignant neoplasm of ovary: Secondary | ICD-10-CM | POA: Insufficient documentation

## 2016-12-05 DIAGNOSIS — Z808 Family history of malignant neoplasm of other organs or systems: Secondary | ICD-10-CM | POA: Diagnosis not present

## 2016-12-05 NOTE — Progress Notes (Signed)
See progress note under physician encounter. 

## 2016-12-05 NOTE — Progress Notes (Addendum)
Radiation Oncology         [phone] ________________________________  Initial Outpatient Consultation  Name: [name] [name] MRN: [id number]  Date of Service: 12/05/2016 DOB: 02/13/1941  KN:[id number], Weldon Picking, MD  Deneise Lever, MD   REFERRING PHYSICIAN: Dahlia Byes, MD  DIAGNOSIS: 76 yo woman with metastatic pancreatic cancer and expansile left 5th rib metastasis    ICD-10-CM   1. Secondary malignant neoplasm of bone and bone marrow (HCC) C79.51    C79.52   2. Pancreatic cancer metastasized to lung (HCC) C25.9    C78.00   3. Family history of cancer Z80.9   4. Left 5th rib metastasis (HCC) C79.51     HISTORY OF PRESENT ILLNESS: [name] [name] is a 76 y.o. female seen at the request of Dr. Prescott Gum for metastatic pancreatic cancer. The patient was originally diagnosed with her pancreatic cancer in 2010 and underwent Whipple procedure at [facility]. She was found to have progressive disease in 2012 and underwent LLL wedge lobectomy with Dr. Arlyce Dice confirming metastatic disease. She has been on systemic therapy since and also underwent SBRT with Dr. Tammi Klippel in 2013 to the left upper lobe. She had progression of another lesion in the left upper lobe and underwent repeat wedge resection in 2017 with Dr. Prescott Gum. She continues on systemic therapy and had a recent CT of the chest, abdomen,a nd pelvis that revealed  mild progression of pulmonary metastasis and slight enlargement of a left fifth rib metastasis seen on 11/10/2016 CT. She had been started on Tarceva and Gemzar and continues this chemotherapy under the care of Dr. Talbert Cage. She spoke with Dr. Prescott Gum about pain she had been having in the left rib area a few weeks ago and comes today to discuss if there is an option for radiotherapy to this site.    PREVIOUS RADIATION THERAPY: Yes:  2013:  54 Gy SBRT in 3 fractions to the left upper lobe lung with Dr. Tammi Klippel  PAST MEDICAL HISTORY:  Past Medical History:  Diagnosis Date  .  Abdominal wall hernia   . Anemia   . Anemia due to antineoplastic chemotherapy 07/05/2016  . Aortic aneurysm (HCC)    small aortic aneurysm  . Arthritis    "little in my right hand" (03/10/2016)  . Cervical cancer ([place]) 1963  . COPD (chronic obstructive pulmonary disease) ([place])   . Emphysema   . GERD (gastroesophageal reflux disease)   . Goals of care, counseling/discussion 02/02/2016  . Hypercholesterolemia   . Hypertension   . Low serum vitamin B12 01/20/2016  . Pancreatic adenocarcinoma [facility]) July 2010  . Pancreatic cancer metastasized to lung [facility]) 09/04/2015      PAST SURGICAL HISTORY: Past Surgical History:  Procedure Laterality Date  . BREAST CYST EXCISION Right 1988  . BUNIONECTOMY Bilateral 2007  . CARDIAC CATHETERIZATION    . CATARACT EXTRACTION W/ INTRAOCULAR LENS  IMPLANT, BILATERAL Bilateral 2009  . CHOLECYSTECTOMY  July 2010   done w/ Whipple procedure  . IR GENERIC HISTORICAL  03/15/2016   IR GUIDED DRAIN W CATHETER PLACEMENT 03/15/2016 Greggory Keen, MD [facility]-INTERV RAD  . SKIN CANCER EXCISION Left    nasal fold  . STAPLING OF BLEBS Left 04/20/2015   Procedure: STAPLING OF BLEBS;  Surgeon: Ivin Poot, MD;  Location: [place];  Service: Thoracic;  Laterality: Left;  . THYROID SURGERY     "had a growth taken off"  . TONSILLECTOMY  1943  . VAGINAL HYSTERECTOMY  1963   Cervical  Cancer  . VIDEO ASSISTED THORACOSCOPY Left 04/20/2015   Procedure: VIDEO ASSISTED THORACOSCOPY;  Surgeon: Ivin Poot, MD;  Location: [place];  Service: Thoracic;  Laterality: Left;  . WHIPPLE PROCEDURE  July 2010   pancreatic adenocarcinoma    FAMILY HISTORY:  Family History  Problem Relation Age of Onset  . Pancreatic cancer Brother   . Colon cancer Sister 45       12/2006 dx surgery and chemotherapy  . Ovarian cancer Sister 54       Ovarian ca,  surgery and chemotherapy  . Ovarian cancer Other   . Cancer Other        lung to brain    SOCIAL HISTORY:  Social History   Social  History  . Marital status: Widowed    Spouse name: N/A  . Number of children: 0  . Years of education: N/A   Occupational History  .  Retired   Social History Main Topics  . Smoking status: Former Smoker    Packs/day: 1.00    Years: 30.00    Types: Cigarettes    Quit date: 02/29/1996  . Smokeless tobacco: Never Used  . Alcohol use No  . Drug use: No  . Sexual activity: No   Other Topics Concern  . Not on file   Social History Narrative  . No narrative on file  The patient lives in [place], she is accompanied by her sister.  ALLERGIES: Oxaliplatin; Pantoprazole; Adhesive [tape]; Aspirin; Neulasta [pegfilgrastim]; Oxycodone; and Tramadol  MEDICATIONS:  Current Outpatient Prescriptions  Medication Sig Dispense Refill  . cholecalciferol (VITAMIN D) 1000 units tablet Take 2,000 Units by mouth daily.    Marland Kitchen CREON 36000 units CPEP capsule TAKE 1 CAPSULE THREE TIMES A DAY WITH MEALS 270 capsule 1  . diphenoxylate-atropine (LOMOTIL) 2.5-0.025 MG tablet Take 1 tablet by mouth 4 (four) times daily as needed for diarrhea or loose stools. 60 tablet 0  . erlotinib (TARCEVA) 100 MG tablet Take 1 tablet (100 mg total) by mouth daily. Take on an empty stomach 1 hour before meals or 2 hours after 30 tablet 3  . feeding supplement, ENSURE ENLIVE, (ENSURE ENLIVE) LIQD Take 237 mLs by mouth 2 (two) times daily between meals. 237 mL 12  . hydrochlorothiazide (HYDRODIURIL) 25 MG tablet Take 12.5 mg by mouth every morning.     . loperamide (IMODIUM A-D) 2 MG tablet At the onset of diarrhea, take 2 tabs. Then take 1 tab every 2 hours until you have gone 12 hours without having a loose stool. If it is bedtime and you have a loose stool(s) take 2 tabs every 4 hours until morning. Call [place]. 60 tablet 1  . losartan (COZAAR) 100 MG tablet Take 100 mg by mouth every morning.     . metoprolol succinate (TOPROL-XL) 50 MG 24 hr tablet Take 25 mg by mouth every morning. Take with or immediately following a  meal.     . omeprazole (PRILOSEC) 20 MG capsule Take 20 mg by mouth daily before breakfast.     . XARELTO 20 MG TABS tablet Take 1 tablet (20 mg total) by mouth daily with supper. 90 tablet 1  . acetaminophen (TYLENOL) 325 MG tablet Take 2 tablets (650 mg total) by mouth every 6 (six) hours as needed for mild pain (or Fever >/= 101). (Patient not taking: Reported on 12/05/2016)    . amoxicillin (AMOXIL) 500 MG capsule TAKE ONE CAPSULE BY MOUTH THREE TIMES DAILY WITH FOOD  0  .  Gemcitabine HCl (GEMZAR IV) Inject into the vein.    Marland Kitchen ibuprofen (ADVIL,MOTRIN) 400 MG tablet Take 1 tablet (400 mg total) by mouth every 6 (six) hours as needed for mild pain. (Patient not taking: Reported on 12/05/2016) 30 tablet 0  . lidocaine (XYLOCAINE) 2 % solution Use as directed 20 mLs in the mouth or throat as needed for mouth pain. (Patient not taking: Reported on 12/05/2016) 100 mL 3  . magic mouthwash w/lidocaine SOLN Take 5 mLs by mouth 4 (four) times daily as needed for mouth pain. (Patient not taking: Reported on 12/05/2016) 240 mL 3  . ondansetron (ZOFRAN) 8 MG tablet Take 1 tablet (8 mg total) by mouth every 8 (eight) hours as needed for nausea or vomiting. (Patient not taking: Reported on 12/05/2016) 30 tablet 2  . prochlorperazine (COMPAZINE) 10 MG tablet Take 1 tablet (10 mg total) by mouth every 6 (six) hours as needed for nausea or vomiting. (Patient not taking: Reported on 12/05/2016) 30 tablet 2   No current facility-administered medications for this encounter.     REVIEW OF SYSTEMS:  On review of systems, the patient reports that she is doing well overall. She denies any chest pain, shortness of breath, cough, fevers, chills, or night sweats. She has had mild unintended weight loss. She denies any bladder disturbances, and denies abdominal pain, nausea or vomiting. She reports left rib pain and low back pain that radiates to the left hip. She reports left fifth rib pain that stopped with Tarceva, and new  onset low back pain below the waist line for the past 2-3 days. She says this low back pain radiates to her left hip when she rises from a sitting position. She has also had chronic diarrhea from pancreatic insufficiency, managed with imodium AD, Lomotil, and Creon. She also reports numbness in her finger tips.  She has numbness in her finger tips. A complete review of systems is obtained and is otherwise negative.    PHYSICAL EXAM:  Wt Readings from Last 3 Encounters:  12/05/16 113 lb 6.4 oz (51.4 kg)  11/29/16 113 lb 3.2 oz (51.3 kg)  11/22/16 112 lb 9.6 oz (51.1 kg)   Temp Readings from Last 3 Encounters:  12/05/16 97.7 F (36.5 C) (Oral)  11/29/16 98.5 F (36.9 C) (Oral)  11/22/16 98 F (36.7 C) (Oral)   BP Readings from Last 3 Encounters:  12/05/16 135/62  11/29/16 (!) 146/52  11/22/16 (!) 132/54   Pulse Readings from Last 3 Encounters:  12/05/16 (!) 59  11/29/16 71  11/22/16 (!) 57   Pain Assessment Pain Score: 3  (low back and left hip)/10  In general this is a well appearing Caucasian woman in no acute distress. She is alert and oriented x4 and appropriate throughout the examination. HEENT reveals that the patient is normocephalic, atraumatic. Cardiovascular exam reveals a regular rate and rhythm, no clicks rubs or murmurs are auscultated. Chest is clear to auscultation bilaterally. She has a large reducible abdominal wall hernia at the site of her previous Whipple incision. Visible incision along the left chest wall laterally about level IV rib without palpable anomaly. No palpable mass is noted otherwise in the cervical, supraclavicular, or axillary nodes on the left.   KPS = 90  100 - Normal; no complaints; no evidence of disease. 90   - Able to carry on normal activity; minor signs or symptoms of disease. 80   - Normal activity with effort; some signs or symptoms of disease. 70   -  Cares for self; unable to carry on normal activity or to do active work. 60   -  Requires occasional assistance, but is able to care for most of his personal needs. 50   - Requires considerable assistance and frequent medical care. 82   - Disabled; requires special care and assistance. 41   - Severely disabled; hospital admission is indicated although death not imminent. 25   - Very sick; hospital admission necessary; active supportive treatment necessary. 10   - Moribund; fatal processes progressing rapidly. 0     - Dead  Karnofsky DA, Abelmann Pitt, Craver LS and Burchenal [facility] [phone]) The use of the nitrogen mustards in the palliative treatment of carcinoma: with particular reference to bronchogenic carcinoma Cancer 1 634-56  LABORATORY DATA:  Lab Results  Component Value Date   WBC 5.7 11/29/2016   HGB 9.8 (L) 11/29/2016   HCT 31.1 (L) 11/29/2016   MCV 99.7 11/29/2016   PLT 65 (L) 11/29/2016   Lab Results  Component Value Date   NA 136 11/29/2016   K 3.6 11/29/2016   CL 109 11/29/2016   CO2 19 (L) 11/29/2016   Lab Results  Component Value Date   ALT 26 11/29/2016   AST 50 (H) 11/29/2016   ALKPHOS 127 (H) 11/29/2016   BILITOT 0.7 11/29/2016     RADIOGRAPHY: Ct Chest W Contrast  Result Date: 11/11/2016 CLINICAL DATA:  Pancreatic cancer with metastasis to lung. Whipple procedure in 2010. Status post radiation therapy. Ongoing chemotherapy. Status post lobectomy. Hysterectomy. Remote history of cervical cancer. EXAM: CT CHEST, ABDOMEN, AND PELVIS WITH CONTRAST TECHNIQUE: Multidetector CT imaging of the chest, abdomen and pelvis was performed following the standard protocol during bolus administration of intravenous contrast. CONTRAST:  26mL ISOVUE-300 IOPAMIDOL (ISOVUE-300) INJECTION 61% COMPARISON:  07/26/2016 FINDINGS: CT CHEST FINDINGS Cardiovascular: A right Port-A-Cath which terminates at the low right atrium. Advanced aortic and branch vessel atherosclerosis. Tortuous thoracic aorta. Mild cardiomegaly, without pericardial effusion. Multivessel coronary artery  atherosclerosis. No central pulmonary embolism, on this non-dedicated study. Mediastinum/Nodes: No supraclavicular adenopathy. No mediastinal or hilar adenopathy. Lungs/Pleura: Minimal right-sided pleural fluid or thickening, similar. Left-sided pleural foci of increased density could represent calcification or the sequelae of prior pleurodesis. Advanced bullous type emphysema. Posterior right upper lobe scarring. Subpleural right upper lobe pulmonary nodule is similar at 5 mm on image 43/series 4. Partially cavitary right lower lobe pulmonary nodule measures 2.0 cm on image 128/series 4 versus 1.2 cm on the prior (when remeasured). More posterior and cephalad right lower lobe partially cavitary nodule on image 122/series 4 is also progressive. Right lower lobe lung mass with partial cavitation. 6.1 x 3.7 cm today versus 5.8 x 3.6 cm on the prior. Subpleural possibly radiation induced scarring in the left upper lobe. Left lower lobe consolidation with bronchiectasis is likely due to scarring. Subpleural left lower lobe density measures 11 mm on image 109/ series 2. Compare 6 mm on the prior. Musculoskeletal: Osteopenia. 5th left rib metastasis with soft tissue component. Combination of osseous and soft tissue density measures 3.6 cm on image 39/series 2 versus 3.2 cm at the same level on the prior (when remeasured). CT ABDOMEN PELVIS FINDINGS Hepatobiliary: Suspect mild cirrhosis. No focal liver lesion. Pneumobilia. Cholecystectomy. Pancreas: Whipple procedure. Air within the pancreatic duct. Atrophy throughout the remaining pancreas with mild duct dilatation, similar. No acute pancreatitis. Spleen: Normal in size, without focal abnormality. Adrenals/Urinary Tract: Right adrenal nodularity is similar 1.6 cm. Left adrenal thickening is not significantly changed.  Mild renal cortical thinning bilaterally. Lower pole left renal cyst or minimally complex cyst. No hydronephrosis. Normal urinary bladder. Stomach/Bowel:  Normal stomach, without wall thickening. Normal colon and terminal ileum. Normal small bowel caliber. Again identified is a small bowel containing ventral abdominal wall hernia. Vascular/Lymphatic: Advanced aortic and branch vessel atherosclerosis. Infrarenal abdominal aortic ectasia including at 3.6 cm. Patent portal vein and splenic vein. Soft tissue thickening adjacent the origin of the SMA, including on image 64/series 2. Grossly similar. No pelvic sidewall adenopathy. Reproductive: Hysterectomy.  No adnexal mass. Other: No significant free fluid. Moderate pelvic floor laxity. No evidence of omental or peritoneal disease. Musculoskeletal: Lipoma within the right by musculature is unchanged including at 5.0 cm. Osteopenia. IMPRESSION: CT CHEST IMPRESSION 1. Mild progression of pulmonary metastasis. 2. Slight enlargement of a  left fifth rib metastasis. 3. No thoracic adenopathy. 4.  Emphysema (ICD10-J43.9). 5. Coronary artery atherosclerosis. Aortic Atherosclerosis (ICD10-I70.0). CT ABDOMEN AND PELVIS IMPRESSION 1. Status post Whipple procedure. No evidence of progressive disease within the abdomen. 2. Grossly similar soft tissue thickening about the origin of the superior mesenteric artery. 3. Probable cirrhosis. 4. Similar ventral abdominal wall hernia containing nonobstructive small bowel. 5. Similar infrarenal aortic ectasia. Electronically Signed   By: Abigail Miyamoto M.D.   On: 11/11/2016 08:47   Ct Abdomen Pelvis W Contrast  Result Date: 11/11/2016 CLINICAL DATA:  Pancreatic cancer with metastasis to lung. Whipple procedure in 2010. Status post radiation therapy. Ongoing chemotherapy. Status post lobectomy. Hysterectomy. Remote history of cervical cancer. EXAM: CT CHEST, ABDOMEN, AND PELVIS WITH CONTRAST TECHNIQUE: Multidetector CT imaging of the chest, abdomen and pelvis was performed following the standard protocol during bolus administration of intravenous contrast. CONTRAST:  60mL ISOVUE-300 IOPAMIDOL  (ISOVUE-300) INJECTION 61% COMPARISON:  07/26/2016 FINDINGS: CT CHEST FINDINGS Cardiovascular: A right Port-A-Cath which terminates at the low right atrium. Advanced aortic and branch vessel atherosclerosis. Tortuous thoracic aorta. Mild cardiomegaly, without pericardial effusion. Multivessel coronary artery atherosclerosis. No central pulmonary embolism, on this non-dedicated study. Mediastinum/Nodes: No supraclavicular adenopathy. No mediastinal or hilar adenopathy. Lungs/Pleura: Minimal right-sided pleural fluid or thickening, similar. Left-sided pleural foci of increased density could represent calcification or the sequelae of prior pleurodesis. Advanced bullous type emphysema. Posterior right upper lobe scarring. Subpleural right upper lobe pulmonary nodule is similar at 5 mm on image 43/series 4. Partially cavitary right lower lobe pulmonary nodule measures 2.0 cm on image 128/series 4 versus 1.2 cm on the prior (when remeasured). More posterior and cephalad right lower lobe partially cavitary nodule on image 122/series 4 is also progressive. Right lower lobe lung mass with partial cavitation. 6.1 x 3.7 cm today versus 5.8 x 3.6 cm on the prior. Subpleural possibly radiation induced scarring in the left upper lobe. Left lower lobe consolidation with bronchiectasis is likely due to scarring. Subpleural left lower lobe density measures 11 mm on image 109/ series 2. Compare 6 mm on the prior. Musculoskeletal: Osteopenia. 5th left rib metastasis with soft tissue component. Combination of osseous and soft tissue density measures 3.6 cm on image 39/series 2 versus 3.2 cm at the same level on the prior (when remeasured). CT ABDOMEN PELVIS FINDINGS Hepatobiliary: Suspect mild cirrhosis. No focal liver lesion. Pneumobilia. Cholecystectomy. Pancreas: Whipple procedure. Air within the pancreatic duct. Atrophy throughout the remaining pancreas with mild duct dilatation, similar. No acute pancreatitis. Spleen: Normal in  size, without focal abnormality. Adrenals/Urinary Tract: Right adrenal nodularity is similar 1.6 cm. Left adrenal thickening is not significantly changed. Mild renal cortical thinning bilaterally.  Lower pole left renal cyst or minimally complex cyst. No hydronephrosis. Normal urinary bladder. Stomach/Bowel: Normal stomach, without wall thickening. Normal colon and terminal ileum. Normal small bowel caliber. Again identified is a small bowel containing ventral abdominal wall hernia. Vascular/Lymphatic: Advanced aortic and branch vessel atherosclerosis. Infrarenal abdominal aortic ectasia including at 3.6 cm. Patent portal vein and splenic vein. Soft tissue thickening adjacent the origin of the SMA, including on image 64/series 2. Grossly similar. No pelvic sidewall adenopathy. Reproductive: Hysterectomy.  No adnexal mass. Other: No significant free fluid. Moderate pelvic floor laxity. No evidence of omental or peritoneal disease. Musculoskeletal: Lipoma within the right by musculature is unchanged including at 5.0 cm. Osteopenia. IMPRESSION: CT CHEST IMPRESSION 1. Mild progression of pulmonary metastasis. 2. Slight enlargement of a  left fifth rib metastasis. 3. No thoracic adenopathy. 4.  Emphysema (ICD10-J43.9). 5. Coronary artery atherosclerosis. Aortic Atherosclerosis (ICD10-I70.0). CT ABDOMEN AND PELVIS IMPRESSION 1. Status post Whipple procedure. No evidence of progressive disease within the abdomen. 2. Grossly similar soft tissue thickening about the origin of the superior mesenteric artery. 3. Probable cirrhosis. 4. Similar ventral abdominal wall hernia containing nonobstructive small bowel. 5. Similar infrarenal aortic ectasia. Electronically Signed   By: Abigail Miyamoto M.D.   On: 11/11/2016 08:47      IMPRESSION/PLAN: 1. 76 y.o. woman with metastatic pancreatic cancer with disease in the left 5th rib. Today, we reviewed the patient's CT images and her symptoms. At this point, with improvement over the  past week, she is asymptomatic, we would not recommend treatment to the left rib. We discussed the risks associated with treatment if she's not symptomatic as treatment toxicities could cause her to be symptomatic. If she developed recurrent or progressive pain, the risk benefit ratio may indicate a role for treatment which would could reconsider treatment. She is counseled on the options of radiotherapy if these occur. We discussed the risks, benefits, short, and long term effects of radiotherapy, and the patient will keep [place] informed if her symptoms become problematic. 2. Possible genetic predisposition to malignancy. Given the patient's family and personal history of cancer, she would be a candidate for testing. She is offered a referral, and will consider this but is not ready to set up a formal consult  In a visit lasting 45 minutes, greater than 50% of the time was spent face to face discussing options of radiotherapy, and coordinating the patient's care.     Carola Rhine, PAC    Tyler Pita, MD  [facility] Direct Dial: [phone]  Fax: [phone] [email or web address]  Skype  [email or web address]   This document serves as a record of services personally performed by Tyler Pita, MD and Shona Simpson, PA-C. It was created on their behalf by Rae Lips, a trained medical scribe. The creation of this record is based on the scribe's personal observations and the providers' statements to them. This document has been checked and approved by the attending providers.

## 2016-12-06 ENCOUNTER — Encounter (HOSPITAL_BASED_OUTPATIENT_CLINIC_OR_DEPARTMENT_OTHER): Payer: Medicare Other

## 2016-12-06 ENCOUNTER — Encounter (HOSPITAL_COMMUNITY): Payer: Self-pay

## 2016-12-06 VITALS — BP 111/54 | HR 54 | Temp 97.6°F | Resp 16 | Wt 114.6 lb

## 2016-12-06 DIAGNOSIS — C259 Malignant neoplasm of pancreas, unspecified: Secondary | ICD-10-CM | POA: Diagnosis not present

## 2016-12-06 DIAGNOSIS — C7802 Secondary malignant neoplasm of left lung: Secondary | ICD-10-CM | POA: Diagnosis not present

## 2016-12-06 DIAGNOSIS — C7800 Secondary malignant neoplasm of unspecified lung: Secondary | ICD-10-CM | POA: Diagnosis not present

## 2016-12-06 DIAGNOSIS — Z5111 Encounter for antineoplastic chemotherapy: Secondary | ICD-10-CM

## 2016-12-06 DIAGNOSIS — Z95828 Presence of other vascular implants and grafts: Secondary | ICD-10-CM | POA: Diagnosis not present

## 2016-12-06 DIAGNOSIS — R197 Diarrhea, unspecified: Secondary | ICD-10-CM | POA: Diagnosis not present

## 2016-12-06 LAB — CBC WITH DIFFERENTIAL/PLATELET
BASOS ABS: 0 10*3/uL (ref 0.0–0.1)
Basophils Relative: 0 %
EOS ABS: 0.2 10*3/uL (ref 0.0–0.7)
Eosinophils Relative: 3 %
HEMATOCRIT: 30.1 % — AB (ref 36.0–46.0)
Hemoglobin: 9.4 g/dL — ABNORMAL LOW (ref 12.0–15.0)
LYMPHS ABS: 1.6 10*3/uL (ref 0.7–4.0)
Lymphocytes Relative: 23 %
MCH: 30.9 pg (ref 26.0–34.0)
MCHC: 31.2 g/dL (ref 30.0–36.0)
MCV: 99 fL (ref 78.0–100.0)
MONO ABS: 0.7 10*3/uL (ref 0.1–1.0)
Monocytes Relative: 10 %
NEUTROS ABS: 4.4 10*3/uL (ref 1.7–7.7)
NEUTROS PCT: 64 %
PLATELETS: 287 10*3/uL (ref 150–400)
RBC: 3.04 MIL/uL — AB (ref 3.87–5.11)
RDW: 16.9 % — AB (ref 11.5–15.5)
WBC: 6.9 10*3/uL (ref 4.0–10.5)

## 2016-12-06 LAB — COMPREHENSIVE METABOLIC PANEL
ALBUMIN: 2.9 g/dL — AB (ref 3.5–5.0)
ALT: 13 U/L — ABNORMAL LOW (ref 14–54)
AST: 18 U/L (ref 15–41)
Alkaline Phosphatase: 119 U/L (ref 38–126)
Anion gap: 10 (ref 5–15)
BILIRUBIN TOTAL: 0.6 mg/dL (ref 0.3–1.2)
BUN: 21 mg/dL — AB (ref 6–20)
CHLORIDE: 109 mmol/L (ref 101–111)
CO2: 20 mmol/L — ABNORMAL LOW (ref 22–32)
Calcium: 8.7 mg/dL — ABNORMAL LOW (ref 8.9–10.3)
Creatinine, Ser: 1.31 mg/dL — ABNORMAL HIGH (ref 0.44–1.00)
GFR calc Af Amer: 44 mL/min — ABNORMAL LOW (ref >60)
GFR calc non Af Amer: 38 mL/min — ABNORMAL LOW (ref >60)
GLUCOSE: 76 mg/dL (ref 65–99)
POTASSIUM: 3.6 mmol/L (ref 3.5–5.1)
Sodium: 139 mmol/L (ref 135–145)
TOTAL PROTEIN: 6.4 g/dL — AB (ref 6.5–8.1)

## 2016-12-06 MED ORDER — SODIUM CHLORIDE 0.9 % IV SOLN
1000.0000 mg/m2 | Freq: Once | INTRAVENOUS | Status: AC
  Administered 2016-12-06: 1520 mg via INTRAVENOUS
  Filled 2016-12-06: qty 24.22

## 2016-12-06 MED ORDER — PROCHLORPERAZINE MALEATE 10 MG PO TABS
10.0000 mg | ORAL_TABLET | Freq: Once | ORAL | Status: AC
  Administered 2016-12-06: 10 mg via ORAL

## 2016-12-06 MED ORDER — HEPARIN SOD (PORK) LOCK FLUSH 100 UNIT/ML IV SOLN
500.0000 [IU] | Freq: Once | INTRAVENOUS | Status: AC | PRN
  Administered 2016-12-06: 500 [IU]
  Filled 2016-12-06: qty 5

## 2016-12-06 MED ORDER — PROCHLORPERAZINE MALEATE 10 MG PO TABS
ORAL_TABLET | ORAL | Status: AC
Start: 2016-12-06 — End: 2016-12-06
  Filled 2016-12-06: qty 1

## 2016-12-06 MED ORDER — SODIUM CHLORIDE 0.9% FLUSH
10.0000 mL | INTRAVENOUS | Status: DC | PRN
  Administered 2016-12-06: 10 mL
  Filled 2016-12-06: qty 10

## 2016-12-06 MED ORDER — SODIUM CHLORIDE 0.9 % IV SOLN
Freq: Once | INTRAVENOUS | Status: AC
  Administered 2016-12-06: 12:00:00 via INTRAVENOUS

## 2016-12-06 NOTE — Progress Notes (Signed)
Patient to treatment area for labs and chemotherapy.  Stated no change in diarrhea and no problems with Tarceva at this time.  Recent appointment with Dr. Tammi Klippel in [place].  No changes in SOB and able to perform ADL with no issues.  Good appetite per patient.    Reviewed labs with Dr. Talbert Cage and ok to treat today.    Patient tolerated chemotherapy with no complaints voiced.  Port site clean and dry with no bruising or swelling noted.  Flushed per protocol.  Band aid applied.  VSS with discharge and left ambulatory with no s/s of distress noted.

## 2016-12-06 NOTE — Patient Instructions (Signed)
[facility] Discharge Instructions for Patients Receiving Chemotherapy  Today you received the following chemotherapy agents gemzar.   To help prevent nausea and vomiting after your treatment, we encourage you to take your nausea medication.    If you develop nausea and vomiting that is not controlled by your nausea medication, call the clinic.   BELOW ARE SYMPTOMS THAT SHOULD BE REPORTED IMMEDIATELY:  *FEVER GREATER THAN 100.5 F  *CHILLS WITH OR WITHOUT FEVER  NAUSEA AND VOMITING THAT IS NOT CONTROLLED WITH YOUR NAUSEA MEDICATION  *UNUSUAL SHORTNESS OF BREATH  *UNUSUAL BRUISING OR BLEEDING  TENDERNESS IN MOUTH AND THROAT WITH OR WITHOUT PRESENCE OF ULCERS  *URINARY PROBLEMS  *BOWEL PROBLEMS  UNUSUAL RASH Items with * indicate a potential emergency and should be followed up as soon as possible.  Feel free to call the clinic should you have any questions or concerns. The clinic phone number is [phone].  Please show the [facility] at check-in to the Emergency Department and triage nurse.

## 2016-12-13 ENCOUNTER — Encounter (HOSPITAL_COMMUNITY): Payer: Self-pay

## 2016-12-13 ENCOUNTER — Ambulatory Visit (HOSPITAL_COMMUNITY): Payer: Medicare Other

## 2016-12-13 ENCOUNTER — Encounter (HOSPITAL_BASED_OUTPATIENT_CLINIC_OR_DEPARTMENT_OTHER): Payer: Medicare Other

## 2016-12-13 ENCOUNTER — Other Ambulatory Visit (HOSPITAL_COMMUNITY): Payer: Medicare Other

## 2016-12-13 VITALS — BP 126/58 | HR 90 | Temp 97.6°F | Resp 16 | Wt 114.4 lb

## 2016-12-13 DIAGNOSIS — C259 Malignant neoplasm of pancreas, unspecified: Secondary | ICD-10-CM

## 2016-12-13 DIAGNOSIS — C7800 Secondary malignant neoplasm of unspecified lung: Secondary | ICD-10-CM

## 2016-12-13 DIAGNOSIS — Z5111 Encounter for antineoplastic chemotherapy: Secondary | ICD-10-CM

## 2016-12-13 DIAGNOSIS — Z95828 Presence of other vascular implants and grafts: Secondary | ICD-10-CM | POA: Diagnosis not present

## 2016-12-13 DIAGNOSIS — R197 Diarrhea, unspecified: Secondary | ICD-10-CM | POA: Diagnosis not present

## 2016-12-13 LAB — CBC WITH DIFFERENTIAL/PLATELET
BASOS ABS: 0 10*3/uL (ref 0.0–0.1)
BASOS PCT: 0 %
EOS PCT: 0 %
Eosinophils Absolute: 0 10*3/uL (ref 0.0–0.7)
HCT: 28.4 % — ABNORMAL LOW (ref 36.0–46.0)
Hemoglobin: 8.8 g/dL — ABNORMAL LOW (ref 12.0–15.0)
Lymphocytes Relative: 42 %
Lymphs Abs: 1.4 10*3/uL (ref 0.7–4.0)
MCH: 30.3 pg (ref 26.0–34.0)
MCHC: 31 g/dL (ref 30.0–36.0)
MCV: 97.9 fL (ref 78.0–100.0)
MONO ABS: 0.4 10*3/uL (ref 0.1–1.0)
Monocytes Relative: 12 %
Neutro Abs: 1.6 10*3/uL — ABNORMAL LOW (ref 1.7–7.7)
Neutrophils Relative %: 46 %
PLATELETS: 199 10*3/uL (ref 150–400)
RBC: 2.9 MIL/uL — ABNORMAL LOW (ref 3.87–5.11)
RDW: 16.4 % — AB (ref 11.5–15.5)
WBC: 3.4 10*3/uL — ABNORMAL LOW (ref 4.0–10.5)

## 2016-12-13 LAB — COMPREHENSIVE METABOLIC PANEL
ALK PHOS: 103 U/L (ref 38–126)
ALT: 24 U/L (ref 14–54)
ANION GAP: 9 (ref 5–15)
AST: 35 U/L (ref 15–41)
Albumin: 2.9 g/dL — ABNORMAL LOW (ref 3.5–5.0)
BUN: 13 mg/dL (ref 6–20)
CALCIUM: 8.5 mg/dL — AB (ref 8.9–10.3)
CO2: 20 mmol/L — AB (ref 22–32)
Chloride: 110 mmol/L (ref 101–111)
Creatinine, Ser: 1.34 mg/dL — ABNORMAL HIGH (ref 0.44–1.00)
GFR calc Af Amer: 43 mL/min — ABNORMAL LOW (ref >60)
GFR, EST NON AFRICAN AMERICAN: 37 mL/min — AB (ref >60)
Glucose, Bld: 170 mg/dL — ABNORMAL HIGH (ref 65–99)
Potassium: 3.1 mmol/L — ABNORMAL LOW (ref 3.5–5.1)
SODIUM: 139 mmol/L (ref 135–145)
Total Bilirubin: 0.5 mg/dL (ref 0.3–1.2)
Total Protein: 6.3 g/dL — ABNORMAL LOW (ref 6.5–8.1)

## 2016-12-13 MED ORDER — POTASSIUM CHLORIDE CRYS ER 20 MEQ PO TBCR
40.0000 meq | EXTENDED_RELEASE_TABLET | Freq: Once | ORAL | Status: AC
  Administered 2016-12-13: 40 meq via ORAL

## 2016-12-13 MED ORDER — POTASSIUM CHLORIDE CRYS ER 20 MEQ PO TBCR
EXTENDED_RELEASE_TABLET | ORAL | Status: AC
  Filled 2016-12-13: qty 2

## 2016-12-13 MED ORDER — PROCHLORPERAZINE MALEATE 10 MG PO TABS
10.0000 mg | ORAL_TABLET | Freq: Once | ORAL | Status: AC
  Administered 2016-12-13: 10 mg via ORAL

## 2016-12-13 MED ORDER — SODIUM CHLORIDE 0.9% FLUSH
10.0000 mL | INTRAVENOUS | Status: DC | PRN
  Administered 2016-12-13: 10 mL
  Filled 2016-12-13: qty 10

## 2016-12-13 MED ORDER — SODIUM CHLORIDE 0.9 % IV SOLN
Freq: Once | INTRAVENOUS | Status: AC
  Administered 2016-12-13: 12:00:00 via INTRAVENOUS

## 2016-12-13 MED ORDER — PROCHLORPERAZINE MALEATE 10 MG PO TABS
ORAL_TABLET | ORAL | Status: AC
  Filled 2016-12-13: qty 1

## 2016-12-13 MED ORDER — SODIUM CHLORIDE 0.9 % IV SOLN
1000.0000 mg/m2 | Freq: Once | INTRAVENOUS | Status: AC
  Administered 2016-12-13: 1520 mg via INTRAVENOUS
  Filled 2016-12-13: qty 15.78

## 2016-12-13 MED ORDER — ERLOTINIB HCL 100 MG PO TABS: 100.0000 mg | ORAL_TABLET | Freq: Every day | ORAL | 3 refills | Status: DC

## 2016-12-13 MED ORDER — HEPARIN SOD (PORK) LOCK FLUSH 100 UNIT/ML IV SOLN
500.0000 [IU] | Freq: Once | INTRAVENOUS | Status: AC | PRN
  Administered 2016-12-13: 500 [IU]
  Filled 2016-12-13: qty 5

## 2016-12-13 NOTE — Progress Notes (Signed)
Patient is taking Tarceva and has not missed any doses and reports no side effects at this time.   Labs reviewed with MD, potassium noted, absolute neutrophils noted. Will proceed with treatment.  Treatment given per orders. Patient tolerated it well without problems. Vitals stable and discharged home from clinic ambulatory. Follow up as scheduled.

## 2016-12-13 NOTE — Addendum Note (Signed)
Addended by: Joanne Gavel T on: 12/13/2016 04:02 PM   Modules accepted: Orders

## 2016-12-13 NOTE — Patient Instructions (Signed)
[facility] Discharge Instructions for Patients Receiving Chemotherapy   Beginning January 23rd 2017 lab work for the Cancer Center will be done in the  Main lab at [place] on 1st floor. If you have a lab appointment with the Cancer Center please come in thru the  Main Entrance and check in at the main information desk   Today you received the following chemotherapy agents   To help prevent nausea and vomiting after your treatment, we encourage you to take your nausea medication     If you develop nausea and vomiting, or diarrhea that is not controlled by your medication, call the clinic.  The clinic phone number is [phone]. Office hours are Monday-Friday 8:30am-5:00pm.  BELOW ARE SYMPTOMS THAT SHOULD BE REPORTED IMMEDIATELY:  *FEVER GREATER THAN 101.0 F  *CHILLS WITH OR WITHOUT FEVER  NAUSEA AND VOMITING THAT IS NOT CONTROLLED WITH YOUR NAUSEA MEDICATION  *UNUSUAL SHORTNESS OF BREATH  *UNUSUAL BRUISING OR BLEEDING  TENDERNESS IN MOUTH AND THROAT WITH OR WITHOUT PRESENCE OF ULCERS  *URINARY PROBLEMS  *BOWEL PROBLEMS  UNUSUAL RASH Items with * indicate a potential emergency and should be followed up as soon as possible. If you have an emergency after office hours please contact your primary care physician or go to the nearest emergency department.  Please call the clinic during office hours if you have any questions or concerns.   You may also contact the Patient Navigator at [phone] should you have any questions or need assistance in obtaining follow up care.      Resources For Cancer Patients and their Caregivers ? American Cancer Society: Can assist with transportation, wigs, general needs, runs Look Good Feel Better.        1-888-227-6333 ? Cancer Care: Provides financial assistance, online support groups, medication/co-pay assistance.  1-800-813-HOPE (4673) ? [facility] Assists [place] cancer  patients and their families through emotional , educational and financial support.  336-427-4357 ? [place] DSS Where to apply for food stamps, Medicaid and utility assistance. 336-342-1394 ? RCATS: Transportation to medical appointments. 336-347-2287 ? Social Security Administration: May apply for disability if have a Stage IV cancer. 336-342-7796 1-800-772-1213 ? [place] [facility], Disability and Transit Services: Assists with nutrition, care and transit needs. 336-349-2343         

## 2016-12-14 ENCOUNTER — Encounter (HOSPITAL_BASED_OUTPATIENT_CLINIC_OR_DEPARTMENT_OTHER): Payer: Medicare Other

## 2016-12-14 VITALS — BP 131/61 | HR 63 | Temp 97.8°F | Resp 16

## 2016-12-14 DIAGNOSIS — D6481 Anemia due to antineoplastic chemotherapy: Secondary | ICD-10-CM

## 2016-12-14 DIAGNOSIS — C7800 Secondary malignant neoplasm of unspecified lung: Principal | ICD-10-CM

## 2016-12-14 DIAGNOSIS — C259 Malignant neoplasm of pancreas, unspecified: Secondary | ICD-10-CM

## 2016-12-14 MED ORDER — DARBEPOETIN ALFA 500 MCG/ML IJ SOSY
PREFILLED_SYRINGE | INTRAMUSCULAR | Status: AC
  Filled 2016-12-14: qty 1

## 2016-12-14 MED ORDER — DARBEPOETIN ALFA 500 MCG/ML IJ SOSY
500.0000 ug | PREFILLED_SYRINGE | Freq: Once | INTRAMUSCULAR | Status: AC
  Administered 2016-12-14: 500 ug via SUBCUTANEOUS

## 2016-12-14 NOTE — Progress Notes (Signed)
[name] [name] presents today for injection per MD orders. Aranesp 512mcg administered SQ in right Abdomen. Administration without incident. Patient tolerated well.  Treatment given per orders. Patient tolerated it well without problems. Vitals stable and discharged home from clinic ambulatory. Follow up as scheduled.

## 2016-12-20 ENCOUNTER — Ambulatory Visit (HOSPITAL_COMMUNITY): Payer: Medicare Other

## 2016-12-20 DIAGNOSIS — Z23 Encounter for immunization: Secondary | ICD-10-CM | POA: Diagnosis not present

## 2016-12-27 ENCOUNTER — Ambulatory Visit (HOSPITAL_COMMUNITY): Payer: Medicare Other | Admitting: Adult Health

## 2016-12-27 ENCOUNTER — Ambulatory Visit (HOSPITAL_COMMUNITY): Payer: Medicare Other

## 2016-12-28 DIAGNOSIS — C253 Malignant neoplasm of pancreatic duct: Secondary | ICD-10-CM | POA: Diagnosis not present

## 2016-12-28 DIAGNOSIS — D492 Neoplasm of unspecified behavior of bone, soft tissue, and skin: Secondary | ICD-10-CM | POA: Diagnosis not present

## 2016-12-30 ENCOUNTER — Other Ambulatory Visit (HOSPITAL_COMMUNITY): Payer: Self-pay | Admitting: Adult Health

## 2016-12-30 ENCOUNTER — Telehealth (HOSPITAL_COMMUNITY): Payer: Self-pay | Admitting: Adult Health

## 2016-12-30 DIAGNOSIS — C259 Malignant neoplasm of pancreas, unspecified: Secondary | ICD-10-CM

## 2016-12-30 DIAGNOSIS — R197 Diarrhea, unspecified: Secondary | ICD-10-CM

## 2016-12-30 DIAGNOSIS — C7800 Secondary malignant neoplasm of unspecified lung: Principal | ICD-10-CM

## 2016-12-30 MED ORDER — DIPHENOXYLATE-ATROPINE 2.5-0.025 MG PO TABS: 1.0000 | ORAL_TABLET | Freq: Four times a day (QID) | ORAL | 0 refills | Status: DC | PRN

## 2016-12-30 NOTE — Telephone Encounter (Signed)
Received notice from Si Raider, our scheduler, that patient called and wanted to speak with me.   I returned Ms. Finelli's call. She expressed concerns: "Do we even know if this chemo is working?" "Would it be okay to take a break for awhile and just recheck my scans in a few months?"   Shared with her that it is difficult to know if her disease is responding to her new treatment because she is not due for scans yet. I offered understanding in her reported fatigue in getting treatment and going through so much with her cancer for the past several years.  Of course, we could take a break from treatment but there would be very high risk of significant disease progression in the interim.  We could also stop treatment altogether and pursue more palliative care options.    I shared with her that I think these issues are best reserved to discuss in person. She is scheduled to see [place] next week; Recommended she come in as scheduled and we can have further goals of care discussions with her at that time.   My suspicion is that she is getting tired of continuing treatment, but again reiterated that I would prefer [place] talk about this with Dr. Talbert Cage during her scheduled visit next week.  She agreed.   I will forward this note to Dr. Talbert Cage so she is aware.   Patient also requesting refill of Lomotil; states that [facility] will not fill it early "unless they have a note from the doctor."  New Rx printed and given to Jaynie Collins, LPN who will help me get prescription faxed to [facility] for the patient.       Mike Craze, NP [place] [phone]

## 2017-01-03 ENCOUNTER — Ambulatory Visit (HOSPITAL_COMMUNITY): Payer: Medicare Other

## 2017-01-03 ENCOUNTER — Encounter (HOSPITAL_COMMUNITY): Payer: Self-pay | Admitting: Oncology

## 2017-01-03 ENCOUNTER — Other Ambulatory Visit: Payer: Self-pay

## 2017-01-03 ENCOUNTER — Encounter (HOSPITAL_COMMUNITY): Payer: Medicare Other | Attending: Adult Health

## 2017-01-03 ENCOUNTER — Encounter (HOSPITAL_BASED_OUTPATIENT_CLINIC_OR_DEPARTMENT_OTHER): Payer: Medicare Other | Admitting: Oncology

## 2017-01-03 VITALS — BP 134/60 | HR 56 | Temp 97.5°F | Resp 18 | Wt 120.9 lb

## 2017-01-03 VITALS — BP 133/41 | HR 52 | Temp 97.8°F | Resp 18

## 2017-01-03 DIAGNOSIS — C259 Malignant neoplasm of pancreas, unspecified: Secondary | ICD-10-CM | POA: Diagnosis not present

## 2017-01-03 DIAGNOSIS — C7800 Secondary malignant neoplasm of unspecified lung: Secondary | ICD-10-CM | POA: Insufficient documentation

## 2017-01-03 DIAGNOSIS — R197 Diarrhea, unspecified: Secondary | ICD-10-CM | POA: Diagnosis not present

## 2017-01-03 DIAGNOSIS — Z5111 Encounter for antineoplastic chemotherapy: Secondary | ICD-10-CM

## 2017-01-03 DIAGNOSIS — D6481 Anemia due to antineoplastic chemotherapy: Secondary | ICD-10-CM | POA: Diagnosis not present

## 2017-01-03 DIAGNOSIS — R21 Rash and other nonspecific skin eruption: Secondary | ICD-10-CM

## 2017-01-03 DIAGNOSIS — Z95828 Presence of other vascular implants and grafts: Secondary | ICD-10-CM | POA: Diagnosis not present

## 2017-01-03 DIAGNOSIS — C7802 Secondary malignant neoplasm of left lung: Secondary | ICD-10-CM | POA: Diagnosis not present

## 2017-01-03 DIAGNOSIS — E876 Hypokalemia: Secondary | ICD-10-CM

## 2017-01-03 LAB — COMPREHENSIVE METABOLIC PANEL
ALBUMIN: 2.9 g/dL — AB (ref 3.5–5.0)
ALK PHOS: 108 U/L (ref 38–126)
ALT: 17 U/L (ref 14–54)
AST: 26 U/L (ref 15–41)
Anion gap: 6 (ref 5–15)
BILIRUBIN TOTAL: 0.6 mg/dL (ref 0.3–1.2)
BUN: 15 mg/dL (ref 6–20)
CALCIUM: 8.4 mg/dL — AB (ref 8.9–10.3)
CO2: 20 mmol/L — ABNORMAL LOW (ref 22–32)
CREATININE: 1.17 mg/dL — AB (ref 0.44–1.00)
Chloride: 113 mmol/L — ABNORMAL HIGH (ref 101–111)
GFR calc Af Amer: 50 mL/min — ABNORMAL LOW (ref >60)
GFR calc non Af Amer: 43 mL/min — ABNORMAL LOW (ref >60)
GLUCOSE: 99 mg/dL (ref 65–99)
Potassium: 4 mmol/L (ref 3.5–5.1)
Sodium: 139 mmol/L (ref 135–145)
TOTAL PROTEIN: 5.7 g/dL — AB (ref 6.5–8.1)

## 2017-01-03 LAB — CBC WITH DIFFERENTIAL/PLATELET
Basophils Absolute: 0 10*3/uL (ref 0.0–0.1)
Basophils Relative: 0 %
Eosinophils Absolute: 0 10*3/uL (ref 0.0–0.7)
Eosinophils Relative: 1 %
HEMATOCRIT: 30.7 % — AB (ref 36.0–46.0)
HEMOGLOBIN: 9.7 g/dL — AB (ref 12.0–15.0)
Lymphocytes Relative: 18 %
Lymphs Abs: 1 10*3/uL (ref 0.7–4.0)
MCH: 31.7 pg (ref 26.0–34.0)
MCHC: 31.6 g/dL (ref 30.0–36.0)
MCV: 100.3 fL — ABNORMAL HIGH (ref 78.0–100.0)
MONOS PCT: 19 %
Monocytes Absolute: 1 10*3/uL (ref 0.1–1.0)
Neutro Abs: 3.2 10*3/uL (ref 1.7–7.7)
Neutrophils Relative %: 62 %
Platelets: 278 10*3/uL (ref 150–400)
RBC: 3.06 MIL/uL — ABNORMAL LOW (ref 3.87–5.11)
RDW: 17.8 % — ABNORMAL HIGH (ref 11.5–15.5)
WBC: 5.2 10*3/uL (ref 4.0–10.5)

## 2017-01-03 MED ORDER — PROCHLORPERAZINE MALEATE 10 MG PO TABS
10.0000 mg | ORAL_TABLET | Freq: Once | ORAL | Status: AC
  Administered 2017-01-03: 10 mg via ORAL
  Filled 2017-01-03: qty 1

## 2017-01-03 MED ORDER — SODIUM CHLORIDE 0.9 % IV SOLN
1000.0000 mg/m2 | Freq: Once | INTRAVENOUS | Status: AC
  Administered 2017-01-03: 1520 mg via INTRAVENOUS
  Filled 2017-01-03: qty 24.2

## 2017-01-03 MED ORDER — SODIUM CHLORIDE 0.9% FLUSH
10.0000 mL | INTRAVENOUS | Status: DC | PRN
  Administered 2017-01-03: 10 mL
  Filled 2017-01-03: qty 10

## 2017-01-03 MED ORDER — SODIUM CHLORIDE 0.9 % IV SOLN
Freq: Once | INTRAVENOUS | Status: AC
  Administered 2017-01-03: 11:00:00 via INTRAVENOUS

## 2017-01-03 MED ORDER — HEPARIN SOD (PORK) LOCK FLUSH 100 UNIT/ML IV SOLN
500.0000 [IU] | Freq: Once | INTRAVENOUS | Status: AC | PRN
  Administered 2017-01-03: 500 [IU]
  Filled 2017-01-03: qty 5

## 2017-01-03 NOTE — Progress Notes (Signed)
[name] [name] tolerated Gemzar infusion well without complaints or incident. Labs reviewed with Dr. Talbert Cage prior to administering this medication.Pt continues to take her Tarceva as prescribed without issues VSS upon discharge. Pt discharged self ambulatory in satisfactory condition accompanied by her sister

## 2017-01-03 NOTE — Patient Instructions (Signed)
[employer] at [facility] Discharge Instructions  RECOMMENDATIONS MADE BY THE CONSULTANT AND ANY TEST RESULTS WILL BE SENT TO YOUR REFERRING PHYSICIAN.  You were seen today by Dr. Twana First You will get treatment today Continue taking your Tarceva at home as prescribed Follow up as scheduled.   Thank you for choosing [facility] at [facility] to provide your oncology and hematology care.  To afford each patient quality time with our provider, please arrive at least 15 minutes before your scheduled appointment time.    If you have a lab appointment with the [facility] please come in thru the  Main Entrance and check in at the main information desk  You need to re-schedule your appointment should you arrive 10 or more minutes late.  We strive to give you quality time with our providers, and arriving late affects you and other patients whose appointments are after yours.  Also, if you no show three or more times for appointments you may be dismissed from the clinic at the providers discretion.     Again, thank you for choosing [facility].  Our hope is that these requests will decrease the amount of time that you wait before being seen by our physicians.       _____________________________________________________________  Should you have questions after your visit to [facility], please contact our office at [phone] between the hours of 8:30 a.m. and 4:30 p.m.  Voicemails left after 4:30 p.m. will not be returned until the following business day.  For prescription refill requests, have your pharmacy contact our office.       Resources For Cancer Patients and their Caregivers ? American Cancer Society: Can assist with transportation, wigs, general needs, runs Look Good Feel Better.        4438367655 ? Cancer Care: Provides financial assistance, online support groups, medication/co-pay assistance.   1-800-813-HOPE 587-480-5398) ? [facility] Assists [place] cancer patients and their families through emotional , educational and financial support.  520-683-3585 ? [place] DSS Where to apply for food stamps, Medicaid and utility assistance. 985-687-4145 ? RCATS: Transportation to medical appointments. 352-240-4787 ? Social Security Administration: May apply for disability if have a Stage IV cancer. 332-298-2338 (539) 194-6299 ? [employer], Disability and Transit Services: Assists with nutrition, care and transit needs. [place] Support Programs: @10RELATIVEDAYS @ > Cancer Support Group  2nd Tuesday of the month 1pm-2pm, Journey Room  > Creative Journey  3rd Tuesday of the month 1130am-1pm, Journey Room  > Look Good Feel Better  1st Wednesday of the month 10am-12 noon, Journey Room (Call [place] to register [phone])

## 2017-01-03 NOTE — Progress Notes (Signed)
 [facility] [address] , [place] [address]   CLINIC:  Medical Oncology/Hematology  PCP:  Shah, Ashish, MD [address] [place] [address] [phone]   REASON FOR VISIT:  Follow-up for Stage IV adenocarcinoma of pancreas with lung mets   CURRENT THERAPY: 5-FU/Leucovorin/Liposomal Irinotecan, resumed 04/26/16 (palliative treatment)   BRIEF ONCOLOGIC HISTORY:    Pancreatic cancer metastasized to lung (HCC)   09/03/2008 Surgery    Whipple with Dr. Russell Howerton for T3N1 3/12 LN+ Pancreatic Adenocarcinoma      11/13/2008 - 09/12/2009 Chemotherapy    Adjuvant Gemcitabine X 6 months      08/31/2009 Surgery    Metastectomy with Dr. Burney RUL 2 nodules, RLL (wedge resection X 2)      08/31/2009 Pathology Results    Metastatic adenocarcinoma TTF-1 negative      06/03/2010 Surgery    Left video-assisted thoracoscopic surgery, resection of left lower lobe lesion. with Dr. Burney      06/04/2010 Pathology Results    adenocarcinoma positive for cytokeratin 7, focally positive for CDX2, with scattered staining for TTF1 cytokeratin 20. [facility] opinion felt to be lung primary although noted to stain for GI markers, not felt to be pancreatic primary. EGFR and ALK negative      03/31/2011 - 09/04/2011 Radiation Therapy    SBRT LUL 18Gy Dr. Manning      03/15/2012 - 06/13/2012 Chemotherapy    5-FU based chemotherapy      02/13/2013 - 05/20/2013 Chemotherapy    Gemzar Abraxane for metastatic disease (given to cover both primaries at [facility])      07/15/2014 PET scan    Mild progression of pulm mets, hypermet mesenteric tumor assoc with  SMA is stable to slightly increased in degree of FDG uptake      07/23/2014 Imaging         07/30/2014 - 09/10/2014 Chemotherapy    Gemzar abraxane stopped due to poor tolerance      04/06/2015 Procedure    L 20 French chest tube for spontaneous pneumothorax, by Dr. Peter Van Trigt      04/20/2015 Pathology Results   High grade carcinoma c/w patient's known pancreatic primary (secondary opinion at [facility]) PDL-1 high expression, preserved major and minor MMR, MSI stable, Foundation ONE testing performed,       04/20/2015 Surgery    Left VATS, mini thoracotomy with stapling and oversewing of blebs, Talc pleurodesis. performed secondary to persistent air leak not improved with chest tube drainage      07/29/2015 Imaging    No signif change in appear of multifocal B cavitary pulmonary lesions, postop appearance upper abdomen, no findings of met disease to abd or pelvis,      09/29/2015 Imaging    Slight progression of multifocal cavitary pulmonary metastases, as discussed above. 2. Interval development of a lytic lesion in the antral lateral aspect of the left fifth rib where there is a nondisplaced pathologic fracture. No other definite osseous metastases are identified. 3. Prominent soft tissue adjacent to the proximal superior mesenteric artery and in the aortocaval nodal station, similar to prior examinations, likely to represent treated metastatic lymphadenopathy. 4. Epigastric ventral hernia containing several short loops of small bowel. There is a focally dilated loop of small bowel adjacent to this, however, this is an isolated finding. No other dilatation of small bowel or colon to suggest frank bowel obstruction at this time.       10/19/2015 - 03/01/2016 Chemotherapy      The patient had palonosetron (ALOXI) injection 0.25 mg, 0.25 mg, Intravenous,  Once, 10 of 12 cycles  leucovorin 652 mg in dextrose 5 % 250 mL infusion, 400 mg/m2 = 652 mg, Intravenous,  Once, 10 of 12 cycles  fluorouracil (ADRUCIL) 2,950 mg in sodium chloride 0.9 % 91 mL chemo infusion, 1,800 mg/m2 = 2,950 mg (100 % of original dose 1,800 mg/m2), Intravenous, 1 Day/Dose, 10 of 12 cycles Dose modification: 1,920 mg/m2 (original dose 1,800 mg/m2, Cycle 1, Reason: Provider Judgment), 1,800 mg/m2 (original dose  1,800 mg/m2, Cycle 1, Reason: Provider Judgment), 1,500 mg/m2 (original dose 1,800 mg/m2, Cycle 2, Reason: Dose not tolerated)  irinotecan LIPOSOME (ONIVYDE) 55.9 mg in sodium chloride 0.9 % 500 mL chemo infusion, 35 mg/m2 = 55.9 mg (100 % of original dose 35 mg/m2), Intravenous, Once, 10 of 12 cycles Dose modification: 35 mg/m2 (original dose 35 mg/m2, Cycle 1, Reason: Provider Judgment)  for chemotherapy treatment.        01/12/2016 Imaging    CT CAP- 1. Cavitary lung metastases are stable to decreased in size. 2. Expansile mixed lytic and sclerotic bone metastasis in the anterior left fifth rib is increased in sclerosis and mildly increased in size. 3. Infiltrative soft tissue in the retroperitoneum abutting the SMA is stable. 4. No new sites of metastatic disease in the chest, abdomen or pelvis.      03/05/2016 Procedure    Chest tube placed for spontaneous pneumothorax by Dr. Cyndia Bent      03/05/2016 - 03/10/2016 Hospital Admission    Admit date: 03/05/2016 Admission diagnosis: Spontenous pneumothorax Additional comments: chest tube placed by Dr. Cyndia Bent on 03/05/2016      03/14/2016 - 03/18/2016 Hospital Admission    Admit date: 03/14/2016 Admission diagnosis: Recurrent spontaneous pneumothorax Additional comments:       04/26/2016 -  Chemotherapy    The patient had palonosetron (ALOXI) injection 0.25 mg, 0.25 mg, Intravenous,  Once, 10 of 12 cycles  leucovorin 652 mg in dextrose 5 % 250 mL infusion, 400 mg/m2 = 652 mg, Intravenous,  Once, 10 of 12 cycles  fluorouracil (ADRUCIL) 2,950 mg in sodium chloride 0.9 % 91 mL chemo infusion, 1,800 mg/m2 = 2,950 mg (100 % of original dose 1,800 mg/m2), Intravenous, 1 Day/Dose, 10 of 12 cycles Dose modification: 1,920 mg/m2 (original dose 1,800 mg/m2, Cycle 1, Reason: Provider Judgment), 1,800 mg/m2 (original dose 1,800 mg/m2, Cycle 1, Reason: Provider Judgment), 1,500 mg/m2 (original dose 1,800 mg/m2, Cycle 2, Reason: Dose not  tolerated)  irinotecan LIPOSOME (ONIVYDE) 55.9 mg in sodium chloride 0.9 % 500 mL chemo infusion, 35 mg/m2 = 55.9 mg (100 % of original dose 35 mg/m2), Intravenous, Once, 10 of 12 cycles Dose modification: 35 mg/m2 (original dose 35 mg/m2, Cycle 1, Reason: Provider Judgment)  for chemotherapy treatment.        05/09/2016 Imaging    CT CAP- CT CHEST IMPRESSION  1. Mild progression of pulmonary metastasis. Although the majority of cavitary lung lesions are primarily similar, there are new and increased small cavitary nodules. 2. Trace residual loculated inferolateral right pleural air. 3. No thoracic adenopathy. 4. Progression of anterior left fifth rib metastasis with soft tissue component surrounding.  CT ABDOMEN AND PELVIS IMPRESSION  1. Status post Whipple procedure, without locally recurrent disease. 2. Similar soft tissue fullness within the retroperitoneum. 3. Pneumobilia. Possible mild cirrhosis. 4. Similar small bowel containing ventral abdominal wall laxity. 5. Similar abdominal aortic aneurysm.      05/09/2016 Progression    Progression of disease  after being off therapy x 2 months.      07/26/2016 Imaging    CT CAP- 1. Stable appearance of pulmonary metastases. 2. No thoracic adenopathy. 3. 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 4. Status post Whipple procedure. 5. Similar soft tissue within the retroperitoneum involving the SMA and aorto caval space. 6. Pneumobilia and possible mild cirrhosis. 7. Ventral abdominal wall hernia containing nonobstructed loops of small bowel. 8. Abdominal aortic aneurysm measures 3.7 cm. Unchanged from previous exam. 9. Aortic Atherosclerosis (ICD10-I70.0) and Emphysema (ICD10-J43.9). Coronary artery calcifications noted.      07/26/2016 Imaging    CT head- 1. No metastatic disease or acute intracranial abnormality identified. 2. Recannulized right transverse sinus through right IJ bulb thrombosis since  the 2016 MRI. Small volume of residual chronic nonocclusive thrombus. 3. Mild for age chronic small vessel disease is unchanged since 2016.      11/10/2016 Imaging    CT C/A/P: IMPRESSION: CT CHEST IMPRESSION  1. Mild progression of pulmonary metastasis. 2. Slight enlargement of a  left fifth rib metastasis. 3. No thoracic adenopathy. 4.  Emphysema (ICD10-J43.9). 5. Coronary artery atherosclerosis. Aortic Atherosclerosis (ICD10-I70.0).  CT ABDOMEN AND PELVIS IMPRESSION  1. Status post Whipple procedure. No evidence of progressive disease within the abdomen. 2. Grossly similar soft tissue thickening about the origin of the superior mesenteric artery. 3. Probable cirrhosis. 4. Similar ventral abdominal wall hernia containing nonobstructive small bowel. 5. Similar infrarenal aortic ectasia.        INTERVAL HISTORY:  [name] [name] 76 y.o. female returns to cancer center today for routine follow-up.  She has had a 2-week break from IV Gemzar per her request.  However she states that she has been taking Tarceva continuously these past 2 weeks.  She states that she is noticed some puffiness in her face as well as a mild rash on her face as well.  Otherwise her appetite is good.  She continues to perform all her ADLs denies any wosening fatigue.  She denies any chest pain, shortness of breath, abdominal pain, nausea, vomiting, diarrhea.  She states that she has noticed a knot near her left fifth rib that was sometimes get bigger and then other times it smaller.  REVIEW OF SYSTEMS:  Review of Systems  Constitutional: Positive for fatigue. Negative for chills and fever.  HENT:   Negative for mouth sores.   Eyes: Negative.   Respiratory: Negative for cough and shortness of breath.   Cardiovascular: Negative.   Gastrointestinal: Negative for abdominal pain, blood in stool, constipation, diarrhea, nausea and vomiting.  Endocrine: Negative.   Genitourinary: Negative for dysuria and  hematuria.   Musculoskeletal: Negative.   Skin: Negative.  Negative for rash.  Neurological: Negative.   Psychiatric/Behavioral: Negative.      PAST MEDICAL/SURGICAL HISTORY:  Past Medical History:  Diagnosis Date  . Abdominal wall hernia   . Anemia   . Anemia due to antineoplastic chemotherapy 07/05/2016  . Aortic aneurysm (HCC)    small aortic aneurysm  . Arthritis    "little in my right hand" (03/10/2016)  . Cervical cancer ([place]) 1963  . COPD (chronic obstructive pulmonary disease) ([place])   . Emphysema   . GERD (gastroesophageal reflux disease)   . Goals of care, counseling/discussion 02/02/2016  . Hypercholesterolemia   . Hypertension   . Low serum vitamin B12 01/20/2016  . Pancreatic adenocarcinoma [facility] July 2010  . Pancreatic cancer metastasized to lung [facility]) 09/04/2015   Past Surgical History:  Procedure  Laterality Date  . BREAST CYST EXCISION Right 1988  . BUNIONECTOMY Bilateral 2007  . CARDIAC CATHETERIZATION    . CATARACT EXTRACTION W/ INTRAOCULAR LENS  IMPLANT, BILATERAL Bilateral 2009  . CHOLECYSTECTOMY  July 2010   done w/ Whipple procedure  . IR GENERIC HISTORICAL  03/15/2016   IR GUIDED DRAIN W CATHETER PLACEMENT 03/15/2016 Greggory Keen, MD [facility]-INTERV RAD  . SKIN CANCER EXCISION Left    nasal fold  . THYROID SURGERY     "had a growth taken off"  . TONSILLECTOMY  1943  . VAGINAL HYSTERECTOMY  1963   Cervical Cancer  . WHIPPLE PROCEDURE  July 2010   pancreatic adenocarcinoma     SOCIAL HISTORY:  Social History   Socioeconomic History  . Marital status: Widowed    Spouse name: Not on file  . Number of children: 0  . Years of education: Not on file  . Highest education level: Not on file  Social Needs  . Financial resource strain: Not on file  . Food insecurity - worry: Not on file  . Food insecurity - inability: Not on file  . Transportation needs - medical: Not on file  . Transportation needs - non-medical: Not on file  Occupational History     Employer: RETIRED  Tobacco Use  . Smoking status: Former Smoker    Packs/day: 1.00    Years: 30.00    Pack years: 30.00    Types: Cigarettes    Last attempt to quit: 02/29/1996    Years since quitting: 20.8  . Smokeless tobacco: Never Used  Substance and Sexual Activity  . Alcohol use: No  . Drug use: No  . Sexual activity: No  Other Topics Concern  . Not on file  Social History Narrative  . Not on file    FAMILY HISTORY:  Family History  Problem Relation Age of Onset  . Pancreatic cancer Brother   . Colon cancer Sister 89       12/2006 dx surgery and chemotherapy  . Ovarian cancer Sister 3       Ovarian ca,  surgery and chemotherapy  . Ovarian cancer Other   . Cancer Other        lung to brain    CURRENT MEDICATIONS:  Outpatient Encounter Medications as of 01/03/2017  Medication Sig Note  . acetaminophen (TYLENOL) 325 MG tablet Take 2 tablets (650 mg total) by mouth every 6 (six) hours as needed for mild pain (or Fever >/= 101). (Patient not taking: Reported on 12/05/2016)   . amoxicillin (AMOXIL) 500 MG capsule TAKE ONE CAPSULE BY MOUTH THREE TIMES DAILY WITH FOOD   . cholecalciferol (VITAMIN D) 1000 units tablet Take 2,000 Units by mouth daily.   Marland Kitchen CREON 36000 units CPEP capsule TAKE 1 CAPSULE THREE TIMES A DAY WITH MEALS   . diphenoxylate-atropine (LOMOTIL) 2.5-0.025 MG tablet Take 1 tablet 4 (four) times daily as needed by mouth for diarrhea or loose stools.   . erlotinib (TARCEVA) 100 MG tablet Take 1 tablet (100 mg total) by mouth daily. Take on an empty stomach 1 hour before meals or 2 hours after   . feeding supplement, ENSURE ENLIVE, (ENSURE ENLIVE) LIQD Take 237 mLs by mouth 2 (two) times daily between meals.   . Gemcitabine HCl (GEMZAR IV) Inject into the vein.   . hydrochlorothiazide (HYDRODIURIL) 25 MG tablet Take 12.5 mg by mouth every morning.  03/14/2016: 1/2 TAB  . ibuprofen (ADVIL,MOTRIN) 400 MG tablet Take 1 tablet (400 mg  total) by mouth every 6 (six)  hours as needed for mild pain. (Patient not taking: Reported on 12/05/2016)   . lidocaine (XYLOCAINE) 2 % solution Use as directed 20 mLs in the mouth or throat as needed for mouth pain. (Patient not taking: Reported on 12/05/2016)   . loperamide (IMODIUM A-D) 2 MG tablet At the onset of diarrhea, take 2 tabs. Then take 1 tab every 2 hours until you have gone 12 hours without having a loose stool. If it is bedtime and you have a loose stool(s) take 2 tabs every 4 hours until morning. Call [facility].   Marland Kitchen losartan (COZAAR) 100 MG tablet Take 100 mg by mouth every morning.    . magic mouthwash w/lidocaine SOLN Take 5 mLs by mouth 4 (four) times daily as needed for mouth pain. (Patient not taking: Reported on 12/05/2016)   . metoprolol succinate (TOPROL-XL) 50 MG 24 hr tablet Take 25 mg by mouth every morning. Take with or immediately following a meal.  03/14/2016: 1/2 TAB  . omeprazole (PRILOSEC) 20 MG capsule Take 20 mg by mouth daily before breakfast.  03/05/2016: PATIENT CANNOT TAKE PROTONIX (IN-HOUSE)  . ondansetron (ZOFRAN) 8 MG tablet Take 1 tablet (8 mg total) by mouth every 8 (eight) hours as needed for nausea or vomiting. (Patient not taking: Reported on 12/05/2016)   . prochlorperazine (COMPAZINE) 10 MG tablet Take 1 tablet (10 mg total) by mouth every 6 (six) hours as needed for nausea or vomiting. (Patient not taking: Reported on 12/05/2016)   . XARELTO 20 MG TABS tablet Take 1 tablet (20 mg total) by mouth daily with supper.    No facility-administered encounter medications on file as of 01/03/2017.     ALLERGIES:  Allergies  Allergen Reactions  . Oxaliplatin Anaphylaxis  . Pantoprazole Other (See Comments)    THIS GIVES THE PATIENT TERRIBLE HEARTBURN (PLEASE DO NOT GIVE)  . Adhesive [Tape] Rash  . Aspirin Other (See Comments)    Burns stomach  . Neulasta [Pegfilgrastim] Other (See Comments)    UNSURE   . Oxycodone Nausea And Vomiting  . Tramadol Nausea And Vomiting      PHYSICAL EXAM:  ECOG Performance status: 1-2 - Symptomatic; remains largely independent, but requires occasional assistance    Physical Exam  Constitutional: She is oriented to person, place, and time.  -Thin frail female in no acute distress   HENT:  Head: Normocephalic.  Eyes: Conjunctivae are normal. No scleral icterus.  Neck: Normal range of motion. Neck supple.  Cardiovascular: Normal rate and regular rhythm.  Pulmonary/Chest: Effort normal and breath sounds normal. No respiratory distress. She has no wheezes. She has no rales.  Abdominal: Soft. Bowel sounds are normal. There is no tenderness.  +abdominal hernia  Musculoskeletal: Normal range of motion. She exhibits no edema.  2 cm mass in the left lateral chest wall, nontender.  Lymphadenopathy:    She has no cervical adenopathy.       Right: No supraclavicular adenopathy present.       Left: No supraclavicular adenopathy present.  Neurological: She is alert and oriented to person, place, and time. No cranial nerve deficit.  Skin: Skin is warm and dry. No rash noted.  Psychiatric: Mood, memory, affect and judgment normal.  Nursing note and vitals reviewed.    LABORATORY DATA:  I have reviewed the labs as listed.  CBC    Component Value Date/Time   WBC 3.4 (L) 12/13/2016 1028   RBC 2.90 (L) 12/13/2016 1028  HGB 8.8 (L) 12/13/2016 1028   HCT 28.4 (L) 12/13/2016 1028   PLT 199 12/13/2016 1028   MCV 97.9 12/13/2016 1028   MCH 30.3 12/13/2016 1028   MCHC 31.0 12/13/2016 1028   RDW 16.4 (H) 12/13/2016 1028   LYMPHSABS 1.4 12/13/2016 1028   MONOABS 0.4 12/13/2016 1028   EOSABS 0.0 12/13/2016 1028   BASOSABS 0.0 12/13/2016 1028   CMP Latest Ref Rng & Units 12/13/2016 12/06/2016 11/29/2016  Glucose 65 - 99 mg/dL 170(H) 76 95  BUN 6 - 20 mg/dL 13 21(H) 19  Creatinine 0.44 - 1.00 mg/dL 1.34(H) 1.31(H) 1.11(H)  Sodium 135 - 145 mmol/L 139 139 136  Potassium 3.5 - 5.1 mmol/L 3.1(L) 3.6 3.6  Chloride 101 - 111  mmol/L 110 109 109  CO2 22 - 32 mmol/L 20(L) 20(L) 19(L)  Calcium 8.9 - 10.3 mg/dL 8.5(L) 8.7(L) 8.8(L)  Total Protein 6.5 - 8.1 g/dL 6.3(L) 6.4(L) 6.6  Total Bilirubin 0.3 - 1.2 mg/dL 0.5 0.6 0.7  Alkaline Phos 38 - 126 U/L 103 119 127(H)  AST 15 - 41 U/L 35 18 50(H)  ALT 14 - 54 U/L 24 13(L) 26      PENDING LABS:    DIAGNOSTIC IMAGING:  *The following radiologic images and reports have been reviewed independently and agree with below findings.  CT chest/abd/pelvis: 07/26/16       CT head: 07/26/16       PATHOLOGY:  (L) lung wedge resection path: 04/20/15           ASSESSMENT & PLAN:   Stage IV adenocarcinoma of pancreas with lung mets: -Initially diagnosed in 08/2008. Treated with Whipple by Dr. Eugenia Pancoast at [facility]. She received adjuvant chemotherapy with Gemcitabine x 6 months, completing in 08/2009. Found to have metastatic lung disease and underwent wedge resection in 08/2009 for this metastatic disease. Underwent additional left lung resection of lesion felt to be lung primary malignancy with Dr. Arlyce Dice in 05/2010. Underwent SBRT to lung with Dr. Tammi Klippel, which completed on 09/04/11, followed by 5-FU based chemotherapy through 06/13/12. Then was treated with Gemzar/Abraxane for metastatic disease at [facility] in [place] from 02/13/13-05/20/13. Mild progression noted on scans in 06/2014. Gemzar/Abraxane was stopped d/t poor tolerance.  In 03/2015, patient had spontaneous (L) lung pneumothorax. Underwent (L) VATS and mini thoracotomy with path revealing high-grade carcinoma consistent with pancreatic primary. Progression of disease noted on imaging in 09/29/15 and chemotherapy with 5-FU/Leucovorin/Irinotecan started in 09/2015. Again, had spontaneous pneumothorax requiring chest tube placement on 03/05/16 with Dr. Cyndia Bent.  Chemotherapy was held as she recovered and resumed on 04/26/16. Progression of disease noted on CT imaging on 05/09/16,  which was likely secondary to being off of therapy x 2 months with pneumothorax.   -Patient is concerned that she does not know if continuing chemo is prolonging her life. I have told her that since she clinically feels well, I do not believe she likely has worsening progression, however I cannot tell for sure unless I repeat her restaging scans. However she just started her new treatment so it would be too early to rescan her. I have told her that if she stops chemo she will likely progress. After a long discussion, patient has decided to continue treatment. Proceed with cycle 2 of gemzar with erlotinib. -I have told her that her rash is likely secondary to erlotinib. It is grade 1 and barely visible at this time on her face.  -Plan to restage after cycle  3 of gemzar/erlotinib.  Chemo-induced anemia:  -On Aranesp every 2 weeks. Hgb 9.8 g/dL today, so Aranesp given today by nursing per protocol.  -Continue Aranesp every 2 weeks as scheduled.     Dispo:  -RTC in 4 weeks for follow up. Continue chemo as sheduled.  All questions were answered to patient's stated satisfaction. Encouraged patient to call with any new concerns or questions before her next visit to the cancer center and we can certain see her sooner, if needed.     Twana First, MD

## 2017-01-03 NOTE — Patient Instructions (Signed)
[facility] Discharge Instructions for Patients Receiving Chemotherapy   Beginning January 23rd 2017 lab work for the [facility] will be done in the  Main lab at [facility] on 1st floor. If you have a lab appointment with the [facility] please come in thru the  Main Entrance and check in at the main information desk   Today you received the following chemotherapy agents Gemzar. Follow-up as scheduled. Call clinic for any questions or concerns  To help prevent nausea and vomiting after your treatment, we encourage you to take your nausea medication   If you develop nausea and vomiting, or diarrhea that is not controlled by your medication, call the clinic.  The clinic phone number is [phone]. Office hours are Monday-Friday 8:30am-5:00pm.  BELOW ARE SYMPTOMS THAT SHOULD BE REPORTED IMMEDIATELY:  *FEVER GREATER THAN 101.0 F  *CHILLS WITH OR WITHOUT FEVER  NAUSEA AND VOMITING THAT IS NOT CONTROLLED WITH YOUR NAUSEA MEDICATION  *UNUSUAL SHORTNESS OF BREATH  *UNUSUAL BRUISING OR BLEEDING  TENDERNESS IN MOUTH AND THROAT WITH OR WITHOUT PRESENCE OF ULCERS  *URINARY PROBLEMS  *BOWEL PROBLEMS  UNUSUAL RASH Items with * indicate a potential emergency and should be followed up as soon as possible. If you have an emergency after office hours please contact your primary care physician or go to the nearest emergency department.  Please call the clinic during office hours if you have any questions or concerns.   You may also contact the Patient Navigator at [phone] should you have any questions or need assistance in obtaining follow up care.      Resources For Cancer Patients and their Caregivers ? American Cancer Society: Can assist with transportation, wigs, general needs, runs Look Good Feel Better.        732-123-3835 ? Cancer Care: Provides financial assistance, online support groups, medication/co-pay assistance.  1-800-813-HOPE  973-444-8368) ? [facility] Assists [place] cancer patients and their families through emotional , educational and financial support.  (515)888-7212 ? [place] DSS Where to apply for food stamps, Medicaid and utility assistance. 365-379-9449 ? [facility]: Transportation to medical appointments. 978-173-0862 ? Social Security Administration: May apply for disability if have a Stage IV cancer. 418-150-1034 561-214-7034 ? [employer], Disability and Transit Services: Assists with nutrition, care and transit needs. ([phone] 681-5414

## 2017-01-06 LAB — CANCER ANTIGEN 19-9: CAN 19-9: 2069 U/mL — AB (ref 0–35)

## 2017-01-10 ENCOUNTER — Ambulatory Visit (HOSPITAL_COMMUNITY): Payer: Medicare Other

## 2017-01-10 ENCOUNTER — Other Ambulatory Visit: Payer: Self-pay

## 2017-01-10 ENCOUNTER — Encounter (HOSPITAL_COMMUNITY): Payer: Self-pay | Admitting: Adult Health

## 2017-01-10 ENCOUNTER — Encounter (HOSPITAL_BASED_OUTPATIENT_CLINIC_OR_DEPARTMENT_OTHER): Payer: Medicare Other | Admitting: Adult Health

## 2017-01-10 ENCOUNTER — Encounter (HOSPITAL_BASED_OUTPATIENT_CLINIC_OR_DEPARTMENT_OTHER): Payer: Medicare Other

## 2017-01-10 VITALS — BP 149/50 | HR 53 | Temp 97.6°F | Resp 20

## 2017-01-10 VITALS — BP 167/66 | HR 62 | Temp 97.6°F | Resp 16 | Ht 60.0 in | Wt 118.5 lb

## 2017-01-10 DIAGNOSIS — C259 Malignant neoplasm of pancreas, unspecified: Secondary | ICD-10-CM | POA: Diagnosis not present

## 2017-01-10 DIAGNOSIS — Z5111 Encounter for antineoplastic chemotherapy: Secondary | ICD-10-CM

## 2017-01-10 DIAGNOSIS — D6481 Anemia due to antineoplastic chemotherapy: Secondary | ICD-10-CM | POA: Diagnosis not present

## 2017-01-10 DIAGNOSIS — R197 Diarrhea, unspecified: Secondary | ICD-10-CM

## 2017-01-10 DIAGNOSIS — C7800 Secondary malignant neoplasm of unspecified lung: Secondary | ICD-10-CM | POA: Diagnosis not present

## 2017-01-10 DIAGNOSIS — C7802 Secondary malignant neoplasm of left lung: Secondary | ICD-10-CM

## 2017-01-10 DIAGNOSIS — Z7189 Other specified counseling: Secondary | ICD-10-CM

## 2017-01-10 DIAGNOSIS — L538 Other specified erythematous conditions: Secondary | ICD-10-CM | POA: Diagnosis not present

## 2017-01-10 DIAGNOSIS — Z5189 Encounter for other specified aftercare: Secondary | ICD-10-CM

## 2017-01-10 DIAGNOSIS — E876 Hypokalemia: Secondary | ICD-10-CM | POA: Diagnosis not present

## 2017-01-10 DIAGNOSIS — R609 Edema, unspecified: Secondary | ICD-10-CM | POA: Diagnosis not present

## 2017-01-10 DIAGNOSIS — Z95828 Presence of other vascular implants and grafts: Secondary | ICD-10-CM | POA: Diagnosis not present

## 2017-01-10 LAB — CBC WITH DIFFERENTIAL/PLATELET
BASOS ABS: 0 10*3/uL (ref 0.0–0.1)
Basophils Relative: 0 %
Eosinophils Absolute: 0 10*3/uL (ref 0.0–0.7)
Eosinophils Relative: 0 %
HEMATOCRIT: 31.7 % — AB (ref 36.0–46.0)
Hemoglobin: 9.6 g/dL — ABNORMAL LOW (ref 12.0–15.0)
LYMPHS PCT: 37 %
Lymphs Abs: 1.5 10*3/uL (ref 0.7–4.0)
MCH: 30.2 pg (ref 26.0–34.0)
MCHC: 30.3 g/dL (ref 30.0–36.0)
MCV: 99.7 fL (ref 78.0–100.0)
MONO ABS: 0.7 10*3/uL (ref 0.1–1.0)
Monocytes Relative: 16 %
NEUTROS ABS: 2 10*3/uL (ref 1.7–7.7)
Neutrophils Relative %: 47 %
Platelets: 147 10*3/uL — ABNORMAL LOW (ref 150–400)
RBC: 3.18 MIL/uL — AB (ref 3.87–5.11)
RDW: 16.8 % — AB (ref 11.5–15.5)
WBC: 4.2 10*3/uL (ref 4.0–10.5)

## 2017-01-10 LAB — COMPREHENSIVE METABOLIC PANEL
ALBUMIN: 2.8 g/dL — AB (ref 3.5–5.0)
ALT: 44 U/L (ref 14–54)
ANION GAP: 8 (ref 5–15)
AST: 65 U/L — ABNORMAL HIGH (ref 15–41)
Alkaline Phosphatase: 125 U/L (ref 38–126)
BILIRUBIN TOTAL: 0.7 mg/dL (ref 0.3–1.2)
BUN: 12 mg/dL (ref 6–20)
CHLORIDE: 110 mmol/L (ref 101–111)
CO2: 20 mmol/L — AB (ref 22–32)
Calcium: 8.5 mg/dL — ABNORMAL LOW (ref 8.9–10.3)
Creatinine, Ser: 1.2 mg/dL — ABNORMAL HIGH (ref 0.44–1.00)
GFR calc Af Amer: 49 mL/min — ABNORMAL LOW (ref >60)
GFR calc non Af Amer: 42 mL/min — ABNORMAL LOW (ref >60)
GLUCOSE: 88 mg/dL (ref 65–99)
POTASSIUM: 3.4 mmol/L — AB (ref 3.5–5.1)
SODIUM: 138 mmol/L (ref 135–145)
TOTAL PROTEIN: 6.1 g/dL — AB (ref 6.5–8.1)

## 2017-01-10 MED ORDER — SODIUM CHLORIDE 0.9% FLUSH
10.0000 mL | INTRAVENOUS | Status: DC | PRN
  Administered 2017-01-10: 10 mL
  Filled 2017-01-10: qty 10

## 2017-01-10 MED ORDER — PEGFILGRASTIM 6 MG/0.6ML ~~LOC~~ PSKT
PREFILLED_SYRINGE | SUBCUTANEOUS | Status: AC
  Filled 2017-01-10: qty 0.6

## 2017-01-10 MED ORDER — DARBEPOETIN ALFA 500 MCG/ML IJ SOSY
500.0000 ug | PREFILLED_SYRINGE | Freq: Once | INTRAMUSCULAR | Status: AC
  Administered 2017-01-10: 500 ug via SUBCUTANEOUS
  Filled 2017-01-10: qty 1

## 2017-01-10 MED ORDER — DIPHENOXYLATE-ATROPINE 2.5-0.025 MG PO TABS: 1.0000 | ORAL_TABLET | Freq: Four times a day (QID) | ORAL | 0 refills | Status: DC | PRN

## 2017-01-10 MED ORDER — HEPARIN SOD (PORK) LOCK FLUSH 100 UNIT/ML IV SOLN
500.0000 [IU] | Freq: Once | INTRAVENOUS | Status: AC | PRN
  Administered 2017-01-10: 500 [IU]

## 2017-01-10 MED ORDER — PROCHLORPERAZINE MALEATE 10 MG PO TABS
ORAL_TABLET | ORAL | Status: AC
  Filled 2017-01-10: qty 1

## 2017-01-10 MED ORDER — GEMCITABINE HCL CHEMO INJECTION 1 GM/26.3ML
1000.0000 mg/m2 | Freq: Once | INTRAVENOUS | Status: AC
  Administered 2017-01-10: 1520 mg via INTRAVENOUS
  Filled 2017-01-10: qty 15.78

## 2017-01-10 MED ORDER — SODIUM CHLORIDE 0.9 % IV SOLN
Freq: Once | INTRAVENOUS | Status: AC
  Administered 2017-01-10: 12:00:00 via INTRAVENOUS

## 2017-01-10 MED ORDER — PROCHLORPERAZINE MALEATE 10 MG PO TABS
10.0000 mg | ORAL_TABLET | Freq: Once | ORAL | Status: AC
  Administered 2017-01-10: 10 mg via ORAL

## 2017-01-10 NOTE — Patient Instructions (Signed)
[facility] at [facility] Discharge Instructions  RECOMMENDATIONS MADE BY THE CONSULTANT AND ANY TEST RESULTS WILL BE SENT TO YOUR REFERRING PHYSICIAN.  You were seen today by Gretchen Dawson NP.   Thank you for choosing [facility] at [facility] to provide your oncology and hematology care.  To afford each patient quality time with our provider, please arrive at least 15 minutes before your scheduled appointment time.    If you have a lab appointment with the Cancer Center please come in thru the  Main Entrance and check in at the main information desk  You need to re-schedule your appointment should you arrive 10 or more minutes late.  We strive to give you quality time with our providers, and arriving late affects you and other patients whose appointments are after yours.  Also, if you no show three or more times for appointments you may be dismissed from the clinic at the providers discretion.     Again, thank you for choosing [facility].  Our hope is that these requests will decrease the amount of time that you wait before being seen by our physicians.       _____________________________________________________________  Should you have questions after your visit to [facility], please contact our office at [phone] between the hours of 8:30 a.m. and 4:30 p.m.  Voicemails left after 4:30 p.m. will not be returned until the following business day.  For prescription refill requests, have your pharmacy contact our office.       Resources For Cancer Patients and their Caregivers ? [employer]: Can assist with transportation, wigs, general needs, runs Look Good Feel Better.        1-888-227-6333 ? Cancer Care: Provides financial assistance, online support groups, medication/co-pay assistance.  1-800-813-HOPE (4673) ? [facility] Assists [place] cancer patients and  their families through emotional , educational and financial support.  336-427-4357 ? [place] DSS Where to apply for food stamps, Medicaid and utility assistance. 336-342-1394 ? [facility]: Transportation to medical appointments. 336-347-2287 ? Social Security Administration: May apply for disability if have a Stage IV cancer. 336-342-7796 1-800-772-1213 ? [place] [facility]: Assists with nutrition, care and transit needs. 336-349-2343  Cancer Center Support Programs: @[email or web address]@ > Cancer Support Group  2nd Tuesday of the month 1pm-2pm, Journey Room  > Creative Journey  3rd Tuesday of the month 1130am-1pm, Journey Room  > Look Good Feel Better  1st Wednesday of the month 10am-12 noon, Journey Room (Call [employer] to register 1-800-395-5775)    

## 2017-01-10 NOTE — Progress Notes (Signed)
[place], [place] [address]   CLINIC:  Medical Oncology/Hematology  PCP:  Monico Blitz, MD [place] [address] [phone]   REASON FOR VISIT:  Follow-up for Stage IV adenocarcinoma of pancreas with lung mets   CURRENT THERAPY: Gemzar days 1, 8, & 15 + Erlotinib 100 mg po daily, beginning 11/22/16 (palliative treatment)   BRIEF ONCOLOGIC HISTORY:    Pancreatic cancer metastasized to lung ([place])   09/03/2008 Surgery    Whipple with Dr. Birdie Sons for T3N1 3/12 LN+ Pancreatic Adenocarcinoma      11/13/2008 - 09/12/2009 Chemotherapy    Adjuvant Gemcitabine X 6 months      08/31/2009 Surgery    Metastectomy with Dr. Arlyce Dice RUL 2 nodules, RLL (wedge resection X 2)      08/31/2009 Pathology Results    Metastatic adenocarcinoma TTF-1 negative      06/03/2010 Surgery    Left video-assisted thoracoscopic surgery, resection of left lower lobe lesion. with Dr. Arlyce Dice      06/04/2010 Pathology Results    adenocarcinoma positive for cytokeratin 7, focally positive for CDX2, with scattered staining for TTF1 cytokeratin 20. [facility] opinion felt to be lung primary although noted to stain for GI markers, not felt to be pancreatic primary. EGFR and ALK negative      03/31/2011 - 09/04/2011 Radiation Therapy    SBRT LUL 18Gy Dr. Tammi Klippel      03/15/2012 - 06/13/2012 Chemotherapy    5-FU based chemotherapy      02/13/2013 - 05/20/2013 Chemotherapy    Gemzar Abraxane for metastatic disease (given to cover both primaries at [facility])      07/15/2014 PET scan    Mild progression of pulm mets, hypermet mesenteric tumor assoc with  SMA is stable to slightly increased in degree of FDG uptake      07/23/2014 Imaging         07/30/2014 - 09/10/2014 Chemotherapy    Gemzar abraxane stopped due to poor tolerance      04/06/2015 Procedure    L 20 French chest tube for spontaneous pneumothorax, by Dr. Tharon Aquas Trigt      04/20/2015  Pathology Results    High grade carcinoma c/w patient's known pancreatic primary (secondary opinion at [facility]) PDL-1 high expression, preserved major and minor MMR, MSI stable, [facility] ONE testing performed,       04/20/2015 Surgery    Left VATS, mini thoracotomy with stapling and oversewing of blebs, Talc pleurodesis. performed secondary to persistent air leak not improved with chest tube drainage      07/29/2015 Imaging    No signif change in appear of multifocal B cavitary pulmonary lesions, postop appearance upper abdomen, no findings of met disease to abd or pelvis,      09/29/2015 Imaging    Slight progression of multifocal cavitary pulmonary metastases, as discussed above. 2. Interval development of a lytic lesion in the antral lateral aspect of the left fifth rib where there is a nondisplaced pathologic fracture. No other definite osseous metastases are identified. 3. Prominent soft tissue adjacent to the proximal superior mesenteric artery and in the aortocaval nodal station, similar to prior examinations, likely to represent treated metastatic lymphadenopathy. 4. Epigastric ventral hernia containing several short loops of small bowel. There is a focally dilated loop of small bowel adjacent to this, however, this is an isolated finding. No other dilatation of small bowel or colon to suggest frank bowel obstruction at this  time.       10/19/2015 - 03/01/2016 Chemotherapy    The patient had palonosetron (ALOXI) injection 0.25 mg, 0.25 mg, Intravenous,  Once, 10 of 12 cycles  leucovorin 652 mg in dextrose 5 % 250 mL infusion, 400 mg/m2 = 652 mg, Intravenous,  Once, 10 of 12 cycles  fluorouracil (ADRUCIL) 2,950 mg in sodium chloride 0.9 % 91 mL chemo infusion, 1,800 mg/m2 = 2,950 mg (100 % of original dose 1,800 mg/m2), Intravenous, 1 Day/Dose, 10 of 12 cycles Dose modification: 1,920 mg/m2 (original dose 1,800 mg/m2, Cycle 1, Reason: Provider Judgment), 1,800  mg/m2 (original dose 1,800 mg/m2, Cycle 1, Reason: Provider Judgment), 1,500 mg/m2 (original dose 1,800 mg/m2, Cycle 2, Reason: Dose not tolerated)  irinotecan LIPOSOME (ONIVYDE) 55.9 mg in sodium chloride 0.9 % 500 mL chemo infusion, 35 mg/m2 = 55.9 mg (100 % of original dose 35 mg/m2), Intravenous, Once, 10 of 12 cycles Dose modification: 35 mg/m2 (original dose 35 mg/m2, Cycle 1, Reason: Provider Judgment)  for chemotherapy treatment.        01/12/2016 Imaging    CT CAP- 1. Cavitary lung metastases are stable to decreased in size. 2. Expansile mixed lytic and sclerotic bone metastasis in the anterior left fifth rib is increased in sclerosis and mildly increased in size. 3. Infiltrative soft tissue in the retroperitoneum abutting the SMA is stable. 4. No new sites of metastatic disease in the chest, abdomen or pelvis.      03/05/2016 Procedure    Chest tube placed for spontaneous pneumothorax by Dr. Cyndia Bent      03/05/2016 - 03/10/2016 Hospital Admission    Admit date: 03/05/2016 Admission diagnosis: Spontenous pneumothorax Additional comments: chest tube placed by Dr. Cyndia Bent on 03/05/2016      03/14/2016 - 03/18/2016 Hospital Admission    Admit date: 03/14/2016 Admission diagnosis: Recurrent spontaneous pneumothorax Additional comments:       04/26/2016 -  Chemotherapy    The patient had palonosetron (ALOXI) injection 0.25 mg, 0.25 mg, Intravenous,  Once, 10 of 12 cycles  leucovorin 652 mg in dextrose 5 % 250 mL infusion, 400 mg/m2 = 652 mg, Intravenous,  Once, 10 of 12 cycles  fluorouracil (ADRUCIL) 2,950 mg in sodium chloride 0.9 % 91 mL chemo infusion, 1,800 mg/m2 = 2,950 mg (100 % of original dose 1,800 mg/m2), Intravenous, 1 Day/Dose, 10 of 12 cycles Dose modification: 1,920 mg/m2 (original dose 1,800 mg/m2, Cycle 1, Reason: Provider Judgment), 1,800 mg/m2 (original dose 1,800 mg/m2, Cycle 1, Reason: Provider Judgment), 1,500 mg/m2 (original dose 1,800 mg/m2, Cycle 2, Reason:  Dose not tolerated)  irinotecan LIPOSOME (ONIVYDE) 55.9 mg in sodium chloride 0.9 % 500 mL chemo infusion, 35 mg/m2 = 55.9 mg (100 % of original dose 35 mg/m2), Intravenous, Once, 10 of 12 cycles Dose modification: 35 mg/m2 (original dose 35 mg/m2, Cycle 1, Reason: Provider Judgment)  for chemotherapy treatment.        05/09/2016 Imaging    CT CAP- CT CHEST IMPRESSION  1. Mild progression of pulmonary metastasis. Although the majority of cavitary lung lesions are primarily similar, there are new and increased small cavitary nodules. 2. Trace residual loculated inferolateral right pleural air. 3. No thoracic adenopathy. 4. Progression of anterior left fifth rib metastasis with soft tissue component surrounding.  CT ABDOMEN AND PELVIS IMPRESSION  1. Status post Whipple procedure, without locally recurrent disease. 2. Similar soft tissue fullness within the retroperitoneum. 3. Pneumobilia. Possible mild cirrhosis. 4. Similar small bowel containing ventral abdominal wall laxity. 5. Similar abdominal aortic  aneurysm.      05/09/2016 Progression    Progression of disease after being off therapy x 2 months.      07/26/2016 Imaging    CT CAP- 1. Stable appearance of pulmonary metastases. 2. No thoracic adenopathy. 3. 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 4. Status post Whipple procedure. 5. Similar soft tissue within the retroperitoneum involving the SMA and aorto caval space. 6. Pneumobilia and possible mild cirrhosis. 7. Ventral abdominal wall hernia containing nonobstructed loops of small bowel. 8. Abdominal aortic aneurysm measures 3.7 cm. Unchanged from previous exam. 9. Aortic Atherosclerosis (ICD10-I70.0) and Emphysema (ICD10-J43.9). Coronary artery calcifications noted.      07/26/2016 Imaging    CT head- 1. No metastatic disease or acute intracranial abnormality identified. 2. Recannulized right transverse sinus through right IJ  bulb thrombosis since the 2016 MRI. Small volume of residual chronic nonocclusive thrombus. 3. Mild for age chronic small vessel disease is unchanged since 2016.      11/10/2016 Imaging    CT C/A/P: IMPRESSION: CT CHEST IMPRESSION  1. Mild progression of pulmonary metastasis. 2. Slight enlargement of a  left fifth rib metastasis. 3. No thoracic adenopathy. 4.  Emphysema (ICD10-J43.9). 5. Coronary artery atherosclerosis. Aortic Atherosclerosis (ICD10-I70.0).  CT ABDOMEN AND PELVIS IMPRESSION  1. Status post Whipple procedure. No evidence of progressive disease within the abdomen. 2. Grossly similar soft tissue thickening about the origin of the superior mesenteric artery. 3. Probable cirrhosis. 4. Similar ventral abdominal wall hernia containing nonobstructive small bowel. 5. Similar infrarenal aortic ectasia.      11/10/2016 Progression    Mild progression of pulmonary metastasis and slight enlargement of (L) 5th rib mets.       11/22/2016 -  Chemotherapy    Gemzar days 1, 8, & 15 + Erlotinib 100 mg daily       11/22/2016 Treatment Plan Change    Gemzar days 1, 8, & 15 + Erlotinib 100 mg daily        INTERVAL HISTORY:  Ms. Goeden 76 y.o. female returns to cancer center for routine follow-up for metastatic pancreatic cancer.   Here today with her sister. Due for cycle #2, day 8 Gemzar today.   Overall, she tells me she has been feeling "pretty good."  Appetite 75%; energy levels 50%. They recently celebrated Thanksgiving with their family a little early "and I was able to eat real good!"    Remains on Tarceva daily; denies any missed doses and is taking as directed. Continues to have diarrhea, "which has always been there. It's not any worse."  Requests refill of Lomotil today.  She is concerned about bilateral leg/feet swelling, which has been worse in the past few weeks.  She also has a new rash to her legs (which remains) and her face (which resolved) in the  past 1 week. States that her hands are peeling and sore; she puts Vaseline and mittens on her hands at night to sleep, which is somewhat helpful.  She has 2 sores in her mouth to bilateral buccal mucosa/cheeks, "but they're not bad enough to need to take any medicines for it."    She shares with me that she has decided that she is going to go see Dr. Jacquiline Doe in [place], [place] for 2nd opinion and to transfer her care. She reports that she loves the staff here at [facility], but Dr. Jacquiline Doe treated her cancer for many years and she wants to get his opinion on what could  be done in terms of treatment for her.  She tells me that she has an appointment to see him on 02/08/17.   Overall, she feels ready for her next dose of chemotherapy today and wants to get treatment.       REVIEW OF SYSTEMS:  Review of Systems  Constitutional: Positive for fatigue. Negative for chills and fever.  HENT:   Positive for mouth sores.   Eyes: Negative.   Respiratory: Negative.   Cardiovascular: Positive for leg swelling.  Gastrointestinal: Positive for diarrhea. Negative for abdominal pain, blood in stool, constipation, nausea and vomiting.  Endocrine: Negative.   Genitourinary: Negative.    Musculoskeletal: Negative.   Skin: Positive for rash.  Neurological: Positive for numbness.  Hematological: Bruises/bleeds easily.  Psychiatric/Behavioral: Negative.      PAST MEDICAL/SURGICAL HISTORY:  Past Medical History:  Diagnosis Date  . Abdominal wall hernia   . Anemia   . Anemia due to antineoplastic chemotherapy 07/05/2016  . Aortic aneurysm ([facility])    small aortic aneurysm  . Arthritis    "little in my right hand" (03/10/2016)  . Cervical cancer ([place]) 1963  . COPD (chronic obstructive pulmonary disease) ([place])   . Emphysema   . GERD (gastroesophageal reflux disease)   . Goals of care, counseling/discussion 02/02/2016  . Hypercholesterolemia   . Hypertension   . Low serum vitamin B12 01/20/2016  .  Pancreatic adenocarcinoma [facility]) July 2010  . Pancreatic cancer metastasized to lung [facility]) 09/04/2015   Past Surgical History:  Procedure Laterality Date  . BREAST CYST EXCISION Right 1988  . BUNIONECTOMY Bilateral 2007  . CARDIAC CATHETERIZATION    . CATARACT EXTRACTION W/ INTRAOCULAR LENS  IMPLANT, BILATERAL Bilateral 2009  . CHOLECYSTECTOMY  July 2010   done w/ Whipple procedure  . IR GENERIC HISTORICAL  03/15/2016   IR GUIDED DRAIN W CATHETER PLACEMENT 03/15/2016 Greggory Keen, MD [facility]-INTERV RAD  . SKIN CANCER EXCISION Left    nasal fold  . STAPLING OF BLEBS Left 04/20/2015   Procedure: STAPLING OF BLEBS;  Surgeon: Ivin Poot, MD;  Location: [employer];  Service: Thoracic;  Laterality: Left;  . THYROID SURGERY     "had a growth taken off"  . TONSILLECTOMY  1943  . VAGINAL HYSTERECTOMY  1963   Cervical Cancer  . VIDEO ASSISTED THORACOSCOPY Left 04/20/2015   Procedure: VIDEO ASSISTED THORACOSCOPY;  Surgeon: Ivin Poot, MD;  Location: [place];  Service: Thoracic;  Laterality: Left;  . WHIPPLE PROCEDURE  July 2010   pancreatic adenocarcinoma     SOCIAL HISTORY:  Social History   Socioeconomic History  . Marital status: Widowed    Spouse name: Not on file  . Number of children: 0  . Years of education: Not on file  . Highest education level: Not on file  Social Needs  . Financial resource strain: Not on file  . Food insecurity - worry: Not on file  . Food insecurity - inability: Not on file  . Transportation needs - medical: Not on file  . Transportation needs - non-medical: Not on file  Occupational History    Employer: RETIRED  Tobacco Use  . Smoking status: Former Smoker    Packs/day: 1.00    Years: 30.00    Pack years: 30.00    Types: Cigarettes    Last attempt to quit: 02/29/1996    Years since quitting: 20.8  . Smokeless tobacco: Never Used  Substance and Sexual Activity  . Alcohol use: No  . Drug  use: No  . Sexual activity: No  Other Topics Concern  . Not  on file  Social History Narrative  . Not on file    FAMILY HISTORY:  Family History  Problem Relation Age of Onset  . Pancreatic cancer Brother   . Colon cancer Sister 23       12/2006 dx surgery and chemotherapy  . Ovarian cancer Sister 35       Ovarian ca,  surgery and chemotherapy  . Ovarian cancer Other   . Cancer Other        lung to brain    CURRENT MEDICATIONS:  Outpatient Encounter Medications as of 01/10/2017  Medication Sig Note  . acetaminophen (TYLENOL) 325 MG tablet Take 2 tablets (650 mg total) by mouth every 6 (six) hours as needed for mild pain (or Fever >/= 101).   Marland Kitchen amoxicillin (AMOXIL) 500 MG capsule TAKE ONE CAPSULE BY MOUTH THREE TIMES DAILY WITH FOOD   . cholecalciferol (VITAMIN D) 1000 units tablet Take 2,000 Units by mouth daily.   Marland Kitchen CREON 36000 units CPEP capsule TAKE 1 CAPSULE THREE TIMES A DAY WITH MEALS   . diphenoxylate-atropine (LOMOTIL) 2.5-0.025 MG tablet Take 1 tablet by mouth 4 (four) times daily as needed for diarrhea or loose stools.   . erlotinib (TARCEVA) 100 MG tablet Take 1 tablet (100 mg total) by mouth daily. Take on an empty stomach 1 hour before meals or 2 hours after   . feeding supplement, ENSURE ENLIVE, (ENSURE ENLIVE) LIQD Take 237 mLs by mouth 2 (two) times daily between meals.   . Gemcitabine HCl (GEMZAR IV) Inject into the vein.   . hydrochlorothiazide (HYDRODIURIL) 25 MG tablet Take 12.5 mg by mouth every morning.  03/14/2016: 1/2 TAB  . ibuprofen (ADVIL,MOTRIN) 400 MG tablet Take 1 tablet (400 mg total) by mouth every 6 (six) hours as needed for mild pain.   Marland Kitchen lidocaine (XYLOCAINE) 2 % solution Use as directed 20 mLs in the mouth or throat as needed for mouth pain.   Marland Kitchen loperamide (IMODIUM A-D) 2 MG tablet At the onset of diarrhea, take 2 tabs. Then take 1 tab every 2 hours until you have gone 12 hours without having a loose stool. If it is bedtime and you have a loose stool(s) take 2 tabs every 4 hours until morning. Call [facility].   Marland Kitchen losartan (COZAAR) 100 MG tablet Take 100 mg by mouth every morning.    . magic mouthwash w/lidocaine SOLN Take 5 mLs by mouth 4 (four) times daily as needed for mouth pain.   . metoprolol succinate (TOPROL-XL) 50 MG 24 hr tablet Take 25 mg by mouth every morning. Take with or immediately following a meal.  03/14/2016: 1/2 TAB  . omeprazole (PRILOSEC) 20 MG capsule Take 20 mg by mouth daily before breakfast.  03/05/2016: PATIENT CANNOT TAKE PROTONIX (IN-HOUSE)  . ondansetron (ZOFRAN) 8 MG tablet Take 1 tablet (8 mg total) by mouth every 8 (eight) hours as needed for nausea or vomiting.   . prochlorperazine (COMPAZINE) 10 MG tablet Take 1 tablet (10 mg total) by mouth every 6 (six) hours as needed for nausea or vomiting.   Alveda Reasons 20 MG TABS tablet Take 1 tablet (20 mg total) by mouth daily with supper.   . [DISCONTINUED] diphenoxylate-atropine (LOMOTIL) 2.5-0.025 MG tablet Take 1 tablet 4 (four) times daily as needed by mouth for diarrhea or loose stools.    No facility-administered encounter medications on file as of 01/10/2017.  ALLERGIES:  Allergies  Allergen Reactions  . Oxaliplatin Anaphylaxis  . Pantoprazole Other (See Comments)    THIS GIVES THE PATIENT TERRIBLE HEARTBURN (PLEASE DO NOT GIVE)  . Adhesive [Tape] Rash  . Aspirin Other (See Comments)    Burns stomach  . Neulasta [Pegfilgrastim] Other (See Comments)    UNSURE   . Oxycodone Nausea And Vomiting  . Tramadol Nausea And Vomiting     PHYSICAL EXAM:  ECOG Performance status: 1-2 - Symptomatic; remains largely independent, but requires occasional assistance    Physical Exam  Constitutional: She is oriented to person, place, and time.  Thin frail female in no acute distress   HENT:  Head: Normocephalic.  Mouth/Throat: Oropharynx is clear and moist.  Eyes: Conjunctivae are normal. Pupils are equal, round, and reactive to light. No scleral icterus.  Neck: Normal range of motion. Neck supple.   Cardiovascular: Normal rate and regular rhythm.  Pulmonary/Chest: Effort normal and breath sounds normal. No respiratory distress. She has no wheezes.  Abdominal: Soft. Bowel sounds are normal. There is no tenderness.  Wearing abdominal binder; "It helps with my rib pain where the cancer is."   Musculoskeletal: Normal range of motion. She exhibits edema (Trace ankle/feet edema).  Lymphadenopathy:    She has no cervical adenopathy.  Neurological: She is alert and oriented to person, place, and time. No cranial nerve deficit.  Skin: Skin is warm and dry. Rash (Mild maculopapular rash to bilat lower legs; skin is very dry. ) noted. There is erythema.  -Mild palmar erythema bilat. No ulcerations or open wounds. Mild tenderness to palmar surfaces of hands. No overt redness or ulcerations to soles of feet.  -Overall skin is very dry.   Psychiatric: Mood, memory, affect and judgment normal.  Nursing note and vitals reviewed.    LABORATORY DATA:  I have reviewed the labs as listed.  CBC    Component Value Date/Time   WBC 4.2 01/10/2017 1114   RBC 3.18 (L) 01/10/2017 1114   HGB 9.6 (L) 01/10/2017 1114   HCT 31.7 (L) 01/10/2017 1114   PLT 147 (L) 01/10/2017 1114   MCV 99.7 01/10/2017 1114   MCH 30.2 01/10/2017 1114   MCHC 30.3 01/10/2017 1114   RDW 16.8 (H) 01/10/2017 1114   LYMPHSABS 1.5 01/10/2017 1114   MONOABS 0.7 01/10/2017 1114   EOSABS 0.0 01/10/2017 1114   BASOSABS 0.0 01/10/2017 1114   CMP Latest Ref Rng & Units 01/10/2017 01/03/2017 12/13/2016  Glucose 65 - 99 mg/dL 88 99 170(H)  BUN 6 - 20 mg/dL _0 Creatinine 0.44 - 1.00 mg/dL 1.20(H) 1.17(H) 1.34(H)  Sodium 135 - 145 mmol/L 138 139 139  Potassium 3.5 - 5.1 mmol/L 3.4(L) 4.0 3.1(L)  Chloride 101 - 111 mmol/L 110 113(H) 110  CO2 22 - 32 mmol/L 20(L) 20(L) 20(L)  Calcium 8.9 - 10.3 mg/dL 8.5(L) 8.4(L) 8.5(L)  Total Protein 6.5 - 8.1 g/dL 6.1(L) 5.7(L) 6.3(L)  Total Bilirubin 0.3 - 1.2 mg/dL 0.7 0.6 0.5  Alkaline  Phos 38 - 126 U/L 125 108 103  AST 15 - 41 U/L 65(H) 26 35  ALT 14 - 54 U/L 44 17 24        PENDING LABS:    DIAGNOSTIC IMAGING:  *The following radiologic images and reports have been reviewed independently and agree with below findings.  CT chest/abd/pelvis: 11/10/16 CLINICAL DATA:  Pancreatic cancer with metastasis to lung. Whipple procedure in 2010. Status post radiation therapy. Ongoing chemotherapy. Status post lobectomy. Hysterectomy.  Remote history of cervical cancer.  EXAM: CT CHEST, ABDOMEN, AND PELVIS WITH CONTRAST  TECHNIQUE: Multidetector CT imaging of the chest, abdomen and pelvis was performed following the standard protocol during bolus administration of intravenous contrast.  CONTRAST:  81m ISOVUE-300 IOPAMIDOL (ISOVUE-300) INJECTION 61%  COMPARISON:  07/26/2016  FINDINGS: CT CHEST FINDINGS  Cardiovascular: A right Port-A-Cath which terminates at the low right atrium. Advanced aortic and branch vessel atherosclerosis. Tortuous thoracic aorta. Mild cardiomegaly, without pericardial effusion. Multivessel coronary artery atherosclerosis. No central pulmonary embolism, on this non-dedicated study.  Mediastinum/Nodes: No supraclavicular adenopathy. No mediastinal or hilar adenopathy.  Lungs/Pleura: Minimal right-sided pleural fluid or thickening, similar. Left-sided pleural foci of increased density could represent calcification or the sequelae of prior pleurodesis. Advanced bullous type emphysema.  Posterior right upper lobe scarring.  Subpleural right upper lobe pulmonary nodule is similar at 5 mm on image 43/series 4.  Partially cavitary right lower lobe pulmonary nodule measures 2.0 cm on image 128/series 4 versus 1.2 cm on the prior (when remeasured).  More posterior and cephalad right lower lobe partially cavitary nodule on image 122/series 4 is also progressive.  Right lower lobe lung mass with partial cavitation. 6.1 x 3.7  cm today versus 5.8 x 3.6 cm on the prior.  Subpleural possibly radiation induced scarring in the left upper lobe. Left lower lobe consolidation with bronchiectasis is likely due to scarring.  Subpleural left lower lobe density measures 11 mm on image 109/ series 2. Compare 6 mm on the prior.  Musculoskeletal: Osteopenia. 5th left rib metastasis with soft tissue component. Combination of osseous and soft tissue density measures 3.6 cm on image 39/series 2 versus 3.2 cm at the same level on the prior (when remeasured).  CT ABDOMEN PELVIS FINDINGS  Hepatobiliary: Suspect mild cirrhosis. No focal liver lesion. Pneumobilia. Cholecystectomy.  Pancreas: Whipple procedure. Air within the pancreatic duct. Atrophy throughout the remaining pancreas with mild duct dilatation, similar. No acute pancreatitis.  Spleen: Normal in size, without focal abnormality.  Adrenals/Urinary Tract: Right adrenal nodularity is similar 1.6 cm. Left adrenal thickening is not significantly changed. Mild renal cortical thinning bilaterally. Lower pole left renal cyst or minimally complex cyst. No hydronephrosis. Normal urinary bladder.  Stomach/Bowel: Normal stomach, without wall thickening. Normal colon and terminal ileum. Normal small bowel caliber. Again identified is a small bowel containing ventral abdominal wall hernia.  Vascular/Lymphatic: Advanced aortic and branch vessel atherosclerosis. Infrarenal abdominal aortic ectasia including at 3.6 cm. Patent portal vein and splenic vein. Soft tissue thickening adjacent the origin of the SMA, including on image 64/series 2. Grossly similar. No pelvic sidewall adenopathy.  Reproductive: Hysterectomy.  No adnexal mass.  Other: No significant free fluid. Moderate pelvic floor laxity. No evidence of omental or peritoneal disease.  Musculoskeletal: Lipoma within the right by musculature is unchanged including at 5.0 cm.  Osteopenia.  IMPRESSION: CT CHEST IMPRESSION  1. Mild progression of pulmonary metastasis. 2. Slight enlargement of a  left fifth rib metastasis. 3. No thoracic adenopathy. 4.  Emphysema (ICD10-J43.9). 5. Coronary artery atherosclerosis. Aortic Atherosclerosis (ICD10-I70.0).  CT ABDOMEN AND PELVIS IMPRESSION  1. Status post Whipple procedure. No evidence of progressive disease within the abdomen. 2. Grossly similar soft tissue thickening about the origin of the superior mesenteric artery. 3. Probable cirrhosis. 4. Similar ventral abdominal wall hernia containing nonobstructive small bowel. 5. Similar infrarenal aortic ectasia.   Electronically Signed   By: KAbigail MiyamotoM.D.   On: 11/11/2016 08:47     PATHOLOGY:  (L) lung  wedge resection path: 04/20/15           ASSESSMENT & PLAN:   Stage IV adenocarcinoma of pancreas with lung mets: -Initially diagnosed in 08/2008. Treated with Whipple by Dr. Eugenia Pancoast at [facility]. She received adjuvant chemotherapy with Gemcitabine x 6 months, completing in 08/2009. Found to have metastatic lung disease and underwent wedge resection in 08/2009 for this metastatic disease. Underwent additional left lung resection of lesion felt to be lung primary malignancy with Dr. Arlyce Dice in 05/2010. Underwent SBRT to lung with Dr. Tammi Klippel, which completed on 09/04/11, followed by 5-FU based chemotherapy through 06/13/12. Then was treated with Gemzar/Abraxane for metastatic disease at [facility] in [place] from 02/13/13-05/20/13. Mild progression noted on scans in 06/2014. Gemzar/Abraxane was stopped d/t poor tolerance.  In 03/2015, patient had spontaneous (L) lung pneumothorax. Underwent (L) VATS and mini thoracotomy with path revealing high-grade carcinoma consistent with pancreatic primary. Progression of disease noted on imaging in 09/29/15 and chemotherapy with 5-FU/Leucovorin/Irinotecan started in 09/2015. Again,  had spontaneous pneumothorax requiring chest tube placement on 03/05/16 with Dr. Cyndia Bent.  Chemotherapy was held as she recovered and resumed on 04/26/16. Progression of disease noted on CT imaging on 05/09/16, which was likely secondary to being off of therapy x 2 months with pneumothorax. Treatment with 5-FU/leucovorin/Liposomal irinotecan resumed in 04/2016.  Restaging scans in 10/2016 demonstrated progression of disease to pulmonary mets and rib met.  Treatment was changed to Gemzar on days 1, 8, & 15 with Tarceva po daily on 11/22/16.    -Most recent CA 19-9 markedly elevated at 2,069 on 01/03/17. Prior CA 19-9 was 1,203 on 11/22/16.  Reviewed these results with patient/family. Expressed my concerns that her cancer may be continuing to progress despite her current treatment.  We had goals of care discussion today as well (see below). She is not ready to stop treatment at this time and clinically overall still feels very well.  She understands that her cancer is not curable, but may still be treatable.  -Here today for cycle #2, day 8 Gemzar. Labs reviewed and are adequate for treatment today. -She shared with me that she is planning to transfer her care/seek 2nd opinion from Dr. Jacquiline Doe in [place], [place] on 02/08/17; she already has this visit scheduled.  She shares with me that since Dr. Jacquiline Doe treated her cancer initially, she wants to get his opinion on what else, if anything, can be done. This is certainly reasonable and we will help facilitate her transfer of care/2nd opinion as needed.   -Since she is planning to transfer her care, she will complete her current cycle of therapy on 01/17/17. Recommended she continue the daily Tarceva until she sees Dr. Jacquiline Doe on 02/08/17.  We will defer re-imaging scans, further work-up, and subsequent treatment planning to Dr. Vedia Coffer as he deems clinically appropriate.   -Tentatively no further follow-up to be scheduled at this time. Of course, I have encouraged  Ms. Vanevery to reach out to [place] with any questions or concerns in the interim. She agrees with this plan.   Diarrhea:  -Likely multifactorial given chemo and pancreatic insufficiency.  -Lomotil refilled today; paper prescription faxed to her specialty pharmacy per her request.   Hypokalemia:  -K mildly low at 3.4 today; likely secondary to diarrhea.  -Will replete with 40 mEq po K-Dur today during treatment. (labs reviewed and K supp was prescribed by Dr. Talbert Cage).   Lower extremity edema and mild palmar erythema:  -Likely secondary  to Tarceva.  Both grade 1.  -Encouraged her to continue diuretics as prescribed (reports that she is currently only taking 1/2 tab diuretic).  Keep feet elevated as tolerated.   -Encouraged her to continue to apply lotions/creams to her hands/feet often.  No erythema to soles of feet. If worsening symptoms, encouraged her to contact us as she may require dose-reduction or temporarily stopping treatment until symptoms improve. However, will continue current Tarceva dose for now.     Chemo-induced anemia:  -On Aranesp every 2 weeks. Hgb 9.6 g/dL today, so Aranesp given today by nursing per protocol.   Goals of care discussion:  -Spoke with Sharmane and her sister re: my clinical concerns that her disease may be progressing (given marked elevation of CA 19-9 in the past month) despite recent change in her treatments to Gemzar/Tarceva.  Clinically, she tells me that "I still feel pretty good most days."  She understands that in oncology there generally comes a time with either 1) the disease continues to progress despite our best efforts to control the cancer and/or 2) the patient's body becomes to weak or side effects too severe to continue treatment any further.  Some patients elect to stop treatment based on their own goals to enjoy improved quality of life without specific focus on treating the cancer.  Given that Ms. Runquist clinically still feels well, she is interested  in considering more treatment. She does not feel ready to stop treatment, which is reasonable right now.  She understands that there will likely come a time when no further treatment would be appropriate or available to her.  She and her sister seem to have realistic expectations of Lilu's care and goals.  "We know she may be getting worse, but we're grasping for straws."  I offered my support for her decision to pursue 2nd opinion and transfer her care back to Dr. Jacquiline Doe who treated her cancer for several years.  I shared with her that often times patients need the peace of mind and feeling that they have pursued additional options available.  We certainly want to support Chayna in whatever ways support her goals/wishes.  She voiced appreciation.  It has been an honor to participate in Disaya's care.          Dispo:  -Continue/complete current cycle of therapy.  -Patient is transferring her care to Dr. Jacquiline Doe in [place] in mid-December. We will defer any additional imaging, evaluation, and treatment recommendations to his team.  We will remain on "standby" should the patient elect to return to [facility] in the future.  I wished her well and encouraged her to call us with any questions/concerns in the coming weeks.      All questions were answered to patient's stated satisfaction. Encouraged patient to call with any new concerns or questions before her next visit to the cancer center and we can certain see her sooner, if needed.    Plan of care discussed with Dr. Talbert Cage, who agrees with the above aforementioned.     Orders placed this encounter:  No orders of the defined types were placed in this encounter.     Mike Craze, NP [place] [phone]

## 2017-01-10 NOTE — Patient Instructions (Signed)
[facility] Discharge Instructions for Patients Receiving Chemotherapy   Beginning January 23rd 2017 lab work for the [facility] will be done in the  Main lab at [facility] on 1st floor. If you have a lab appointment with the [facility] please come in thru the  Main Entrance and check in at the main information desk   Today you received the following chemotherapy agents Gemzar as well as Aranesp injection. Follow-up as scheduled. Call clinic for any questions or concerns  To help prevent nausea and vomiting after your treatment, we encourage you to take your nausea medication   If you develop nausea and vomiting, or diarrhea that is not controlled by your medication, call the clinic.  The clinic phone number is [phone]. Office hours are Monday-Friday 8:30am-5:00pm.  BELOW ARE SYMPTOMS THAT SHOULD BE REPORTED IMMEDIATELY:  *FEVER GREATER THAN 101.0 F  *CHILLS WITH OR WITHOUT FEVER  NAUSEA AND VOMITING THAT IS NOT CONTROLLED WITH YOUR NAUSEA MEDICATION  *UNUSUAL SHORTNESS OF BREATH  *UNUSUAL BRUISING OR BLEEDING  TENDERNESS IN MOUTH AND THROAT WITH OR WITHOUT PRESENCE OF ULCERS  *URINARY PROBLEMS  *BOWEL PROBLEMS  UNUSUAL RASH Items with * indicate a potential emergency and should be followed up as soon as possible. If you have an emergency after office hours please contact your primary care physician or go to the nearest emergency department.  Please call the clinic during office hours if you have any questions or concerns.   You may also contact the Patient Navigator at [phone] should you have any questions or need assistance in obtaining follow up care.      Resources For Cancer Patients and their Caregivers ? American Cancer Society: Can assist with transportation, wigs, general needs, runs Look Good Feel Better.        762 311 9756 ? Cancer Care: Provides financial assistance, online support groups, medication/co-pay  assistance.  1-800-813-HOPE (385) 037-0533) ? [place] Assists [place] cancer patients and their families through emotional , educational and financial support.  720 586 5111 ? [place] DSS Where to apply for food stamps, Medicaid and utility assistance. 579-424-2520 ? [employer]: Transportation to medical appointments. (703) 508-7283 ? Social Security Administration: May apply for disability if have a Stage IV cancer. 505-720-5558 413-276-4711 ? [employer], Disability and Transit Services: Assists with nutrition, care and transit needs. [phone]

## 2017-01-10 NOTE — Progress Notes (Signed)
[name] [name] tolerated Gemzar infusion and Aranesp injection well without complaints or incident. Labs reviewed with Dr. Talbert Cage prior to administering these medications. Hgb 9.6. VSS Pt discharged self ambulatory in satisfactory condition accompanied by her sister

## 2017-01-17 ENCOUNTER — Encounter (HOSPITAL_BASED_OUTPATIENT_CLINIC_OR_DEPARTMENT_OTHER): Payer: Medicare Other

## 2017-01-17 ENCOUNTER — Encounter (HOSPITAL_COMMUNITY): Payer: Self-pay

## 2017-01-17 DIAGNOSIS — C7800 Secondary malignant neoplasm of unspecified lung: Principal | ICD-10-CM

## 2017-01-17 DIAGNOSIS — C259 Malignant neoplasm of pancreas, unspecified: Secondary | ICD-10-CM

## 2017-01-17 DIAGNOSIS — R197 Diarrhea, unspecified: Secondary | ICD-10-CM | POA: Diagnosis not present

## 2017-01-17 DIAGNOSIS — Z452 Encounter for adjustment and management of vascular access device: Secondary | ICD-10-CM | POA: Diagnosis present

## 2017-01-17 DIAGNOSIS — C7802 Secondary malignant neoplasm of left lung: Secondary | ICD-10-CM | POA: Diagnosis not present

## 2017-01-17 DIAGNOSIS — E876 Hypokalemia: Secondary | ICD-10-CM

## 2017-01-17 DIAGNOSIS — Z95828 Presence of other vascular implants and grafts: Secondary | ICD-10-CM | POA: Diagnosis not present

## 2017-01-17 LAB — CBC WITH DIFFERENTIAL/PLATELET
BASOS PCT: 1 %
Basophils Absolute: 0 10*3/uL (ref 0.0–0.1)
EOS ABS: 0 10*3/uL (ref 0.0–0.7)
Eosinophils Relative: 0 %
HCT: 28.3 % — ABNORMAL LOW (ref 36.0–46.0)
Hemoglobin: 8.8 g/dL — ABNORMAL LOW (ref 12.0–15.0)
Lymphocytes Relative: 34 %
Lymphs Abs: 1.1 10*3/uL (ref 0.7–4.0)
MCH: 30.4 pg (ref 26.0–34.0)
MCHC: 31.1 g/dL (ref 30.0–36.0)
MCV: 97.9 fL (ref 78.0–100.0)
MONO ABS: 0.2 10*3/uL (ref 0.1–1.0)
MONOS PCT: 7 %
NEUTROS PCT: 59 %
Neutro Abs: 1.9 10*3/uL (ref 1.7–7.7)
Platelets: 62 10*3/uL — ABNORMAL LOW (ref 150–400)
RBC: 2.89 MIL/uL — ABNORMAL LOW (ref 3.87–5.11)
RDW: 16.6 % — AB (ref 11.5–15.5)
WBC: 3.2 10*3/uL — ABNORMAL LOW (ref 4.0–10.5)

## 2017-01-17 LAB — COMPREHENSIVE METABOLIC PANEL
ALBUMIN: 2.8 g/dL — AB (ref 3.5–5.0)
ALT: 45 U/L (ref 14–54)
ANION GAP: 8 (ref 5–15)
AST: 68 U/L — ABNORMAL HIGH (ref 15–41)
Alkaline Phosphatase: 121 U/L (ref 38–126)
BUN: 18 mg/dL (ref 6–20)
CHLORIDE: 106 mmol/L (ref 101–111)
CO2: 24 mmol/L (ref 22–32)
Calcium: 8.7 mg/dL — ABNORMAL LOW (ref 8.9–10.3)
Creatinine, Ser: 1.21 mg/dL — ABNORMAL HIGH (ref 0.44–1.00)
GFR calc Af Amer: 48 mL/min — ABNORMAL LOW (ref >60)
GFR calc non Af Amer: 42 mL/min — ABNORMAL LOW (ref >60)
GLUCOSE: 106 mg/dL — AB (ref 65–99)
POTASSIUM: 3.1 mmol/L — AB (ref 3.5–5.1)
Sodium: 138 mmol/L (ref 135–145)
Total Bilirubin: 0.7 mg/dL (ref 0.3–1.2)
Total Protein: 5.9 g/dL — ABNORMAL LOW (ref 6.5–8.1)

## 2017-01-17 MED ORDER — SODIUM CHLORIDE 0.9% FLUSH
20.0000 mL | INTRAVENOUS | Status: DC | PRN
  Administered 2017-01-17: 20 mL via INTRAVENOUS
  Filled 2017-01-17: qty 20

## 2017-01-17 MED ORDER — HEPARIN SOD (PORK) LOCK FLUSH 100 UNIT/ML IV SOLN
500.0000 [IU] | Freq: Once | INTRAVENOUS | Status: AC
  Administered 2017-01-17: 500 [IU] via INTRAVENOUS

## 2017-01-17 MED ORDER — HEPARIN SOD (PORK) LOCK FLUSH 100 UNIT/ML IV SOLN
INTRAVENOUS | Status: AC
  Filled 2017-01-17: qty 5

## 2017-01-17 NOTE — Patient Instructions (Signed)
[place] at [facility] Discharge Instructions  RECOMMENDATIONS MADE BY THE CONSULTANT AND ANY TEST RESULTS WILL BE SENT TO YOUR REFERRING PHYSICIAN.  Labs drawn from portacath today with flush per protocol. Follow-up as scheduled. Call clinic for any questions or concerns  Thank you for choosing [place] [facility] to provide your oncology and hematology care.  To afford each patient quality time with our provider, please arrive at least 15 minutes before your scheduled appointment time.    If you have a lab appointment with the [facility] please come in thru the  Main Entrance and check in at the main information desk  You need to re-schedule your appointment should you arrive 10 or more minutes late.  We strive to give you quality time with our providers, and arriving late affects you and other patients whose appointments are after yours.  Also, if you no show three or more times for appointments you may be dismissed from the clinic at the providers discretion.     Again, thank you for choosing [facility].  Our hope is that these requests will decrease the amount of time that you wait before being seen by our physicians.       _____________________________________________________________  Should you have questions after your visit to [facility], please contact our office at [phone] between the hours of 8:30 a.m. and 4:30 p.m.  Voicemails left after 4:30 p.m. will not be returned until the following business day.  For prescription refill requests, have your pharmacy contact our office.       Resources For Cancer Patients and their Caregivers ? American Cancer Society: Can assist with transportation, wigs, general needs, runs Look Good Feel Better.        336-603-5661 ? Cancer Care: Provides financial assistance, online support groups, medication/co-pay assistance.  1-800-813-HOPE  (347) 176-5026) ? [place] Assists [place] cancer patients and their families through emotional , educational and financial support.  313-767-3940 ? [place] DSS Where to apply for food stamps, Medicaid and utility assistance. (253)123-9985 ? RC[employer]: Transportation to medical appointments. 587-882-3955 ? Social Security Administration: May apply for disability if have a Stage IV cancer. 8133072851 857-665-5247 ? [employer], Disability and Transit Services: Assists with nutrition, care and transit needs. [place] Support Programs: @10RELATIVEDAYS @ > Cancer Support Group  2nd Tuesday of the month 1pm-2pm, Journey Room  > Creative Journey  3rd Tuesday of the month 1130am-1pm, Journey Room  > Look Good Feel Better  1st Wednesday of the month 10am-12 noon, Journey Room (Call [place] to register [phone])

## 2017-01-17 NOTE — Progress Notes (Signed)
Labs reviewed with Dr. Talbert Cage and pt's Gemzar infusion to be held today due to low platelets and rescheduled for next week per MD. Pt tolerated port lab draw with flush well without complaints or incident. VSS Reviewed above information with the pt who verbalized understanding. Pt discharged self ambulatory in satisfactory condition accompanied by her sister

## 2017-01-24 ENCOUNTER — Encounter (HOSPITAL_COMMUNITY): Payer: Self-pay

## 2017-01-24 ENCOUNTER — Other Ambulatory Visit (HOSPITAL_COMMUNITY): Payer: Self-pay | Admitting: Adult Health

## 2017-01-24 ENCOUNTER — Encounter (HOSPITAL_COMMUNITY): Payer: Medicare Other | Attending: Adult Health

## 2017-01-24 VITALS — BP 115/53 | HR 57 | Temp 97.7°F | Resp 18 | Wt 119.8 lb

## 2017-01-24 DIAGNOSIS — R197 Diarrhea, unspecified: Secondary | ICD-10-CM | POA: Insufficient documentation

## 2017-01-24 DIAGNOSIS — Z5111 Encounter for antineoplastic chemotherapy: Secondary | ICD-10-CM

## 2017-01-24 DIAGNOSIS — C259 Malignant neoplasm of pancreas, unspecified: Secondary | ICD-10-CM

## 2017-01-24 DIAGNOSIS — C7802 Secondary malignant neoplasm of left lung: Secondary | ICD-10-CM | POA: Diagnosis not present

## 2017-01-24 DIAGNOSIS — Z95828 Presence of other vascular implants and grafts: Secondary | ICD-10-CM | POA: Diagnosis not present

## 2017-01-24 DIAGNOSIS — C7800 Secondary malignant neoplasm of unspecified lung: Secondary | ICD-10-CM | POA: Diagnosis not present

## 2017-01-24 LAB — COMPREHENSIVE METABOLIC PANEL
ALBUMIN: 2.8 g/dL — AB (ref 3.5–5.0)
ALK PHOS: 111 U/L (ref 38–126)
ALT: 19 U/L (ref 14–54)
AST: 28 U/L (ref 15–41)
Anion gap: 6 (ref 5–15)
BUN: 22 mg/dL — ABNORMAL HIGH (ref 6–20)
CHLORIDE: 113 mmol/L — AB (ref 101–111)
CO2: 20 mmol/L — AB (ref 22–32)
CREATININE: 1.73 mg/dL — AB (ref 0.44–1.00)
Calcium: 8.3 mg/dL — ABNORMAL LOW (ref 8.9–10.3)
GFR calc Af Amer: 31 mL/min — ABNORMAL LOW (ref >60)
GFR calc non Af Amer: 27 mL/min — ABNORMAL LOW (ref >60)
GLUCOSE: 87 mg/dL (ref 65–99)
Potassium: 3.9 mmol/L (ref 3.5–5.1)
SODIUM: 139 mmol/L (ref 135–145)
Total Bilirubin: 0.5 mg/dL (ref 0.3–1.2)
Total Protein: 5.7 g/dL — ABNORMAL LOW (ref 6.5–8.1)

## 2017-01-24 LAB — CBC WITH DIFFERENTIAL/PLATELET
Basophils Absolute: 0 10*3/uL (ref 0.0–0.1)
Basophils Relative: 0 %
Eosinophils Absolute: 0.1 10*3/uL (ref 0.0–0.7)
Eosinophils Relative: 2 %
HEMATOCRIT: 30.4 % — AB (ref 36.0–46.0)
HEMOGLOBIN: 9.2 g/dL — AB (ref 12.0–15.0)
LYMPHS ABS: 1 10*3/uL (ref 0.7–4.0)
Lymphocytes Relative: 16 %
MCH: 30.6 pg (ref 26.0–34.0)
MCHC: 30.3 g/dL (ref 30.0–36.0)
MCV: 101 fL — ABNORMAL HIGH (ref 78.0–100.0)
MONO ABS: 0.6 10*3/uL (ref 0.1–1.0)
MONOS PCT: 10 %
NEUTROS ABS: 4.4 10*3/uL (ref 1.7–7.7)
NEUTROS PCT: 72 %
Platelets: 242 10*3/uL (ref 150–400)
RBC: 3.01 MIL/uL — ABNORMAL LOW (ref 3.87–5.11)
RDW: 20.5 % — AB (ref 11.5–15.5)
WBC: 6.1 10*3/uL (ref 4.0–10.5)

## 2017-01-24 MED ORDER — SODIUM CHLORIDE 0.9% FLUSH
10.0000 mL | INTRAVENOUS | Status: DC | PRN
  Administered 2017-01-24: 10 mL
  Filled 2017-01-24: qty 10

## 2017-01-24 MED ORDER — SODIUM CHLORIDE 0.9 % IV SOLN
Freq: Once | INTRAVENOUS | Status: AC
  Administered 2017-01-24: 11:00:00 via INTRAVENOUS

## 2017-01-24 MED ORDER — SODIUM CHLORIDE 0.9 % IV SOLN
1000.0000 mg/m2 | Freq: Once | INTRAVENOUS | Status: AC
  Administered 2017-01-24: 1520 mg via INTRAVENOUS
  Filled 2017-01-24: qty 26.3

## 2017-01-24 MED ORDER — PROCHLORPERAZINE MALEATE 10 MG PO TABS
10.0000 mg | ORAL_TABLET | Freq: Once | ORAL | Status: AC
  Administered 2017-01-24: 10 mg via ORAL

## 2017-01-24 MED ORDER — HEPARIN SOD (PORK) LOCK FLUSH 100 UNIT/ML IV SOLN
500.0000 [IU] | Freq: Once | INTRAVENOUS | Status: AC | PRN
  Administered 2017-01-24: 500 [IU]
  Filled 2017-01-24: qty 5

## 2017-01-24 MED ORDER — PROCHLORPERAZINE MALEATE 10 MG PO TABS
ORAL_TABLET | ORAL | Status: AC
  Filled 2017-01-24: qty 1

## 2017-01-24 NOTE — Progress Notes (Signed)
To treatment area for labs and chemo.  Treatment held last week due to platelets.  No bleeding in urine or stool or unusual bruises per patient.  Good appetite and no changes with neuropathy.  Family at side.   Patient tolerated chemotherapy with no complaints voiced.  Port site clean and dry with no bruising or swelling noted at site.  Band aid applied. VSS with discharge and left ambulatory with family.

## 2017-01-24 NOTE — Patient Instructions (Signed)
[facility] Discharge Instructions for Patients Receiving Chemotherapy  Today you received the following chemotherapy agents gemzar today.    If you develop nausea and vomiting that is not controlled by your nausea medication, call the clinic.   BELOW ARE SYMPTOMS THAT SHOULD BE REPORTED IMMEDIATELY:  *FEVER GREATER THAN 100.5 F  *CHILLS WITH OR WITHOUT FEVER  NAUSEA AND VOMITING THAT IS NOT CONTROLLED WITH YOUR NAUSEA MEDICATION  *UNUSUAL SHORTNESS OF BREATH  *UNUSUAL BRUISING OR BLEEDING  TENDERNESS IN MOUTH AND THROAT WITH OR WITHOUT PRESENCE OF ULCERS  *URINARY PROBLEMS  *BOWEL PROBLEMS  UNUSUAL RASH Items with * indicate a potential emergency and should be followed up as soon as possible.  Feel free to call the clinic should you have any questions or concerns. The clinic phone number is [phone].  Please show the [place] at check-in to the Emergency Department and triage nurse.

## 2017-01-31 ENCOUNTER — Ambulatory Visit (HOSPITAL_COMMUNITY): Payer: Medicare Other

## 2017-02-01 ENCOUNTER — Ambulatory Visit (HOSPITAL_COMMUNITY)
Admission: RE | Admit: 2017-02-01 | Discharge: 2017-02-01 | Disposition: A | Discharge status: Home / Self Care | Payer: Medicare Other | Source: Ambulatory Visit | Attending: Adult Health | Admitting: Adult Health

## 2017-02-01 ENCOUNTER — Other Ambulatory Visit (HOSPITAL_COMMUNITY): Payer: Self-pay | Admitting: Adult Health

## 2017-02-01 DIAGNOSIS — J90 Pleural effusion, not elsewhere classified: Secondary | ICD-10-CM | POA: Diagnosis not present

## 2017-02-01 DIAGNOSIS — C259 Malignant neoplasm of pancreas, unspecified: Secondary | ICD-10-CM

## 2017-02-01 DIAGNOSIS — I2510 Atherosclerotic heart disease of native coronary artery without angina pectoris: Secondary | ICD-10-CM | POA: Diagnosis not present

## 2017-02-01 DIAGNOSIS — R188 Other ascites: Secondary | ICD-10-CM | POA: Insufficient documentation

## 2017-02-01 DIAGNOSIS — I714 Abdominal aortic aneurysm, without rupture: Secondary | ICD-10-CM | POA: Diagnosis not present

## 2017-02-01 DIAGNOSIS — C7951 Secondary malignant neoplasm of bone: Secondary | ICD-10-CM | POA: Diagnosis not present

## 2017-02-01 DIAGNOSIS — C7800 Secondary malignant neoplasm of unspecified lung: Secondary | ICD-10-CM | POA: Insufficient documentation

## 2017-02-01 DIAGNOSIS — J439 Emphysema, unspecified: Secondary | ICD-10-CM | POA: Diagnosis not present

## 2017-02-01 DIAGNOSIS — I700 Atherosclerosis of aorta: Secondary | ICD-10-CM | POA: Diagnosis not present

## 2017-02-08 DIAGNOSIS — Z5112 Encounter for antineoplastic immunotherapy: Secondary | ICD-10-CM | POA: Diagnosis not present

## 2017-02-08 DIAGNOSIS — C253 Malignant neoplasm of pancreatic duct: Secondary | ICD-10-CM | POA: Diagnosis not present

## 2017-02-08 DIAGNOSIS — C3490 Malignant neoplasm of unspecified part of unspecified bronchus or lung: Secondary | ICD-10-CM | POA: Diagnosis not present

## 2017-02-08 DIAGNOSIS — D492 Neoplasm of unspecified behavior of bone, soft tissue, and skin: Secondary | ICD-10-CM | POA: Diagnosis not present

## 2017-02-08 DIAGNOSIS — N183 Chronic kidney disease, stage 3 (moderate): Secondary | ICD-10-CM | POA: Diagnosis not present

## 2017-02-08 DIAGNOSIS — D631 Anemia in chronic kidney disease: Secondary | ICD-10-CM | POA: Diagnosis not present

## 2017-02-24 NOTE — Progress Notes (Addendum)
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    Sites of Visceral and Bony Metastatic Disease: anterior left fifth rib   Location(s) of Symptomatic Metastases:  anterior left fifth rib                                                                                                     Past/Anticipated chemotherapy by medical oncology, if any:   11/13/2008  09/12/2009 Chemotherapy     Adjuvant Gemcitabine X 6 months    03/15/2012 - 06/13/2012 Chemotherapy    5-FU based chemotherapy   02/13/2013 - 05/20/2013 Chemotherapy     Gemzar Abraxane for metastatic disease (given to cover both primaries at [facility]   07/30/2014 - 09/10/2014 Chemotherapy     Gemzar abraxane stopped due to poor tolerance     10/19/2015 - 03/01/2016 Chemotherapy    The patient had palonosetron (ALOXI) injection 0.25 mg, 0.25 mg, Intravenous, Once, 10 of 12 cycles  leucovorin 652 mg in dextrose 5 % 250 mL infusion, 400 mg/m2 = 652 mg, Intravenous, Once, 10 of 12 cycles  fluorouracil (ADRUCIL) 2,950 mg in sodium chloride 0.9 % 91 mL chemo infusion, 1,800 mg/m2 = 2,950 mg (100 % of original dose 1,800 mg/m2), Intravenous, 1 Day/Dose, 10 of 12 cycles Dose modification: 1,920 mg/m2 (original dose 1,800 mg/m2, Cycle 1, Reason: Provider Judgment), 1,800 mg/m2 (original dose 1,800 mg/m2, Cycle 1, Reason: Provider Judgment), 1,500 mg/m2 (original dose 1,800 mg/m2, Cycle 2, Reason: Dose not tolerated)  irinotecan LIPOSOME (ONIVYDE) 55.9 mg in sodium chloride 0.9 % 500 mL chemo infusion, 35 mg/m2 = 55.9 mg (100 % of original dose 35 mg/m2), Intravenous, Once, 10 of 12 cycles Dose modification: 35 mg/m2 (original dose 35 mg/m2, Cycle 1, Reason: Provider Judgment) for chemotherapy treatment.     10/19/2015 - 03/01/2016 Chemotherapy    The patient had palonosetron (ALOXI) injection 0.25 mg, 0.25 mg,  Intravenous, Once, 10 of 12 cycles  leucovorin 652 mg in dextrose 5 % 250 mL infusion, 400 mg/m2 = 652 mg, Intravenous, Once, 10 of 12 cycles  fluorouracil (ADRUCIL) 2,950 mg in sodium chloride 0.9 % 91 mL chemo infusion, 1,800 mg/m2 = 2,950 mg (100 % of original dose 1,800 mg/m2), Intravenous, 1 Day/Dose, 10 of 12 cycles Dose modification: 1,920 mg/m2 (original dose 1,800 mg/m2, Cycle 1, Reason: Provider Judgment), 1,800 mg/m2 (original dose 1,800 mg/m2, Cycle 1, Reason: Provider Judgment), 1,500 mg/m2 (original dose 1,800 mg/m2, Cycle 2, Reason: Dose not tolerated)  irinotecan LIPOSOME (ONIVYDE) 55.9 mg in sodium chloride 0.9 % 500 mL chemo infusion, 35 mg/m2 = 55.9 mg (100 % of original dose 35 mg/m2), Intravenous, Once, 10 of 12 cycles Dose modification: 35 mg/m2 (original dose 35 mg/m2, Cycle 1, Reason: Provider Judgment) for chemotherapy treatment.       Pain on a scale of 0-10 is: No,Reports left fifth rib pain comes and goes, hurts mostly at night. Reports low back pain is ongoing still radiates to left hip when she rises from a sitting position.  Taking Aleve bid.   IMPRESSION: 02-01-17  CT chest wo contrast 1. Pulmonary metastases appear mildly increased. 2. Expansile anterior left fifth rib metastasis appears mildly increased. 3. 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 No noncontrast CT evidence of progressive metastatic disease in the abdomen or pelvis . 4. Evidence of fluid third-spacing, including new trace dependent bilateral pleural effusions, increased small pericardial effusion/thickening and new small volume ascites. 5. Morphologic changes suggesting cirrhosis . 6. Chronic findings include: Infrarenal 4.0 cm abdominal aortic aneurysm, stable. Aortic Atherosclerosis (ICD10-I70.0) and Emphysema (ICD10-J43.9). Coronary atherosclerosis .    IMPRESSION:02-01-17 CT Abdomen pelvis wo  contrast  1. Pulmonary metastases appear mildly increased. 2. Expansile anterior left fifth rib metastasis appears mildly increased. 3. 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 No noncontrast CT evidence of progressive metastatic disease in the abdomen or pelvis . 4. Evidence of fluid third-spacing, including new trace dependent bilateral pleural effusions, increased small pericardial effusion/thickening and new small volume ascites. 5. Morphologic changes suggesting cirrhosis . 6. Chronic findings include: Infrarenal 4.0 cm abdominal aortic aneurysm, stable. Aortic Atherosclerosis (ICD10-I70.0) and Emphysema (ICD10-J43.9). Coronary atherosclerosis .  Pain on a scale of 0-10 is: No,Reports left fifth rib pain comes and goes, hurts mostly at night. Reports low back pain is ongoing still radiates to left hip when she rises from a sitting position. Taking Aleve bid.  If Spine Met(s), symptoms, if any, include:Yes,back left side  Bowel/Bladder retention or incontinence (please describe):  Chronic diarrhea from pancreatic insufficiency (managing with Imodium AD, and Creon)  Numbness or weakness in extremities (please describe): Weakness in leg legs with swelling down her legs to the ankles with pitting edema, tries to keep her legs elevated while sitting and wearing bilateral compression stockings, taking Lasix 20 mg 2 tablets bid total of 80 mg daily, cramps in finger tips  Current Decadron regimen, if applicable: No  Ambulatory status? Walker? Wheelchair?: Ambulatory  SAFETY ISSUES:  Prior radiation? : SBRT LUL 18 Gy by Dr. Tammi Klippel  Pacemaker/ICD? : No  Pacemaker/ICD? No  Possible current pregnancy? : No  Is the patient on methotrexate? :No Weight: Taking Lasix 80 mg daily weight down by 6 pounds since last visit. Wt Readings from Last 3 Encounters:  02/28/17 125 lb (56.7 kg)  01/24/17 119 lb 12.8 oz  (54.3 kg)  01/17/17 118 lb 12.8 oz (53.9 kg)    Current Complaints / other details:  77 year old female. Mild weight loss. Former smoker quit in 1998. BP (!) 157/70 (BP Location: Right Arm, Patient Position: Sitting, Cuff Size: Normal)   Pulse (!) 59   Temp 98 F (36.7 C) (Oral)   Resp 18   Ht (!) 5" (0.127 m)   Wt 125 lb (56.7 kg)   SpO2 100%   BMI 3515.38 kg/m

## 2017-02-27 ENCOUNTER — Other Ambulatory Visit (HOSPITAL_COMMUNITY): Payer: Self-pay | Admitting: Oncology

## 2017-02-27 DIAGNOSIS — C7800 Secondary malignant neoplasm of unspecified lung: Principal | ICD-10-CM

## 2017-02-27 DIAGNOSIS — C259 Malignant neoplasm of pancreas, unspecified: Secondary | ICD-10-CM | POA: Diagnosis not present

## 2017-02-27 DIAGNOSIS — C3490 Malignant neoplasm of unspecified part of unspecified bronchus or lung: Secondary | ICD-10-CM | POA: Diagnosis not present

## 2017-02-27 DIAGNOSIS — Z299 Encounter for prophylactic measures, unspecified: Secondary | ICD-10-CM | POA: Diagnosis not present

## 2017-02-27 DIAGNOSIS — E7800 Pure hypercholesterolemia, unspecified: Secondary | ICD-10-CM | POA: Diagnosis not present

## 2017-02-27 DIAGNOSIS — I10 Essential (primary) hypertension: Secondary | ICD-10-CM | POA: Diagnosis not present

## 2017-02-27 DIAGNOSIS — Z6821 Body mass index (BMI) 21.0-21.9, adult: Secondary | ICD-10-CM | POA: Diagnosis not present

## 2017-02-27 DIAGNOSIS — Z789 Other specified health status: Secondary | ICD-10-CM | POA: Diagnosis not present

## 2017-02-28 ENCOUNTER — Encounter: Payer: Self-pay | Admitting: Urology

## 2017-02-28 ENCOUNTER — Ambulatory Visit
Admission: RE | Admit: 2017-02-28 | Discharge: 2017-02-28 | Disposition: A | Discharge status: Home / Self Care | Payer: Medicare Other | Source: Ambulatory Visit | Attending: Urology | Admitting: Urology

## 2017-02-28 ENCOUNTER — Other Ambulatory Visit: Payer: Self-pay

## 2017-02-28 ENCOUNTER — Ambulatory Visit
Admission: RE | Admit: 2017-02-28 | Discharge: 2017-02-28 | Disposition: A | Discharge status: Home / Self Care | Payer: Medicare Other | Source: Ambulatory Visit | Attending: Radiation Oncology | Admitting: Radiation Oncology

## 2017-02-28 VITALS — BP 157/70 | HR 59 | Temp 98.0°F | Resp 18 | Ht <= 58 in | Wt 125.0 lb

## 2017-02-28 DIAGNOSIS — C7800 Secondary malignant neoplasm of unspecified lung: Principal | ICD-10-CM

## 2017-02-28 DIAGNOSIS — C259 Malignant neoplasm of pancreas, unspecified: Secondary | ICD-10-CM

## 2017-02-28 DIAGNOSIS — C7802 Secondary malignant neoplasm of left lung: Secondary | ICD-10-CM | POA: Diagnosis not present

## 2017-02-28 DIAGNOSIS — Z87891 Personal history of nicotine dependence: Secondary | ICD-10-CM | POA: Diagnosis not present

## 2017-02-28 DIAGNOSIS — C7951 Secondary malignant neoplasm of bone: Secondary | ICD-10-CM

## 2017-02-28 DIAGNOSIS — Z8041 Family history of malignant neoplasm of ovary: Secondary | ICD-10-CM | POA: Diagnosis not present

## 2017-02-28 DIAGNOSIS — Z808 Family history of malignant neoplasm of other organs or systems: Secondary | ICD-10-CM | POA: Diagnosis not present

## 2017-02-28 DIAGNOSIS — Z9049 Acquired absence of other specified parts of digestive tract: Secondary | ICD-10-CM | POA: Diagnosis not present

## 2017-02-28 DIAGNOSIS — Z79899 Other long term (current) drug therapy: Secondary | ICD-10-CM | POA: Diagnosis not present

## 2017-02-28 DIAGNOSIS — Z9221 Personal history of antineoplastic chemotherapy: Secondary | ICD-10-CM | POA: Diagnosis not present

## 2017-02-28 DIAGNOSIS — Z9889 Other specified postprocedural states: Secondary | ICD-10-CM | POA: Diagnosis not present

## 2017-02-28 DIAGNOSIS — I10 Essential (primary) hypertension: Secondary | ICD-10-CM | POA: Diagnosis not present

## 2017-02-28 DIAGNOSIS — Z8541 Personal history of malignant neoplasm of cervix uteri: Secondary | ICD-10-CM | POA: Diagnosis not present

## 2017-02-28 DIAGNOSIS — J449 Chronic obstructive pulmonary disease, unspecified: Secondary | ICD-10-CM | POA: Diagnosis not present

## 2017-02-28 NOTE — Progress Notes (Signed)
Radiation Oncology         [phone] ________________________________  Outpatient Re-Consultation   Name: [name] [name] MRN: [id number]  Date of Service: 02/28/2017 DOB: [birth year]  DT:OIZT, Weldon Picking, MD  Darovsky, Marko Stai, MD   REFERRING PHYSICIAN: Dahlia Byes, MD  DIAGNOSIS: 77 yo woman with metastatic pancreatic cancer and expansile left 5th rib metastasis    ICD-10-CM   1. Pancreatic cancer metastasized to lung [facility]) C25.9 CT Biopsy   C78.00 CANCELED: CT Biopsy  2. Left 5th rib metastasis (HCC) C79.51     HISTORY OF PRESENT ILLNESS: [name] [name] is a 77 y.o. female seen at the request of Dr. Janae Sauce for metastatic pancreatic cancer to the lung and bones. The patient was originally diagnosed with her pancreatic cancer in 2010 and underwent Whipple procedure at [facility]. She was found to have progressive disease in 2012 and underwent LLL wedge lobectomy with Dr. Arlyce Dice confirming metastatic disease. She has been on systemic therapy since and also underwent SBRT with Dr. Tammi Klippel in 2013 to the left upper lobe. She had progression of another lesion in the left upper lobe and underwent repeat wedge resection in 2017 with Dr. Prescott Gum. She has continued on systemic therapy and had a CT of the chest, abdomen and pelvis that revealed mild progression of pulmonary metastasis and slight enlargement of a left fifth rib metastasis seen on 11/10/2016 CT. She has been treated with Tarceva and Gemzar and has continued this chemotherapy under the care of Dr. Talbert Cage. She was seen initially in 11/2016 at the request of Dr. Prescott Gum regarding pain she had been having in the left rib area to discuss the potential for radiotherapy to this site.  However, at the time of that visit, her rib pain had spontaneously resolved so the decision was to postpone radiotherapy unless the lesion progressed or became painful and instead, continue with systemic therapy.   Interval History:  Since we saw her in  11/2016, the rib lesion has progressively enlarged and is now close to the skin surface and painful/irritating.  The pain radiates into the anterior left chest intermittently.  She denies numbness or tingling and has not noted any skin breakthrough or bleeding from the lesion at this point.  It is very uncomfortable to wear her bra due to irritation of the lesion at her bra line. She has continued Tarceva and Gemzar under the care of Dr. Talbert Cage until recently when she transferred her care to Dr. Janae Sauce in [place], [place].  Recent CT imaging from 02/01/17 shows continued progression of her disease despite systemic therapy so the Tarceva and Gemzar has been stopped and she is currently being considered for targeted therapy or immunotherapy.   She presents today, accompanied by her sister, to again discuss the role of palliative radiotherapy to the enlarging bony metastatic mass at the left 5th rib.   PREVIOUS RADIATION THERAPY: Yes:  2013:  54 Gy SBRT in 3 fractions to the left upper lobe lung with Dr. Tammi Klippel  PAST MEDICAL HISTORY:  Past Medical History:  Diagnosis Date  . Abdominal wall hernia   . Anemia   . Anemia due to antineoplastic chemotherapy 07/05/2016  . Aortic aneurysm (HCC)    small aortic aneurysm  . Arthritis    "little in my right hand" (03/10/2016)  . Cervical cancer ([place]) 1963  . COPD (chronic obstructive pulmonary disease) ([place])   . Emphysema   . GERD (gastroesophageal reflux disease)   . Goals of care, counseling/discussion  02/02/2016  . Hypercholesterolemia   . Hypertension   . Low serum vitamin B12 01/20/2016  . Pancreatic adenocarcinoma [facility]) July 2010  . Pancreatic cancer metastasized to lung [facility]) 09/04/2015      PAST SURGICAL HISTORY: Past Surgical History:  Procedure Laterality Date  . BREAST CYST EXCISION Right 1988  . BUNIONECTOMY Bilateral 2007  . CARDIAC CATHETERIZATION    . CATARACT EXTRACTION W/ INTRAOCULAR LENS  IMPLANT, BILATERAL Bilateral 2009  .  CHOLECYSTECTOMY  July 2010   done w/ Whipple procedure  . IR GENERIC HISTORICAL  03/15/2016   IR GUIDED DRAIN W CATHETER PLACEMENT 03/15/2016 Greggory Keen, MD [facility]-INTERV RAD  . SKIN CANCER EXCISION Left    nasal fold  . STAPLING OF BLEBS Left 04/20/2015   Procedure: STAPLING OF BLEBS;  Surgeon: Ivin Poot, MD;  Location: [place];  Service: Thoracic;  Laterality: Left;  . THYROID SURGERY     "had a growth taken off"  . TONSILLECTOMY  1943  . VAGINAL HYSTERECTOMY  1963   Cervical Cancer  . VIDEO ASSISTED THORACOSCOPY Left 04/20/2015   Procedure: VIDEO ASSISTED THORACOSCOPY;  Surgeon: Ivin Poot, MD;  Location: [place];  Service: Thoracic;  Laterality: Left;  . WHIPPLE PROCEDURE  July 2010   pancreatic adenocarcinoma    FAMILY HISTORY:  Family History  Problem Relation Age of Onset  . Pancreatic cancer Brother   . Colon cancer Sister 87       12/2006 dx surgery and chemotherapy  . Ovarian cancer Sister 74       Ovarian ca,  surgery and chemotherapy  . Ovarian cancer Other   . Cancer Other        lung to brain    SOCIAL HISTORY:  Social History   Socioeconomic History  . Marital status: Widowed    Spouse name: Not on file  . Number of children: 0  . Years of education: Not on file  . Highest education level: Not on file  Social Needs  . Financial resource strain: Not on file  . Food insecurity - worry: Not on file  . Food insecurity - inability: Not on file  . Transportation needs - medical: Not on file  . Transportation needs - non-medical: Not on file  Occupational History    Employer: RETIRED  Tobacco Use  . Smoking status: Former Smoker    Packs/day: 1.00    Years: 30.00    Pack years: 30.00    Types: Cigarettes    Last attempt to quit: 02/29/1996    Years since quitting: 21.0  . Smokeless tobacco: Never Used  Substance and Sexual Activity  . Alcohol use: No  . Drug use: No  . Sexual activity: No  Other Topics Concern  . Not on file  Social History  Narrative  . Not on file  The patient lives in [place], she is accompanied by her sister.  ALLERGIES: Oxaliplatin; Pantoprazole; Adhesive [tape]; Aspirin; Neulasta [pegfilgrastim]; Oxycodone; and Tramadol  MEDICATIONS:  Current Outpatient Medications  Medication Sig Dispense Refill  . acetaminophen (TYLENOL) 325 MG tablet Take 2 tablets (650 mg total) by mouth every 6 (six) hours as needed for mild pain (or Fever >/= 101).    . cholecalciferol (VITAMIN D) 1000 units tablet Take 2,000 Units by mouth daily.    Marland Kitchen CREON 36000 units CPEP capsule TAKE 1 CAPSULE THREE TIMES A DAY WITH MEALS 270 capsule 1  . furosemide (LASIX) 20 MG tablet Take 40 mg by mouth 2 (two)  times daily. Taking two 20 mg tablets in the morning and two  20 mg tablets at lunch     . hydrochlorothiazide (HYDRODIURIL) 25 MG tablet Take 12.5 mg by mouth every morning.     Marland Kitchen losartan (COZAAR) 100 MG tablet Take 100 mg by mouth every morning.     . metoprolol succinate (TOPROL-XL) 50 MG 24 hr tablet Take 25 mg by mouth every morning. Take with or immediately following a meal.     . naproxen sodium (ALEVE) 220 MG tablet Take 220 mg by mouth 2 (two) times daily.    Marland Kitchen omeprazole (PRILOSEC) 20 MG capsule Take 20 mg by mouth daily before breakfast.     . potassium chloride SA (K-DUR,KLOR-CON) 20 MEQ tablet Take 20 mEq by mouth 2 (two) times daily.    Alveda Reasons 20 MG TABS tablet Take 1 tablet (20 mg total) by mouth daily with supper. 90 tablet 1  . amoxicillin (AMOXIL) 500 MG capsule Take 2,000 mg by mouth See admin instructions. For Dental Procedures    . Gemcitabine HCl (GEMZAR IV) Inject into the vein.     No current facility-administered medications for this encounter.     REVIEW OF SYSTEMS:  On review of systems, the patient reports that she is doing well overall. She denies any chest pain, shortness of breath, cough, fevers, chills, or night sweats. She has had continued mild unintended weight loss. She denies any bladder  disturbances, and denies abdominal pain, nausea or vomiting. She reports left rib pain and chronic low back pain that radiates to the left hip. She reports that the left fifth rib pain has progressively worsened and now radiates into the anterior left chest intermittently.  It is also increasingly uncomfortable to wear a bra due to the lesion protruding to the skin surface. She reports that the low back pain radiating to her left hip when she rises from a sitting position remains unchanged. She has also had chronic diarrhea from pancreatic insufficiency, managed with imodium AD, Lomotil, and Creon. She also reports numbness in her finger tips, unchanged recently. A complete review of systems is obtained and is otherwise negative.    PHYSICAL EXAM:  Wt Readings from Last 3 Encounters:  02/28/17 125 lb (56.7 kg)  01/24/17 119 lb 12.8 oz (54.3 kg)  01/17/17 118 lb 12.8 oz (53.9 kg)   Temp Readings from Last 3 Encounters:  02/28/17 98 F (36.7 C) (Oral)  01/24/17 97.7 F (36.5 C) (Oral)  01/17/17 (!) 97.5 F (36.4 C) (Oral)   BP Readings from Last 3 Encounters:  02/28/17 (!) 157/70  01/24/17 (!) 115/53  01/17/17 (!) 152/57   Pulse Readings from Last 3 Encounters:  02/28/17 (!) 59  01/24/17 (!) 57  01/17/17 70   Pain Assessment Pain Score: 0-No pain/10  In general this is a well appearing Caucasian woman in no acute distress. She is alert and oriented x4 and appropriate throughout the examination. HEENT reveals that the patient is normocephalic, atraumatic. Cardiovascular exam reveals a regular rate and rhythm, no clicks rubs or murmurs are auscultated. Chest is clear to auscultation bilaterally. She has a large reducible abdominal wall hernia at the site of her previous Whipple incision. Visible incision along the left chest wall laterally about level V/IV rib with palpable anomaly. The lesion is erythematous and appears to be tethered to the skin's surface with mild blanching but no skin  breakthrough at this point.  It is mildly painful with palpation.  No palpable  mass is noted otherwise in the cervical, supraclavicular, or axillary nodes on the left.  KPS = 90  100 - Normal; no complaints; no evidence of disease. 90   - Able to carry on normal activity; minor signs or symptoms of disease. 80   - Normal activity with effort; some signs or symptoms of disease. 60   - Cares for self; unable to carry on normal activity or to do active work. 60   - Requires occasional assistance, but is able to care for most of his personal needs. 50   - Requires considerable assistance and frequent medical care. 102   - Disabled; requires special care and assistance. 15   - Severely disabled; hospital admission is indicated although death not imminent. 32   - Very sick; hospital admission necessary; active supportive treatment necessary. 10   - Moribund; fatal processes progressing rapidly. 0     - Dead  Karnofsky DA, Abelmann [place], [place]ver LS and Burchenal [facility] [phone]) The use of the nitrogen mustards in the palliative treatment of carcinoma: with particular reference to bronchogenic carcinoma Cancer 1 634-56  LABORATORY DATA:  Lab Results  Component Value Date   WBC 6.1 01/24/2017   HGB 9.2 (L) 01/24/2017   HCT 30.4 (L) 01/24/2017   MCV 101.0 (H) 01/24/2017   PLT 242 01/24/2017   Lab Results  Component Value Date   NA 139 01/24/2017   K 3.9 01/24/2017   CL 113 (H) 01/24/2017   CO2 20 (L) 01/24/2017   Lab Results  Component Value Date   ALT 19 01/24/2017   AST 28 01/24/2017   ALKPHOS 111 01/24/2017   BILITOT 0.5 01/24/2017     RADIOGRAPHY: Ct Abdomen Pelvis Wo Contrast  Result Date: 02/02/2017 CLINICAL DATA:  Metastatic pancreatic cancer. Whipple July 2010. Ongoing chemotherapy. Restaging. EXAM: CT CHEST, ABDOMEN AND PELVIS WITHOUT CONTRAST TECHNIQUE: Multidetector CT imaging of the chest, abdomen and pelvis was performed following the standard protocol without IV contrast.  COMPARISON:  11/10/2016 CT chest, abdomen and pelvis. FINDINGS: CT CHEST FINDINGS Cardiovascular: Normal heart size. Small pericardial effusion/ thickening, slightly increased. Three-vessel coronary atherosclerosis. Right internal jugular MediPort terminates in the middle third of the superior vena cava. Atherosclerotic nonaneurysmal thoracic aorta. Stable dilated main pulmonary artery (3.5 cm diameter). Mediastinum/Nodes: Stable dominant hypodense 1.5 cm posterior right thyroid lobe nodule. Unremarkable esophagus. No pathologically enlarged axillary, mediastinal or gross hilar lymph nodes, noting limited sensitivity for the detection of hilar adenopathy on this noncontrast study. Lungs/Pleura: No pneumothorax. Trace dependent bilateral pleural effusions are new bilaterally. Moderate to severe centrilobular and paraseptal emphysema with mild diffuse bronchial wall thickening. Surgical sutures are noted in the posterior right upper lobe, lingula and bilateral lower lobes. There are several irregularly-shaped partially cavitary sub solid lung masses and pulmonary nodules scattered throughout both lungs, most of which appear mildly increased in size in the interval. For example, a dominant 7.8 x 3.9 cm posterior right lower lobe irregular pressure cavitary lung mass (series 6/ image 103), previously 6.1 x 3.7 cm, increased. Medial basilar right lower lobe 2.6 x 1.6 cm irregular nodule (series 6/image 120), previously 2.0 x 1.5 cm, mildly increased. Posterior left lower lobe 1.4 cm nodule (series 6/image 107), previously 1.1 cm, mildly increased. Stable bandlike foci of scarring in the lingula and posterior right upper lobe. Musculoskeletal: Large expansile mixed lytic and sclerotic left anterior fifth rib osseous lesion measures up to 2.7 cm diameter (series 6/image 80), previously 2.3 cm, increased. Stable minimally expansile sclerotic  lesion in the left anterior sixth rib. No new thoracic osseous lesions. Moderate  thoracic spondylosis. CT ABDOMEN PELVIS FINDINGS Hepatobiliary: Diffusely lobulated liver contour suggesting cirrhosis. No liver masses. Stable expected pneumobilia throughout the mildly dilated intrahepatic bile ducts status post choledochojejunostomy. Cholecystectomy. Pancreas: Stable postsurgical changes from resection of the pancreatic head and pancreaticojejunostomy. Stable scattered gas in the dilated main pancreatic duct up to 6 mm diameter. No discrete mass in the remnant pancreatic body and tail. Spleen: Normal size. No mass. Adrenals/Urinary Tract: Stable 1.6 cm right adrenal nodule with density 3 HU suggesting a benign adenoma. Stable thickening of the left adrenal gland without discrete left adrenal nodule. Simple 1.9 cm anterior lower left renal cyst. No additional contour deforming renal lesions. No hydronephrosis. No renal stones. Normal bladder. Stomach/Bowel: Stable large supraumbilical midline ventral abdominal hernia containing a portion of the distal stomach and several small bowel loops. Stable postsurgical changes from gastrojejunostomy. No acute gastric abnormality. Normal caliber small bowel loops with no definite small bowel wall thickening. Oral contrast reaches the rectum. Normal appendix. Normal large bowel with no diverticulosis, large bowel wall thickening or pericolonic fat stranding. Vascular/Lymphatic: Atherosclerotic abdominal aorta with stable 4.0 cm infrarenal abdominal aortic aneurysm. Previously described ill-defined soft tissue density surrounding the proximal superior mesenteric artery (series 2/ image 63) is poorly delineated on this noncontrast scan and appears grossly stable. No pathologically enlarged lymph nodes in the abdomen or pelvis. Reproductive: Status post hysterectomy, with no abnormal findings at the vaginal cuff. No adnexal mass. Other: No pneumoperitoneum. New small volume ascites. No focal fluid collection. Musculoskeletal: No aggressive appearing focal  osseous lesions. Moderate lumbar spondylosis. IMPRESSION: 1. Pulmonary metastases appear mildly increased. 2. Expansile anterior left fifth rib metastasis appears mildly increased. 3. 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 No noncontrast CT evidence of progressive metastatic disease in the abdomen or pelvis . 4. Evidence of fluid third-spacing, including new trace dependent bilateral pleural effusions, increased small pericardial effusion/thickening and new small volume ascites. 5. Morphologic changes suggesting cirrhosis . 6. Chronic findings include: Infrarenal 4.0 cm abdominal aortic aneurysm, stable. Aortic Atherosclerosis (ICD10-I70.0) and Emphysema (ICD10-J43.9). Coronary atherosclerosis . Electronically Signed   By: Ilona Sorrel M.D.   On: 02/02/2017 07:34   Ct Chest Wo Contrast  Result Date: 02/02/2017 CLINICAL DATA:  Metastatic pancreatic cancer. Whipple July 2010. Ongoing chemotherapy. Restaging. EXAM: CT CHEST, ABDOMEN AND PELVIS WITHOUT CONTRAST TECHNIQUE: Multidetector CT imaging of the chest, abdomen and pelvis was performed following the standard protocol without IV contrast. COMPARISON:  11/10/2016 CT chest, abdomen and pelvis. FINDINGS: CT CHEST FINDINGS Cardiovascular: Normal heart size. Small pericardial effusion/ thickening, slightly increased. Three-vessel coronary atherosclerosis. Right internal jugular MediPort terminates in the middle third of the superior vena cava. Atherosclerotic nonaneurysmal thoracic aorta. Stable dilated main pulmonary artery (3.5 cm diameter). Mediastinum/Nodes: Stable dominant hypodense 1.5 cm posterior right thyroid lobe nodule. Unremarkable esophagus. No pathologically enlarged axillary, mediastinal or gross hilar lymph nodes, noting limited sensitivity for the detection of hilar adenopathy on this noncontrast study. Lungs/Pleura: No pneumothorax. Trace dependent bilateral pleural  effusions are new bilaterally. Moderate to severe centrilobular and paraseptal emphysema with mild diffuse bronchial wall thickening. Surgical sutures are noted in the posterior right upper lobe, lingula and bilateral lower lobes. There are several irregularly-shaped partially cavitary sub solid lung masses and pulmonary nodules scattered throughout both lungs, most of which appear mildly increased in size in the interval. For example, a dominant 7.8  x 3.9 cm posterior right lower lobe irregular pressure cavitary lung mass (series 6/ image 103), previously 6.1 x 3.7 cm, increased. Medial basilar right lower lobe 2.6 x 1.6 cm irregular nodule (series 6/image 120), previously 2.0 x 1.5 cm, mildly increased. Posterior left lower lobe 1.4 cm nodule (series 6/image 107), previously 1.1 cm, mildly increased. Stable bandlike foci of scarring in the lingula and posterior right upper lobe. Musculoskeletal: Large expansile mixed lytic and sclerotic left anterior fifth rib osseous lesion measures up to 2.7 cm diameter (series 6/image 80), previously 2.3 cm, increased. Stable minimally expansile sclerotic lesion in the left anterior sixth rib. No new thoracic osseous lesions. Moderate thoracic spondylosis. CT ABDOMEN PELVIS FINDINGS Hepatobiliary: Diffusely lobulated liver contour suggesting cirrhosis. No liver masses. Stable expected pneumobilia throughout the mildly dilated intrahepatic bile ducts status post choledochojejunostomy. Cholecystectomy. Pancreas: Stable postsurgical changes from resection of the pancreatic head and pancreaticojejunostomy. Stable scattered gas in the dilated main pancreatic duct up to 6 mm diameter. No discrete mass in the remnant pancreatic body and tail. Spleen: Normal size. No mass. Adrenals/Urinary Tract: Stable 1.6 cm right adrenal nodule with density 3 HU suggesting a benign adenoma. Stable thickening of the left adrenal gland without discrete left adrenal nodule. Simple 1.9 cm anterior lower  left renal cyst. No additional contour deforming renal lesions. No hydronephrosis. No renal stones. Normal bladder. Stomach/Bowel: Stable large supraumbilical midline ventral abdominal hernia containing a portion of the distal stomach and several small bowel loops. Stable postsurgical changes from gastrojejunostomy. No acute gastric abnormality. Normal caliber small bowel loops with no definite small bowel wall thickening. Oral contrast reaches the rectum. Normal appendix. Normal large bowel with no diverticulosis, large bowel wall thickening or pericolonic fat stranding. Vascular/Lymphatic: Atherosclerotic abdominal aorta with stable 4.0 cm infrarenal abdominal aortic aneurysm. Previously described ill-defined soft tissue density surrounding the proximal superior mesenteric artery (series 2/ image 63) is poorly delineated on this noncontrast scan and appears grossly stable. No pathologically enlarged lymph nodes in the abdomen or pelvis. Reproductive: Status post hysterectomy, with no abnormal findings at the vaginal cuff. No adnexal mass. Other: No pneumoperitoneum. New small volume ascites. No focal fluid collection. Musculoskeletal: No aggressive appearing focal osseous lesions. Moderate lumbar spondylosis. IMPRESSION: 1. Pulmonary metastases appear mildly increased. 2. Expansile anterior left fifth rib metastasis appears mildly increased. 3. 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 No noncontrast CT evidence of progressive metastatic disease in the abdomen or pelvis . 4. Evidence of fluid third-spacing, including new trace dependent bilateral pleural effusions, increased small pericardial effusion/thickening and new small volume ascites. 5. Morphologic changes suggesting cirrhosis . 6. Chronic findings include: Infrarenal 4.0 cm abdominal aortic aneurysm, stable. Aortic Atherosclerosis (ICD10-I70.0) and Emphysema (ICD10-J43.9). Coronary  atherosclerosis . Electronically Signed   By: Ilona Sorrel M.D.   On: 02/02/2017 07:34      IMPRESSION/PLAN: 1. 77 y.o. woman with metastatic pancreatic cancer with progressive disease in the left 5th rib. Today, we reviewed the patient's CT images and her symptoms. She would likely benefit from palliative radiotherapy to the left rib to prevent further progression and possible skin breakdown. We discussed the risks, benefits, short, and long term effects of radiotherapy, and the patient would like to proceed with treatment.  Prior to starting treatment, the recommendation is for biopsy of the rib metastasis for tissue confirmation and also to update molecular information.  We will order CT guided biopsy of the left 5th rib metastasis  for MSI- HdMMR/PD-L1 panel and forward these results to Dr. Janae Sauce to assist with selecting the appropriate systemic treatment.  Once her biopsy has been completed, we will move forward with scheduling CT SIM for treatment planning.  The recommendation is for a 2 week course of daily treatment to the left rib with 30Gy in 39fs.  The patient is in agreement and has freely signed written consent today in the office and this document was placed in her chart.  She was provided with a copy of the consent document for her personal records as well.   In a visit lasting 45 minutes, greater than 50% of the time was spent face to face discussing options of radiotherapy, and coordinating the patient's care.    ANicholos Johns PA-C    MTyler Pita MD  [facility] Direct Dial: [phone] Fax: [phone]  Skype  LinkedIn

## 2017-03-01 ENCOUNTER — Other Ambulatory Visit: Payer: Self-pay | Admitting: Radiology

## 2017-03-02 ENCOUNTER — Telehealth: Payer: Self-pay | Admitting: *Deleted

## 2017-03-02 ENCOUNTER — Other Ambulatory Visit: Payer: Self-pay | Admitting: Student

## 2017-03-02 NOTE — Telephone Encounter (Signed)
PATIENT TO HAVE CT ON 03-03-17 @ 11 AM @ [place]

## 2017-03-03 ENCOUNTER — Encounter (HOSPITAL_COMMUNITY): Payer: Self-pay

## 2017-03-03 ENCOUNTER — Ambulatory Visit (HOSPITAL_COMMUNITY)
Admission: RE | Admit: 2017-03-03 | Discharge: 2017-03-03 | Disposition: A | Discharge status: Home / Self Care | Payer: Medicare Other | Source: Ambulatory Visit | Attending: Urology | Admitting: Urology

## 2017-03-03 DIAGNOSIS — Z9049 Acquired absence of other specified parts of digestive tract: Secondary | ICD-10-CM | POA: Insufficient documentation

## 2017-03-03 DIAGNOSIS — Z9842 Cataract extraction status, left eye: Secondary | ICD-10-CM | POA: Diagnosis not present

## 2017-03-03 DIAGNOSIS — C7800 Secondary malignant neoplasm of unspecified lung: Secondary | ICD-10-CM | POA: Insufficient documentation

## 2017-03-03 DIAGNOSIS — R222 Localized swelling, mass and lump, trunk: Secondary | ICD-10-CM | POA: Diagnosis not present

## 2017-03-03 DIAGNOSIS — C7989 Secondary malignant neoplasm of other specified sites: Secondary | ICD-10-CM | POA: Diagnosis not present

## 2017-03-03 DIAGNOSIS — Z8507 Personal history of malignant neoplasm of pancreas: Secondary | ICD-10-CM | POA: Diagnosis not present

## 2017-03-03 DIAGNOSIS — K219 Gastro-esophageal reflux disease without esophagitis: Secondary | ICD-10-CM | POA: Diagnosis not present

## 2017-03-03 DIAGNOSIS — I313 Pericardial effusion (noninflammatory): Secondary | ICD-10-CM | POA: Insufficient documentation

## 2017-03-03 DIAGNOSIS — I714 Abdominal aortic aneurysm, without rupture: Secondary | ICD-10-CM | POA: Diagnosis not present

## 2017-03-03 DIAGNOSIS — Z85828 Personal history of other malignant neoplasm of skin: Secondary | ICD-10-CM | POA: Diagnosis not present

## 2017-03-03 DIAGNOSIS — Z886 Allergy status to analgesic agent status: Secondary | ICD-10-CM | POA: Insufficient documentation

## 2017-03-03 DIAGNOSIS — Z800 Family history of malignant neoplasm of digestive organs: Secondary | ICD-10-CM | POA: Insufficient documentation

## 2017-03-03 DIAGNOSIS — Z808 Family history of malignant neoplasm of other organs or systems: Secondary | ICD-10-CM | POA: Insufficient documentation

## 2017-03-03 DIAGNOSIS — J449 Chronic obstructive pulmonary disease, unspecified: Secondary | ICD-10-CM | POA: Diagnosis not present

## 2017-03-03 DIAGNOSIS — Z9889 Other specified postprocedural states: Secondary | ICD-10-CM | POA: Insufficient documentation

## 2017-03-03 DIAGNOSIS — Z888 Allergy status to other drugs, medicaments and biological substances status: Secondary | ICD-10-CM | POA: Insufficient documentation

## 2017-03-03 DIAGNOSIS — I700 Atherosclerosis of aorta: Secondary | ICD-10-CM | POA: Insufficient documentation

## 2017-03-03 DIAGNOSIS — E7800 Pure hypercholesterolemia, unspecified: Secondary | ICD-10-CM | POA: Diagnosis not present

## 2017-03-03 DIAGNOSIS — Z87891 Personal history of nicotine dependence: Secondary | ICD-10-CM | POA: Insufficient documentation

## 2017-03-03 DIAGNOSIS — Z8541 Personal history of malignant neoplasm of cervix uteri: Secondary | ICD-10-CM | POA: Insufficient documentation

## 2017-03-03 DIAGNOSIS — C493 Malignant neoplasm of connective and soft tissue of thorax: Secondary | ICD-10-CM | POA: Diagnosis not present

## 2017-03-03 DIAGNOSIS — C259 Malignant neoplasm of pancreas, unspecified: Secondary | ICD-10-CM | POA: Diagnosis not present

## 2017-03-03 DIAGNOSIS — I10 Essential (primary) hypertension: Secondary | ICD-10-CM | POA: Diagnosis not present

## 2017-03-03 DIAGNOSIS — D492 Neoplasm of unspecified behavior of bone, soft tissue, and skin: Secondary | ICD-10-CM | POA: Diagnosis not present

## 2017-03-03 DIAGNOSIS — Z8041 Family history of malignant neoplasm of ovary: Secondary | ICD-10-CM | POA: Insufficient documentation

## 2017-03-03 DIAGNOSIS — Z9841 Cataract extraction status, right eye: Secondary | ICD-10-CM | POA: Insufficient documentation

## 2017-03-03 DIAGNOSIS — C7951 Secondary malignant neoplasm of bone: Secondary | ICD-10-CM | POA: Insufficient documentation

## 2017-03-03 DIAGNOSIS — Z79899 Other long term (current) drug therapy: Secondary | ICD-10-CM | POA: Insufficient documentation

## 2017-03-03 DIAGNOSIS — Z885 Allergy status to narcotic agent status: Secondary | ICD-10-CM | POA: Insufficient documentation

## 2017-03-03 DIAGNOSIS — J90 Pleural effusion, not elsewhere classified: Secondary | ICD-10-CM | POA: Insufficient documentation

## 2017-03-03 DIAGNOSIS — Z9221 Personal history of antineoplastic chemotherapy: Secondary | ICD-10-CM | POA: Insufficient documentation

## 2017-03-03 DIAGNOSIS — R188 Other ascites: Secondary | ICD-10-CM | POA: Diagnosis not present

## 2017-03-03 DIAGNOSIS — Z7901 Long term (current) use of anticoagulants: Secondary | ICD-10-CM | POA: Insufficient documentation

## 2017-03-03 DIAGNOSIS — Z8042 Family history of malignant neoplasm of prostate: Secondary | ICD-10-CM | POA: Insufficient documentation

## 2017-03-03 DIAGNOSIS — Z801 Family history of malignant neoplasm of trachea, bronchus and lung: Secondary | ICD-10-CM | POA: Insufficient documentation

## 2017-03-03 LAB — CBC
HCT: 32.7 % — ABNORMAL LOW (ref 36.0–46.0)
Hemoglobin: 10.4 g/dL — ABNORMAL LOW (ref 12.0–15.0)
MCH: 32.4 pg (ref 26.0–34.0)
MCHC: 31.8 g/dL (ref 30.0–36.0)
MCV: 101.9 fL — ABNORMAL HIGH (ref 78.0–100.0)
PLATELETS: 231 10*3/uL (ref 150–400)
RBC: 3.21 MIL/uL — AB (ref 3.87–5.11)
RDW: 21.3 % — AB (ref 11.5–15.5)
WBC: 9.9 10*3/uL (ref 4.0–10.5)

## 2017-03-03 LAB — PROTIME-INR
INR: 1.31
PROTHROMBIN TIME: 16.1 s — AB (ref 11.4–15.2)

## 2017-03-03 LAB — APTT: APTT: 36 s (ref 24–36)

## 2017-03-03 MED ORDER — FENTANYL CITRATE (PF) 100 MCG/2ML IJ SOLN
INTRAMUSCULAR | Status: AC
  Filled 2017-03-03: qty 4

## 2017-03-03 MED ORDER — LIDOCAINE HCL 1 % IJ SOLN
INTRAMUSCULAR | Status: AC
  Filled 2017-03-03: qty 20

## 2017-03-03 MED ORDER — FENTANYL CITRATE (PF) 100 MCG/2ML IJ SOLN
INTRAMUSCULAR | Status: AC | PRN
  Administered 2017-03-03 (×2): 25 ug via INTRAVENOUS
  Administered 2017-03-03: 50 ug via INTRAVENOUS

## 2017-03-03 MED ORDER — MIDAZOLAM HCL 2 MG/2ML IJ SOLN
INTRAMUSCULAR | Status: AC
  Filled 2017-03-03: qty 6

## 2017-03-03 MED ORDER — SODIUM CHLORIDE 0.9 % IV SOLN: INTRAVENOUS | Status: DC

## 2017-03-03 MED ORDER — MIDAZOLAM HCL 2 MG/2ML IJ SOLN
INTRAMUSCULAR | Status: AC | PRN
  Administered 2017-03-03 (×3): 1 mg via INTRAVENOUS

## 2017-03-03 NOTE — Procedures (Signed)
Interventional Radiology Procedure Note  Procedure: CT guided biopsy left chest wall mass  Complications: None  Estimated Blood Loss: None  Recommendations: - DC home after 1 hr  Signed,  Criselda Peaches, MD

## 2017-03-03 NOTE — Sedation Documentation (Signed)
Patient denies pain and is resting comfortably.  

## 2017-03-03 NOTE — Discharge Instructions (Signed)

## 2017-03-03 NOTE — H&P (Signed)
Chief Complaint: Patient was seen in consultation today for left chest wall mass biopsy at the request of Bruning,Ashlyn  Referring Physician(s): Bruning,Ashlyn Dr Tyler Pita  Supervising Physician: Jacqulynn Cadet  Patient Status: [facility] - Out-pt  History of Present Illness: [name] [name] is a 77 y.o. female   Hx pancreatic caner 2010 Whipple surgery then Metastasis to lung and bones LLL wedge surgery 2012 and again 2017 Noted enlargement in left rib lesion seen 11/2016 This lesion now protruding from skin; red and angry appearing CT 02/01/17: IMPRESSION: 1. Pulmonary metastases appear mildly increased. 2. Expansile anterior left fifth rib metastasis appears mildly increased. 3. 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 No noncontrast CT evidence of progressive metastatic disease in the abdomen or pelvis . 4. Evidence of fluid third-spacing, including new trace dependent bilateral pleural effusions, increased small pericardial effusion/thickening and new small volume ascites. 5. Morphologic changes suggesting cirrhosis . 6. Chronic findings include: Infrarenal 4.0 cm abdominal aortic aneurysm, stable.  Request for biopsy of this mass per Dr Tammi Klippel Last dose Xarelto 03/01/17   Past Medical History:  Diagnosis Date  . Abdominal wall hernia   . Anemia   . Anemia due to antineoplastic chemotherapy 07/05/2016  . Aortic aneurysm (HCC)    small aortic aneurysm  . Arthritis    "little in my right hand" (03/10/2016)  . Cervical cancer ([place]) 1963  . COPD (chronic obstructive pulmonary disease) ([place])   . Emphysema   . GERD (gastroesophageal reflux disease)   . Goals of care, counseling/discussion 02/02/2016  . Hypercholesterolemia   . Hypertension   . Low serum vitamin B12 01/20/2016  . Pancreatic adenocarcinoma [facility]) July 2010  . Pancreatic cancer metastasized to lung [facility]) 09/04/2015     Past Surgical History:  Procedure Laterality Date  . BREAST CYST EXCISION Right 1988  . BUNIONECTOMY Bilateral 2007  . CARDIAC CATHETERIZATION    . CATARACT EXTRACTION W/ INTRAOCULAR LENS  IMPLANT, BILATERAL Bilateral 2009  . CHOLECYSTECTOMY  July 2010   done w/ Whipple procedure  . IR GENERIC HISTORICAL  03/15/2016   IR GUIDED DRAIN W CATHETER PLACEMENT 03/15/2016 Greggory Keen, MD [facility]-INTERV RAD  . SKIN CANCER EXCISION Left    nasal fold  . STAPLING OF BLEBS Left 04/20/2015   Procedure: STAPLING OF BLEBS;  Surgeon: Ivin Poot, MD;  Location: [place];  Service: Thoracic;  Laterality: Left;  . THYROID SURGERY     "had a growth taken off"  . TONSILLECTOMY  1943  . VAGINAL HYSTERECTOMY  1963   Cervical Cancer  . VIDEO ASSISTED THORACOSCOPY Left 04/20/2015   Procedure: VIDEO ASSISTED THORACOSCOPY;  Surgeon: Ivin Poot, MD;  Location: [place];  Service: Thoracic;  Laterality: Left;  . WHIPPLE PROCEDURE  July 2010   pancreatic adenocarcinoma    Allergies: Oxaliplatin; Pantoprazole; Adhesive [tape]; Aspirin; Neulasta [pegfilgrastim]; Oxycodone; and Tramadol  Medications: Prior to Admission medications   Medication Sig Start Date End Date Taking? Authorizing Provider  acetaminophen (TYLENOL) 325 MG tablet Take 2 tablets (650 mg total) by mouth every 6 (six) hours as needed for mild pain (or Fever >/= 101). 03/18/16  Yes Lars Pinks M, PA-C  cholecalciferol (VITAMIN D) 1000 units tablet Take 2,000 Units by mouth daily.   Yes [provider]  CREON 36000 units CPEP capsule TAKE 1 CAPSULE THREE TIMES A DAY WITH MEALS 08/30/16  Yes Kefalas, Manon Hilding, PA-C  furosemide (LASIX) 20 MG tablet Take  40 mg by mouth 2 (two) times daily. Taking two 20 mg tablets in the morning and two  20 mg tablets at lunch    Yes [provider]  hydrochlorothiazide (HYDRODIURIL) 25 MG tablet Take 12.5 mg by mouth every morning.    Yes [provider]  losartan (COZAAR) 100 MG  tablet Take 100 mg by mouth every morning.  08/31/15  Yes [provider]  metoprolol succinate (TOPROL-XL) 50 MG 24 hr tablet Take 25 mg by mouth every morning. Take with or immediately following a meal.    Yes [provider]  naproxen sodium (ALEVE) 220 MG tablet Take 220 mg by mouth 2 (two) times daily.   Yes [provider]  omeprazole (PRILOSEC) 20 MG capsule Take 20 mg by mouth daily before breakfast.    Yes [provider]  potassium chloride SA (K-DUR,KLOR-CON) 20 MEQ tablet Take 20 mEq by mouth 2 (two) times daily.   Yes [provider]  amoxicillin (AMOXIL) 500 MG capsule Take 2,000 mg by mouth See admin instructions. For Dental Procedures    [provider]  Gemcitabine HCl (GEMZAR IV) Inject into the vein.    [provider]  XARELTO 20 MG TABS tablet Take 1 tablet (20 mg total) by mouth daily with supper. 11/03/16   Holley Bouche, NP     Family History  Problem Relation Age of Onset  . Pancreatic cancer Brother   . Colon cancer Sister 28       12/2006 dx surgery and chemotherapy  . Ovarian cancer Sister 46       Ovarian ca,  surgery and chemotherapy  . Ovarian cancer Other   . Cancer Other        lung to brain    Social History   Socioeconomic History  . Marital status: Widowed    Spouse name: None  . Number of children: 0  . Years of education: None  . Highest education level: None  Social Needs  . Financial resource strain: None  . Food insecurity - worry: None  . Food insecurity - inability: None  . Transportation needs - medical: None  . Transportation needs - non-medical: None  Occupational History    Employer: RETIRED  Tobacco Use  . Smoking status: Former Smoker    Packs/day: 1.00    Years: 30.00    Pack years: 30.00    Types: Cigarettes    Last attempt to quit: 02/29/1996    Years since quitting: 21.0  . Smokeless tobacco: Never Used  Substance and Sexual Activity  . Alcohol use: No    . Drug use: No  . Sexual activity: No  Other Topics Concern  . None  Social History Narrative  . None     Review of Systems: A 12 point ROS discussed and pertinent positives are indicated in the HPI above.  All other systems are negative.  Review of Systems  Constitutional: Positive for unexpected weight change. Negative for activity change, fatigue and fever.  Respiratory: Negative for shortness of breath.   Cardiovascular:       Left chest wall pain  Musculoskeletal: Positive for back pain. Negative for gait problem.  Neurological: Negative for weakness.  Psychiatric/Behavioral: Negative for behavioral problems and confusion.    Vital Signs: BP (!) 195/68   Pulse (!) 53   Temp 97.6 F (36.4 C) (Oral)   Ht 5\' 4"  (1.626 m)   Wt 125 lb (56.7 kg)   SpO2 97%  BMI 21.46 kg/m   Physical Exam  Constitutional: She is oriented to person, place, and time.  Frail Losing weight  Cardiovascular: Normal rate and regular rhythm.  Pulmonary/Chest: Effort normal and breath sounds normal.  Abdominal: Soft. Bowel sounds are normal.  Musculoskeletal: Normal range of motion. She exhibits edema.  Bil Leg edema  Neurological: She is alert and oriented to person, place, and time.  Skin: Skin is warm and dry.  Reddened skin at left 5th rib Mass is extending from skin 2cm x 6 cm in length Looks tender and swollen  Psychiatric: She has a normal mood and affect. Her behavior is normal. Judgment and thought content normal.  Nursing note and vitals reviewed.   Imaging: Ct Abdomen Pelvis Wo Contrast  Result Date: 02/02/2017 CLINICAL DATA:  Metastatic pancreatic cancer. Whipple July 2010. Ongoing chemotherapy. Restaging. EXAM: CT CHEST, ABDOMEN AND PELVIS WITHOUT CONTRAST TECHNIQUE: Multidetector CT imaging of the chest, abdomen and pelvis was performed following the standard protocol without IV contrast. COMPARISON:  11/10/2016 CT chest, abdomen and pelvis. FINDINGS: CT CHEST FINDINGS  Cardiovascular: Normal heart size. Small pericardial effusion/ thickening, slightly increased. Three-vessel coronary atherosclerosis. Right internal jugular MediPort terminates in the middle third of the superior vena cava. Atherosclerotic nonaneurysmal thoracic aorta. Stable dilated main pulmonary artery (3.5 cm diameter). Mediastinum/Nodes: Stable dominant hypodense 1.5 cm posterior right thyroid lobe nodule. Unremarkable esophagus. No pathologically enlarged axillary, mediastinal or gross hilar lymph nodes, noting limited sensitivity for the detection of hilar adenopathy on this noncontrast study. Lungs/Pleura: No pneumothorax. Trace dependent bilateral pleural effusions are new bilaterally. Moderate to severe centrilobular and paraseptal emphysema with mild diffuse bronchial wall thickening. Surgical sutures are noted in the posterior right upper lobe, lingula and bilateral lower lobes. There are several irregularly-shaped partially cavitary sub solid lung masses and pulmonary nodules scattered throughout both lungs, most of which appear mildly increased in size in the interval. For example, a dominant 7.8 x 3.9 cm posterior right lower lobe irregular pressure cavitary lung mass (series 6/ image 103), previously 6.1 x 3.7 cm, increased. Medial basilar right lower lobe 2.6 x 1.6 cm irregular nodule (series 6/image 120), previously 2.0 x 1.5 cm, mildly increased. Posterior left lower lobe 1.4 cm nodule (series 6/image 107), previously 1.1 cm, mildly increased. Stable bandlike foci of scarring in the lingula and posterior right upper lobe. Musculoskeletal: Large expansile mixed lytic and sclerotic left anterior fifth rib osseous lesion measures up to 2.7 cm diameter (series 6/image 80), previously 2.3 cm, increased. Stable minimally expansile sclerotic lesion in the left anterior sixth rib. No new thoracic osseous lesions. Moderate thoracic spondylosis. CT ABDOMEN PELVIS FINDINGS Hepatobiliary: Diffusely lobulated  liver contour suggesting cirrhosis. No liver masses. Stable expected pneumobilia throughout the mildly dilated intrahepatic bile ducts status post choledochojejunostomy. Cholecystectomy. Pancreas: Stable postsurgical changes from resection of the pancreatic head and pancreaticojejunostomy. Stable scattered gas in the dilated main pancreatic duct up to 6 mm diameter. No discrete mass in the remnant pancreatic body and tail. Spleen: Normal size. No mass. Adrenals/Urinary Tract: Stable 1.6 cm right adrenal nodule with density 3 HU suggesting a benign adenoma. Stable thickening of the left adrenal gland without discrete left adrenal nodule. Simple 1.9 cm anterior lower left renal cyst. No additional contour deforming renal lesions. No hydronephrosis. No renal stones. Normal bladder. Stomach/Bowel: Stable large supraumbilical midline ventral abdominal hernia containing a portion of the distal stomach and several small bowel loops. Stable postsurgical changes from gastrojejunostomy. No acute gastric abnormality. Normal caliber small bowel  loops with no definite small bowel wall thickening. Oral contrast reaches the rectum. Normal appendix. Normal large bowel with no diverticulosis, large bowel wall thickening or pericolonic fat stranding. Vascular/Lymphatic: Atherosclerotic abdominal aorta with stable 4.0 cm infrarenal abdominal aortic aneurysm. Previously described ill-defined soft tissue density surrounding the proximal superior mesenteric artery (series 2/ image 63) is poorly delineated on this noncontrast scan and appears grossly stable. No pathologically enlarged lymph nodes in the abdomen or pelvis. Reproductive: Status post hysterectomy, with no abnormal findings at the vaginal cuff. No adnexal mass. Other: No pneumoperitoneum. New small volume ascites. No focal fluid collection. Musculoskeletal: No aggressive appearing focal osseous lesions. Moderate lumbar spondylosis. IMPRESSION: 1. Pulmonary metastases appear  mildly increased. 2. Expansile anterior left fifth rib metastasis appears mildly increased. 3. 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 No noncontrast CT evidence of progressive metastatic disease in the abdomen or pelvis . 4. Evidence of fluid third-spacing, including new trace dependent bilateral pleural effusions, increased small pericardial effusion/thickening and new small volume ascites. 5. Morphologic changes suggesting cirrhosis . 6. Chronic findings include: Infrarenal 4.0 cm abdominal aortic aneurysm, stable. Aortic Atherosclerosis (ICD10-I70.0) and Emphysema (ICD10-J43.9). Coronary atherosclerosis . Electronically Signed   By: Ilona Sorrel M.D.   On: 02/02/2017 07:34   Ct Chest Wo Contrast  Result Date: 02/02/2017 CLINICAL DATA:  Metastatic pancreatic cancer. Whipple July 2010. Ongoing chemotherapy. Restaging. EXAM: CT CHEST, ABDOMEN AND PELVIS WITHOUT CONTRAST TECHNIQUE: Multidetector CT imaging of the chest, abdomen and pelvis was performed following the standard protocol without IV contrast. COMPARISON:  11/10/2016 CT chest, abdomen and pelvis. FINDINGS: CT CHEST FINDINGS Cardiovascular: Normal heart size. Small pericardial effusion/ thickening, slightly increased. Three-vessel coronary atherosclerosis. Right internal jugular MediPort terminates in the middle third of the superior vena cava. Atherosclerotic nonaneurysmal thoracic aorta. Stable dilated main pulmonary artery (3.5 cm diameter). Mediastinum/Nodes: Stable dominant hypodense 1.5 cm posterior right thyroid lobe nodule. Unremarkable esophagus. No pathologically enlarged axillary, mediastinal or gross hilar lymph nodes, noting limited sensitivity for the detection of hilar adenopathy on this noncontrast study. Lungs/Pleura: No pneumothorax. Trace dependent bilateral pleural effusions are new bilaterally. Moderate to severe centrilobular and paraseptal emphysema  with mild diffuse bronchial wall thickening. Surgical sutures are noted in the posterior right upper lobe, lingula and bilateral lower lobes. There are several irregularly-shaped partially cavitary sub solid lung masses and pulmonary nodules scattered throughout both lungs, most of which appear mildly increased in size in the interval. For example, a dominant 7.8 x 3.9 cm posterior right lower lobe irregular pressure cavitary lung mass (series 6/ image 103), previously 6.1 x 3.7 cm, increased. Medial basilar right lower lobe 2.6 x 1.6 cm irregular nodule (series 6/image 120), previously 2.0 x 1.5 cm, mildly increased. Posterior left lower lobe 1.4 cm nodule (series 6/image 107), previously 1.1 cm, mildly increased. Stable bandlike foci of scarring in the lingula and posterior right upper lobe. Musculoskeletal: Large expansile mixed lytic and sclerotic left anterior fifth rib osseous lesion measures up to 2.7 cm diameter (series 6/image 80), previously 2.3 cm, increased. Stable minimally expansile sclerotic lesion in the left anterior sixth rib. No new thoracic osseous lesions. Moderate thoracic spondylosis. CT ABDOMEN PELVIS FINDINGS Hepatobiliary: Diffusely lobulated liver contour suggesting cirrhosis. No liver masses. Stable expected pneumobilia throughout the mildly dilated intrahepatic bile ducts status post choledochojejunostomy. Cholecystectomy. Pancreas: Stable postsurgical changes from resection of the pancreatic head and pancreaticojejunostomy. Stable scattered gas in the dilated main pancreatic duct up to  6 mm diameter. No discrete mass in the remnant pancreatic body and tail. Spleen: Normal size. No mass. Adrenals/Urinary Tract: Stable 1.6 cm right adrenal nodule with density 3 HU suggesting a benign adenoma. Stable thickening of the left adrenal gland without discrete left adrenal nodule. Simple 1.9 cm anterior lower left renal cyst. No additional contour deforming renal lesions. No hydronephrosis. No  renal stones. Normal bladder. Stomach/Bowel: Stable large supraumbilical midline ventral abdominal hernia containing a portion of the distal stomach and several small bowel loops. Stable postsurgical changes from gastrojejunostomy. No acute gastric abnormality. Normal caliber small bowel loops with no definite small bowel wall thickening. Oral contrast reaches the rectum. Normal appendix. Normal large bowel with no diverticulosis, large bowel wall thickening or pericolonic fat stranding. Vascular/Lymphatic: Atherosclerotic abdominal aorta with stable 4.0 cm infrarenal abdominal aortic aneurysm. Previously described ill-defined soft tissue density surrounding the proximal superior mesenteric artery (series 2/ image 63) is poorly delineated on this noncontrast scan and appears grossly stable. No pathologically enlarged lymph nodes in the abdomen or pelvis. Reproductive: Status post hysterectomy, with no abnormal findings at the vaginal cuff. No adnexal mass. Other: No pneumoperitoneum. New small volume ascites. No focal fluid collection. Musculoskeletal: No aggressive appearing focal osseous lesions. Moderate lumbar spondylosis. IMPRESSION: 1. Pulmonary metastases appear mildly increased. 2. Expansile anterior left fifth rib metastasis appears mildly increased. 3. 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 No noncontrast CT evidence of progressive metastatic disease in the abdomen or pelvis . 4. Evidence of fluid third-spacing, including new trace dependent bilateral pleural effusions, increased small pericardial effusion/thickening and new small volume ascites. 5. Morphologic changes suggesting cirrhosis . 6. Chronic findings include: Infrarenal 4.0 cm abdominal aortic aneurysm, stable. Aortic Atherosclerosis (ICD10-I70.0) and Emphysema (ICD10-J43.9). Coronary atherosclerosis . Electronically Signed   By: Ilona Sorrel M.D.   On: 02/02/2017  07:34    Labs:  CBC: Recent Labs    01/03/17 0955 01/10/17 1114 01/17/17 1117 01/24/17 1029  WBC 5.2 4.2 3.2* 6.1  HGB 9.7* 9.6* 8.8* 9.2*  HCT 30.7* 31.7* 28.3* 30.4*  PLT 278 147* 62* 242    COAGS: Recent Labs    03/14/16 1732 03/15/16 0358 03/03/17 0936  INR 1.38  --  1.31  APTT  --  29 36    BMP: Recent Labs    01/03/17 0955 01/10/17 1114 01/17/17 1136 01/24/17 1029  NA 139 138 138 139  K 4.0 3.4* 3.1* 3.9  CL 113* 110 106 113*  CO2 20* 20* 24 20*  GLUCOSE 99 88 106* 87  BUN 15 12 18  22*  CALCIUM 8.4* 8.5* 8.7* 8.3*  CREATININE 1.17* 1.20* 1.21* 1.73*  GFRNONAA 43* 42* 42* 27*  GFRAA 50* 49* 48* 31*    LIVER FUNCTION TESTS: Recent Labs    01/03/17 0955 01/10/17 1114 01/17/17 1136 01/24/17 1029  BILITOT 0.6 0.7 0.7 0.5  AST 26 65* 68* 28  ALT 17 44 45 19  ALKPHOS 108 125 121 111  PROT 5.7* 6.1* 5.9* 5.7*  ALBUMIN 2.9* 2.8* 2.8* 2.8*    TUMOR MARKERS: Recent Labs    05/24/16 0921 06/21/16 0941 08/02/16 0837 08/16/16 0847  CA199 973* 492* 830* 669*    Assessment and Plan:  Hx pancreatic ca 2010 Metastasis to lung and bone LLL wedge resections 2012 and 2017 Left rib lesion/left chest wall mass enlarging Scheduled for biopsy today Risks and benefits discussed with the patient including, but not limited to bleeding, infection,  damage to adjacent structures or low yield requiring additional tests. All of the patient's questions were answered, patient is agreeable to proceed. Consent signed and in chart.   Thank you for this interesting consult.  I greatly enjoyed meeting [name] [name] and look forward to participating in their care.  A copy of this report was sent to the requesting provider on this date.  Electronically Signed: Lavonia Drafts, PA-C 03/03/2017, 10:26 AM   I spent a total of  30 Minutes   in face to face in clinical consultation, greater than 50% of which was counseling/coordinating care for left chest wall mass  bx

## 2017-03-03 NOTE — Sedation Documentation (Signed)
Patient is resting comfortably. 

## 2017-03-08 ENCOUNTER — Ambulatory Visit
Admission: RE | Admit: 2017-03-08 | Discharge: 2017-03-08 | Disposition: A | Discharge status: Home / Self Care | Payer: Medicare Other | Source: Ambulatory Visit | Attending: Radiation Oncology | Admitting: Radiation Oncology

## 2017-03-08 ENCOUNTER — Encounter: Payer: Self-pay | Admitting: Urology

## 2017-03-08 DIAGNOSIS — Z510 Encounter for antineoplastic radiation therapy: Secondary | ICD-10-CM | POA: Diagnosis not present

## 2017-03-08 DIAGNOSIS — C259 Malignant neoplasm of pancreas, unspecified: Secondary | ICD-10-CM | POA: Diagnosis not present

## 2017-03-08 DIAGNOSIS — C7951 Secondary malignant neoplasm of bone: Secondary | ICD-10-CM | POA: Diagnosis not present

## 2017-03-08 DIAGNOSIS — Z8507 Personal history of malignant neoplasm of pancreas: Secondary | ICD-10-CM | POA: Diagnosis not present

## 2017-03-08 NOTE — Progress Notes (Signed)
  Radiation Oncology         [phone] ________________________________  Name: [name] [name] MRN: [id number]  Date: 03/08/2017  DOB: [birth year]  SIMULATION AND TREATMENT PLANNING NOTE    ICD-10-CM   1. Left 5th rib metastasis (HCC) C79.51    DIAGNOSIS:  77 yo woman with metastatic pancreatic cancer and expansile left 5th rib metastasis  NARRATIVE:  The patient was brought to the [place].  Identity was confirmed.  All relevant records and images related to the planned course of therapy were reviewed.  The patient freely provided informed written consent to proceed with treatment after reviewing the details related to the planned course of therapy. The consent form was witnessed and verified by the simulation staff.  Then, the patient was set-up in a stable reproducible  supine position for radiation therapy.  CT images were obtained.  Surface markings were placed.  The CT images were loaded into the planning software.  Then the target and avoidance structures were contoured.  Treatment planning then occurred.  The radiation prescription was entered and confirmed.  Then, I designed and supervised the construction of a total of 3 medically necessary complex treatment devices.  I have requested : 3D Simulation  I have requested a DVH of the following structures: Left Lung, Heart, Target.   PLAN:  The patient will receive 30 Gy in 10 fraction.  ________________________________  Sheral Apley Tammi Klippel, M.D.

## 2017-03-08 NOTE — Progress Notes (Signed)
I called and spoke with [name] [name] in pathology at [facility] to request molecular studies, specifically MSI- HdMMR/PD-L1 panel be sent on recent biopsy specimen from 03/03/17.   Nicholos Johns, PA-C

## 2017-03-14 DIAGNOSIS — D492 Neoplasm of unspecified behavior of bone, soft tissue, and skin: Secondary | ICD-10-CM | POA: Diagnosis not present

## 2017-03-14 DIAGNOSIS — C253 Malignant neoplasm of pancreatic duct: Secondary | ICD-10-CM | POA: Diagnosis not present

## 2017-03-14 DIAGNOSIS — N183 Chronic kidney disease, stage 3 (moderate): Secondary | ICD-10-CM | POA: Diagnosis not present

## 2017-03-14 DIAGNOSIS — D631 Anemia in chronic kidney disease: Secondary | ICD-10-CM | POA: Diagnosis not present

## 2017-03-15 DIAGNOSIS — Z8507 Personal history of malignant neoplasm of pancreas: Secondary | ICD-10-CM | POA: Diagnosis not present

## 2017-03-15 DIAGNOSIS — C7951 Secondary malignant neoplasm of bone: Secondary | ICD-10-CM | POA: Diagnosis not present

## 2017-03-15 DIAGNOSIS — Z510 Encounter for antineoplastic radiation therapy: Secondary | ICD-10-CM | POA: Diagnosis not present

## 2017-03-15 DIAGNOSIS — C259 Malignant neoplasm of pancreas, unspecified: Secondary | ICD-10-CM | POA: Diagnosis not present

## 2017-03-16 DIAGNOSIS — Z510 Encounter for antineoplastic radiation therapy: Secondary | ICD-10-CM | POA: Diagnosis not present

## 2017-03-16 DIAGNOSIS — C259 Malignant neoplasm of pancreas, unspecified: Secondary | ICD-10-CM | POA: Diagnosis not present

## 2017-03-16 DIAGNOSIS — Z8507 Personal history of malignant neoplasm of pancreas: Secondary | ICD-10-CM | POA: Diagnosis not present

## 2017-03-16 DIAGNOSIS — C7951 Secondary malignant neoplasm of bone: Secondary | ICD-10-CM | POA: Diagnosis not present

## 2017-03-17 ENCOUNTER — Ambulatory Visit
Admission: RE | Admit: 2017-03-17 | Discharge: 2017-03-17 | Disposition: A | Discharge status: Home / Self Care | Payer: Medicare Other | Source: Ambulatory Visit | Attending: Radiation Oncology | Admitting: Radiation Oncology

## 2017-03-17 DIAGNOSIS — C7951 Secondary malignant neoplasm of bone: Secondary | ICD-10-CM

## 2017-03-17 DIAGNOSIS — Z510 Encounter for antineoplastic radiation therapy: Secondary | ICD-10-CM | POA: Diagnosis not present

## 2017-03-17 DIAGNOSIS — Z8507 Personal history of malignant neoplasm of pancreas: Secondary | ICD-10-CM | POA: Diagnosis not present

## 2017-03-17 MED ORDER — RADIAPLEXRX EX GEL
Freq: Once | CUTANEOUS | Status: AC
  Administered 2017-03-17: 16:00:00 via TOPICAL

## 2017-03-17 NOTE — Progress Notes (Signed)
Pt here for patient teaching.  Pt given Radiation and You booklet, skin care instructions and Radiaplex gel.  Reviewed areas of pertinence such as fatigue and skin changes . Pt able to give teach back of to pat skin and use unscented/gentle soap,radiaplex gel. Pt demonstrated understanding, needs reinforcement, no evidence of learning, refused teaching and  of information given and will contact nursing with any questions or concerns.     [email or web address]

## 2017-03-20 ENCOUNTER — Ambulatory Visit
Admission: RE | Admit: 2017-03-20 | Discharge: 2017-03-20 | Disposition: A | Discharge status: Home / Self Care | Payer: Medicare Other | Source: Ambulatory Visit | Attending: Radiation Oncology | Admitting: Radiation Oncology

## 2017-03-20 DIAGNOSIS — Z8507 Personal history of malignant neoplasm of pancreas: Secondary | ICD-10-CM | POA: Diagnosis not present

## 2017-03-20 DIAGNOSIS — C7951 Secondary malignant neoplasm of bone: Secondary | ICD-10-CM | POA: Diagnosis not present

## 2017-03-20 DIAGNOSIS — Z510 Encounter for antineoplastic radiation therapy: Secondary | ICD-10-CM | POA: Diagnosis not present

## 2017-03-21 ENCOUNTER — Ambulatory Visit
Admission: RE | Admit: 2017-03-21 | Discharge: 2017-03-21 | Disposition: A | Discharge status: Home / Self Care | Payer: Medicare Other | Source: Ambulatory Visit | Attending: Radiation Oncology | Admitting: Radiation Oncology

## 2017-03-21 DIAGNOSIS — Z8507 Personal history of malignant neoplasm of pancreas: Secondary | ICD-10-CM | POA: Diagnosis not present

## 2017-03-21 DIAGNOSIS — C7951 Secondary malignant neoplasm of bone: Secondary | ICD-10-CM | POA: Diagnosis not present

## 2017-03-21 DIAGNOSIS — Z510 Encounter for antineoplastic radiation therapy: Secondary | ICD-10-CM | POA: Diagnosis not present

## 2017-03-22 ENCOUNTER — Ambulatory Visit
Admission: RE | Admit: 2017-03-22 | Discharge: 2017-03-22 | Disposition: A | Discharge status: Home / Self Care | Payer: Medicare Other | Source: Ambulatory Visit | Attending: Radiation Oncology | Admitting: Radiation Oncology

## 2017-03-22 DIAGNOSIS — Z8507 Personal history of malignant neoplasm of pancreas: Secondary | ICD-10-CM | POA: Diagnosis not present

## 2017-03-22 DIAGNOSIS — C259 Malignant neoplasm of pancreas, unspecified: Secondary | ICD-10-CM | POA: Diagnosis not present

## 2017-03-22 DIAGNOSIS — C7951 Secondary malignant neoplasm of bone: Secondary | ICD-10-CM | POA: Diagnosis not present

## 2017-03-22 DIAGNOSIS — Z510 Encounter for antineoplastic radiation therapy: Secondary | ICD-10-CM | POA: Diagnosis not present

## 2017-03-23 ENCOUNTER — Ambulatory Visit
Admission: RE | Admit: 2017-03-23 | Discharge: 2017-03-23 | Disposition: A | Discharge status: Home / Self Care | Payer: Medicare Other | Source: Ambulatory Visit | Attending: Radiation Oncology | Admitting: Radiation Oncology

## 2017-03-23 DIAGNOSIS — Z8507 Personal history of malignant neoplasm of pancreas: Secondary | ICD-10-CM | POA: Diagnosis not present

## 2017-03-23 DIAGNOSIS — C7951 Secondary malignant neoplasm of bone: Secondary | ICD-10-CM | POA: Diagnosis not present

## 2017-03-23 DIAGNOSIS — Z510 Encounter for antineoplastic radiation therapy: Secondary | ICD-10-CM | POA: Diagnosis not present

## 2017-03-24 ENCOUNTER — Ambulatory Visit
Admission: RE | Admit: 2017-03-24 | Discharge: 2017-03-24 | Disposition: A | Discharge status: Home / Self Care | Payer: Medicare Other | Source: Ambulatory Visit | Attending: Radiation Oncology | Admitting: Radiation Oncology

## 2017-03-24 DIAGNOSIS — Z8507 Personal history of malignant neoplasm of pancreas: Secondary | ICD-10-CM | POA: Diagnosis not present

## 2017-03-24 DIAGNOSIS — C7951 Secondary malignant neoplasm of bone: Secondary | ICD-10-CM | POA: Diagnosis not present

## 2017-03-24 DIAGNOSIS — Z510 Encounter for antineoplastic radiation therapy: Secondary | ICD-10-CM | POA: Diagnosis not present

## 2017-03-27 ENCOUNTER — Ambulatory Visit
Admission: RE | Admit: 2017-03-27 | Discharge: 2017-03-27 | Disposition: A | Discharge status: Home / Self Care | Payer: Medicare Other | Source: Ambulatory Visit | Attending: Radiation Oncology | Admitting: Radiation Oncology

## 2017-03-27 DIAGNOSIS — C7951 Secondary malignant neoplasm of bone: Secondary | ICD-10-CM | POA: Diagnosis not present

## 2017-03-27 DIAGNOSIS — Z510 Encounter for antineoplastic radiation therapy: Secondary | ICD-10-CM | POA: Diagnosis not present

## 2017-03-27 DIAGNOSIS — Z8507 Personal history of malignant neoplasm of pancreas: Secondary | ICD-10-CM | POA: Diagnosis not present

## 2017-03-28 ENCOUNTER — Ambulatory Visit
Admission: RE | Admit: 2017-03-28 | Discharge: 2017-03-28 | Disposition: A | Discharge status: Home / Self Care | Payer: Medicare Other | Source: Ambulatory Visit | Attending: Radiation Oncology | Admitting: Radiation Oncology

## 2017-03-28 DIAGNOSIS — C7951 Secondary malignant neoplasm of bone: Secondary | ICD-10-CM | POA: Diagnosis not present

## 2017-03-28 DIAGNOSIS — Z8507 Personal history of malignant neoplasm of pancreas: Secondary | ICD-10-CM | POA: Diagnosis not present

## 2017-03-28 DIAGNOSIS — Z510 Encounter for antineoplastic radiation therapy: Secondary | ICD-10-CM | POA: Diagnosis not present

## 2017-03-29 ENCOUNTER — Encounter: Payer: Self-pay | Admitting: Radiation Oncology

## 2017-03-29 ENCOUNTER — Ambulatory Visit
Admission: RE | Admit: 2017-03-29 | Discharge: 2017-03-29 | Disposition: A | Discharge status: Home / Self Care | Payer: Medicare Other | Source: Ambulatory Visit | Attending: Radiation Oncology | Admitting: Radiation Oncology

## 2017-03-29 DIAGNOSIS — C259 Malignant neoplasm of pancreas, unspecified: Secondary | ICD-10-CM | POA: Diagnosis not present

## 2017-03-29 DIAGNOSIS — Z510 Encounter for antineoplastic radiation therapy: Secondary | ICD-10-CM | POA: Diagnosis not present

## 2017-03-29 DIAGNOSIS — Z8507 Personal history of malignant neoplasm of pancreas: Secondary | ICD-10-CM | POA: Diagnosis not present

## 2017-03-29 DIAGNOSIS — C7951 Secondary malignant neoplasm of bone: Secondary | ICD-10-CM | POA: Diagnosis not present

## 2017-03-30 NOTE — Progress Notes (Signed)
  Radiation Oncology         [phone]) [phone] ________________________________  Name: [name] [name] MRN: [id number]  Date: 03/29/2017  DOB: Jul 07, 1940  End of Treatment Note  Diagnosis:   77 y.o. female with metastatic pancreatic cancer and expansile left 5th rib metastasis     Indication for treatment:  Palliative       Radiation treatment dates:   03/16/2017 - 03/29/2017  Site/dose:   The left 5th rib was treated to 30 Gy in 10 fractions of 3 Gy.  Beams/energy:   Isodose Plan / 6X Photon  Narrative: The patient tolerated radiation treatment relatively well.   She experienced mild fatigue and had some hyperpigmentation in the treatment field that was treated with Radiaplex gel. She reported mild pain in her left rib area which was managed with Aleve, and some difficulty with deep inspiration. She denied dysphagia, cough or hemoptysis.  Plan: The patient has completed radiation treatment. The patient will return to radiation oncology clinic for routine followup in one month. I advised her to call or return sooner if she has any questions or concerns related to her recovery or treatment. ________________________________  Sheral Apley. Tammi Klippel, M.D.  This document serves as a record of services personally performed by Tyler Pita, MD. It was created on his behalf by Rae Lips, a trained medical scribe. The creation of this record is based on the scribe's personal observations and the provider's statements to them. This document has been checked and approved by the attending provider.

## 2017-03-31 ENCOUNTER — Encounter (HOSPITAL_COMMUNITY): Payer: Self-pay

## 2017-04-04 DIAGNOSIS — Z5112 Encounter for antineoplastic immunotherapy: Secondary | ICD-10-CM | POA: Diagnosis not present

## 2017-04-04 DIAGNOSIS — C253 Malignant neoplasm of pancreatic duct: Secondary | ICD-10-CM | POA: Diagnosis not present

## 2017-04-04 DIAGNOSIS — C3490 Malignant neoplasm of unspecified part of unspecified bronchus or lung: Secondary | ICD-10-CM | POA: Diagnosis not present

## 2017-04-04 DIAGNOSIS — D631 Anemia in chronic kidney disease: Secondary | ICD-10-CM | POA: Diagnosis not present

## 2017-04-11 ENCOUNTER — Other Ambulatory Visit: Payer: Self-pay

## 2017-04-11 ENCOUNTER — Observation Stay (HOSPITAL_COMMUNITY)
Admission: EM | Admit: 2017-04-11 | Discharge: 2017-04-13 | Disposition: A | Discharge status: Home / Self Care | Payer: Medicare Other | Attending: Internal Medicine | Admitting: Internal Medicine

## 2017-04-11 ENCOUNTER — Emergency Department (HOSPITAL_COMMUNITY): Payer: Medicare Other

## 2017-04-11 ENCOUNTER — Encounter (HOSPITAL_COMMUNITY): Payer: Self-pay | Admitting: Emergency Medicine

## 2017-04-11 DIAGNOSIS — I10 Essential (primary) hypertension: Secondary | ICD-10-CM | POA: Diagnosis present

## 2017-04-11 DIAGNOSIS — R0600 Dyspnea, unspecified: Secondary | ICD-10-CM | POA: Diagnosis not present

## 2017-04-11 DIAGNOSIS — C7800 Secondary malignant neoplasm of unspecified lung: Secondary | ICD-10-CM | POA: Diagnosis not present

## 2017-04-11 DIAGNOSIS — Z87891 Personal history of nicotine dependence: Secondary | ICD-10-CM | POA: Diagnosis not present

## 2017-04-11 DIAGNOSIS — Z7901 Long term (current) use of anticoagulants: Secondary | ICD-10-CM | POA: Insufficient documentation

## 2017-04-11 DIAGNOSIS — J439 Emphysema, unspecified: Secondary | ICD-10-CM | POA: Diagnosis not present

## 2017-04-11 DIAGNOSIS — R0602 Shortness of breath: Secondary | ICD-10-CM | POA: Diagnosis not present

## 2017-04-11 DIAGNOSIS — R7989 Other specified abnormal findings of blood chemistry: Secondary | ICD-10-CM | POA: Diagnosis not present

## 2017-04-11 DIAGNOSIS — C259 Malignant neoplasm of pancreas, unspecified: Secondary | ICD-10-CM | POA: Diagnosis not present

## 2017-04-11 DIAGNOSIS — K219 Gastro-esophageal reflux disease without esophagitis: Secondary | ICD-10-CM | POA: Diagnosis present

## 2017-04-11 DIAGNOSIS — D649 Anemia, unspecified: Secondary | ICD-10-CM | POA: Diagnosis present

## 2017-04-11 DIAGNOSIS — Z79899 Other long term (current) drug therapy: Secondary | ICD-10-CM | POA: Insufficient documentation

## 2017-04-11 DIAGNOSIS — Z8507 Personal history of malignant neoplasm of pancreas: Secondary | ICD-10-CM | POA: Diagnosis not present

## 2017-04-11 DIAGNOSIS — R778 Other specified abnormalities of plasma proteins: Secondary | ICD-10-CM

## 2017-04-11 LAB — CBC WITH DIFFERENTIAL/PLATELET
Basophils Absolute: 0 10*3/uL (ref 0.0–0.1)
Basophils Relative: 0 %
EOS ABS: 0.2 10*3/uL (ref 0.0–0.7)
EOS PCT: 3 %
HCT: 29.7 % — ABNORMAL LOW (ref 36.0–46.0)
Hemoglobin: 9.4 g/dL — ABNORMAL LOW (ref 12.0–15.0)
Lymphocytes Relative: 8 %
Lymphs Abs: 0.7 10*3/uL (ref 0.7–4.0)
MCH: 32 pg (ref 26.0–34.0)
MCHC: 31.6 g/dL (ref 30.0–36.0)
MCV: 101 fL — ABNORMAL HIGH (ref 78.0–100.0)
MONOS PCT: 13 %
Monocytes Absolute: 1 10*3/uL (ref 0.1–1.0)
Neutro Abs: 6.1 10*3/uL (ref 1.7–7.7)
Neutrophils Relative %: 76 %
PLATELETS: 168 10*3/uL (ref 150–400)
RBC: 2.94 MIL/uL — ABNORMAL LOW (ref 3.87–5.11)
RDW: 15.1 % (ref 11.5–15.5)
WBC: 8.1 10*3/uL (ref 4.0–10.5)

## 2017-04-11 LAB — BASIC METABOLIC PANEL
Anion gap: 10 (ref 5–15)
BUN: 30 mg/dL — ABNORMAL HIGH (ref 6–20)
CO2: 17 mmol/L — ABNORMAL LOW (ref 22–32)
CREATININE: 1.46 mg/dL — AB (ref 0.44–1.00)
Calcium: 9.1 mg/dL (ref 8.9–10.3)
Chloride: 111 mmol/L (ref 101–111)
GFR calc Af Amer: 39 mL/min — ABNORMAL LOW (ref >60)
GFR, EST NON AFRICAN AMERICAN: 33 mL/min — AB (ref >60)
Glucose, Bld: 143 mg/dL — ABNORMAL HIGH (ref 65–99)
Potassium: 4.7 mmol/L (ref 3.5–5.1)
SODIUM: 138 mmol/L (ref 135–145)

## 2017-04-11 LAB — TROPONIN I: TROPONIN I: 0.14 ng/mL — AB (ref <0.03)

## 2017-04-11 MED ORDER — IPRATROPIUM-ALBUTEROL 0.5-2.5 (3) MG/3ML IN SOLN: 0.5000 mg | Freq: Four times a day (QID) | RESPIRATORY_TRACT | Status: DC | PRN

## 2017-04-11 MED ORDER — IOPAMIDOL (ISOVUE-370) INJECTION 76%
100.0000 mL | Freq: Once | INTRAVENOUS | Status: AC | PRN
  Administered 2017-04-11: 80 mL via INTRAVENOUS

## 2017-04-11 MED ORDER — ALBUTEROL SULFATE (2.5 MG/3ML) 0.083% IN NEBU: 2.5000 mg | INHALATION_SOLUTION | Freq: Four times a day (QID) | RESPIRATORY_TRACT | Status: DC | PRN

## 2017-04-11 NOTE — ED Notes (Signed)
CRITICAL VALUE ALERT  Critical Value:  Trop 0.14  Date & Time Notied:  04/11/17 2103  Provider Notified: Lacinda Axon  Orders Received/Actions taken: na

## 2017-04-11 NOTE — ED Triage Notes (Signed)
Shortness of breath x 3-4 days.  Pt states sob is worse when walking around.  resp even and non labored

## 2017-04-11 NOTE — H&P (Signed)
History and Physical    [name] [name] XBM:[id number] DOB: 10-12-1940 DOA: 04/11/2017  PCP: Monico Blitz, MD   Patient coming from: Home.  I have personally briefly reviewed patient's old medical records in [place]  Chief Complaint: Shortness of breath.  HPI: [name] [name] is a 77 y.o. female with medical history significant of abdominal wall hernia, anemia, aortic aneurysm, osteoarthritis, history of cervical cancer, history of lung cancer, COPD/emphysema, GERD, hyperlipidemia, hypertension, history of low B12 level, pancreatic carcinoma metastasized to the lung who is coming to the emergency department with complaints of progressively worse dyspnea for the past 3-4 days, which worsens when she is exerting.  She mentions that at times she has a non-radiated pressure-like sensation in her chest.  She denies fever, chills, productive cough, wheezing, hemoptysis, nausea, emesis, constipation, melena or hematochezia.  She gets frequent diarrhea which is a chronic symptom.  Denies dysuria, frequency or hematuria.  No heat or cold intolerance.  No polyuria, polydipsia or blurred vision.   ED Course: Initial vital signs in the emergency department temperature 98.67F, pulse 71, respirations 19, blood pressure 156/86 mmHg and O2 sat 98% on room air.  Her white count was 8.1, with a normal differential, hemoglobin 9.4 g/dL and platelets 168.  Sodium is 138, potassium 4.7, chloride 111 and CO2 17 mmol/L.  BUN was 30, creatinine 1.46, calcium 9.1 and glucose 143 mg/dL.  Troponin level was 0.14.  EKG was normal sinus rhythm.  CT ANGIOGRAPHY CHEST WITH CONTRAST  IMPRESSION: 1. No pulmonary embolus. 2. Progression of intrathoracic metastatic disease with increased size of pulmonary nodules and masses. 3. Expansile left fifth rib lesion is unchanged. 4. Small pleural effusions are unchanged. 5. Advanced emphysema. Aortic atherosclerosis and coronary artery calcifications.  Review  of Systems: As per HPI otherwise 10 point review of systems negative.    Past Medical History:  Diagnosis Date  . Abdominal wall hernia   . Anemia   . Anemia due to antineoplastic chemotherapy 07/05/2016  . Aortic aneurysm (HCC)    small aortic aneurysm  . Arthritis    "little in my right hand" (03/10/2016)  . Cervical cancer ([place]) 1963  . COPD (chronic obstructive pulmonary disease) ([place])   . Emphysema   . GERD (gastroesophageal reflux disease)   . Goals of care, counseling/discussion 02/02/2016  . Hypercholesterolemia   . Hypertension   . Low serum vitamin B12 01/20/2016  . Pancreatic adenocarcinoma [facility]) July 2010  . Pancreatic cancer metastasized to lung [facility]) 09/04/2015    Past Surgical History:  Procedure Laterality Date  . BREAST CYST EXCISION Right 1988  . BUNIONECTOMY Bilateral 2007  . CARDIAC CATHETERIZATION    . CATARACT EXTRACTION W/ INTRAOCULAR LENS  IMPLANT, BILATERAL Bilateral 2009  . CHOLECYSTECTOMY  July 2010   done w/ Whipple procedure  . IR GENERIC HISTORICAL  03/15/2016   IR GUIDED DRAIN W CATHETER PLACEMENT 03/15/2016 Greggory Keen, MD [facility]-INTERV RAD  . SKIN CANCER EXCISION Left    nasal fold  . STAPLING OF BLEBS Left 04/20/2015   Procedure: STAPLING OF BLEBS;  Surgeon: Ivin Poot, MD;  Location: [place];  Service: Thoracic;  Laterality: Left;  . THYROID SURGERY     "had a growth taken off"  . TONSILLECTOMY  1943  . VAGINAL HYSTERECTOMY  1963   Cervical Cancer  . VIDEO ASSISTED THORACOSCOPY Left 04/20/2015   Procedure: VIDEO ASSISTED THORACOSCOPY;  Surgeon: Ivin Poot, MD;  Location: [place];  Service: Thoracic;  Laterality:  Left;  . WHIPPLE PROCEDURE  July 2010   pancreatic adenocarcinoma     reports that she quit smoking about 21 years ago. Her smoking use included cigarettes. She has a 30.00 pack-year smoking history. she has never used smokeless tobacco. She reports that she does not drink alcohol or use drugs.  Allergies  Allergen Reactions    . Oxaliplatin Anaphylaxis  . Pantoprazole Other (See Comments)    THIS GIVES THE PATIENT TERRIBLE HEARTBURN (PLEASE DO NOT GIVE)  . Adhesive [Tape] Rash  . Aspirin Other (See Comments)    Burns stomach  . Neulasta [Pegfilgrastim] Other (See Comments)    UNSURE   . Oxycodone Nausea And Vomiting  . Tramadol Nausea And Vomiting    Family History  Problem Relation Age of Onset  . Pancreatic cancer Brother   . Colon cancer Sister 14       12/2006 dx surgery and chemotherapy  . Ovarian cancer Sister 9       Ovarian ca,  surgery and chemotherapy  . Ovarian cancer Other   . Cancer Other        lung to brain    Prior to Admission medications   Medication Sig Start Date End Date Taking? Authorizing Provider  acetaminophen (TYLENOL) 325 MG tablet Take 2 tablets (650 mg total) by mouth every 6 (six) hours as needed for mild pain (or Fever >/= 101). 03/18/16  Yes Lars Pinks M, PA-C  amoxicillin (AMOXIL) 500 MG capsule Take 2,000 mg by mouth See admin instructions. For Dental Procedures   Yes [provider]  cholecalciferol (VITAMIN D) 1000 units tablet Take 2,000 Units by mouth daily.   Yes [provider]  CREON 36000 units CPEP capsule TAKE 1 CAPSULE THREE TIMES A DAY WITH MEALS 08/30/16  Yes Kefalas, Manon Hilding, PA-C  furosemide (LASIX) 20 MG tablet Take 20 mg by mouth daily.    Yes [provider]  hydrochlorothiazide (HYDRODIURIL) 25 MG tablet Take 12.5 mg by mouth every morning.    Yes [provider]  loperamide (ANTI-DIARRHEAL) 2 MG capsule Take 2 mg by mouth 3 (three) times daily with meals. *May take as needed   Yes [provider]  losartan (COZAAR) 100 MG tablet Take 100 mg by mouth every morning.  08/31/15  Yes [provider]  metoprolol succinate (TOPROL-XL) 50 MG 24 hr tablet Take 25 mg by mouth every morning. Take with or immediately following a meal.    Yes [provider]  naproxen sodium (ALEVE) 220 MG  tablet Take 220 mg by mouth daily as needed (for pain).    Yes [provider]  omeprazole (PRILOSEC) 20 MG capsule Take 20 mg by mouth daily before breakfast.    Yes [provider]  potassium chloride SA (K-DUR,KLOR-CON) 20 MEQ tablet Take 20 mEq by mouth daily.    Yes [provider]  XARELTO 20 MG TABS tablet Take 1 tablet (20 mg total) by mouth daily with supper. 11/03/16  Yes Holley Bouche, NP  Gemcitabine HCl (GEMZAR IV) Inject into the vein.    [provider]    Physical Exam: Vitals:   04/11/17 2130 04/11/17 2150 04/11/17 2245 04/11/17 2300  BP: (!) 154/64     Pulse: 71 66 67 68  Resp: (!) 25 16 (!) 24 18  Temp:      TempSrc:      SpO2: 97% 100% 98% 96%  Weight:      Height:  Constitutional: NAD, calm, comfortable Eyes: PERRL, lids and conjunctivae normal ENMT: Mucous membranes are moist. Posterior pharynx clear of any exudate or lesions. Neck: normal, supple, no masses, no thyromegaly Respiratory: Good air movement. no rhonchi or wheezing. Normal respiratory effort. No accessory muscle use.  Cardiovascular: Regular rate and rhythm, no murmurs / rubs / gallops. No extremity edema. 2+ pedal pulses. No carotid bruits.  Abdomen: Soft, mild epigastric and LUQ tenderness, no masses palpated. No hepatosplenomegaly. Bowel sounds positive.  Musculoskeletal: no clubbing / cyanosis.  Good ROM, no contractures. Normal muscle tone.  Skin: no rashes, lesions, ulcers on limited dermatological exam. Neurologic: CN 2-12 grossly intact. Sensation intact, DTR normal. Strength 5/5 in all 4.  Psychiatric: Normal judgment and insight. Alert and oriented x 3. Normal mood.    Labs on Admission: I have personally reviewed following labs and imaging studies  CBC: Recent Labs  Lab 04/11/17 1608  WBC 8.1  NEUTROABS 6.1  HGB 9.4*  HCT 29.7*  MCV 101.0*  PLT 397   Basic Metabolic Panel: Recent Labs  Lab 04/11/17 1608  NA 138  K 4.7  CL  111  CO2 17*  GLUCOSE 143*  BUN 30*  CREATININE 1.46*  CALCIUM 9.1   GFR: Estimated Creatinine Clearance: 24.6 mL/min (A) (by C-G formula based on SCr of 1.46 mg/dL (H)). Liver Function Tests: No results for input(s): AST, ALT, ALKPHOS, BILITOT, PROT, ALBUMIN in the last 168 hours. No results for input(s): LIPASE, AMYLASE in the last 168 hours. No results for input(s): AMMONIA in the last 168 hours. Coagulation Profile: No results for input(s): INR, PROTIME in the last 168 hours. Cardiac Enzymes: Recent Labs  Lab 04/11/17 2013  TROPONINI 0.14*   BNP (last 3 results) No results for input(s): PROBNP in the last 8760 hours. HbA1C: No results for input(s): HGBA1C in the last 72 hours. CBG: No results for input(s): GLUCAP in the last 168 hours. Lipid Profile: No results for input(s): CHOL, HDL, LDLCALC, TRIG, CHOLHDL, LDLDIRECT in the last 72 hours. Thyroid Function Tests: No results for input(s): TSH, T4TOTAL, FREET4, T3FREE, THYROIDAB in the last 72 hours. Anemia Panel: No results for input(s): VITAMINB12, FOLATE, FERRITIN, TIBC, IRON, RETICCTPCT in the last 72 hours. Urine analysis:    Component Value Date/Time   COLORURINE YELLOW 04/19/2015 2111   APPEARANCEUR CLEAR 04/19/2015 2111   LABSPEC 1.013 04/19/2015 2111   PHURINE 5.0 04/19/2015 2111   GLUCOSEU NEGATIVE 04/19/2015 2111   HGBUR NEGATIVE 04/19/2015 2111   [place] NEGATIVE 04/19/2015 2111   KETONESUR NEGATIVE 04/19/2015 2111   PROTEINUR NEGATIVE 04/19/2015 2111   UROBILINOGEN 0.2 06/01/2010 0931   NITRITE NEGATIVE 04/19/2015 2111   LEUKOCYTESUR TRACE (A) 04/19/2015 2111    Radiological Exams on Admission: Dg Chest 2 View  Result Date: 04/11/2017 CLINICAL DATA:  Shortness of breath. Metastatic pancreatic cancer. History of COPD. EXAM: CHEST  2 VIEW COMPARISON:  CT chest dated February 01, 2017. Chest x-ray dated Jul 20, 2016. FINDINGS: Unchanged right chest wall port catheter with the tip in the distal  SVC. The heart size and mediastinal contours are within normal limits. Normal pulmonary vascularity. Postsurgical changes with pleuroparenchymal scarring in both lungs are grossly unchanged. The lungs remain hyperinflated with severe emphysematous changes. No pleural effusion or pneumothorax. Sclerotic, expansile, left anterior fifth rib mass appears grossly unchanged. Large ventral abdominal hernia. IMPRESSION: 1. No definite acute cardiopulmonary process. COPD. Chronic postsurgical changes and pleuroparenchymal scarring is grossly unchanged. 2. Large, expansile, left anterior fifth rib metastasis is  also grossly unchanged. Electronically Signed   By: Titus Dubin M.D.   On: 04/11/2017 16:31   Ct Angio Chest Pe W And/or Wo Contrast  Result Date: 04/11/2017 CLINICAL DATA:  Shortness of breath. History of pancreatic cancer with metastatic disease to the thorax. EXAM: CT ANGIOGRAPHY CHEST WITH CONTRAST TECHNIQUE: Multidetector CT imaging of the chest was performed using the standard protocol during bolus administration of intravenous contrast. Multiplanar CT image reconstructions and MIPs were obtained to evaluate the vascular anatomy. CONTRAST:  48mL ISOVUE-370 IOPAMIDOL (ISOVUE-370) INJECTION 76% COMPARISON:  Radiograph earlier this day.  Chest CT 02/01/2017 FINDINGS: Cardiovascular: There are no filling defects within the pulmonary arteries to suggest pulmonary embolus. Atherosclerosis of the thoracic aorta without aneurysm or dissection. The heart is normal in size. There are coronary artery calcifications. Small pericardial effusion. Right chest port with tip in the SVC. Mediastinum/Nodes: No pathologically enlarged mediastinal or hilar lymph nodes. Small anterior mediastinal nodes are unchanged from prior exam. There is no axillary adenopathy. Previous right thyroid nodule is not as well-defined on the current exam. The esophagus is decompressed. Lungs/Pleura: Advanced emphysema. Right upper lobe scarring  with surgical staple line. Multiple irregularly-shaped partially cavitary lung masses and pulmonary nodules throughout both lungs are again seen. Partially cavitary irregular posterior right lower lobe mass measures 9.0 x 3.9 cm image 101, increased from a 7.8 x 3.9 cm. Definite increased size and density of the medial basilar right lower lobe irregular nodular opacity image 112 series 7 currently 2.8 x 2.1 cm, previously 2.6 x 1.6 cm. Peripheral left lower lobe pulmonary nodule central cavitation image 84 measures 1.9 x 1.3 cm, previously 1.4 x 0.9 cm. Increased soft tissue density in the lingula subjacent to left rib lesion. Additional smaller nodules throughout both lungs have also increased in size. Small pleural effusions are not significantly changed. No pneumothorax. The trachea and mainstem bronchi are patent. Upper Abdomen: Chronic pneumobilia. Post Whipple procedure with postsurgical change in the pancreas. Musculoskeletal: Expansile left anterior fifth rib lesion is unchanged measuring 2.7 cm. Sclerotic adjacent left anterior sixth rib lesion is unchanged. Remote left posterior rib fracture. Scoliosis and degenerative change in the spine. No evidence of new osseous abnormality. Review of the MIP images confirms the above findings. IMPRESSION: 1. No pulmonary embolus. 2. Progression of intrathoracic metastatic disease with increased size of pulmonary nodules and masses. 3. Expansile left fifth rib lesion is unchanged. 4. Small pleural effusions are unchanged. 5. Advanced emphysema. Aortic atherosclerosis and coronary artery calcifications. Electronically Signed   By: Jeb Levering M.D.   On: 04/11/2017 21:11    EKG: Independently reviewed.   Rate: 64  Rhythm: normal sinus rhythm  QRS Axis: normal  Intervals: normal  ST/T Wave abnormalities: normal  Conduction Disutrbances: PAC  Narrative Interpretation: unremarkable   Assessment/Plan Principal Problem:   Dyspnea Secondary to emphysema  and probably cardiac etiology. Also, increased tumor growth may be affecting cardiopulmonary structures. Continue supplemental oxygen. Trend troponin level. Check echocardiogram.  Active Problems:   Elevated troponin Due to demand ischemia. Trend troponin level.    Emphysema lung ([place]) Supplemental oxygen as needed. Bronchodilators as needed.    Hypertension Continue losartan 100 mg p.o. daily. Continue HCTZ 12.5 mg p.o. daily. Continue furosemide 20 mg p.o. daily. Continue metoprolol 25 mg p.o. daily. Monitor blood pressure, heart rate, renal function and electrolytes.    Pancreatic cancer metastasized to lung [facility]) Continue follow-up with oncology as scheduled. Currently reported as progressing on CT chest. Consider palliative care evaluation.  GERD (gastroesophageal reflux disease) Famotidine 20 mg p.o. twice daily    Anemia Anemia panel was normal in November 2017 Monitor hematocrit and hemoglobin.     DVT prophylaxis: On Xarelto. Code Status: Full code. Family Communication: Her sister was present in the room. Disposition Plan: Observation for troponin level trending Consults called: Routine cardiology consult. Admission status: Observation/telemetry.   Reubin Milan MD [facility] Pager [phone].  If 7PM-7AM, please contact night-coverage [email or web address] Password [facility]  04/11/2017, 11:32 PM

## 2017-04-11 NOTE — ED Provider Notes (Signed)
[facility] EMERGENCY DEPARTMENT Provider Note   CSN: [id number] Arrival date & time: 04/11/17  1544     History   Chief Complaint Chief Complaint  Patient presents with  . Shortness of Breath    HPI [name] [name] is a 77 y.o. female.  Patient presents with dyspnea for 3-4 days.  Symptoms are worse with exertion.  No anterior chest pain, fever, sweats, chills, cough.  She has a known history of metastatic pancreatic cancer.  She is presently on Xarelto.      Past Medical History:  Diagnosis Date  . Abdominal wall hernia   . Anemia   . Anemia due to antineoplastic chemotherapy 07/05/2016  . Aortic aneurysm (HCC)    small aortic aneurysm  . Arthritis    "little in my right hand" (03/10/2016)  . Cervical cancer ([place]) 1963  . COPD (chronic obstructive pulmonary disease) ([place])   . Emphysema   . GERD (gastroesophageal reflux disease)   . Goals of care, counseling/discussion 02/02/2016  . Hypercholesterolemia   . Hypertension   . Low serum vitamin B12 01/20/2016  . Pancreatic adenocarcinoma [facility]) July 2010  . Pancreatic cancer metastasized to lung [facility]) 09/04/2015    Patient Active Problem List   Diagnosis Date Noted  . Dyspnea 04/11/2017  . GERD (gastroesophageal reflux disease) 04/11/2017  . Anemia 04/11/2017  . Elevated troponin 04/11/2017  . Left 5th rib metastasis ([place]) 12/05/2016  . Anemia due to antineoplastic chemotherapy 07/05/2016  . Malnutrition of moderate degree 03/16/2016  . Recurrent spontaneous pneumothorax 03/14/2016  . Pneumothorax on the right 03/05/2016  . Goals of care, counseling/discussion 02/02/2016  . Low serum vitamin B12 01/20/2016  . Low blood potassium 12/08/2015  . Pancreatic cancer metastasized to lung ([place]) 09/04/2015  . Pneumothorax, left 04/06/2015  . Cervical cancer ([place])   . Emphysema lung ([place])   . Hypertension   . Lung cancer ([place]) 06/11/2010  . Cancer ([place]) 08/21/2008    Past Surgical History:  Procedure Laterality  Date  . BREAST CYST EXCISION Right 1988  . BUNIONECTOMY Bilateral 2007  . CARDIAC CATHETERIZATION    . CATARACT EXTRACTION W/ INTRAOCULAR LENS  IMPLANT, BILATERAL Bilateral 2009  . CHOLECYSTECTOMY  July 2010   done w/ Whipple procedure  . IR GENERIC HISTORICAL  03/15/2016   IR GUIDED DRAIN W CATHETER PLACEMENT 03/15/2016 Greggory Keen, MD [facility]-INTERV RAD  . SKIN CANCER EXCISION Left    nasal fold  . STAPLING OF BLEBS Left 04/20/2015   Procedure: STAPLING OF BLEBS;  Surgeon: Ivin Poot, MD;  Location: [place];  Service: Thoracic;  Laterality: Left;  . THYROID SURGERY     "had a growth taken off"  . TONSILLECTOMY  1943  . VAGINAL HYSTERECTOMY  1963   Cervical Cancer  . VIDEO ASSISTED THORACOSCOPY Left 04/20/2015   Procedure: VIDEO ASSISTED THORACOSCOPY;  Surgeon: Ivin Poot, MD;  Location: [place];  Service: Thoracic;  Laterality: Left;  . WHIPPLE PROCEDURE  July 2010   pancreatic adenocarcinoma    OB History    No data available       Home Medications    Prior to Admission medications   Medication Sig Start Date End Date Taking? Authorizing Provider  acetaminophen (TYLENOL) 325 MG tablet Take 2 tablets (650 mg total) by mouth every 6 (six) hours as needed for mild pain (or Fever >/= 101). 03/18/16  Yes Lars Pinks M, PA-C  amoxicillin (AMOXIL) 500 MG capsule Take 2,000 mg by mouth See admin instructions.  For Dental Procedures   Yes [provider]  cholecalciferol (VITAMIN D) 1000 units tablet Take 2,000 Units by mouth daily.   Yes [provider]  CREON 36000 units CPEP capsule TAKE 1 CAPSULE THREE TIMES A DAY WITH MEALS 08/30/16  Yes Kefalas, Manon Hilding, PA-C  furosemide (LASIX) 20 MG tablet Take 20 mg by mouth daily.    Yes [provider]  hydrochlorothiazide (HYDRODIURIL) 25 MG tablet Take 12.5 mg by mouth every morning.    Yes [provider]  loperamide (ANTI-DIARRHEAL) 2 MG capsule Take 2 mg by mouth 3 (three) times daily with  meals. *May take as needed   Yes [provider]  losartan (COZAAR) 100 MG tablet Take 100 mg by mouth every morning.  08/31/15  Yes [provider]  metoprolol succinate (TOPROL-XL) 50 MG 24 hr tablet Take 25 mg by mouth every morning. Take with or immediately following a meal.    Yes [provider]  naproxen sodium (ALEVE) 220 MG tablet Take 220 mg by mouth daily as needed (for pain).    Yes [provider]  omeprazole (PRILOSEC) 20 MG capsule Take 20 mg by mouth daily before breakfast.    Yes [provider]  potassium chloride SA (K-DUR,KLOR-CON) 20 MEQ tablet Take 20 mEq by mouth daily.    Yes [provider]  XARELTO 20 MG TABS tablet Take 1 tablet (20 mg total) by mouth daily with supper. 11/03/16  Yes Holley Bouche, NP  Gemcitabine HCl (GEMZAR IV) Inject into the vein.    [provider]    Family History Family History  Problem Relation Age of Onset  . Pancreatic cancer Brother   . Colon cancer Sister 38       12/2006 dx surgery and chemotherapy  . Ovarian cancer Sister 20       Ovarian ca,  surgery and chemotherapy  . Ovarian cancer Other   . Cancer Other        lung to brain    Social History Social History   Tobacco Use  . Smoking status: Former Smoker    Packs/day: 1.00    Years: 30.00    Pack years: 30.00    Types: Cigarettes    Last attempt to quit: 02/29/1996    Years since quitting: 21.1  . Smokeless tobacco: Never Used  Substance Use Topics  . Alcohol use: No  . Drug use: No     Allergies   Oxaliplatin; Pantoprazole; Adhesive [tape]; Aspirin; Neulasta [pegfilgrastim]; Oxycodone; and Tramadol   Review of Systems Review of Systems  All other systems reviewed and are negative.    Physical Exam Updated Vital Signs BP (!) 154/64   Pulse 68   Temp 98.1 F (36.7 C) (Oral)   Resp 18   Ht 5\' 4"  (1.626 m)   Wt 49 kg (108 lb)   SpO2 96%   BMI 18.54 kg/m   Physical Exam    Constitutional: She is oriented to person, place, and time.  nad  HENT:  Head: Normocephalic and atraumatic.  Eyes: Conjunctivae are normal.  Neck: Neck supple.  Cardiovascular: Normal rate and regular rhythm.  Pulmonary/Chest: Effort normal and breath sounds normal.  Abdominal: Soft. Bowel sounds are normal.  Musculoskeletal: Normal range of motion.  Neurological: She is alert and oriented to person, place, and time.  Skin: Skin is warm and dry.  Psychiatric: She has a normal mood and affect. Her behavior is normal.  Nursing note and  vitals reviewed.    ED Treatments / Results  Labs (all labs ordered are listed, but only abnormal results are displayed) Labs Reviewed  CBC WITH DIFFERENTIAL/PLATELET - Abnormal; Notable for the following components:      Result Value   RBC 2.94 (*)    Hemoglobin 9.4 (*)    HCT 29.7 (*)    MCV 101.0 (*)    All other components within normal limits  BASIC METABOLIC PANEL - Abnormal; Notable for the following components:   CO2 17 (*)    Glucose, Bld 143 (*)    BUN 30 (*)    Creatinine, Ser 1.46 (*)    GFR calc non Af Amer 33 (*)    GFR calc Af Amer 39 (*)    All other components within normal limits  TROPONIN I - Abnormal; Notable for the following components:   Troponin I 0.14 (*)    All other components within normal limits  TROPONIN I  BRAIN NATRIURETIC PEPTIDE  COMPREHENSIVE METABOLIC PANEL  CBC  TROPONIN I    EKG  EKG Interpretation None      Date: 04/11/2017  Rate: 64  Rhythm: normal sinus rhythm  QRS Axis: normal  Intervals: normal  ST/T Wave abnormalities: normal  Conduction Disutrbances: PAC  Narrative Interpretation: unremarkable     Radiology Dg Chest 2 View  Result Date: 04/11/2017 CLINICAL DATA:  Shortness of breath. Metastatic pancreatic cancer. History of COPD. EXAM: CHEST  2 VIEW COMPARISON:  CT chest dated February 01, 2017. Chest x-ray dated Jul 20, 2016. FINDINGS: Unchanged right chest wall port  catheter with the tip in the distal SVC. The heart size and mediastinal contours are within normal limits. Normal pulmonary vascularity. Postsurgical changes with pleuroparenchymal scarring in both lungs are grossly unchanged. The lungs remain hyperinflated with severe emphysematous changes. No pleural effusion or pneumothorax. Sclerotic, expansile, left anterior fifth rib mass appears grossly unchanged. Large ventral abdominal hernia. IMPRESSION: 1. No definite acute cardiopulmonary process. COPD. Chronic postsurgical changes and pleuroparenchymal scarring is grossly unchanged. 2. Large, expansile, left anterior fifth rib metastasis is also grossly unchanged. Electronically Signed   By: Titus Dubin M.D.   On: 04/11/2017 16:31   Ct Angio Chest Pe W And/or Wo Contrast  Result Date: 04/11/2017 CLINICAL DATA:  Shortness of breath. History of pancreatic cancer with metastatic disease to the thorax. EXAM: CT ANGIOGRAPHY CHEST WITH CONTRAST TECHNIQUE: Multidetector CT imaging of the chest was performed using the standard protocol during bolus administration of intravenous contrast. Multiplanar CT image reconstructions and MIPs were obtained to evaluate the vascular anatomy. CONTRAST:  19mL ISOVUE-370 IOPAMIDOL (ISOVUE-370) INJECTION 76% COMPARISON:  Radiograph earlier this day.  Chest CT 02/01/2017 FINDINGS: Cardiovascular: There are no filling defects within the pulmonary arteries to suggest pulmonary embolus. Atherosclerosis of the thoracic aorta without aneurysm or dissection. The heart is normal in size. There are coronary artery calcifications. Small pericardial effusion. Right chest port with tip in the SVC. Mediastinum/Nodes: No pathologically enlarged mediastinal or hilar lymph nodes. Small anterior mediastinal nodes are unchanged from prior exam. There is no axillary adenopathy. Previous right thyroid nodule is not as well-defined on the current exam. The esophagus is decompressed. Lungs/Pleura: Advanced  emphysema. Right upper lobe scarring with surgical staple line. Multiple irregularly-shaped partially cavitary lung masses and pulmonary nodules throughout both lungs are again seen. Partially cavitary irregular posterior right lower lobe mass measures 9.0 x 3.9 cm image 101, increased from a 7.8 x 3.9 cm. Definite increased size and density  of the medial basilar right lower lobe irregular nodular opacity image 112 series 7 currently 2.8 x 2.1 cm, previously 2.6 x 1.6 cm. Peripheral left lower lobe pulmonary nodule central cavitation image 84 measures 1.9 x 1.3 cm, previously 1.4 x 0.9 cm. Increased soft tissue density in the lingula subjacent to left rib lesion. Additional smaller nodules throughout both lungs have also increased in size. Small pleural effusions are not significantly changed. No pneumothorax. The trachea and mainstem bronchi are patent. Upper Abdomen: Chronic pneumobilia. Post Whipple procedure with postsurgical change in the pancreas. Musculoskeletal: Expansile left anterior fifth rib lesion is unchanged measuring 2.7 cm. Sclerotic adjacent left anterior sixth rib lesion is unchanged. Remote left posterior rib fracture. Scoliosis and degenerative change in the spine. No evidence of new osseous abnormality. Review of the MIP images confirms the above findings. IMPRESSION: 1. No pulmonary embolus. 2. Progression of intrathoracic metastatic disease with increased size of pulmonary nodules and masses. 3. Expansile left fifth rib lesion is unchanged. 4. Small pleural effusions are unchanged. 5. Advanced emphysema. Aortic atherosclerosis and coronary artery calcifications. Electronically Signed   By: Jeb Levering M.D.   On: 04/11/2017 21:11    Procedures Procedures (including critical care time)  Medications Ordered in ED Medications  ipratropium-albuterol (DUONEB) 0.5-2.5 (3) MG/3ML nebulizer solution 0.5 mg (not administered)  iopamidol (ISOVUE-370) 76 % injection 100 mL (80 mLs  Intravenous Contrast Given 04/11/17 2030)     Initial Impression / Assessment and Plan / ED Course  I have reviewed the triage vital signs and the nursing notes.  Pertinent labs & imaging results that were available during my care of the patient were reviewed by me and considered in my medical decision making (see chart for details).     Patient with known history of metastatic pancreatic cancer presents with dyspnea.  CT angiogram revealed no pulmonary embolism, but worsening of the intrathoracic metastatic disease.  Troponin elevated at 0.14.  EKG normal.  Will admit for observation.  Final Clinical Impressions(s) / ED Diagnoses   Final diagnoses:  Dyspnea, unspecified type  Elevated troponin  Malignant neoplasm of pancreas, unspecified location of malignancy [facility])    ED Discharge Orders    None       Nat Christen, MD 04/11/17 2353

## 2017-04-12 ENCOUNTER — Encounter (HOSPITAL_COMMUNITY): Payer: Self-pay

## 2017-04-12 ENCOUNTER — Observation Stay (HOSPITAL_BASED_OUTPATIENT_CLINIC_OR_DEPARTMENT_OTHER): Payer: Medicare Other

## 2017-04-12 ENCOUNTER — Other Ambulatory Visit: Payer: Self-pay

## 2017-04-12 DIAGNOSIS — K219 Gastro-esophageal reflux disease without esophagitis: Secondary | ICD-10-CM | POA: Diagnosis not present

## 2017-04-12 DIAGNOSIS — C259 Malignant neoplasm of pancreas, unspecified: Secondary | ICD-10-CM | POA: Diagnosis not present

## 2017-04-12 DIAGNOSIS — C7800 Secondary malignant neoplasm of unspecified lung: Secondary | ICD-10-CM

## 2017-04-12 DIAGNOSIS — D649 Anemia, unspecified: Secondary | ICD-10-CM | POA: Diagnosis not present

## 2017-04-12 DIAGNOSIS — R748 Abnormal levels of other serum enzymes: Secondary | ICD-10-CM

## 2017-04-12 DIAGNOSIS — J439 Emphysema, unspecified: Secondary | ICD-10-CM

## 2017-04-12 DIAGNOSIS — I159 Secondary hypertension, unspecified: Secondary | ICD-10-CM

## 2017-04-12 DIAGNOSIS — R0600 Dyspnea, unspecified: Secondary | ICD-10-CM

## 2017-04-12 DIAGNOSIS — I361 Nonrheumatic tricuspid (valve) insufficiency: Secondary | ICD-10-CM

## 2017-04-12 LAB — IRON AND TIBC
Iron: 56 ug/dL (ref 28–170)
Saturation Ratios: 24 % (ref 10.4–31.8)
TIBC: 234 ug/dL — ABNORMAL LOW (ref 250–450)
UIBC: 178 ug/dL

## 2017-04-12 LAB — TROPONIN I
TROPONIN I: 0.14 ng/mL — AB (ref <0.03)
Troponin I: 0.14 ng/mL (ref <0.03)
Troponin I: 0.16 ng/mL (ref <0.03)
Troponin I: 0.31 ng/mL (ref <0.03)

## 2017-04-12 LAB — BRAIN NATRIURETIC PEPTIDE: B NATRIURETIC PEPTIDE 5: 306 pg/mL — AB (ref 0.0–100.0)

## 2017-04-12 LAB — ECHOCARDIOGRAM COMPLETE
Height: 64 in
Weight: 1728 oz

## 2017-04-12 LAB — COMPREHENSIVE METABOLIC PANEL
ALK PHOS: 119 U/L (ref 38–126)
ALT: 12 U/L — AB (ref 14–54)
AST: 18 U/L (ref 15–41)
Albumin: 2.6 g/dL — ABNORMAL LOW (ref 3.5–5.0)
Anion gap: 9 (ref 5–15)
BUN: 27 mg/dL — AB (ref 6–20)
CALCIUM: 8.9 mg/dL (ref 8.9–10.3)
CHLORIDE: 110 mmol/L (ref 101–111)
CO2: 17 mmol/L — AB (ref 22–32)
CREATININE: 1.36 mg/dL — AB (ref 0.44–1.00)
GFR calc Af Amer: 42 mL/min — ABNORMAL LOW (ref >60)
GFR calc non Af Amer: 36 mL/min — ABNORMAL LOW (ref >60)
GLUCOSE: 107 mg/dL — AB (ref 65–99)
Potassium: 4.5 mmol/L (ref 3.5–5.1)
SODIUM: 136 mmol/L (ref 135–145)
Total Bilirubin: 0.4 mg/dL (ref 0.3–1.2)
Total Protein: 6.2 g/dL — ABNORMAL LOW (ref 6.5–8.1)

## 2017-04-12 LAB — CBC
HCT: 25.6 % — ABNORMAL LOW (ref 36.0–46.0)
Hemoglobin: 8 g/dL — ABNORMAL LOW (ref 12.0–15.0)
MCH: 31.1 pg (ref 26.0–34.0)
MCHC: 31.3 g/dL (ref 30.0–36.0)
MCV: 99.6 fL (ref 78.0–100.0)
PLATELETS: 124 10*3/uL — AB (ref 150–400)
RBC: 2.57 MIL/uL — ABNORMAL LOW (ref 3.87–5.11)
RDW: 14.1 % (ref 11.5–15.5)
WBC: 8.3 10*3/uL (ref 4.0–10.5)

## 2017-04-12 LAB — RETICULOCYTES
RBC.: 2.94 MIL/uL — AB (ref 3.87–5.11)
RETIC COUNT ABSOLUTE: 41.2 10*3/uL (ref 19.0–186.0)
RETIC CT PCT: 1.4 % (ref 0.4–3.1)

## 2017-04-12 LAB — FERRITIN: FERRITIN: 355 ng/mL — AB (ref 11–307)

## 2017-04-12 LAB — VITAMIN B12: VITAMIN B 12: 781 pg/mL (ref 180–914)

## 2017-04-12 LAB — FOLATE: Folate: 23 ng/mL (ref >5.9)

## 2017-04-12 MED ORDER — VITAMIN D 1000 UNITS PO TABS
2000.0000 [IU] | ORAL_TABLET | Freq: Every day | ORAL | Status: DC
  Administered 2017-04-12 – 2017-04-13 (×2): 2000 [IU] via ORAL
  Filled 2017-04-12 (×2): qty 2

## 2017-04-12 MED ORDER — ACETAMINOPHEN 325 MG PO TABS: 650.0000 mg | ORAL_TABLET | Freq: Four times a day (QID) | ORAL | Status: DC | PRN

## 2017-04-12 MED ORDER — SODIUM BICARBONATE 650 MG PO TABS
650.0000 mg | ORAL_TABLET | Freq: Two times a day (BID) | ORAL | Status: DC
  Administered 2017-04-12 (×2): 650 mg via ORAL
  Filled 2017-04-12 (×3): qty 1

## 2017-04-12 MED ORDER — ENSURE ENLIVE PO LIQD
237.0000 mL | Freq: Two times a day (BID) | ORAL | Status: DC
  Administered 2017-04-12: 237 mL via ORAL

## 2017-04-12 MED ORDER — APIXABAN 5 MG PO TABS
5.0000 mg | ORAL_TABLET | Freq: Two times a day (BID) | ORAL | Status: DC
  Administered 2017-04-12 – 2017-04-13 (×3): 5 mg via ORAL
  Filled 2017-04-12 (×3): qty 1

## 2017-04-12 MED ORDER — ORAL CARE MOUTH RINSE: 15.0000 mL | Freq: Two times a day (BID) | OROMUCOSAL | Status: DC

## 2017-04-12 MED ORDER — TIOTROPIUM BROMIDE MONOHYDRATE 18 MCG IN CAPS: 18.0000 ug | ORAL_CAPSULE | Freq: Every day | RESPIRATORY_TRACT | Status: DC

## 2017-04-12 MED ORDER — FUROSEMIDE 20 MG PO TABS
20.0000 mg | ORAL_TABLET | Freq: Every day | ORAL | Status: DC
  Administered 2017-04-12 – 2017-04-13 (×2): 20 mg via ORAL
  Filled 2017-04-12 (×2): qty 1

## 2017-04-12 MED ORDER — LOSARTAN POTASSIUM 50 MG PO TABS
100.0000 mg | ORAL_TABLET | Freq: Every morning | ORAL | Status: DC
  Administered 2017-04-12 – 2017-04-13 (×2): 100 mg via ORAL
  Filled 2017-04-12 (×2): qty 2

## 2017-04-12 MED ORDER — IPRATROPIUM-ALBUTEROL 0.5-2.5 (3) MG/3ML IN SOLN: 0.5000 mg | RESPIRATORY_TRACT | Status: DC | PRN

## 2017-04-12 MED ORDER — FAMOTIDINE 20 MG PO TABS
20.0000 mg | ORAL_TABLET | Freq: Every day | ORAL | Status: DC
  Administered 2017-04-12 – 2017-04-13 (×2): 20 mg via ORAL
  Filled 2017-04-12 (×2): qty 1

## 2017-04-12 MED ORDER — HYDROCHLOROTHIAZIDE 25 MG PO TABS
12.5000 mg | ORAL_TABLET | Freq: Every morning | ORAL | Status: DC
  Administered 2017-04-12 – 2017-04-13 (×2): 12.5 mg via ORAL
  Filled 2017-04-12 (×2): qty 1

## 2017-04-12 MED ORDER — RIVAROXABAN 15 MG PO TABS: 15.0000 mg | ORAL_TABLET | Freq: Every day | ORAL | Status: DC

## 2017-04-12 MED ORDER — PANCRELIPASE (LIP-PROT-AMYL) 12000-38000 UNITS PO CPEP
36000.0000 [IU] | ORAL_CAPSULE | Freq: Three times a day (TID) | ORAL | Status: DC
  Administered 2017-04-12 – 2017-04-13 (×5): 36000 [IU] via ORAL
  Filled 2017-04-12 (×5): qty 3

## 2017-04-12 MED ORDER — IPRATROPIUM-ALBUTEROL 0.5-2.5 (3) MG/3ML IN SOLN
3.0000 mL | Freq: Three times a day (TID) | RESPIRATORY_TRACT | Status: DC
  Administered 2017-04-12 – 2017-04-13 (×2): 3 mL via RESPIRATORY_TRACT
  Filled 2017-04-12: qty 3

## 2017-04-12 MED ORDER — IPRATROPIUM-ALBUTEROL 0.5-2.5 (3) MG/3ML IN SOLN
3.0000 mL | Freq: Three times a day (TID) | RESPIRATORY_TRACT | Status: DC
  Administered 2017-04-12 (×2): 3 mL via RESPIRATORY_TRACT
  Filled 2017-04-12 (×3): qty 3

## 2017-04-12 MED ORDER — LOPERAMIDE HCL 2 MG PO CAPS
2.0000 mg | ORAL_CAPSULE | Freq: Three times a day (TID) | ORAL | Status: DC
  Administered 2017-04-12 – 2017-04-13 (×5): 2 mg via ORAL
  Filled 2017-04-12 (×5): qty 1

## 2017-04-12 MED ORDER — METOPROLOL SUCCINATE ER 25 MG PO TB24
25.0000 mg | ORAL_TABLET | Freq: Every morning | ORAL | Status: DC
  Administered 2017-04-12 – 2017-04-13 (×2): 25 mg via ORAL
  Filled 2017-04-12 (×2): qty 1

## 2017-04-12 MED ORDER — MOMETASONE FURO-FORMOTEROL FUM 200-5 MCG/ACT IN AERO
2.0000 | INHALATION_SPRAY | Freq: Two times a day (BID) | RESPIRATORY_TRACT | Status: DC
  Filled 2017-04-12: qty 8.8

## 2017-04-12 MED ORDER — FAMOTIDINE 20 MG PO TABS: 20.0000 mg | ORAL_TABLET | Freq: Two times a day (BID) | ORAL | Status: DC

## 2017-04-12 MED ORDER — POTASSIUM CHLORIDE CRYS ER 20 MEQ PO TBCR
20.0000 meq | EXTENDED_RELEASE_TABLET | Freq: Every day | ORAL | Status: DC
  Administered 2017-04-12 – 2017-04-13 (×2): 20 meq via ORAL
  Filled 2017-04-12 (×2): qty 1

## 2017-04-12 NOTE — Progress Notes (Addendum)
SATURATION QUALIFICATIONS: (This note is used to comply with regulatory documentation for home oxygen)  Patient Saturations on Room Air at Rest = 96%  Patient Saturations on Room Air while Ambulating = 76%  Patient Saturations on 2 L while Ambulating=93%

## 2017-04-12 NOTE — Care Management Note (Signed)
Case Management Note  Patient Details  Name: [name] [name] MRN: [id number] Date of Birth: 07/06/1940  Subjective/Objective:     Admitted with dyspnea. Pt from home, ind pta. She has PCP, transportation and insurance with drug coverage.               Action/Plan: Will need home O2 as DC. Pt has chosen [facility] from list of DME provider. Blake Divine, [facility] rep, aware of referral and will pull pt info. Port tank will be delivered to pt room today for potential DC home in next 24hrs.   Expected Discharge Date:       04/13/17           Expected Discharge Plan:  Home/Self Care  In-House Referral:  NA  Discharge planning Services  CM Consult  Post Acute Care Choice:  Durable Medical Equipment Choice offered to:  Patient  DME Arranged:  Oxygen DME Agency:  [place].  Status of Service:  Completed, signed off  If discussed at [place] of Stay Meetings, dates discussed:    Additional Comments:  Sherald Barge, RN 04/12/2017, 1:24 PM

## 2017-04-12 NOTE — Progress Notes (Signed)
PROGRESS NOTE    [name] [name]  AUQ:[id number] DOB: 08/06/1940 DOA: 04/11/2017 PCP: Monico Blitz, MD   Brief Narrative:  Patient is a pleasant 77 year old female history of metastatic pancreatic cancer to the lungs with increased tumor growth, COPD, GERD, hypertension, hyperlipidemia presented to the ED with progressive shortness of breath on exertion as well as chest pain.  Patient noted to have a slightly elevated BNP at 306 and mildly elevated troponins.  Cardiology consulted.   Assessment & Plan:   Principal Problem:   Dyspnea Active Problems:   Emphysema lung ([place])   Hypertension   Pancreatic cancer metastasized to lung (HCC)   GERD (gastroesophageal reflux disease)   Anemia   Elevated troponin   #1 dyspnea Questionable etiology.  Could be multifactorial secondary to metastatic pancreatic cancer with metastases to the lungs with increased tumor growth noted on CT chest.  CT chest negative for pulmonary embolism.  Patient also noted to have small pleural effusions and advanced emphysema.  Patient status post recent radiation therapy which could also be contributing to her worsening shortness of breath.  Concern for cardiac etiology.  Troponin is mildly elevated.  2D echo pending.  Patient with sats in the 90s on room air.  On ambulation patient desats into the high 70s and 80s per nursing.  Continue current regimen of Lasix, HCTZ, scheduled nebs, Dulera, Toprol-XL.  Will change patient Xarelto to Eliquis due to renal function.  Check ambulatory O2 sats.  May need home O2.  Cardiology following and appreciate their input and recommendations.  2.  COPD/emphysema Currently stable.  Patient with no wheezing noted on exam.  Patient with advanced COPD per CT chest.  Placed on Dulera, scheduled nebs.  3.  Gastroesophageal reflux disease Continue Pepcid.  4.  Elevated troponin/chest pain Questionable etiology.  Patient with complaints of chest pain on admission.  Troponin is mildly  elevated.  Continue to cycle troponins.  2D echo pending.  Continue cardiac medications of Toprol-XL, Cozaar, Lasix.  Cardiology consulted and are following.  5.  Metastatic pancreatic cancer Patient has recently completed 10 rounds of radiation therapy on 03/29/2017.  Patient awaiting to start immunotherapy in the middle of March with her oncologist per patient.  Continue Creon.  Outpatient follow-up.  6.  History of right transverse sinus, right sigmoid sinus, right internal jugular vein thrombosis Patient was on Xarelto prior to admission for this.  Due to renal function will transition to Eliquis.  Outpatient follow-up with oncology.  7.  Anemia Likely anemia of chronic disease.  Patient with no overt bleeding.  Follow H&H.  8.  Hypertension Stable.  Continue current regimen of losartan, Lasix, metoprolol, HCTZ.  Follow.   DVT prophylaxis: Eliquis Code Status: Full Family Communication: Updated patient.  No family at bedside. Disposition Plan: Likely home  once medically stable with clinical improvement.   Consultants:   Cardiology: Dr. Domenic Polite 04/12/2017  Procedures:   2D echo pending 04/12/2017  CT angiogram chest 04/11/2017  Chest x-ray 04/11/2017  Antimicrobials:   None   Subjective: Shortness of breath at rest.  Patient however states he gets short of breath on ambulation.  Denies any chest pain.  Just finished 10 rounds of radiation March 29, 2017 and getting ready to start immunotherapy.  States was on anticoagulation for sinus thrombosis.  Objective: Vitals:   04/11/17 2300 04/12/17 0111 04/12/17 0650 04/12/17 1105  BP:  (!) 155/96    Pulse: 68 70 68   Resp: 18 16 17    Temp:  97.7 F (36.5 C) 98.2 F (36.8 C)   TempSrc:  Oral Oral   SpO2: 96% 98%  94%  Weight:      Height:       No intake or output data in the 24 hours ending 04/12/17 1207 Filed Weights   04/11/17 1602  Weight: 49 kg (108 lb)    Examination:  General exam: Appears calm and  comfortable  Respiratory system: Clear to auscultation.  No wheezes, no crackles, no rhonchi.  Respiratory effort normal. Cardiovascular system: S1 & S2 heard, RRR. No JVD, murmurs, rubs, gallops or clicks. No pedal edema. Gastrointestinal system: Abdomen is nondistended, soft and nontender. No organomegaly or masses felt. Normal bowel sounds heard. Central nervous system: Alert and oriented. No focal neurological deficits. Extremities: Symmetric 5 x 5 power. Skin: No rashes, lesions or ulcers Psychiatry: Judgement and insight appear normal. Mood & affect appropriate.     Data Reviewed: I have personally reviewed following labs and imaging studies  CBC: Recent Labs  Lab 04/11/17 1608 04/12/17 0411  WBC 8.1 8.3  NEUTROABS 6.1  --   HGB 9.4* 8.0*  HCT 29.7* 25.6*  MCV 101.0* 99.6  PLT 168 272*   Basic Metabolic Panel: Recent Labs  Lab 04/11/17 1608 04/12/17 0411  NA 138 136  K 4.7 4.5  CL 111 110  CO2 17* 17*  GLUCOSE 143* 107*  BUN 30* 27*  CREATININE 1.46* 1.36*  CALCIUM 9.1 8.9   GFR: Estimated Creatinine Clearance: 26.4 mL/min (A) (by C-G formula based on SCr of 1.36 mg/dL (H)). Liver Function Tests: Recent Labs  Lab 04/12/17 0411  AST 18  ALT 12*  ALKPHOS 119  BILITOT 0.4  PROT 6.2*  ALBUMIN 2.6*   No results for input(s): LIPASE, AMYLASE in the last 168 hours. No results for input(s): AMMONIA in the last 168 hours. Coagulation Profile: No results for input(s): INR, PROTIME in the last 168 hours. Cardiac Enzymes: Recent Labs  Lab 04/11/17 2013 04/11/17 2301 04/12/17 0411 04/12/17 0924  TROPONINI 0.14* 0.14* 0.14* 0.16*   BNP (last 3 results) No results for input(s): PROBNP in the last 8760 hours. HbA1C: No results for input(s): HGBA1C in the last 72 hours. CBG: No results for input(s): GLUCAP in the last 168 hours. Lipid Profile: No results for input(s): CHOL, HDL, LDLCALC, TRIG, CHOLHDL, LDLDIRECT in the last 72 hours. Thyroid Function  Tests: No results for input(s): TSH, T4TOTAL, FREET4, T3FREE, THYROIDAB in the last 72 hours. Anemia Panel: Recent Labs    04/12/17 0924  RETICCTPCT 1.4   Sepsis Labs: No results for input(s): PROCALCITON, LATICACIDVEN in the last 168 hours.  No results found for this or any previous visit (from the past 240 hour(s)).       Radiology Studies: Dg Chest 2 View  Result Date: 04/11/2017 CLINICAL DATA:  Shortness of breath. Metastatic pancreatic cancer. History of COPD. EXAM: CHEST  2 VIEW COMPARISON:  CT chest dated February 01, 2017. Chest x-ray dated Jul 20, 2016. FINDINGS: Unchanged right chest wall port catheter with the tip in the distal SVC. The heart size and mediastinal contours are within normal limits. Normal pulmonary vascularity. Postsurgical changes with pleuroparenchymal scarring in both lungs are grossly unchanged. The lungs remain hyperinflated with severe emphysematous changes. No pleural effusion or pneumothorax. Sclerotic, expansile, left anterior fifth rib mass appears grossly unchanged. Large ventral abdominal hernia. IMPRESSION: 1. No definite acute cardiopulmonary process. COPD. Chronic postsurgical changes and pleuroparenchymal scarring is grossly unchanged. 2. Large, expansile, left  anterior fifth rib metastasis is also grossly unchanged. Electronically Signed   By: Titus Dubin M.D.   On: 04/11/2017 16:31   Ct Angio Chest Pe W And/or Wo Contrast  Result Date: 04/11/2017 CLINICAL DATA:  Shortness of breath. History of pancreatic cancer with metastatic disease to the thorax. EXAM: CT ANGIOGRAPHY CHEST WITH CONTRAST TECHNIQUE: Multidetector CT imaging of the chest was performed using the standard protocol during bolus administration of intravenous contrast. Multiplanar CT image reconstructions and MIPs were obtained to evaluate the vascular anatomy. CONTRAST:  77mL ISOVUE-370 IOPAMIDOL (ISOVUE-370) INJECTION 76% COMPARISON:  Radiograph earlier this day.  Chest CT  02/01/2017 FINDINGS: Cardiovascular: There are no filling defects within the pulmonary arteries to suggest pulmonary embolus. Atherosclerosis of the thoracic aorta without aneurysm or dissection. The heart is normal in size. There are coronary artery calcifications. Small pericardial effusion. Right chest port with tip in the SVC. Mediastinum/Nodes: No pathologically enlarged mediastinal or hilar lymph nodes. Small anterior mediastinal nodes are unchanged from prior exam. There is no axillary adenopathy. Previous right thyroid nodule is not as well-defined on the current exam. The esophagus is decompressed. Lungs/Pleura: Advanced emphysema. Right upper lobe scarring with surgical staple line. Multiple irregularly-shaped partially cavitary lung masses and pulmonary nodules throughout both lungs are again seen. Partially cavitary irregular posterior right lower lobe mass measures 9.0 x 3.9 cm image 101, increased from a 7.8 x 3.9 cm. Definite increased size and density of the medial basilar right lower lobe irregular nodular opacity image 112 series 7 currently 2.8 x 2.1 cm, previously 2.6 x 1.6 cm. Peripheral left lower lobe pulmonary nodule central cavitation image 84 measures 1.9 x 1.3 cm, previously 1.4 x 0.9 cm. Increased soft tissue density in the lingula subjacent to left rib lesion. Additional smaller nodules throughout both lungs have also increased in size. Small pleural effusions are not significantly changed. No pneumothorax. The trachea and mainstem bronchi are patent. Upper Abdomen: Chronic pneumobilia. Post Whipple procedure with postsurgical change in the pancreas. Musculoskeletal: Expansile left anterior fifth rib lesion is unchanged measuring 2.7 cm. Sclerotic adjacent left anterior sixth rib lesion is unchanged. Remote left posterior rib fracture. Scoliosis and degenerative change in the spine. No evidence of new osseous abnormality. Review of the MIP images confirms the above findings. IMPRESSION:  1. No pulmonary embolus. 2. Progression of intrathoracic metastatic disease with increased size of pulmonary nodules and masses. 3. Expansile left fifth rib lesion is unchanged. 4. Small pleural effusions are unchanged. 5. Advanced emphysema. Aortic atherosclerosis and coronary artery calcifications. Electronically Signed   By: Jeb Levering M.D.   On: 04/11/2017 21:11        Scheduled Meds: . apixaban  5 mg Oral BID  . cholecalciferol  2,000 Units Oral Daily  . famotidine  20 mg Oral Daily  . feeding supplement (ENSURE ENLIVE)  237 mL Oral BID BM  . furosemide  20 mg Oral Daily  . hydrochlorothiazide  12.5 mg Oral q morning - 10a  . ipratropium-albuterol  3 mL Nebulization TID  . lipase/protease/amylase  36,000 Units Oral TID AC  . loperamide  2 mg Oral TID WC  . losartan  100 mg Oral q morning - 10a  . mouth rinse  15 mL Mouth Rinse q12n4p  . metoprolol succinate  25 mg Oral q morning - 10a  . mometasone-formoterol  2 puff Inhalation BID  . potassium chloride SA  20 mEq Oral Daily  . sodium bicarbonate  650 mg Oral BID   Continuous  Infusions:   LOS: 0 days    Time spent: 35 mins    Irine Seal, MD [facility] Pager [phone]  If 7PM-7AM, please contact night-coverage [email or web address] Password [facility] 04/12/2017, 12:07 PM

## 2017-04-12 NOTE — Progress Notes (Signed)
CRITICAL VALUE ALERT  Critical Value: Troponin 0.16  Date & Time Notied:  04/12/17  Provider Notified: Grandville Silos   Orders Received/Actions taken:

## 2017-04-12 NOTE — Progress Notes (Addendum)
ANTICOAGULATION CONSULT NOTE - Initial Consult  Pharmacy Consult for Eliquis Indication: pulmonary embolus  Allergies  Allergen Reactions  . Oxaliplatin Anaphylaxis  . Pantoprazole Other (See Comments)    THIS GIVES THE PATIENT TERRIBLE HEARTBURN (PLEASE DO NOT GIVE)  . Adhesive [Tape] Rash  . Aspirin Other (See Comments)    Burns stomach  . Neulasta [Pegfilgrastim] Other (See Comments)    UNSURE   . Oxycodone Nausea And Vomiting  . Tramadol Nausea And Vomiting    Patient Measurements: Height: 5\' 4"  (162.6 cm) Weight: 108 lb (49 kg) IBW/kg (Calculated) : 54.7  Vital Signs: Temp: 98.2 F (36.8 C) (02/20 0650) Temp Source: Oral (02/20 0650) BP: 155/96 (02/20 0111) Pulse Rate: 68 (02/20 0650)  Labs: Recent Labs    04/11/17 1608 04/11/17 2013 04/11/17 2301 04/12/17 0411  HGB 9.4*  --   --  8.0*  HCT 29.7*  --   --  25.6*  PLT 168  --   --  124*  CREATININE 1.46*  --   --  1.36*  TROPONINI  --  0.14* 0.14* 0.14*    Estimated Creatinine Clearance: 26.4 mL/min (A) (by C-G formula based on SCr of 1.36 mg/dL (H)).   Medical History: Past Medical History:  Diagnosis Date  . Abdominal wall hernia   . Anemia   . Anemia due to antineoplastic chemotherapy 07/05/2016  . Aortic aneurysm (HCC)    small aortic aneurysm  . Arthritis    "little in my right hand" (03/10/2016)  . Cervical cancer ([place]) 1963  . COPD (chronic obstructive pulmonary disease) ([place])   . Emphysema   . GERD (gastroesophageal reflux disease)   . Goals of care, counseling/discussion 02/02/2016  . Hypercholesterolemia   . Hypertension   . Low serum vitamin B12 01/20/2016  . Pancreatic adenocarcinoma [facility]) July 2010  . Pancreatic cancer metastasized to lung ([place]) 09/04/2015    Medications:  Medications Prior to Admission  Medication Sig Dispense Refill Last Dose  . acetaminophen (TYLENOL) 325 MG tablet Take 2 tablets (650 mg total) by mouth every 6 (six) hours as needed for mild pain (or Fever >/=  101).   04/10/2017 at Unknown time  . amoxicillin (AMOXIL) 500 MG capsule Take 2,000 mg by mouth See admin instructions. For Dental Procedures   unknown  . cholecalciferol (VITAMIN D) 1000 units tablet Take 2,000 Units by mouth daily.   04/11/2017 at Unknown time  . CREON 36000 units CPEP capsule TAKE 1 CAPSULE THREE TIMES A DAY WITH MEALS 270 capsule 1 04/11/2017 at Unknown time  . furosemide (LASIX) 20 MG tablet Take 20 mg by mouth daily.    04/11/2017 at Unknown time  . hydrochlorothiazide (HYDRODIURIL) 25 MG tablet Take 12.5 mg by mouth every morning.    04/11/2017 at Unknown time  . loperamide (ANTI-DIARRHEAL) 2 MG capsule Take 2 mg by mouth 3 (three) times daily with meals. *May take as needed   04/11/2017 at Unknown time  . losartan (COZAAR) 100 MG tablet Take 100 mg by mouth every morning.    04/11/2017 at Unknown time  . metoprolol succinate (TOPROL-XL) 50 MG 24 hr tablet Take 25 mg by mouth every morning. Take with or immediately following a meal.    04/11/2017 at 830a  . naproxen sodium (ALEVE) 220 MG tablet Take 220 mg by mouth daily as needed (for pain).    unknown  . omeprazole (PRILOSEC) 20 MG capsule Take 20 mg by mouth daily before breakfast.    04/11/2017 at Unknown  time  . potassium chloride SA (K-DUR,KLOR-CON) 20 MEQ tablet Take 20 mEq by mouth daily.    04/11/2017 at Unknown time  . XARELTO 20 MG TABS tablet Take 1 tablet (20 mg total) by mouth daily with supper. 90 tablet 1 04/10/2017 at 2100  . Gemcitabine HCl (GEMZAR IV) Inject into the vein.   HOLD    Assessment: 77 yo female  admitted for SOB. history significant of abdominal wall hernia, anemia, aortic aneurysm, osteoarthritis, history of cervical cancer, history of lung cancer, COPD/emphysema, GERD, hyperlipidemia, hypertension, history of low B12 level, pancreatic carcinoma metastasized to the lung. Patient has been on xarelto for PE. Last dose of xarelto on 04/10/17. Her CrCl is below 70mls/min and not recommended to use. D/W  hospitalist and will transition to eliquis.  Goal of Therapy:  Monitor platelets by anticoagulation protocol: Yes   Plan:  Eliquis 5mg  po BID CBC MWF Monitor for S/S of bleeding Educate on eliquis  Isac Sarna, BS Vena [place], BCPS Clinical Pharmacist Pager [phone] 04/12/2017,8:59 AM

## 2017-04-12 NOTE — Progress Notes (Signed)
*  PRELIMINARY RESULTS* Echocardiogram 2D Echocardiogram has been performed.  Leavy Cella 04/12/2017, 11:15 AM

## 2017-04-12 NOTE — Consult Note (Signed)
Cardiology Consultation:   Patient ID: BAYLEN DEA; [id number]; 1940/07/17   Admit date: 04/11/2017 Date of Consult: 04/12/2017  Primary Care Provider: Monico Blitz, MD Primary Cardiologist:  New Dr. Domenic Polite Primary Electrophysiologist:  NA   Patient Profile:   [name] [name] is a 77 y.o. female with a hx of  Metastatic pancreatic cancer who is being seen today for the evaluation of chest pain and shortness of breath at the request of Dr.Oritz.  History of Present Illness:   Ms. Belknap with history of metastatic pancreatic CA to lungs with increased tumor growth, COPD, GERD, HTN, HLD, aortic aneurysm(4.0 inrarenal CT 01/2017) admitted with progressive dyspnea as well as exertion chest pressure. No prior cardiac history.BNP 306 troponins 0.14, 0.14, 0.16. Hgb 8.0, Crt 1.36 low albumin and protein.   Patient says she has had chest tightness and dyspnea on exertion since last Jan when she had 2 spontaneous pneumothorx treated with polmonary resextion and talc pleurodesis and finally pigtail catheter placement. Finished chemo in Nov 2018 and just completed 10 rounds of radiation 03/29/17. Getting ready to start immunotherapy. Complains of legs swelling for 4 months on "4 fluid pills daily" Worsening dyspnea brought her in yest.  Past Medical History:  Diagnosis Date  . Abdominal wall hernia   . Anemia   . Anemia due to antineoplastic chemotherapy 07/05/2016  . Aortic aneurysm (HCC)    small aortic aneurysm  . Arthritis    "little in my right hand" (03/10/2016)  . Cervical cancer ([place]) 1963  . COPD (chronic obstructive pulmonary disease) ([place])   . Emphysema   . GERD (gastroesophageal reflux disease)   . Goals of care, counseling/discussion 02/02/2016  . Hypercholesterolemia   . Hypertension   . Low serum vitamin B12 01/20/2016  . Pancreatic adenocarcinoma [facility]) July 2010  . Pancreatic cancer metastasized to lung [facility]) 09/04/2015    Past Surgical History:  Procedure Laterality  Date  . BREAST CYST EXCISION Right 1988  . BUNIONECTOMY Bilateral 2007  . CARDIAC CATHETERIZATION    . CATARACT EXTRACTION W/ INTRAOCULAR LENS  IMPLANT, BILATERAL Bilateral 2009  . CHOLECYSTECTOMY  July 2010   done w/ Whipple procedure  . IR GENERIC HISTORICAL  03/15/2016   IR GUIDED DRAIN W CATHETER PLACEMENT 03/15/2016 Greggory Keen, MD [facility]-INTERV RAD  . SKIN CANCER EXCISION Left    nasal fold  . STAPLING OF BLEBS Left 04/20/2015   Procedure: STAPLING OF BLEBS;  Surgeon: Ivin Poot, MD;  Location: [place];  Service: Thoracic;  Laterality: Left;  . THYROID SURGERY     "had a growth taken off"  . TONSILLECTOMY  1943  . VAGINAL HYSTERECTOMY  1963   Cervical Cancer  . VIDEO ASSISTED THORACOSCOPY Left 04/20/2015   Procedure: VIDEO ASSISTED THORACOSCOPY;  Surgeon: Ivin Poot, MD;  Location: [place];  Service: Thoracic;  Laterality: Left;  . WHIPPLE PROCEDURE  July 2010   pancreatic adenocarcinoma     Home Medications:  Prior to Admission medications   Medication Sig Start Date End Date Taking? Authorizing Provider  acetaminophen (TYLENOL) 325 MG tablet Take 2 tablets (650 mg total) by mouth every 6 (six) hours as needed for mild pain (or Fever >/= 101). 03/18/16  Yes Lars Pinks M, PA-C  amoxicillin (AMOXIL) 500 MG capsule Take 2,000 mg by mouth See admin instructions. For Dental Procedures   Yes [provider]  cholecalciferol (VITAMIN D) 1000 units tablet Take 2,000 Units by mouth daily.   Yes [provider]  CREON  36000 units CPEP capsule TAKE 1 CAPSULE THREE TIMES A DAY WITH MEALS 08/30/16  Yes Kefalas, Manon Hilding, PA-C  furosemide (LASIX) 20 MG tablet Take 20 mg by mouth daily.    Yes [provider]  hydrochlorothiazide (HYDRODIURIL) 25 MG tablet Take 12.5 mg by mouth every morning.    Yes [provider]  loperamide (ANTI-DIARRHEAL) 2 MG capsule Take 2 mg by mouth 3 (three) times daily with meals. *May take as needed   Yes [provider]  losartan (COZAAR) 100 MG tablet Take 100 mg by mouth every morning.  08/31/15  Yes [provider]  metoprolol succinate (TOPROL-XL) 50 MG 24 hr tablet Take 25 mg by mouth every morning. Take with or immediately following a meal.    Yes [provider]  naproxen sodium (ALEVE) 220 MG tablet Take 220 mg by mouth daily as needed (for pain).    Yes [provider]  omeprazole (PRILOSEC) 20 MG capsule Take 20 mg by mouth daily before breakfast.    Yes [provider]  potassium chloride SA (K-DUR,KLOR-CON) 20 MEQ tablet Take 20 mEq by mouth daily.    Yes [provider]  XARELTO 20 MG TABS tablet Take 1 tablet (20 mg total) by mouth daily with supper. 11/03/16  Yes Holley Bouche, NP  Gemcitabine HCl (GEMZAR IV) Inject into the vein.    [provider]    Inpatient Medications: Scheduled Meds: . apixaban  5 mg Oral BID  . cholecalciferol  2,000 Units Oral Daily  . famotidine  20 mg Oral Daily  . feeding supplement (ENSURE ENLIVE)  237 mL Oral BID BM  . furosemide  20 mg Oral Daily  . hydrochlorothiazide  12.5 mg Oral q morning - 10a  . ipratropium-albuterol  3 mL Nebulization TID  . lipase/protease/amylase  36,000 Units Oral TID AC  . loperamide  2 mg Oral TID WC  . losartan  100 mg Oral q morning - 10a  . mouth rinse  15 mL Mouth Rinse q12n4p  . metoprolol succinate  25 mg Oral q morning - 10a  . mometasone-formoterol  2 puff Inhalation BID  . potassium chloride SA  20 mEq Oral Daily  . sodium bicarbonate  650 mg Oral BID   Continuous Infusions:  PRN Meds: acetaminophen, ipratropium-albuterol  Allergies:    Allergies  Allergen Reactions  . Oxaliplatin Anaphylaxis  . Pantoprazole Other (See Comments)    THIS GIVES THE PATIENT TERRIBLE HEARTBURN (PLEASE DO NOT GIVE)  . Adhesive [Tape] Rash  . Aspirin Other (See Comments)    Burns stomach  . Neulasta [Pegfilgrastim] Other (See Comments)    UNSURE   .  Oxycodone Nausea And Vomiting  . Tramadol Nausea And Vomiting    Social History:   Social History   Socioeconomic History  . Marital status: Widowed    Spouse name: Not on file  . Number of children: 0  . Years of education: Not on file  . Highest education level: Not on file  Social Needs  . Financial resource strain: Not on file  . Food insecurity - worry: Not on file  . Food insecurity - inability: Not on file  . Transportation needs - medical: Not on file  . Transportation needs - non-medical: Not on file  Occupational History    Employer: RETIRED  Tobacco Use  . Smoking status: Former Smoker    Packs/day: 1.00    Years: 30.00    Pack years: 30.00  Types: Cigarettes    Last attempt to quit: 02/29/1996    Years since quitting: 21.1  . Smokeless tobacco: Never Used  Substance and Sexual Activity  . Alcohol use: No  . Drug use: No  . Sexual activity: No  Other Topics Concern  . Not on file  Social History Narrative  . Not on file    Family History:    Family History  Problem Relation Age of Onset  . Pancreatic cancer Brother   . Colon cancer Sister 67       12/2006 dx surgery and chemotherapy  . Ovarian cancer Sister 45       Ovarian ca,  surgery and chemotherapy  . Ovarian cancer Other   . Cancer Other        lung to brain  . Heart disease Mother      ROS:  Please see the history of present illness.   Review of Systems  Constitution: Positive for decreased appetite, weakness, malaise/fatigue and weight loss.  HENT: Negative.   Eyes: Negative.   Cardiovascular: Positive for chest pain, dyspnea on exertion and leg swelling.  Respiratory: Positive for shortness of breath.   Hematologic/Lymphatic: Negative.   Musculoskeletal: Negative.  Negative for joint pain.  Gastrointestinal: Negative.   Genitourinary: Negative.     All other ROS reviewed and negative.     Physical Exam/Data:   Vitals:   04/11/17 2245 04/11/17 2300 04/12/17 0111 04/12/17 0650   BP:   (!) 155/96   Pulse: 67 68 70 68  Resp: (!) 24 18 16 17   Temp:   97.7 F (36.5 C) 98.2 F (36.8 C)  TempSrc:   Oral Oral  SpO2: 98% 96% 98%   Weight:      Height:       No intake or output data in the 24 hours ending 04/12/17 1026 Filed Weights   04/11/17 1602  Weight: 108 lb (49 kg)   Body mass index is 18.54 kg/m.  General: Thin, elderly, in no acute distress HEENT: normal Lymph: no adenopathy Neck: no JVD Endocrine:  No thryomegaly Vascular: No carotid bruits; FA pulses 2+ bilaterally without bruits  Cardiac:  normal S1, S2; RRR;1/6 sys murmur LSB Lungs:  Decreased BS without rales Abd: soft, nontender, no hepatomegaly  Ext: trace edema Musculoskeletal:  No deformities, BUE and BLE strength normal and equal Skin: warm and dry  Neuro:  CNs 2-12 intact, no focal abnormalities noted Psych:  Normal affect   EKG:  The EKG was personally reviewed and demonstrates:   EKG from yest not in computer. EKG 03/07/16 NSR Telemetry:  Telemetry was personally reviewed and demonstrates:  NSR with PAC's PVC's  Relevant CV Studies:  CT 01/2017 IMPRESSION: 1. Pulmonary metastases appear mildly increased. 2. Expansile anterior left fifth rib metastasis appears mildly increased. 3. 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 No noncontrast CT evidence of progressive metastatic disease in the abdomen or pelvis . 4. Evidence of fluid third-spacing, including new trace dependent bilateral pleural effusions, increased small pericardial effusion/thickening and new small volume ascites. 5. Morphologic changes suggesting cirrhosis . 6. Chronic findings include: Infrarenal 4.0 cm abdominal aortic aneurysm, stable. Aortic Atherosclerosis (ICD10-I70.0) and Emphysema (ICD10-J43.9). Coronary atherosclerosis .     Electronically Signed   By: Ilona Sorrel M.D.   On: 02/02/2017 07:34  Laboratory  Data:  Chemistry Recent Labs  Lab 04/11/17 1608 04/12/17 0411  NA 138 136  K 4.7  4.5  CL 111 110  CO2 17* 17*  GLUCOSE 143* 107*  BUN 30* 27*  CREATININE 1.46* 1.36*  CALCIUM 9.1 8.9  GFRNONAA 33* 36*  GFRAA 39* 42*  ANIONGAP 10 9    Recent Labs  Lab 04/12/17 0411  PROT 6.2*  ALBUMIN 2.6*  AST 18  ALT 12*  ALKPHOS 119  BILITOT 0.4   Hematology Recent Labs  Lab 04/11/17 1608 04/12/17 0411 04/12/17 0924  WBC 8.1 8.3  --   RBC 2.94* 2.57* 2.94*  HGB 9.4* 8.0*  --   HCT 29.7* 25.6*  --   MCV 101.0* 99.6  --   MCH 32.0 31.1  --   MCHC 31.6 31.3  --   RDW 15.1 14.1  --   PLT 168 124*  --    Cardiac Enzymes Recent Labs  Lab 04/11/17 2013 04/11/17 2301 04/12/17 0411 04/12/17 0924  TROPONINI 0.14* 0.14* 0.14* 0.16*   No results for input(s): TROPIPOC in the last 168 hours.  BNP Recent Labs  Lab 04/11/17 2301  BNP 306.0*    DDimer No results for input(s): DDIMER in the last 168 hours.  Radiology/Studies:  Dg Chest 2 View  Result Date: 04/11/2017 CLINICAL DATA:  Shortness of breath. Metastatic pancreatic cancer. History of COPD. EXAM: CHEST  2 VIEW COMPARISON:  CT chest dated February 01, 2017. Chest x-ray dated Jul 20, 2016. FINDINGS: Unchanged right chest wall port catheter with the tip in the distal SVC. The heart size and mediastinal contours are within normal limits. Normal pulmonary vascularity. Postsurgical changes with pleuroparenchymal scarring in both lungs are grossly unchanged. The lungs remain hyperinflated with severe emphysematous changes. No pleural effusion or pneumothorax. Sclerotic, expansile, left anterior fifth rib mass appears grossly unchanged. Large ventral abdominal hernia. IMPRESSION: 1. No definite acute cardiopulmonary process. COPD. Chronic postsurgical changes and pleuroparenchymal scarring is grossly unchanged. 2. Large, expansile, left anterior fifth rib metastasis is also grossly unchanged. Electronically Signed   By: Titus Dubin M.D.   On: 04/11/2017 16:31   Ct Angio Chest Pe W And/or Wo Contrast  Result Date: 04/11/2017 CLINICAL DATA:  Shortness of breath. History of pancreatic cancer with metastatic disease to the thorax. EXAM: CT ANGIOGRAPHY CHEST WITH CONTRAST TECHNIQUE: Multidetector CT imaging of the chest was performed using the standard protocol during bolus administration of intravenous contrast. Multiplanar CT image reconstructions and MIPs were obtained to evaluate the vascular anatomy. CONTRAST:  40mL ISOVUE-370 IOPAMIDOL (ISOVUE-370) INJECTION 76% COMPARISON:  Radiograph earlier this day.  Chest CT 02/01/2017 FINDINGS: Cardiovascular: There are no filling defects within the pulmonary arteries to suggest pulmonary embolus. Atherosclerosis of the thoracic aorta without aneurysm or dissection. The heart is normal in size. There are coronary artery calcifications. Small pericardial effusion. Right chest port with tip in the SVC. Mediastinum/Nodes: No pathologically enlarged mediastinal or hilar lymph nodes. Small anterior mediastinal nodes are unchanged from prior exam. There is no axillary adenopathy. Previous right thyroid nodule is not as well-defined on the current exam. The esophagus is decompressed. Lungs/Pleura: Advanced emphysema. Right upper lobe scarring with surgical staple line. Multiple irregularly-shaped partially cavitary lung masses and pulmonary nodules throughout both lungs are again seen. Partially cavitary irregular posterior right lower lobe mass measures 9.0 x 3.9 cm image 101, increased from a 7.8 x 3.9 cm. Definite increased size and density of the medial basilar right lower lobe irregular nodular opacity image 112 series 7 currently 2.8 x 2.1 cm, previously 2.6 x 1.6 cm. Peripheral left lower lobe  pulmonary nodule central cavitation image 84 measures 1.9 x 1.3 cm, previously 1.4 x 0.9 cm. Increased soft tissue density in the lingula subjacent to left rib lesion. Additional smaller nodules  throughout both lungs have also increased in size. Small pleural effusions are not significantly changed. No pneumothorax. The trachea and mainstem bronchi are patent. Upper Abdomen: Chronic pneumobilia. Post Whipple procedure with postsurgical change in the pancreas. Musculoskeletal: Expansile left anterior fifth rib lesion is unchanged measuring 2.7 cm. Sclerotic adjacent left anterior sixth rib lesion is unchanged. Remote left posterior rib fracture. Scoliosis and degenerative change in the spine. No evidence of new osseous abnormality. Review of the MIP images confirms the above findings. IMPRESSION: 1. No pulmonary embolus. 2. Progression of intrathoracic metastatic disease with increased size of pulmonary nodules and masses. 3. Expansile left fifth rib lesion is unchanged. 4. Small pleural effusions are unchanged. 5. Advanced emphysema. Aortic atherosclerosis and coronary artery calcifications. Electronically Signed   By: Jeb Levering M.D.   On: 04/11/2017 21:11    Assessment and Plan:   1. Dyspnea with BNP 306. Recent leg edema.await echo results on hydrodiuril/lasix/cozaar/toprol/eliquis 2. Exertional chest pressure troponins elevated but flat could be related to metastatic lung CA 3. Metastatic pancreatic CA to lungs with increased tumor 4. Advanced emphysema 5. AAA 4.0 infrarenal on CT 01/2017 6. HTN on good meds. BP up a little today. Await echo 7. HLD   For questions or updates, please contact [place] Please consult [email or web address] for contact info under Cardiology/STEMI.   Sumner Boast, PA-C  04/12/2017 10:26 AM   Attending note:  Patient seen and examined.  Reviewed records and discussed the case with Ms. Bonnell Public PA-C.  Ms. Toney Reil presents with dyspnea on exertion in the setting of metastatic pancreatic cancer with involvement of the lungs, radiation treatments related to that diagnosis, and reportedly advanced emphysema.  She follows with an oncologist in [place], [place].  History also includes prior pneumothoraces with talc pleurodesis, chemotherapy, no diagnosis of cardiomyopathy.  She tells me that she has checked her oxygen saturation periodically at home with measurements as low as "83."  On examination she is in no distress.  Heart rate is in the 80s-90s in sinus rhythm by telemetry which I personally reviewed.  Systolic blood pressure 681L. Lungs exhibit coarse breath sounds, diminished at the bases without wheezing.  Lab work shows potassium 4.5, BUN 27, creatinine 1.36, troponin I levels 0.14-0.16 and flat pattern.  BNP 306, hemoglobin 8.0, platelets 124.  Chest CTA showed no pulmonary embolus but evidence of progressive metastatic disease within the lungs, small pleural effusions.  Also incidentally noted coronary artery calcifications.  I personally reviewed her ECG which shows sinus rhythm with leftward axis.  Shortness of breath, likely multifactorial in light of patient's complex comorbidities.  Agree with obtaining echocardiogram to assess cardiac structure and function, although overall would recommend conservative management and medical therapy from a cardiac perspective (no ischemic work-up).  Would also check ambulatory oxygen saturation as she might benefit from supplemental oxygen at home.  Note that she is on Xarelto at home, uncertain indication but possibly prior pulmonary embolus.  Would also recommend continuing standing diuretic therapy.  Satira Sark, M.D., F.A.C.C.

## 2017-04-12 NOTE — Care Management Obs Status (Signed)
[place] NOTIFICATION   Patient Details  Name: [name] [name] MRN: [id number] Date of Birth: 07-11-1940   Medicare Observation Status Notification Given:  Yes    Sherald Barge, RN 04/12/2017, 9:34 AM

## 2017-04-12 NOTE — Discharge Instructions (Signed)
Information on my medicine - ELIQUIS (apixaban)  This medication education was reviewed with me or my healthcare representative as part of my discharge preparation.  The pharmacist that spoke with me during my hospital stay was:  Pricilla Larsson, [facility]  Why was Eliquis prescribed for you? Eliquis was prescribed for you to reduce the risk of forming blood clots that can cause a stroke if you have a medical condition called atrial fibrillation (a type of irregular heartbeat) OR to reduce the risk of a blood clots forming after orthopedic surgery.  What do You need to know about Eliquis ? Take your Eliquis TWICE DAILY - one tablet in the morning and one tablet in the evening with or without food.  It would be best to take the doses about the same time each day.  If you have difficulty swallowing the tablet whole please discuss with your pharmacist how to take the medication safely.  Take Eliquis exactly as prescribed by your doctor and DO NOT stop taking Eliquis without talking to the doctor who prescribed the medication.  Stopping may increase your risk of developing a new clot or stroke.  Refill your prescription before you run out.  After discharge, you should have regular check-up appointments with your healthcare provider that is prescribing your Eliquis.  In the future your dose may need to be changed if your kidney function or weight changes by a significant amount or as you get older.  What do you do if you miss a dose? If you miss a dose, take it as soon as you remember on the same day and resume taking twice daily.  Do not take more than one dose of ELIQUIS at the same time.  Important Safety Information A possible side effect of Eliquis is bleeding. You should call your healthcare provider right away if you experience any of the following: ? Bleeding from an injury or your nose that does not stop. ? Unusual colored urine (red or dark brown) or unusual colored stools (red or  black). ? Unusual bruising for unknown reasons. ? A serious fall or if you hit your head (even if there is no bleeding).  Some medicines may interact with Eliquis and might increase your risk of bleeding or clotting while on Eliquis. To help avoid this, consult your healthcare provider or pharmacist prior to using any new prescription or non-prescription medications, including herbals, vitamins, non-steroidal anti-inflammatory drugs (NSAIDs) and supplements.  This website has more information on Eliquis (apixaban): [email or web address].

## 2017-04-12 NOTE — ED Notes (Signed)
Date and time results received: 04/12/17 0030 (use smartphrase ".now" to insert current time)  Test: troponin Critical Value: 0.14  Name of Provider Notified: Olevia Bowens  Orders Received? Or Actions Taken?:

## 2017-04-13 DIAGNOSIS — R748 Abnormal levels of other serum enzymes: Secondary | ICD-10-CM | POA: Diagnosis not present

## 2017-04-13 DIAGNOSIS — I340 Nonrheumatic mitral (valve) insufficiency: Secondary | ICD-10-CM | POA: Diagnosis not present

## 2017-04-13 DIAGNOSIS — K219 Gastro-esophageal reflux disease without esophagitis: Secondary | ICD-10-CM | POA: Diagnosis not present

## 2017-04-13 DIAGNOSIS — C7800 Secondary malignant neoplasm of unspecified lung: Secondary | ICD-10-CM | POA: Diagnosis not present

## 2017-04-13 DIAGNOSIS — J439 Emphysema, unspecified: Secondary | ICD-10-CM | POA: Diagnosis not present

## 2017-04-13 DIAGNOSIS — I10 Essential (primary) hypertension: Secondary | ICD-10-CM | POA: Diagnosis not present

## 2017-04-13 DIAGNOSIS — C259 Malignant neoplasm of pancreas, unspecified: Secondary | ICD-10-CM | POA: Diagnosis not present

## 2017-04-13 DIAGNOSIS — R0600 Dyspnea, unspecified: Secondary | ICD-10-CM | POA: Diagnosis not present

## 2017-04-13 LAB — CBC
HEMATOCRIT: 26 % — AB (ref 36.0–46.0)
Hemoglobin: 8.5 g/dL — ABNORMAL LOW (ref 12.0–15.0)
MCH: 32.3 pg (ref 26.0–34.0)
MCHC: 32.7 g/dL (ref 30.0–36.0)
MCV: 98.9 fL (ref 78.0–100.0)
PLATELETS: 151 10*3/uL (ref 150–400)
RBC: 2.63 MIL/uL — ABNORMAL LOW (ref 3.87–5.11)
RDW: 14.8 % (ref 11.5–15.5)
WBC: 10.4 10*3/uL (ref 4.0–10.5)

## 2017-04-13 LAB — BASIC METABOLIC PANEL
ANION GAP: 12 (ref 5–15)
BUN: 28 mg/dL — AB (ref 6–20)
CO2: 18 mmol/L — AB (ref 22–32)
Calcium: 8.7 mg/dL — ABNORMAL LOW (ref 8.9–10.3)
Chloride: 108 mmol/L (ref 101–111)
Creatinine, Ser: 1.29 mg/dL — ABNORMAL HIGH (ref 0.44–1.00)
GFR calc Af Amer: 45 mL/min — ABNORMAL LOW (ref >60)
GFR calc non Af Amer: 39 mL/min — ABNORMAL LOW (ref >60)
GLUCOSE: 95 mg/dL (ref 65–99)
Potassium: 4.3 mmol/L (ref 3.5–5.1)
Sodium: 138 mmol/L (ref 135–145)

## 2017-04-13 MED ORDER — BUDESONIDE-FORMOTEROL FUMARATE 80-4.5 MCG/ACT IN AERO: 2.0000 | INHALATION_SPRAY | Freq: Two times a day (BID) | RESPIRATORY_TRACT | 0 refills | Status: AC

## 2017-04-13 MED ORDER — APIXABAN 5 MG PO TABS: 5.0000 mg | ORAL_TABLET | Freq: Two times a day (BID) | ORAL | 0 refills | Status: AC

## 2017-04-13 MED ORDER — TIOTROPIUM BROMIDE MONOHYDRATE 18 MCG IN CAPS: 18.0000 ug | ORAL_CAPSULE | Freq: Every day | RESPIRATORY_TRACT | 2 refills | Status: AC

## 2017-04-13 NOTE — Discharge Summary (Signed)
Physician Discharge Summary  TAMKIA TEMPLES TKW:[id number] DOB: 1940/12/22 DOA: 04/11/2017  PCP: Monico Blitz, MD  Admit date: 04/11/2017 Discharge date: 04/13/2017  Time spent: 60 minutes  Recommendations for Outpatient Follow-up:  1. Follow-up with Monico Blitz, MD in 1-2 weeks.  On follow-up patient's O2 sats would need to be reassessed as patient has been discharged home on home O2. 2. Follow-up with oncology as scheduled.   Discharge Diagnoses:  Principal Problem:   Dyspnea Active Problems:   Emphysema lung (HCC)   Hypertension   Pancreatic cancer metastasized to lung [facility])   GERD (gastroesophageal reflux disease)   Anemia   Elevated troponin   Discharge Condition: Stable and improved  Diet recommendation: Heart healthy  Filed Weights   04/11/17 1602 04/13/17 0602  Weight: 49 kg (108 lb) 51 kg (112 lb 7 oz)    History of present illness:  Per Dr Telford Nab is a 77 y.o. female with medical history significant of abdominal wall hernia, anemia, aortic aneurysm, osteoarthritis, history of cervical cancer, history of lung cancer, COPD/emphysema, GERD, hyperlipidemia, hypertension, history of low B12 level, pancreatic carcinoma metastasized to the lung who was coming to the emergency department with complaints of progressively worse dyspnea for the past 3-4 days, which worsened when she is exerting.  She mentioned that at times she has a non-radiated pressure-like sensation in her chest.  She denies fever, chills, productive cough, wheezing, hemoptysis, nausea, emesis, constipation, melena or hematochezia.  She gets frequent diarrhea which is a chronic symptom.  Denied dysuria, frequency or hematuria.  No heat or cold intolerance.  No polyuria, polydipsia or blurred vision.   ED Course: Initial vital signs in the emergency department temperature 98.62F, pulse 71, respirations 19, blood pressure 156/86 mmHg and O2 sat 98% on room air.  Her white count was 8.1, with  a normal differential, hemoglobin 9.4 g/dL and platelets 168.  Sodium is 138, potassium 4.7, chloride 111 and CO2 17 mmol/L.  BUN was 30, creatinine 1.46, calcium 9.1 and glucose 143 mg/dL.  Troponin level was 0.14.  EKG was normal sinus rhythm  Hospital Course:  #1 dyspnea Likely multifactorial secondary to metastatic pancreatic cancer with metastases to the lungs with increased tumor growth noted on CT chest.  CT chest negative for pulmonary embolism.  Patient also noted to have small pleural effusions and advanced emphysema.  Patient status post recent radiation therapy which could also be contributing to her worsening shortness of breath.  Concern for cardiac etiology.  Troponin was mildly elevated and flat which per cardiology was not suggestive of ACS.  2D echo had a EF of 65-70% with no wall motion abnormalities, mild diastolic dysfunction, moderate mitral regurgitation. Patient with sats in the 90s on room air.  On ambulation patient desats into the high 70s and 80s per nursing.  Patient was maintained on home regimen of Lasix, HCTZ, scheduled nebs, Dulera, Toprol-XL. Xarelto was changed to Eliquis due to renal function.  Patient was seen by cardiology and was felt no further additional cardiac testing needed.  Patient was discharged home on home O2.  Outpatient follow-up with PCP and oncology.  2.  COPD/emphysema Remained stable throughout the hospitalization. Patient with advanced COPD per CT chest.  Placed on Dulera, scheduled nebs.  3.  Gastroesophageal reflux disease Patient maintained on Pepcid.    4.  Elevated troponin/chest pain Questionable etiology.  Patient with complaints of chest pain on admission.  Troponin was mildly elevated however seem to have plateaued.  2D echo which was done had a EF of 65-70%, no wall motion abnormalities, grade 1 diastolic dysfunction.  Patient was maintained on home regimen of Toprol-XL, Cozaar, Lasix.  Cardiology consulted and felt that patient's  troponins were flat and not suggestive of ACS.  It was noted that patient did have moderate mitral regurgitation as well.  Recommended continuation of patient's outpatient dose of Lasix.  Per cardiology no further additional cardiac testing needed.    5.  Metastatic pancreatic cancer Patient recently completed 10 rounds of radiation therapy on 03/29/2017.  Patient awaiting to start immunotherapy in the middle of March with her oncologist per patient.    Patient was maintained on Creon.  Outpatient follow-up.    6.  History of right transverse sinus, right sigmoid sinus, right internal jugular vein thrombosis Patient was on Xarelto prior to admission for this.  Due to renal function patient was transitioned to Eliquis.  Outpatient follow-up with oncology.  7.  Anemia Likely anemia of chronic disease.  Patient with no overt bleeding.  Hemoglobin remained stable throughout the hospitalization.   8.  Hypertension Patient was maintained on home regimen of losartan, Lasix, metoprolol, HCTZ.       Procedures:  2D echo 04/12/2017  CT angiogram chest 04/11/2017  Chest x-ray 04/11/2017      Consultations:  Cardiology: Dr. Domenic Polite 04/12/2017      Discharge Exam: Vitals:   04/13/17 0602 04/13/17 0759  BP: 130/64   Pulse: 75   Resp: 18   Temp: 98.3 F (36.8 C)   SpO2: 98% 95%    General: NAD. Frail Cardiovascular: RRR Respiratory: CTAB  Discharge Instructions   Discharge Instructions    Diet - low sodium heart healthy   Complete by:  As directed    Increase activity slowly   Complete by:  As directed      Allergies as of 04/13/2017      Reactions   Oxaliplatin Anaphylaxis   Pantoprazole Other (See Comments)   THIS GIVES THE PATIENT TERRIBLE HEARTBURN (PLEASE DO NOT GIVE)   Adhesive [tape] Rash   Aspirin Other (See Comments)   Burns stomach   Neulasta [pegfilgrastim] Other (See Comments)   UNSURE    Oxycodone Nausea And Vomiting   Tramadol Nausea And  Vomiting      Medication List    STOP taking these medications   GEMZAR IV   XARELTO 20 MG Tabs tablet Generic drug:  rivaroxaban     TAKE these medications   acetaminophen 325 MG tablet Commonly known as:  TYLENOL Take 2 tablets (650 mg total) by mouth every 6 (six) hours as needed for mild pain (or Fever >/= 101).   amoxicillin 500 MG capsule Commonly known as:  AMOXIL Take 2,000 mg by mouth See admin instructions. For Dental Procedures   ANTI-DIARRHEAL 2 MG capsule Generic drug:  loperamide Take 2 mg by mouth 3 (three) times daily with meals. *May take as needed   apixaban 5 MG Tabs tablet Commonly known as:  ELIQUIS Take 1 tablet (5 mg total) by mouth 2 (two) times daily.   apixaban 5 MG Tabs tablet Commonly known as:  ELIQUIS Take 1 tablet (5 mg total) by mouth 2 (two) times daily. Start taking on:  05/14/2017   budesonide-formoterol 80-4.5 MCG/ACT inhaler Commonly known as:  SYMBICORT Inhale 2 puffs into the lungs 2 (two) times daily.   cholecalciferol 1000 units tablet Commonly known as:  VITAMIN D Take 2,000 Units by mouth daily.  CREON 36000 UNITS Cpep capsule Generic drug:  lipase/protease/amylase TAKE 1 CAPSULE THREE TIMES A DAY WITH MEALS   furosemide 20 MG tablet Commonly known as:  LASIX Take 20 mg by mouth daily.   hydrochlorothiazide 25 MG tablet Commonly known as:  HYDRODIURIL Take 12.5 mg by mouth every morning.   losartan 100 MG tablet Commonly known as:  COZAAR Take 100 mg by mouth every morning.   metoprolol succinate 50 MG 24 hr tablet Commonly known as:  TOPROL-XL Take 25 mg by mouth every morning. Take with or immediately following a meal.   naproxen sodium 220 MG tablet Commonly known as:  ALEVE Take 220 mg by mouth daily as needed (for pain).   omeprazole 20 MG capsule Commonly known as:  PRILOSEC Take 20 mg by mouth daily before breakfast.   potassium chloride SA 20 MEQ tablet Commonly known as:  K-DUR,KLOR-CON Take 20  mEq by mouth daily.   tiotropium 18 MCG inhalation capsule Commonly known as:  SPIRIVA HANDIHALER Place 1 capsule (18 mcg total) into inhaler and inhale daily.            Durable Medical Equipment  (From admission, onward)        Start     Ordered   04/12/17 1233  For home use only DME oxygen  Once    Question Answer Comment  Mode or (Route) Nasal cannula   Liters per Minute 2   Frequency Continuous (stationary and portable oxygen unit needed)   Oxygen conserving device Yes   Oxygen delivery system Gas      04/12/17 1233     Allergies  Allergen Reactions  . Oxaliplatin Anaphylaxis  . Pantoprazole Other (See Comments)    THIS GIVES THE PATIENT TERRIBLE HEARTBURN (PLEASE DO NOT GIVE)  . Adhesive [Tape] Rash  . Aspirin Other (See Comments)    Burns stomach  . Neulasta [Pegfilgrastim] Other (See Comments)    UNSURE   . Oxycodone Nausea And Vomiting  . Tramadol Nausea And Vomiting   Follow-up Information    Monico Blitz, MD. Schedule an appointment as soon as possible for a visit in 2 week(s).   Specialty:  Internal Medicine Why:  f/u in 1-2 weeks. Contact information: [address] [place] [address] [phone]        oncologist. Schedule an appointment as soon as possible for a visit in 2 week(s).            The results of significant diagnostics from this hospitalization (including imaging, microbiology, ancillary and laboratory) are listed below for reference.    Significant Diagnostic Studies: Dg Chest 2 View  Result Date: 04/11/2017 CLINICAL DATA:  Shortness of breath. Metastatic pancreatic cancer. History of COPD. EXAM: CHEST  2 VIEW COMPARISON:  CT chest dated February 01, 2017. Chest x-ray dated Jul 20, 2016. FINDINGS: Unchanged right chest wall port catheter with the tip in the distal SVC. The heart size and mediastinal contours are within normal limits. Normal pulmonary vascularity. Postsurgical changes with pleuroparenchymal scarring in both  lungs are grossly unchanged. The lungs remain hyperinflated with severe emphysematous changes. No pleural effusion or pneumothorax. Sclerotic, expansile, left anterior fifth rib mass appears grossly unchanged. Large ventral abdominal hernia. IMPRESSION: 1. No definite acute cardiopulmonary process. COPD. Chronic postsurgical changes and pleuroparenchymal scarring is grossly unchanged. 2. Large, expansile, left anterior fifth rib metastasis is also grossly unchanged. Electronically Signed   By: Titus Dubin M.D.   On: 04/11/2017 16:31   Ct Angio Chest  Pe W And/or Wo Contrast  Result Date: 04/11/2017 CLINICAL DATA:  Shortness of breath. History of pancreatic cancer with metastatic disease to the thorax. EXAM: CT ANGIOGRAPHY CHEST WITH CONTRAST TECHNIQUE: Multidetector CT imaging of the chest was performed using the standard protocol during bolus administration of intravenous contrast. Multiplanar CT image reconstructions and MIPs were obtained to evaluate the vascular anatomy. CONTRAST:  75mL ISOVUE-370 IOPAMIDOL (ISOVUE-370) INJECTION 76% COMPARISON:  Radiograph earlier this day.  Chest CT 02/01/2017 FINDINGS: Cardiovascular: There are no filling defects within the pulmonary arteries to suggest pulmonary embolus. Atherosclerosis of the thoracic aorta without aneurysm or dissection. The heart is normal in size. There are coronary artery calcifications. Small pericardial effusion. Right chest port with tip in the SVC. Mediastinum/Nodes: No pathologically enlarged mediastinal or hilar lymph nodes. Small anterior mediastinal nodes are unchanged from prior exam. There is no axillary adenopathy. Previous right thyroid nodule is not as well-defined on the current exam. The esophagus is decompressed. Lungs/Pleura: Advanced emphysema. Right upper lobe scarring with surgical staple line. Multiple irregularly-shaped partially cavitary lung masses and pulmonary nodules throughout both lungs are again seen. Partially  cavitary irregular posterior right lower lobe mass measures 9.0 x 3.9 cm image 101, increased from a 7.8 x 3.9 cm. Definite increased size and density of the medial basilar right lower lobe irregular nodular opacity image 112 series 7 currently 2.8 x 2.1 cm, previously 2.6 x 1.6 cm. Peripheral left lower lobe pulmonary nodule central cavitation image 84 measures 1.9 x 1.3 cm, previously 1.4 x 0.9 cm. Increased soft tissue density in the lingula subjacent to left rib lesion. Additional smaller nodules throughout both lungs have also increased in size. Small pleural effusions are not significantly changed. No pneumothorax. The trachea and mainstem bronchi are patent. Upper Abdomen: Chronic pneumobilia. Post Whipple procedure with postsurgical change in the pancreas. Musculoskeletal: Expansile left anterior fifth rib lesion is unchanged measuring 2.7 cm. Sclerotic adjacent left anterior sixth rib lesion is unchanged. Remote left posterior rib fracture. Scoliosis and degenerative change in the spine. No evidence of new osseous abnormality. Review of the MIP images confirms the above findings. IMPRESSION: 1. No pulmonary embolus. 2. Progression of intrathoracic metastatic disease with increased size of pulmonary nodules and masses. 3. Expansile left fifth rib lesion is unchanged. 4. Small pleural effusions are unchanged. 5. Advanced emphysema. Aortic atherosclerosis and coronary artery calcifications. Electronically Signed   By: Jeb Levering M.D.   On: 04/11/2017 21:11    Microbiology: No results found for this or any previous visit (from the past 240 hour(s)).   Labs: Basic Metabolic Panel: Recent Labs  Lab 04/11/17 1608 04/12/17 0411 04/13/17 0435  NA 138 136 138  K 4.7 4.5 4.3  CL 111 110 108  CO2 17* 17* 18*  GLUCOSE 143* 107* 95  BUN 30* 27* 28*  CREATININE 1.46* 1.36* 1.29*  CALCIUM 9.1 8.9 8.7*   Liver Function Tests: Recent Labs  Lab 04/12/17 0411  AST 18  ALT 12*  ALKPHOS 119   BILITOT 0.4  PROT 6.2*  ALBUMIN 2.6*   No results for input(s): LIPASE, AMYLASE in the last 168 hours. No results for input(s): AMMONIA in the last 168 hours. CBC: Recent Labs  Lab 04/11/17 1608 04/12/17 0411 04/13/17 0435  WBC 8.1 8.3 10.4  NEUTROABS 6.1  --   --   HGB 9.4* 8.0* 8.5*  HCT 29.7* 25.6* 26.0*  MCV 101.0* 99.6 98.9  PLT 168 124* 151   Cardiac Enzymes: Recent Labs  Lab  04/11/17 2013 04/11/17 2301 04/12/17 0411 04/12/17 0924 04/12/17 1552  TROPONINI 0.14* 0.14* 0.14* 0.16* 0.31*   BNP: BNP (last 3 results) Recent Labs    04/11/17 2301  BNP 306.0*    ProBNP (last 3 results) No results for input(s): PROBNP in the last 8760 hours.  CBG: No results for input(s): GLUCAP in the last 168 hours.     Signed:  Irine Seal MD.  [facility] 04/13/2017, 2:07 PM

## 2017-04-13 NOTE — Progress Notes (Addendum)
Progress Note  Patient Name: [name] [name] Date of Encounter: 04/13/2017  Primary Cardiologist: New to [facility] - Dr. Domenic Polite  Subjective   Breathing improved. No chest pain or palpitations. Noted to have desaturations with ambulation and is to be set-up with Home O2 prior to discharge.   Inpatient Medications    Scheduled Meds: . apixaban  5 mg Oral BID  . cholecalciferol  2,000 Units Oral Daily  . famotidine  20 mg Oral Daily  . feeding supplement (ENSURE ENLIVE)  237 mL Oral BID BM  . furosemide  20 mg Oral Daily  . hydrochlorothiazide  12.5 mg Oral q morning - 10a  . ipratropium-albuterol  3 mL Nebulization TID  . lipase/protease/amylase  36,000 Units Oral TID AC  . loperamide  2 mg Oral TID WC  . losartan  100 mg Oral q morning - 10a  . mouth rinse  15 mL Mouth Rinse q12n4p  . metoprolol succinate  25 mg Oral q morning - 10a  . mometasone-formoterol  2 puff Inhalation BID  . potassium chloride SA  20 mEq Oral Daily    PRN Meds: acetaminophen, ipratropium-albuterol   Vital Signs    Vitals:   04/12/17 2027 04/12/17 2102 04/13/17 0602 04/13/17 0759  BP: (!) 168/80  130/64   Pulse: 93  75   Resp: 20  18   Temp: 98.1 F (36.7 C)  98.3 F (36.8 C)   TempSrc: Oral  Oral   SpO2: 99% 96% 98% 95%  Weight:   112 lb 7 oz (51 kg)   Height:        Intake/Output Summary (Last 24 hours) at 04/13/2017 0951 Last data filed at 04/13/2017 0602 Gross per 24 hour  Intake 300 ml  Output 800 ml  Net -500 ml   Filed Weights   04/11/17 1602 04/13/17 0602  Weight: 108 lb (49 kg) 112 lb 7 oz (51 kg)    Telemetry    NSR, HR in 80's to low-100's with frequent PAC's.  - Personally Reviewed  ECG    No new tracings.   Physical Exam   General: Well developed, thin-Caucasian female appearing in no acute distress. Head: Normocephalic, atraumatic.  Neck: Supple without bruits, JVD not elevated. Lungs:  Resp regular and unlabored, CTA without wheezing or rales. Heart: RRR,  S1, S2, no S3, S4, no rub. 2/6 SEM along Apex.  Abdomen: Soft, non-tender, non-distended with normoactive bowel sounds. No hepatomegaly. No rebound/guarding. No obvious abdominal masses. Extremities: No clubbing or cyanosis, trace edema. Distal pedal pulses are 2+ bilaterally. Neuro: Alert and oriented X 3. Moves all extremities spontaneously. Psych: Normal affect.  Labs    Chemistry Recent Labs  Lab 04/11/17 1608 04/12/17 0411 04/13/17 0435  NA 138 136 138  K 4.7 4.5 4.3  CL 111 110 108  CO2 17* 17* 18*  GLUCOSE 143* 107* 95  BUN 30* 27* 28*  CREATININE 1.46* 1.36* 1.29*  CALCIUM 9.1 8.9 8.7*  PROT  --  6.2*  --   ALBUMIN  --  2.6*  --   AST  --  18  --   ALT  --  12*  --   ALKPHOS  --  119  --   BILITOT  --  0.4  --   GFRNONAA 33* 36* 39*  GFRAA 39* 42* 45*  ANIONGAP 10 9 12      Hematology Recent Labs  Lab 04/11/17 1608 04/12/17 0411 04/12/17 0924 04/13/17 0435  WBC 8.1 8.3  --  10.4  RBC 2.94* 2.57* 2.94* 2.63*  HGB 9.4* 8.0*  --  8.5*  HCT 29.7* 25.6*  --  26.0*  MCV 101.0* 99.6  --  98.9  MCH 32.0 31.1  --  32.3  MCHC 31.6 31.3  --  32.7  RDW 15.1 14.1  --  14.8  PLT 168 124*  --  151    Cardiac Enzymes Recent Labs  Lab 04/11/17 2301 04/12/17 0411 04/12/17 0924 04/12/17 1552  TROPONINI 0.14* 0.14* 0.16* 0.31*   No results for input(s): TROPIPOC in the last 168 hours.   BNP Recent Labs  Lab 04/11/17 2301  BNP 306.0*     DDimer No results for input(s): DDIMER in the last 168 hours.   Radiology    Dg Chest 2 View  Result Date: 04/11/2017 CLINICAL DATA:  Shortness of breath. Metastatic pancreatic cancer. History of COPD. EXAM: CHEST  2 VIEW COMPARISON:  CT chest dated February 01, 2017. Chest x-ray dated Jul 20, 2016. FINDINGS: Unchanged right chest wall port catheter with the tip in the distal SVC. The heart size and mediastinal contours are within normal limits. Normal pulmonary vascularity. Postsurgical changes with pleuroparenchymal  scarring in both lungs are grossly unchanged. The lungs remain hyperinflated with severe emphysematous changes. No pleural effusion or pneumothorax. Sclerotic, expansile, left anterior fifth rib mass appears grossly unchanged. Large ventral abdominal hernia. IMPRESSION: 1. No definite acute cardiopulmonary process. COPD. Chronic postsurgical changes and pleuroparenchymal scarring is grossly unchanged. 2. Large, expansile, left anterior fifth rib metastasis is also grossly unchanged. Electronically Signed   By: Titus Dubin M.D.   On: 04/11/2017 16:31   Ct Angio Chest Pe W And/or Wo Contrast  Result Date: 04/11/2017 CLINICAL DATA:  Shortness of breath. History of pancreatic cancer with metastatic disease to the thorax. EXAM: CT ANGIOGRAPHY CHEST WITH CONTRAST TECHNIQUE: Multidetector CT imaging of the chest was performed using the standard protocol during bolus administration of intravenous contrast. Multiplanar CT image reconstructions and MIPs were obtained to evaluate the vascular anatomy. CONTRAST:  70mL ISOVUE-370 IOPAMIDOL (ISOVUE-370) INJECTION 76% COMPARISON:  Radiograph earlier this day.  Chest CT 02/01/2017 FINDINGS: Cardiovascular: There are no filling defects within the pulmonary arteries to suggest pulmonary embolus. Atherosclerosis of the thoracic aorta without aneurysm or dissection. The heart is normal in size. There are coronary artery calcifications. Small pericardial effusion. Right chest port with tip in the SVC. Mediastinum/Nodes: No pathologically enlarged mediastinal or hilar lymph nodes. Small anterior mediastinal nodes are unchanged from prior exam. There is no axillary adenopathy. Previous right thyroid nodule is not as well-defined on the current exam. The esophagus is decompressed. Lungs/Pleura: Advanced emphysema. Right upper lobe scarring with surgical staple line. Multiple irregularly-shaped partially cavitary lung masses and pulmonary nodules throughout both lungs are again  seen. Partially cavitary irregular posterior right lower lobe mass measures 9.0 x 3.9 cm image 101, increased from a 7.8 x 3.9 cm. Definite increased size and density of the medial basilar right lower lobe irregular nodular opacity image 112 series 7 currently 2.8 x 2.1 cm, previously 2.6 x 1.6 cm. Peripheral left lower lobe pulmonary nodule central cavitation image 84 measures 1.9 x 1.3 cm, previously 1.4 x 0.9 cm. Increased soft tissue density in the lingula subjacent to left rib lesion. Additional smaller nodules throughout both lungs have also increased in size. Small pleural effusions are not significantly changed. No pneumothorax. The trachea and mainstem bronchi are patent. Upper Abdomen: Chronic pneumobilia. Post Whipple procedure with postsurgical change in the pancreas.  Musculoskeletal: Expansile left anterior fifth rib lesion is unchanged measuring 2.7 cm. Sclerotic adjacent left anterior sixth rib lesion is unchanged. Remote left posterior rib fracture. Scoliosis and degenerative change in the spine. No evidence of new osseous abnormality. Review of the MIP images confirms the above findings. IMPRESSION: 1. No pulmonary embolus. 2. Progression of intrathoracic metastatic disease with increased size of pulmonary nodules and masses. 3. Expansile left fifth rib lesion is unchanged. 4. Small pleural effusions are unchanged. 5. Advanced emphysema. Aortic atherosclerosis and coronary artery calcifications. Electronically Signed   By: Jeb Levering M.D.   On: 04/11/2017 21:11    Cardiac Studies   Echocardiogram: 04/12/2017 Study Conclusions  - Left ventricle: The cavity size was normal. Wall thickness was   increased in a pattern of mild LVH. Systolic function was   vigorous. The estimated ejection fraction was in the range of 65%   to 70%. Wall motion was normal; there were no regional wall   motion abnormalities. Doppler parameters are consistent with   abnormal left ventricular relaxation  (grade 1 diastolic   dysfunction). - Aortic valve: Mildly to moderately calcified annulus. Trileaflet. - Mitral valve: Moderately calcified annulus. Mildly thickened   leaflets . There was moderate regurgitation. Valve area by   pressure half-time: 1.63 cm^2. - Right atrium: Central venous pressure (est): 3 mm Hg. - Tricuspid valve: There was mild regurgitation. - Pulmonary arteries: PA peak pressure: 47 mm Hg (S). - Pericardium, extracardiac: A prominent pericardial fat pad was   present.   Patient Profile     76 y.o. female w/ PMH of metastatic pancreatic cancer, COPD, GERD, HTN, HLD, AAA (4.0 cm by CT imaging in 01/2017), and coronary calcifications by CT who presented to [facility] on 04/11/2017 for evaluation of progressive dyspnea on exertion.   Assessment & Plan    1. Dyspnea on Exertion - Thought to be multifactorial in the setting of her metastatic pancreatic cancer with metastases to the lungs and known emphysema.  BNP was elevated to 306 on admission. - Echocardiogram was obtained and showed a preserved EF of 65-70% with no regional wall motion abnormalities grade 1 diastolic dysfunction. - She does not appear volume overloaded by physical examination. Would continue on PTA Lasix 20mg  daily. Sodium restriction was reviewed as she reports consuming potato chips regularly.  2. COPD/ Emphysema - per admitting team. Has been started on Dulera.   3. Elevated Troponin - cyclic troponin values have been flat at 0.14, 0.14, 0.16, and 0.31. - Echocardiogram shows a preserved EF with no regional wall motion abnormalities. No plans for further ischemic evaluation at this time in the setting of her multiple comorbidities. - continue BB therapy. No ASA secondary to the need for anticoagulation (says she was placed on anticoagulation previously due to a clot in her neck and brain).   4. Mitral regurgitation - moderate by most recent echo. Continue with diuretic therapy as above.   For  questions or updates, please contact [place] Please consult [email or web address] for contact info under Cardiology/STEMI.   Arna Medici , PA-C 9:51 AM 04/13/2017 Pager: [phone]   Attending note:  Patient seen and examined.  Reviewed interval hospital course as well as echocardiogram.  Patient reports no escalation in symptoms and was noted to have ambulatory hypoxemia with plan for home oxygen.  Flat troponin I levels are not suggestive of ACS.  LVEF is preserved at 65-70% with no regional wall motion abnormalities and mild diastolic dysfunction.  She does  have moderate mitral regurgitation as well.  Would recommend continuing prior outpatient dose of Lasix.  No additional cardiac testing is planned at this time.  We will sign off.  Satira Sark, M.D., F.A.C.C.

## 2017-04-16 DIAGNOSIS — C259 Malignant neoplasm of pancreas, unspecified: Secondary | ICD-10-CM

## 2017-04-22 DIAGNOSIS — E7800 Pure hypercholesterolemia, unspecified: Secondary | ICD-10-CM | POA: Diagnosis present

## 2017-04-22 DIAGNOSIS — Z79899 Other long term (current) drug therapy: Secondary | ICD-10-CM | POA: Diagnosis not present

## 2017-04-22 DIAGNOSIS — I69351 Hemiplegia and hemiparesis following cerebral infarction affecting right dominant side: Secondary | ICD-10-CM | POA: Diagnosis not present

## 2017-04-22 DIAGNOSIS — M1990 Unspecified osteoarthritis, unspecified site: Secondary | ICD-10-CM | POA: Diagnosis not present

## 2017-04-22 DIAGNOSIS — R4781 Slurred speech: Secondary | ICD-10-CM | POA: Diagnosis present

## 2017-04-22 DIAGNOSIS — I6789 Other cerebrovascular disease: Secondary | ICD-10-CM | POA: Diagnosis not present

## 2017-04-22 DIAGNOSIS — M6281 Muscle weakness (generalized): Secondary | ICD-10-CM | POA: Diagnosis not present

## 2017-04-22 DIAGNOSIS — I4891 Unspecified atrial fibrillation: Secondary | ICD-10-CM | POA: Diagnosis present

## 2017-04-22 DIAGNOSIS — C259 Malignant neoplasm of pancreas, unspecified: Secondary | ICD-10-CM | POA: Diagnosis present

## 2017-04-22 DIAGNOSIS — Z91048 Other nonmedicinal substance allergy status: Secondary | ICD-10-CM | POA: Diagnosis not present

## 2017-04-22 DIAGNOSIS — Z66 Do not resuscitate: Secondary | ICD-10-CM | POA: Diagnosis present

## 2017-04-22 DIAGNOSIS — E785 Hyperlipidemia, unspecified: Secondary | ICD-10-CM | POA: Diagnosis not present

## 2017-04-22 DIAGNOSIS — I63512 Cerebral infarction due to unspecified occlusion or stenosis of left middle cerebral artery: Secondary | ICD-10-CM | POA: Diagnosis not present

## 2017-04-22 DIAGNOSIS — I69328 Other speech and language deficits following cerebral infarction: Secondary | ICD-10-CM | POA: Diagnosis not present

## 2017-04-22 DIAGNOSIS — G8191 Hemiplegia, unspecified affecting right dominant side: Secondary | ICD-10-CM | POA: Diagnosis present

## 2017-04-22 DIAGNOSIS — Z7901 Long term (current) use of anticoagulants: Secondary | ICD-10-CM | POA: Diagnosis not present

## 2017-04-22 DIAGNOSIS — Z886 Allergy status to analgesic agent status: Secondary | ICD-10-CM | POA: Diagnosis not present

## 2017-04-22 DIAGNOSIS — J439 Emphysema, unspecified: Secondary | ICD-10-CM | POA: Diagnosis present

## 2017-04-22 DIAGNOSIS — K219 Gastro-esophageal reflux disease without esophagitis: Secondary | ICD-10-CM | POA: Diagnosis present

## 2017-04-22 DIAGNOSIS — Z888 Allergy status to other drugs, medicaments and biological substances status: Secondary | ICD-10-CM | POA: Diagnosis not present

## 2017-04-22 DIAGNOSIS — R29818 Other symptoms and signs involving the nervous system: Secondary | ICD-10-CM | POA: Diagnosis not present

## 2017-04-22 DIAGNOSIS — I63412 Cerebral infarction due to embolism of left middle cerebral artery: Secondary | ICD-10-CM | POA: Diagnosis present

## 2017-04-22 DIAGNOSIS — Z85118 Personal history of other malignant neoplasm of bronchus and lung: Secondary | ICD-10-CM | POA: Diagnosis not present

## 2017-04-22 DIAGNOSIS — I10 Essential (primary) hypertension: Secondary | ICD-10-CM | POA: Diagnosis not present

## 2017-04-22 DIAGNOSIS — Z87891 Personal history of nicotine dependence: Secondary | ICD-10-CM | POA: Diagnosis not present

## 2017-04-22 DIAGNOSIS — I639 Cerebral infarction, unspecified: Secondary | ICD-10-CM | POA: Diagnosis not present

## 2017-04-22 DIAGNOSIS — I69354 Hemiplegia and hemiparesis following cerebral infarction affecting left non-dominant side: Secondary | ICD-10-CM | POA: Diagnosis not present

## 2017-04-22 DIAGNOSIS — R29810 Facial weakness: Secondary | ICD-10-CM | POA: Diagnosis not present

## 2017-04-22 DIAGNOSIS — R4701 Aphasia: Secondary | ICD-10-CM | POA: Diagnosis not present

## 2017-04-22 DIAGNOSIS — R1312 Dysphagia, oropharyngeal phase: Secondary | ICD-10-CM | POA: Diagnosis not present

## 2017-04-25 ENCOUNTER — Encounter: Payer: Self-pay | Admitting: *Deleted

## 2017-04-25 NOTE — Progress Notes (Signed)
76 Called left a message on the answering machine that Dr. Trenton Gammon PA-C and staff were sorry to hear of [name] [name] recent stroke.  We would be happy to see her back once she recovers.  Feel free to call and reschedule her appointment when she is ready at [phone].

## 2017-04-26 ENCOUNTER — Inpatient Hospital Stay: Admission: RE | Admit: 2017-04-26 | Payer: Self-pay | Source: Ambulatory Visit | Admitting: Urology

## 2017-04-26 DIAGNOSIS — Z8507 Personal history of malignant neoplasm of pancreas: Secondary | ICD-10-CM | POA: Diagnosis not present

## 2017-04-26 DIAGNOSIS — I63512 Cerebral infarction due to unspecified occlusion or stenosis of left middle cerebral artery: Secondary | ICD-10-CM | POA: Diagnosis present

## 2017-04-26 DIAGNOSIS — G8191 Hemiplegia, unspecified affecting right dominant side: Secondary | ICD-10-CM | POA: Diagnosis present

## 2017-04-26 DIAGNOSIS — I63412 Cerebral infarction due to embolism of left middle cerebral artery: Secondary | ICD-10-CM | POA: Diagnosis not present

## 2017-04-26 DIAGNOSIS — Z87891 Personal history of nicotine dependence: Secondary | ICD-10-CM | POA: Diagnosis not present

## 2017-04-26 DIAGNOSIS — I10 Essential (primary) hypertension: Secondary | ICD-10-CM | POA: Diagnosis present

## 2017-04-26 DIAGNOSIS — J439 Emphysema, unspecified: Secondary | ICD-10-CM | POA: Diagnosis present

## 2017-04-26 DIAGNOSIS — E785 Hyperlipidemia, unspecified: Secondary | ICD-10-CM | POA: Diagnosis not present

## 2017-04-26 DIAGNOSIS — K219 Gastro-esophageal reflux disease without esophagitis: Secondary | ICD-10-CM | POA: Diagnosis present

## 2017-04-26 DIAGNOSIS — J690 Pneumonitis due to inhalation of food and vomit: Secondary | ICD-10-CM | POA: Diagnosis present

## 2017-04-26 DIAGNOSIS — R1310 Dysphagia, unspecified: Secondary | ICD-10-CM | POA: Diagnosis present

## 2017-04-26 DIAGNOSIS — I639 Cerebral infarction, unspecified: Secondary | ICD-10-CM | POA: Diagnosis not present

## 2017-04-26 DIAGNOSIS — Z66 Do not resuscitate: Secondary | ICD-10-CM | POA: Diagnosis present

## 2017-04-26 DIAGNOSIS — M1990 Unspecified osteoarthritis, unspecified site: Secondary | ICD-10-CM | POA: Diagnosis not present

## 2017-04-26 DIAGNOSIS — R918 Other nonspecific abnormal finding of lung field: Secondary | ICD-10-CM | POA: Diagnosis not present

## 2017-04-26 DIAGNOSIS — I69328 Other speech and language deficits following cerebral infarction: Secondary | ICD-10-CM | POA: Diagnosis not present

## 2017-04-26 DIAGNOSIS — Z85118 Personal history of other malignant neoplasm of bronchus and lung: Secondary | ICD-10-CM | POA: Diagnosis not present

## 2017-04-26 DIAGNOSIS — Z7901 Long term (current) use of anticoagulants: Secondary | ICD-10-CM | POA: Diagnosis not present

## 2017-04-26 DIAGNOSIS — J181 Lobar pneumonia, unspecified organism: Secondary | ICD-10-CM | POA: Diagnosis not present

## 2017-04-26 DIAGNOSIS — R4701 Aphasia: Secondary | ICD-10-CM | POA: Diagnosis present

## 2017-04-26 DIAGNOSIS — Z8541 Personal history of malignant neoplasm of cervix uteri: Secondary | ICD-10-CM | POA: Diagnosis not present

## 2017-04-26 DIAGNOSIS — I69992 Facial weakness following unspecified cerebrovascular disease: Secondary | ICD-10-CM | POA: Diagnosis not present

## 2017-04-26 DIAGNOSIS — N390 Urinary tract infection, site not specified: Secondary | ICD-10-CM | POA: Diagnosis present

## 2017-04-26 DIAGNOSIS — R1312 Dysphagia, oropharyngeal phase: Secondary | ICD-10-CM | POA: Diagnosis not present

## 2017-04-26 DIAGNOSIS — M6281 Muscle weakness (generalized): Secondary | ICD-10-CM | POA: Diagnosis not present

## 2017-04-26 DIAGNOSIS — R29818 Other symptoms and signs involving the nervous system: Secondary | ICD-10-CM | POA: Diagnosis not present

## 2017-04-26 DIAGNOSIS — I4891 Unspecified atrial fibrillation: Secondary | ICD-10-CM | POA: Diagnosis not present

## 2017-04-26 DIAGNOSIS — I69351 Hemiplegia and hemiparesis following cerebral infarction affecting right dominant side: Secondary | ICD-10-CM | POA: Diagnosis not present

## 2017-04-26 DIAGNOSIS — R531 Weakness: Secondary | ICD-10-CM | POA: Diagnosis not present

## 2017-04-26 DIAGNOSIS — C259 Malignant neoplasm of pancreas, unspecified: Secondary | ICD-10-CM | POA: Diagnosis not present

## 2017-04-26 DIAGNOSIS — I6789 Other cerebrovascular disease: Secondary | ICD-10-CM | POA: Diagnosis not present

## 2017-04-26 DIAGNOSIS — Z79899 Other long term (current) drug therapy: Secondary | ICD-10-CM | POA: Diagnosis not present

## 2017-04-28 DIAGNOSIS — J181 Lobar pneumonia, unspecified organism: Secondary | ICD-10-CM | POA: Diagnosis not present

## 2017-04-28 DIAGNOSIS — N390 Urinary tract infection, site not specified: Secondary | ICD-10-CM | POA: Diagnosis not present

## 2017-04-28 DIAGNOSIS — I63412 Cerebral infarction due to embolism of left middle cerebral artery: Secondary | ICD-10-CM | POA: Diagnosis not present

## 2017-04-28 DIAGNOSIS — R1312 Dysphagia, oropharyngeal phase: Secondary | ICD-10-CM | POA: Diagnosis not present

## 2017-05-04 DIAGNOSIS — Z87891 Personal history of nicotine dependence: Secondary | ICD-10-CM | POA: Diagnosis not present

## 2017-05-04 DIAGNOSIS — I63512 Cerebral infarction due to unspecified occlusion or stenosis of left middle cerebral artery: Secondary | ICD-10-CM | POA: Diagnosis present

## 2017-05-04 DIAGNOSIS — R29818 Other symptoms and signs involving the nervous system: Secondary | ICD-10-CM | POA: Diagnosis not present

## 2017-05-04 DIAGNOSIS — Z79899 Other long term (current) drug therapy: Secondary | ICD-10-CM | POA: Diagnosis not present

## 2017-05-04 DIAGNOSIS — N390 Urinary tract infection, site not specified: Secondary | ICD-10-CM | POA: Diagnosis not present

## 2017-05-04 DIAGNOSIS — Z85118 Personal history of other malignant neoplasm of bronchus and lung: Secondary | ICD-10-CM | POA: Diagnosis not present

## 2017-05-04 DIAGNOSIS — I639 Cerebral infarction, unspecified: Secondary | ICD-10-CM | POA: Diagnosis not present

## 2017-05-04 DIAGNOSIS — J690 Pneumonitis due to inhalation of food and vomit: Secondary | ICD-10-CM | POA: Diagnosis present

## 2017-05-04 DIAGNOSIS — R531 Weakness: Secondary | ICD-10-CM | POA: Diagnosis not present

## 2017-05-04 DIAGNOSIS — G8191 Hemiplegia, unspecified affecting right dominant side: Secondary | ICD-10-CM | POA: Diagnosis present

## 2017-05-04 DIAGNOSIS — Z8541 Personal history of malignant neoplasm of cervix uteri: Secondary | ICD-10-CM | POA: Diagnosis not present

## 2017-05-04 DIAGNOSIS — Z8507 Personal history of malignant neoplasm of pancreas: Secondary | ICD-10-CM | POA: Diagnosis not present

## 2017-05-04 DIAGNOSIS — R4701 Aphasia: Secondary | ICD-10-CM | POA: Diagnosis present

## 2017-05-04 DIAGNOSIS — R1310 Dysphagia, unspecified: Secondary | ICD-10-CM | POA: Diagnosis not present

## 2017-05-04 DIAGNOSIS — J181 Lobar pneumonia, unspecified organism: Secondary | ICD-10-CM | POA: Diagnosis not present

## 2017-05-04 DIAGNOSIS — I10 Essential (primary) hypertension: Secondary | ICD-10-CM | POA: Diagnosis present

## 2017-05-04 DIAGNOSIS — Z66 Do not resuscitate: Secondary | ICD-10-CM | POA: Diagnosis present

## 2017-05-04 DIAGNOSIS — Z7901 Long term (current) use of anticoagulants: Secondary | ICD-10-CM | POA: Diagnosis not present

## 2017-05-04 DIAGNOSIS — I69992 Facial weakness following unspecified cerebrovascular disease: Secondary | ICD-10-CM | POA: Diagnosis not present

## 2017-05-04 DIAGNOSIS — J439 Emphysema, unspecified: Secondary | ICD-10-CM | POA: Diagnosis present

## 2017-05-04 DIAGNOSIS — R918 Other nonspecific abnormal finding of lung field: Secondary | ICD-10-CM | POA: Diagnosis not present

## 2017-05-04 DIAGNOSIS — K219 Gastro-esophageal reflux disease without esophagitis: Secondary | ICD-10-CM | POA: Diagnosis present

## 2017-05-04 DIAGNOSIS — I6789 Other cerebrovascular disease: Secondary | ICD-10-CM | POA: Diagnosis not present

## 2017-05-19 ENCOUNTER — Other Ambulatory Visit: Payer: Self-pay | Admitting: Nurse Practitioner

## 2017-05-22 DEATH — deceased

## 2017-09-13 IMAGING — CR DG CHEST 1V PORT
1 series · 1 of 1 positions shown · non-contrast
Comparison: 03/17/2016

CLINICAL DATA: Follow-up pneumothorax

EXAM:
PORTABLE CHEST 1 VIEW

[portable]
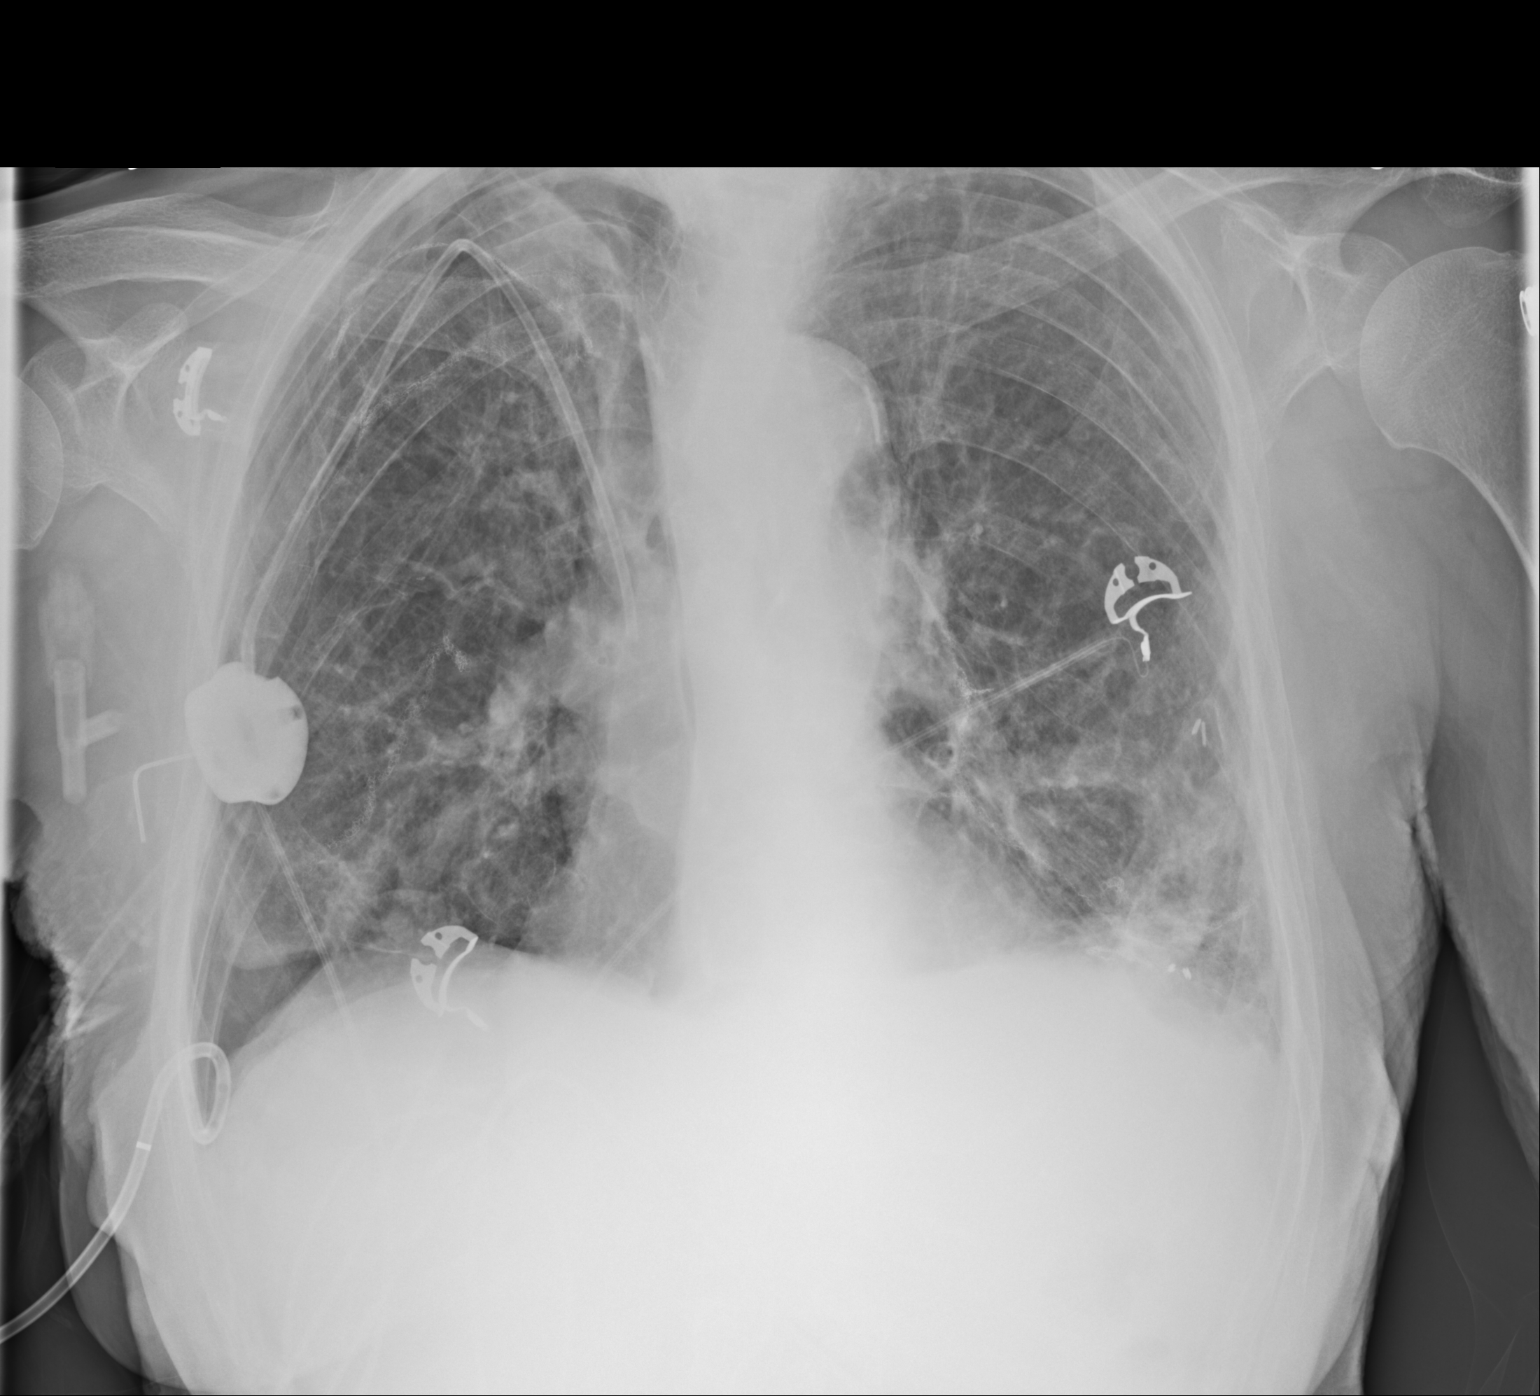

[1 of 1 positions shown; findings below may reference images not displayed]

FINDINGS: Cardiomediastinal silhouette is stable. Right IJ Port-A-Cath is
unchanged in position. Stable postsurgical changes right lung.
Stable pleuroparenchymal thickening right upper lobe. No pulmonary
edema. Stable atelectasis infiltrate or scarring left base. Right
base laterally pleural catheter is unchanged in position. Stable
small right basilar and right base medially pneumothorax.
IMPRESSION: Right IJ Port-A-Cath is unchanged in position. Stable postsurgical
changes right lung. Stable pleuroparenchymal thickening right upper
lobe. No pulmonary edema. Stable atelectasis infiltrate or scarring
left base. Right base laterally pleural catheter is unchanged in
position. Stable small right basilar and right base medially
pneumothorax.

## 2017-09-18 IMAGING — DX DG CHEST 2V
2 series · 2 of 2 positions shown · non-contrast
Comparison: 03/18/2016

CLINICAL DATA: Right-sided pneumothorax, followup examination

EXAM:
CHEST  2 VIEW

[dg chest 2 view (1 of 2)]
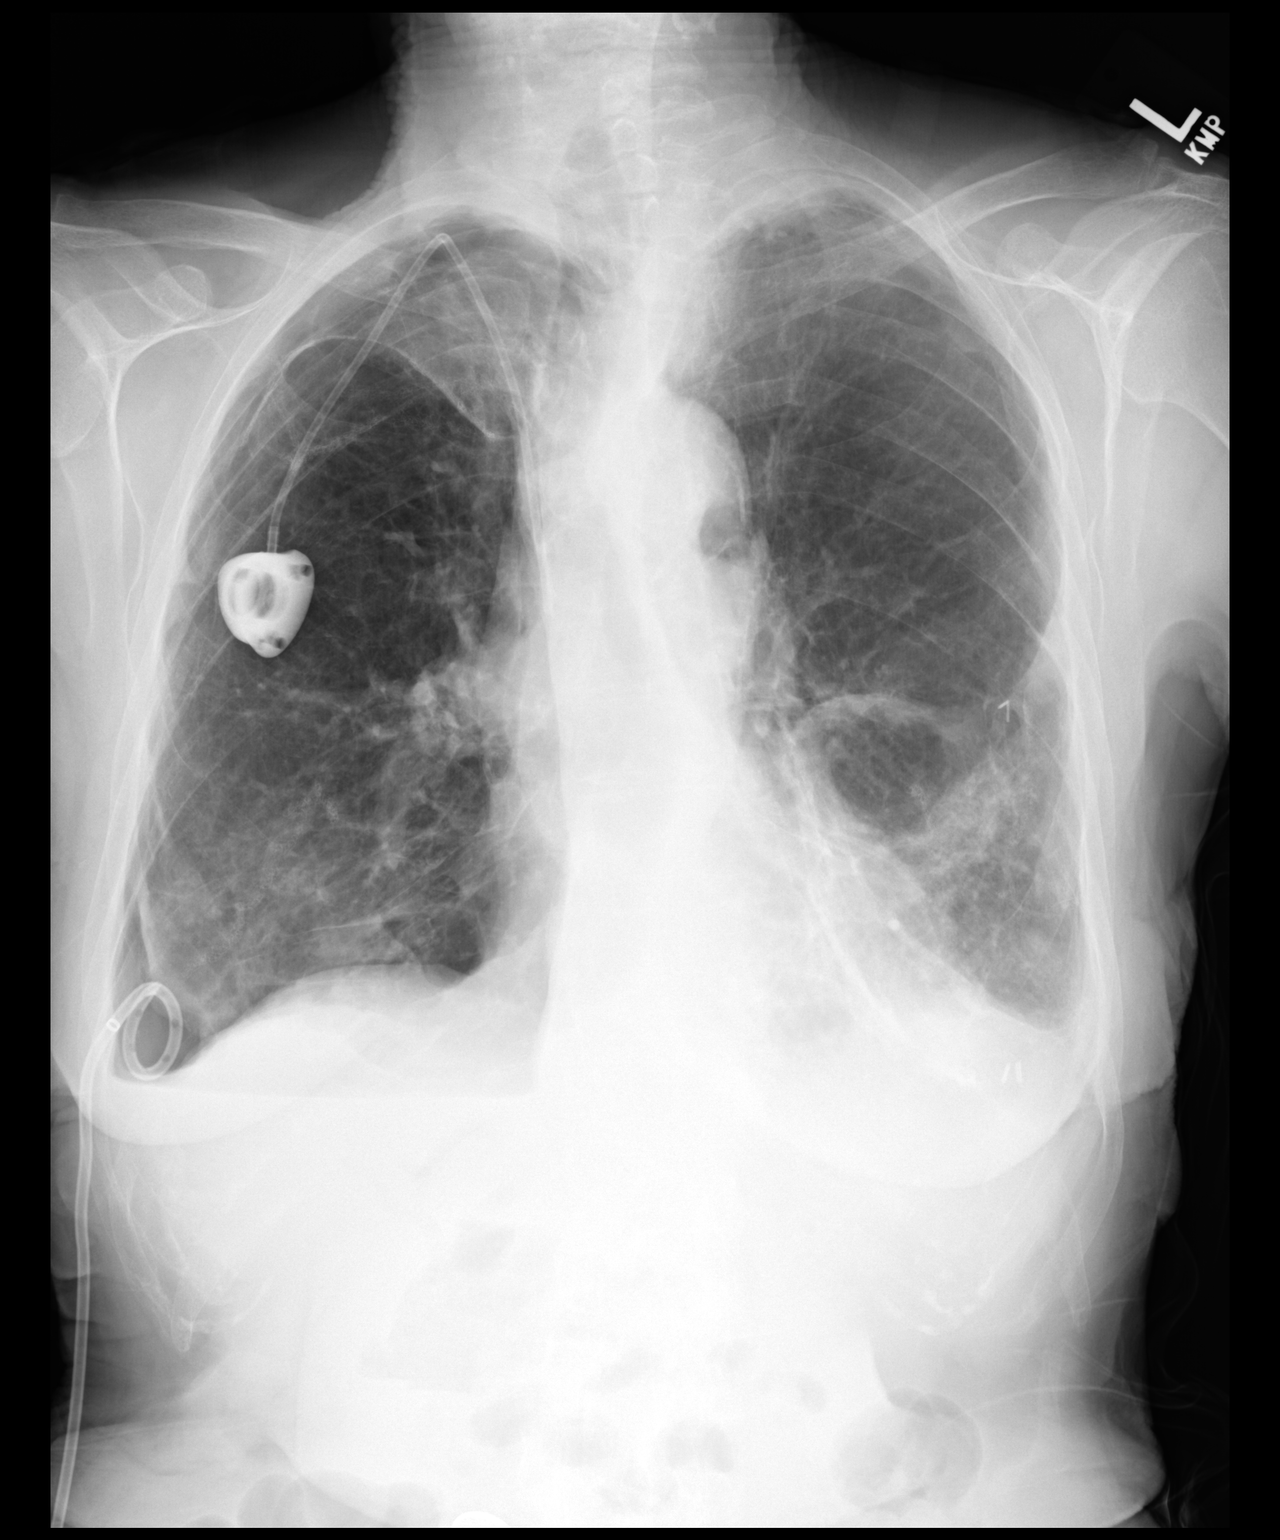

[dg chest 2 view (2 of 2)]
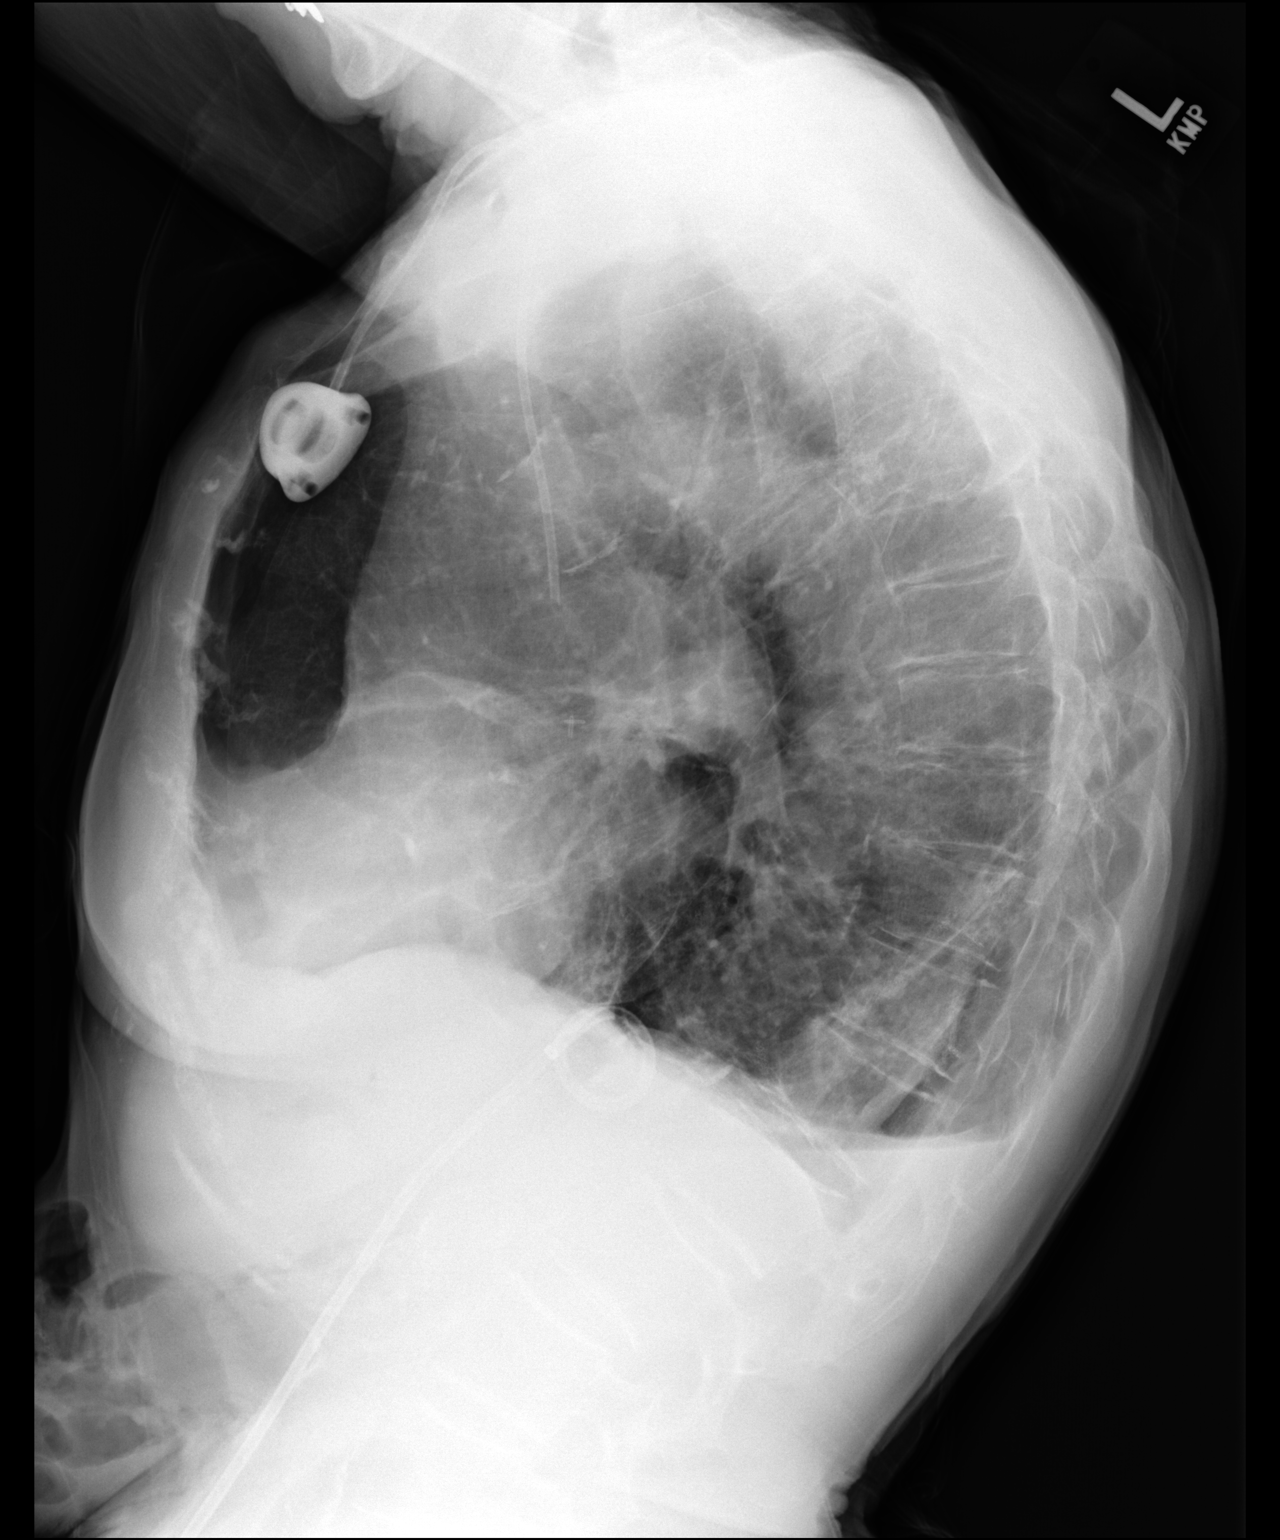

[2 of 2 positions shown; findings below may reference images not displayed]

FINDINGS: Right chest wall port is again seen and stable. Chest tube is again
noted on the right in the lateral lung base. Residual
hydropneumothorax is noted. The pleural fluid component is new from
the prior exam. Postsurgical changes in the right apex are noted.
Chronic changes in the left base are seen.
IMPRESSION: Stable right pneumothorax with new hydropneumothorax component
inferiorly.
